# Patient Record
Sex: Female | Born: 1940
Health system: Southern US, Community
[De-identification: ages and names within clinical notes are randomized; demographics above are authoritative.]

## PROBLEM LIST (undated history)

## (undated) DIAGNOSIS — G629 Polyneuropathy, unspecified: Secondary | ICD-10-CM

## (undated) DIAGNOSIS — I10 Essential (primary) hypertension: Secondary | ICD-10-CM

## (undated) DIAGNOSIS — E559 Vitamin D deficiency, unspecified: Secondary | ICD-10-CM

## (undated) DIAGNOSIS — Z96652 Presence of left artificial knee joint: Secondary | ICD-10-CM

## (undated) DIAGNOSIS — K219 Gastro-esophageal reflux disease without esophagitis: Secondary | ICD-10-CM

## (undated) DIAGNOSIS — K5731 Diverticulosis of large intestine without perforation or abscess with bleeding: Secondary | ICD-10-CM

## (undated) DIAGNOSIS — F419 Anxiety disorder, unspecified: Secondary | ICD-10-CM

## (undated) DIAGNOSIS — G4730 Sleep apnea, unspecified: Secondary | ICD-10-CM

## (undated) DIAGNOSIS — E538 Deficiency of other specified B group vitamins: Secondary | ICD-10-CM

## (undated) DIAGNOSIS — J449 Chronic obstructive pulmonary disease, unspecified: Secondary | ICD-10-CM

## (undated) DIAGNOSIS — G2581 Restless legs syndrome: Secondary | ICD-10-CM

## (undated) DIAGNOSIS — K5790 Diverticulosis of intestine, part unspecified, without perforation or abscess without bleeding: Secondary | ICD-10-CM

## (undated) DIAGNOSIS — Z86010 Personal history of colonic polyps: Secondary | ICD-10-CM

## (undated) DIAGNOSIS — Z860101 Personal history of adenomatous and serrated colon polyps: Secondary | ICD-10-CM

## (undated) DIAGNOSIS — R7303 Prediabetes: Secondary | ICD-10-CM

## (undated) DIAGNOSIS — E785 Hyperlipidemia, unspecified: Secondary | ICD-10-CM

## (undated) HISTORY — DX: Vitamin D deficiency, unspecified: E55.9

## (undated) HISTORY — DX: Chronic obstructive pulmonary disease, unspecified: J44.9

## (undated) HISTORY — DX: Gastro-esophageal reflux disease without esophagitis: K21.9

## (undated) HISTORY — DX: Personal history of adenomatous and serrated colon polyps: Z86.0101

## (undated) HISTORY — PX: APPENDECTOMY: SHX54

## (undated) HISTORY — DX: Anxiety disorder, unspecified: F41.9

## (undated) HISTORY — PX: TONSILLECTOMY: SUR1361

## (undated) HISTORY — DX: Presence of left artificial knee joint: Z96.652

## (undated) HISTORY — DX: Hyperlipidemia, unspecified: E78.5

## (undated) HISTORY — DX: Prediabetes: R73.03

## (undated) HISTORY — DX: Deficiency of other specified B group vitamins: E53.8

## (undated) HISTORY — DX: Diverticulosis of large intestine without perforation or abscess with bleeding: K57.31

## (undated) HISTORY — DX: Personal history of colonic polyps: Z86.010

## (undated) HISTORY — DX: Restless legs syndrome: G25.81

## (undated) HISTORY — DX: Diverticulosis of intestine, part unspecified, without perforation or abscess without bleeding: K57.90

---

## 1998-01-11 ENCOUNTER — Ambulatory Visit (HOSPITAL_COMMUNITY): Admission: RE | Admit: 1998-01-11 | Discharge: 1998-01-11 | Payer: Self-pay | Admitting: Obstetrics and Gynecology

## 1998-02-08 ENCOUNTER — Other Ambulatory Visit: Admission: RE | Admit: 1998-02-08 | Discharge: 1998-02-08 | Payer: Self-pay | Admitting: *Deleted

## 1999-01-24 ENCOUNTER — Ambulatory Visit (HOSPITAL_COMMUNITY): Admission: RE | Admit: 1999-01-24 | Discharge: 1999-01-24 | Payer: Self-pay | Admitting: Internal Medicine

## 1999-01-24 ENCOUNTER — Encounter: Payer: Self-pay | Admitting: Internal Medicine

## 1999-02-21 ENCOUNTER — Other Ambulatory Visit: Admission: RE | Admit: 1999-02-21 | Discharge: 1999-02-21 | Payer: Self-pay | Admitting: *Deleted

## 2000-01-29 ENCOUNTER — Encounter: Payer: Self-pay | Admitting: Internal Medicine

## 2000-01-29 ENCOUNTER — Ambulatory Visit (HOSPITAL_COMMUNITY): Admission: RE | Admit: 2000-01-29 | Discharge: 2000-01-29 | Payer: Self-pay | Admitting: Internal Medicine

## 2000-03-19 ENCOUNTER — Other Ambulatory Visit: Admission: RE | Admit: 2000-03-19 | Discharge: 2000-03-19 | Payer: Self-pay | Admitting: *Deleted

## 2001-03-01 ENCOUNTER — Encounter: Payer: Self-pay | Admitting: Obstetrics and Gynecology

## 2001-03-01 ENCOUNTER — Ambulatory Visit (HOSPITAL_COMMUNITY): Admission: RE | Admit: 2001-03-01 | Discharge: 2001-03-01 | Payer: Self-pay | Admitting: Obstetrics and Gynecology

## 2001-03-22 ENCOUNTER — Other Ambulatory Visit: Admission: RE | Admit: 2001-03-22 | Discharge: 2001-03-22 | Payer: Self-pay | Admitting: Obstetrics and Gynecology

## 2002-03-07 ENCOUNTER — Ambulatory Visit (HOSPITAL_COMMUNITY): Admission: RE | Admit: 2002-03-07 | Discharge: 2002-03-07 | Payer: Self-pay | Admitting: Obstetrics and Gynecology

## 2002-03-07 ENCOUNTER — Encounter: Payer: Self-pay | Admitting: Obstetrics and Gynecology

## 2002-03-24 ENCOUNTER — Other Ambulatory Visit: Admission: RE | Admit: 2002-03-24 | Discharge: 2002-03-24 | Payer: Self-pay | Admitting: Obstetrics and Gynecology

## 2003-04-17 ENCOUNTER — Ambulatory Visit (HOSPITAL_COMMUNITY): Admission: RE | Admit: 2003-04-17 | Discharge: 2003-04-17 | Payer: Self-pay | Admitting: Internal Medicine

## 2003-04-24 ENCOUNTER — Other Ambulatory Visit: Admission: RE | Admit: 2003-04-24 | Discharge: 2003-04-24 | Payer: Self-pay | Admitting: Obstetrics and Gynecology

## 2004-05-29 ENCOUNTER — Ambulatory Visit (HOSPITAL_COMMUNITY): Admission: RE | Admit: 2004-05-29 | Discharge: 2004-05-29 | Payer: Self-pay | Admitting: Internal Medicine

## 2007-06-09 ENCOUNTER — Ambulatory Visit: Payer: Self-pay | Admitting: Internal Medicine

## 2007-06-24 ENCOUNTER — Ambulatory Visit: Payer: Self-pay | Admitting: Internal Medicine

## 2007-06-24 ENCOUNTER — Encounter: Payer: Self-pay | Admitting: Internal Medicine

## 2007-07-11 ENCOUNTER — Ambulatory Visit (HOSPITAL_COMMUNITY): Admission: RE | Admit: 2007-07-11 | Discharge: 2007-07-11 | Payer: Self-pay | Admitting: Internal Medicine

## 2009-02-21 ENCOUNTER — Ambulatory Visit (HOSPITAL_COMMUNITY): Admission: RE | Admit: 2009-02-21 | Discharge: 2009-02-21 | Payer: Self-pay | Admitting: Internal Medicine

## 2009-10-03 ENCOUNTER — Ambulatory Visit (HOSPITAL_COMMUNITY): Admission: RE | Admit: 2009-10-03 | Discharge: 2009-10-03 | Payer: Self-pay | Admitting: Internal Medicine

## 2010-04-09 ENCOUNTER — Ambulatory Visit (HOSPITAL_COMMUNITY)
Admission: RE | Admit: 2010-04-09 | Discharge: 2010-04-09 | Payer: Self-pay | Source: Home / Self Care | Attending: Internal Medicine | Admitting: Internal Medicine

## 2010-04-27 ENCOUNTER — Encounter: Payer: Self-pay | Admitting: Internal Medicine

## 2011-04-08 ENCOUNTER — Other Ambulatory Visit (HOSPITAL_COMMUNITY): Payer: Self-pay | Admitting: Internal Medicine

## 2011-04-08 DIAGNOSIS — Z1231 Encounter for screening mammogram for malignant neoplasm of breast: Secondary | ICD-10-CM

## 2011-04-14 DIAGNOSIS — G603 Idiopathic progressive neuropathy: Secondary | ICD-10-CM | POA: Diagnosis not present

## 2011-04-14 DIAGNOSIS — R7309 Other abnormal glucose: Secondary | ICD-10-CM | POA: Diagnosis not present

## 2011-04-14 DIAGNOSIS — I10 Essential (primary) hypertension: Secondary | ICD-10-CM | POA: Diagnosis not present

## 2011-04-29 DIAGNOSIS — M999 Biomechanical lesion, unspecified: Secondary | ICD-10-CM | POA: Diagnosis not present

## 2011-04-29 DIAGNOSIS — M5137 Other intervertebral disc degeneration, lumbosacral region: Secondary | ICD-10-CM | POA: Diagnosis not present

## 2011-05-05 ENCOUNTER — Ambulatory Visit (HOSPITAL_COMMUNITY)
Admission: RE | Admit: 2011-05-05 | Discharge: 2011-05-05 | Disposition: A | Discharge status: Home / Self Care | Payer: Medicare Other | Source: Ambulatory Visit | Attending: Internal Medicine | Admitting: Internal Medicine

## 2011-05-05 DIAGNOSIS — R7309 Other abnormal glucose: Secondary | ICD-10-CM | POA: Diagnosis not present

## 2011-05-05 DIAGNOSIS — Z1231 Encounter for screening mammogram for malignant neoplasm of breast: Secondary | ICD-10-CM | POA: Insufficient documentation

## 2011-05-05 DIAGNOSIS — E559 Vitamin D deficiency, unspecified: Secondary | ICD-10-CM | POA: Diagnosis not present

## 2011-05-05 DIAGNOSIS — Z79899 Other long term (current) drug therapy: Secondary | ICD-10-CM | POA: Diagnosis not present

## 2011-05-05 DIAGNOSIS — I10 Essential (primary) hypertension: Secondary | ICD-10-CM | POA: Diagnosis not present

## 2011-06-09 DIAGNOSIS — Z79899 Other long term (current) drug therapy: Secondary | ICD-10-CM | POA: Diagnosis not present

## 2011-06-09 DIAGNOSIS — I10 Essential (primary) hypertension: Secondary | ICD-10-CM | POA: Diagnosis not present

## 2011-07-07 DIAGNOSIS — I10 Essential (primary) hypertension: Secondary | ICD-10-CM | POA: Diagnosis not present

## 2011-07-07 DIAGNOSIS — Z79899 Other long term (current) drug therapy: Secondary | ICD-10-CM | POA: Diagnosis not present

## 2011-08-12 DIAGNOSIS — Z79899 Other long term (current) drug therapy: Secondary | ICD-10-CM | POA: Diagnosis not present

## 2011-08-12 DIAGNOSIS — I10 Essential (primary) hypertension: Secondary | ICD-10-CM | POA: Diagnosis not present

## 2011-08-12 DIAGNOSIS — R609 Edema, unspecified: Secondary | ICD-10-CM | POA: Diagnosis not present

## 2011-08-17 DIAGNOSIS — M79609 Pain in unspecified limb: Secondary | ICD-10-CM | POA: Diagnosis not present

## 2011-08-19 DIAGNOSIS — M999 Biomechanical lesion, unspecified: Secondary | ICD-10-CM | POA: Diagnosis not present

## 2011-08-19 DIAGNOSIS — M5137 Other intervertebral disc degeneration, lumbosacral region: Secondary | ICD-10-CM | POA: Diagnosis not present

## 2011-08-22 ENCOUNTER — Emergency Department (HOSPITAL_BASED_OUTPATIENT_CLINIC_OR_DEPARTMENT_OTHER)
Admission: EM | Admit: 2011-08-22 | Discharge: 2011-08-22 | Disposition: A | Discharge status: Home / Self Care | Payer: Medicare Other | Attending: Emergency Medicine | Admitting: Emergency Medicine

## 2011-08-22 ENCOUNTER — Encounter (HOSPITAL_BASED_OUTPATIENT_CLINIC_OR_DEPARTMENT_OTHER): Payer: Self-pay | Admitting: *Deleted

## 2011-08-22 ENCOUNTER — Emergency Department (HOSPITAL_BASED_OUTPATIENT_CLINIC_OR_DEPARTMENT_OTHER): Payer: Medicare Other

## 2011-08-22 DIAGNOSIS — Z79899 Other long term (current) drug therapy: Secondary | ICD-10-CM | POA: Diagnosis not present

## 2011-08-22 DIAGNOSIS — R112 Nausea with vomiting, unspecified: Secondary | ICD-10-CM | POA: Diagnosis not present

## 2011-08-22 DIAGNOSIS — I10 Essential (primary) hypertension: Secondary | ICD-10-CM | POA: Diagnosis not present

## 2011-08-22 DIAGNOSIS — R1011 Right upper quadrant pain: Secondary | ICD-10-CM | POA: Insufficient documentation

## 2011-08-22 DIAGNOSIS — R109 Unspecified abdominal pain: Secondary | ICD-10-CM

## 2011-08-22 DIAGNOSIS — E876 Hypokalemia: Secondary | ICD-10-CM | POA: Insufficient documentation

## 2011-08-22 DIAGNOSIS — F17200 Nicotine dependence, unspecified, uncomplicated: Secondary | ICD-10-CM | POA: Insufficient documentation

## 2011-08-22 HISTORY — DX: Hypomagnesemia: E83.42

## 2011-08-22 HISTORY — DX: Essential (primary) hypertension: I10

## 2011-08-22 HISTORY — DX: Polyneuropathy, unspecified: G62.9

## 2011-08-22 LAB — COMPREHENSIVE METABOLIC PANEL
Alkaline Phosphatase: 87 U/L (ref 39–117)
BUN: 10 mg/dL (ref 6–23)
Creatinine, Ser: 0.7 mg/dL (ref 0.50–1.10)
GFR calc Af Amer: >90 mL/min (ref >90)
Glucose, Bld: 86 mg/dL (ref 70–99)
Potassium: 2.3 mEq/L — CL (ref 3.5–5.1)
Total Protein: 7.2 g/dL (ref 6.0–8.3)

## 2011-08-22 LAB — CBC
HCT: 41.6 % (ref 36.0–46.0)
Hemoglobin: 14.6 g/dL (ref 12.0–15.0)
MCH: 33.1 pg (ref 26.0–34.0)
MCV: 94.3 fL (ref 78.0–100.0)
RBC: 4.41 MIL/uL (ref 3.87–5.11)

## 2011-08-22 LAB — DIFFERENTIAL
Eosinophils Absolute: 0 10*3/uL (ref 0.0–0.7)
Eosinophils Relative: 0 % (ref 0–5)
Lymphs Abs: 1.4 10*3/uL (ref 0.7–4.0)
Monocytes Absolute: 0.4 10*3/uL (ref 0.1–1.0)
Monocytes Relative: 6 % (ref 3–12)
Neutrophils Relative %: 73 % (ref 43–77)

## 2011-08-22 LAB — URINALYSIS, ROUTINE W REFLEX MICROSCOPIC
Bilirubin Urine: NEGATIVE
Ketones, ur: NEGATIVE mg/dL
Nitrite: NEGATIVE
Specific Gravity, Urine: 1.011 (ref 1.005–1.030)
Urobilinogen, UA: 1 mg/dL (ref 0.0–1.0)

## 2011-08-22 LAB — LIPASE, BLOOD: Lipase: 9 U/L — ABNORMAL LOW (ref 11–59)

## 2011-08-22 MED ORDER — POTASSIUM CHLORIDE 10 MEQ/100ML IV SOLN: 10.0000 meq | Freq: Once | INTRAVENOUS | Status: DC

## 2011-08-22 MED ORDER — ONDANSETRON HCL 4 MG PO TABS: 4.0000 mg | ORAL_TABLET | Freq: Four times a day (QID) | ORAL | Status: AC

## 2011-08-22 MED ORDER — ONDANSETRON HCL 4 MG/2ML IJ SOLN
4.0000 mg | Freq: Once | INTRAMUSCULAR | Status: AC
  Administered 2011-08-22: 4 mg via INTRAVENOUS
  Filled 2011-08-22: qty 2

## 2011-08-22 MED ORDER — GI COCKTAIL ~~LOC~~
30.0000 mL | Freq: Once | ORAL | Status: AC
  Administered 2011-08-22: 30 mL via ORAL
  Filled 2011-08-22: qty 30

## 2011-08-22 MED ORDER — POTASSIUM CHLORIDE CRYS ER 20 MEQ PO TBCR
40.0000 meq | EXTENDED_RELEASE_TABLET | Freq: Once | ORAL | Status: AC
  Administered 2011-08-22: 40 meq via ORAL
  Filled 2011-08-22: qty 2

## 2011-08-22 MED ORDER — OXYCODONE-ACETAMINOPHEN 5-325 MG PO TABS: 2.0000 | ORAL_TABLET | ORAL | Status: AC | PRN

## 2011-08-22 MED ORDER — POTASSIUM CHLORIDE 10 MEQ/100ML IV SOLN
10.0000 meq | Freq: Once | INTRAVENOUS | Status: AC
  Administered 2011-08-22: 10 meq via INTRAVENOUS
  Filled 2011-08-22: qty 100

## 2011-08-22 MED ORDER — SODIUM CHLORIDE 0.9 % IV BOLUS (SEPSIS)
1000.0000 mL | Freq: Once | INTRAVENOUS | Status: AC
  Administered 2011-08-22: 1000 mL via INTRAVENOUS

## 2011-08-22 MED ORDER — MORPHINE SULFATE 2 MG/ML IJ SOLN
2.0000 mg | Freq: Once | INTRAMUSCULAR | Status: AC
  Administered 2011-08-22: 2 mg via INTRAVENOUS
  Filled 2011-08-22: qty 1

## 2011-08-22 NOTE — ED Notes (Signed)
Pt c/o burning once taken medication in epigastric,no pain in stomach, feel raw in throat.

## 2011-08-22 NOTE — ED Notes (Signed)
rx x 2 given for zofran and percocet- family present to drive

## 2011-08-22 NOTE — Discharge Instructions (Signed)
Renee Hawkins ultrasound her gallbladder does show sludge but no stones. If you continue to have abdominal pain, nausea vomiting, return to the ER. The Zofran is for nausea. Take the Percocet for pain but do not drive with this medication. I prefer that you take the Percocet when you are ready to lay down to bed. The urine did not show infection. Your blood work does not show infection. Continue taking your prilosec.  Add maalox if pain persists.

## 2011-08-22 NOTE — ED Provider Notes (Signed)
History     CSN: [id number]  Arrival date & time 08/22/11  1331   First MD Initiated Contact with Patient 08/22/11 1414      Chief Complaint  Patient presents with  . Abdominal Pain    (Consider location/radiation/quality/duration/timing/severity/associated sxs/prior treatment) Patient is a 71 y.o. female presenting with abdominal pain. The history is provided by the patient and a relative. No language interpreter was used.  Abdominal Pain The primary symptoms of the illness include abdominal pain, nausea and vomiting. The primary symptoms of the illness do not include fever, shortness of breath, diarrhea or vaginal bleeding. The current episode started more than 2 days ago. The onset of the illness was gradual. The problem has been gradually worsening.  The illness is associated with eating. The patient has not had a change in bowel habit. Risk factors for an acute abdominal problem include being elderly. Additional symptoms associated with the illness include anorexia. Symptoms associated with the illness do not include chills, constipation, urgency, hematuria or frequency. Significant associated medical issues include cardiac disease. Significant associated medical issues do not include PUD.  Patient states that she ate a hamberger 3 days ago and that's what started her nausea vomiting and abdominal pain.  RUQ abdominal pain.  No pmh of the same.  Hesitant to get IV pain meds.  Will try a small dose of morphine.  Daughter in law at bedside.   Past Medical History  Diagnosis Date  . Hypertension   . Hypomagnesemia   . Neuropathy     Past Surgical History  Procedure Date  . Appendectomy     History reviewed. No pertinent family history.  History  Substance Use Topics  . Smoking status: Current Everyday Smoker  . Smokeless tobacco: Not on file  . Alcohol Use: No    OB History    Grav Para Term Preterm Abortions TAB SAB Ect Mult Living                  Review of Systems   Constitutional: Negative for fever and chills.  Respiratory: Negative for shortness of breath.   Gastrointestinal: Positive for nausea, vomiting, abdominal pain and anorexia. Negative for diarrhea and constipation.  Genitourinary: Negative for urgency, frequency, hematuria and vaginal bleeding.    Allergies  Review of patient's allergies indicates no known allergies.  Home Medications   Current Outpatient Rx  Name Route Sig Dispense Refill  . B COMPLEX PO TABS Oral Take 1 tablet by mouth daily.    Marland Kitchen VITAMIN D3 PO Oral Take by mouth.    Marland Kitchen VITAMIN B-12 IJ Injection Inject as directed.    Marland Kitchen GABAPENTIN 100 MG PO CAPS Oral Take 100 mg by mouth 4 (four) times daily.    Marland Kitchen HYDROCHLOROTHIAZIDE 25 MG PO TABS Oral Take 25 mg by mouth daily.    Marland Kitchen LOSARTAN POTASSIUM 25 MG PO TABS Oral Take 25 mg by mouth daily.    Marland Kitchen POTASSIUM CITRATE ER 10 MEQ (1080 MG) PO TBCR Oral Take 10 mEq by mouth 3 (three) times daily with meals.    Marland Kitchen VERAPAMIL HCL ER 240 MG PO TBCR Oral Take 240 mg by mouth at bedtime.      BP 121/81  Pulse 92  Temp(Src) 97.5 F (36.4 C) (Oral)  Resp 20  Ht 5\' 2"  (1.575 m)  Wt 98 lb (44.453 kg)  BMI 17.92 kg/m2  SpO2 97%  Physical Exam  Nursing note and vitals reviewed. Constitutional: She is oriented to person,  place, and time. She appears well-developed and well-nourished.  HENT:  Head: Normocephalic and atraumatic.  Eyes: Conjunctivae and EOM are normal. Pupils are equal, round, and reactive to light.  Neck: Normal range of motion. Neck supple.  Cardiovascular: Normal rate, regular rhythm, normal heart sounds and intact distal pulses.  Exam reveals no gallop.   No murmur heard. Pulmonary/Chest: Effort normal.  Abdominal: Soft. Bowel sounds are normal. She exhibits no distension. There is tenderness. There is no rebound and no guarding.  Musculoskeletal: Normal range of motion. She exhibits no edema and no tenderness.  Neurological: She is alert and oriented to person,  place, and time. She has normal reflexes.  Skin: Skin is warm and dry.  Psychiatric: She has a normal mood and affect.    ED Course  Procedures (including critical care time)  Labs Reviewed  COMPREHENSIVE METABOLIC PANEL - Abnormal; Notable for the following:    Sodium 130 (*)    Potassium 2.3 (*)    Chloride 83 (*)    CO2 39 (*)    Albumin 3.4 (*)    GFR calc non Af Amer 86 (*)    All other components within normal limits  LIPASE, BLOOD - Abnormal; Notable for the following:    Lipase 9 (*)    All other components within normal limits  CBC  DIFFERENTIAL  URINALYSIS, ROUTINE W REFLEX MICROSCOPIC   US Abdomen Complete  08/22/2011  *RADIOLOGY REPORT*  Clinical Data:  71 year old female with abdominal pain, nausea and vomiting.  ABDOMINAL ULTRASOUND COMPLETE  Comparison:  None  Findings:  Gallbladder: Echogenic material within the gallbladder is noted without definite shadowing.  This probably represents sludge, but nonshadowing gallstones are not excluded.  There is no evidence of gallbladder wall thickening, sonographic Murphy's sign or pericholecystic fluid.  Common Bile Duct:  There is no evidence of intrahepatic or extrahepatic biliary dilation. The CBD measures 4.7 mm in greatest diameter.  Liver:  The liver is within normal limits in parenchymal echogenicity and size.  A 1.9 x 2.7 cm cyst in the left lobe noted. No focal solid lesions are present.  IVC:  Appears normal.  Pancreas:  Although the pancreas is difficult to visualize in its entirety, no focal pancreatic abnormality is identified.  Spleen:  Within normal limits in size and echotexture.  Right kidney:  The right kidney is normal in size and parenchymal echogenicity.  There is no evidence of solid mass, hydronephrosis or definite renal calculi.  The right kidney measures 9.7 cm.  Left kidney:  The left kidney is normal in size and parenchymal echogenicity.  There is no evidence of solid mass, hydronephrosis or definite renal  calculi.   The left kidney measures 9.2 cm.  Abdominal Aorta:  No abdominal aortic aneurysm identified.  There is no evidence of ascites.  IMPRESSION: Gallbladder sludge versus nonshadowing gallstones.  No evidence of acute cholecystitis.  No other significant abnormalities identified.  Original Report Authenticated By: Rosendo Gros, M.D.     No diagnosis found.    MDM  RUQ abdominal pain with nausea and vomiting. Negative u/s sludge vs stones in gallbladder.  No acute cholecystitis.  Relief with GI cocktail.  Continue prilosec added zofran/maalox and percocet.Follow up with pcp. Hypokalemia with IV and po in the ER.          Remi Haggard, NP 08/23/11 1352

## 2011-08-22 NOTE — ED Notes (Signed)
Pt states she ate a bad hamburger Wed and vomited as a result of that. Since then abd has been sore and she feels like her food and meds are getting "stuck". Abd is tender in epigastric region.

## 2011-08-24 NOTE — ED Provider Notes (Signed)
Medical screening examination/treatment/procedure(s) were performed by non-physician practitioner and as supervising physician I was immediately available for consultation/collaboration.   Suzi Roots, MD 08/24/11 [phone]

## 2011-08-26 DIAGNOSIS — M999 Biomechanical lesion, unspecified: Secondary | ICD-10-CM | POA: Diagnosis not present

## 2011-08-26 DIAGNOSIS — M5137 Other intervertebral disc degeneration, lumbosacral region: Secondary | ICD-10-CM | POA: Diagnosis not present

## 2011-08-27 DIAGNOSIS — Z79899 Other long term (current) drug therapy: Secondary | ICD-10-CM | POA: Diagnosis not present

## 2011-09-01 ENCOUNTER — Encounter: Payer: Self-pay | Admitting: Internal Medicine

## 2011-09-04 ENCOUNTER — Encounter: Payer: Self-pay | Admitting: *Deleted

## 2011-09-09 ENCOUNTER — Encounter: Payer: Self-pay | Admitting: Internal Medicine

## 2011-09-09 DIAGNOSIS — M5137 Other intervertebral disc degeneration, lumbosacral region: Secondary | ICD-10-CM | POA: Diagnosis not present

## 2011-09-09 DIAGNOSIS — M999 Biomechanical lesion, unspecified: Secondary | ICD-10-CM | POA: Diagnosis not present

## 2011-09-11 ENCOUNTER — Ambulatory Visit (INDEPENDENT_AMBULATORY_CARE_PROVIDER_SITE_OTHER): Payer: Medicare Other | Admitting: Internal Medicine

## 2011-09-11 ENCOUNTER — Encounter: Payer: Self-pay | Admitting: Internal Medicine

## 2011-09-11 VITALS — BP 112/64 | HR 62 | Ht 62.0 in | Wt 97.0 lb

## 2011-09-11 DIAGNOSIS — R112 Nausea with vomiting, unspecified: Secondary | ICD-10-CM

## 2011-09-11 DIAGNOSIS — R932 Abnormal findings on diagnostic imaging of liver and biliary tract: Secondary | ICD-10-CM

## 2011-09-11 NOTE — Progress Notes (Signed)
Renee Hawkins 1940/09/14 MRN [id number]    History of Present Illness:  This is a 71 year old white female with an acute episode of vomiting which occurred 3 weeks ago in the evening. There was no abdominal pain, hematemesis of fever. She was taking Prilosec 20 mg a day at that time. The next day patient complained of soreness in her esophagus when she swallowed as well as burning in her stomach. She was evaluated in the emergency room with an upper abdominal ultrasound showing sludge or nonopacified gallstones with a normal looking common bile duct and gallbladder wall. She saw Dr Oneta Rack the subsequent day and was started on Zantac 300 mg in addition to Prilosec 40 mg a day. Her symptoms resolved promptly and she has been well now for over 2 weeks. She denies dysphagia, heartburn, odynophagia or abdominal pain. Her bowel habits have been regular. She has a history of significant alcohol use up until 6 months ago. She continues to smoke. There is a history of adenomatous and hyperplastic polyps of the colon on her last colonoscopy in March 2009. She will be due for a repeat colonoscopy in March 2014. Her hemoglobin recently was 13.5, hematocrit was 40.3. Her MCV was in the past elevated to 100.    Past Medical History  Diagnosis Date  . Hypertension   . Hypomagnesemia   . Neuropathy   . Diverticulosis   . Hx of adenomatous colonic polyps   . GERD (gastroesophageal reflux disease)    Past Surgical History  Procedure Date  . Appendectomy   . Tonsillectomy     reports that she has quit smoking. She has never used smokeless tobacco. She reports that she does not drink alcohol or use illicit drugs. family history includes Stroke in her maternal grandmother and mother.  There is no history of Colon cancer. No Known Allergies      Review of Systems: Currently denies any nausea vomiting dysphagia odynophagia abdominal pain  The remainder of the 10 point ROS is negative except as outlined  in H&P   Physical Exam: General appearance  Well developed, in no distress. Deeper voice due to smoking Eyes- non icteric. HEENT nontraumatic, normocephalic. Mouth no lesions, tongue papillated, no cheilosis. Neck supple without adenopathy, thyroid not enlarged, no carotid bruits, no JVD. Lungs Clear to auscultation bilaterally. Cor normal S1, normal S2, regular rhythm, no murmur,  quiet precordium. Abdomen: Soft scaphoid abdomen with normoactive bowel sounds. No focal tenderness. Liver edge at costal margin. Lower abdomen unremarkable.  Rectal: Large amount of soft Hemoccult negative stool. Extremities no pedal edema. Skin no lesions. Neurological alert and oriented x 3. Psychological normal mood and affect.   Assessment and Plan:  Problem #1 Single episode of nausea and vomiting which has subsided. The episode occurred 3 weeks ago. I suspect acute gastritis, acute gastroparesis or bowel gastroenteritis. She has improved rapidly on acid reducer reducing agents. Although she has sludge in her gallbladder radiographically, the gallbladder appeared otherwise normal without signs of inflammation. Her physical exam today is unremarkable. She denies drinking any alcohol at this time but she continues to smoke. She was initially referred for an upper endoscopy but because of complete resolution of her symptoms to having one single episode of vomiting, I feel that it would be better to wait and see how she does. She agrees with the plan. The plan will be for her to discontinue Zantac and continue the Prilosec 40 mg daily. At the end of the prescription she will return  back to 20 mg of Prilosec.   Problem #2 History of colon polyps. She will be due for a repeat colonoscopy March 2014. If her upper GI symptoms continue or recur, I would do the upper endoscopy and colonoscopy at the same time.   09/11/2011 Renee Hawkins

## 2011-09-11 NOTE — Patient Instructions (Signed)
Please follow up with Dr Juanda Chance as needed. CC: Dr Lucky Cowboy

## 2011-09-21 DIAGNOSIS — M79609 Pain in unspecified limb: Secondary | ICD-10-CM | POA: Diagnosis not present

## 2011-09-21 DIAGNOSIS — I8310 Varicose veins of unspecified lower extremity with inflammation: Secondary | ICD-10-CM | POA: Diagnosis not present

## 2011-10-07 DIAGNOSIS — H2510 Age-related nuclear cataract, unspecified eye: Secondary | ICD-10-CM | POA: Diagnosis not present

## 2011-10-19 DIAGNOSIS — I10 Essential (primary) hypertension: Secondary | ICD-10-CM | POA: Diagnosis not present

## 2011-10-19 DIAGNOSIS — E559 Vitamin D deficiency, unspecified: Secondary | ICD-10-CM | POA: Diagnosis not present

## 2011-10-19 DIAGNOSIS — R7309 Other abnormal glucose: Secondary | ICD-10-CM | POA: Diagnosis not present

## 2011-10-19 DIAGNOSIS — Z1212 Encounter for screening for malignant neoplasm of rectum: Secondary | ICD-10-CM | POA: Diagnosis not present

## 2011-10-19 DIAGNOSIS — E782 Mixed hyperlipidemia: Secondary | ICD-10-CM | POA: Diagnosis not present

## 2011-10-19 DIAGNOSIS — Z79899 Other long term (current) drug therapy: Secondary | ICD-10-CM | POA: Diagnosis not present

## 2011-11-10 DIAGNOSIS — R918 Other nonspecific abnormal finding of lung field: Secondary | ICD-10-CM | POA: Diagnosis not present

## 2011-11-10 DIAGNOSIS — I6529 Occlusion and stenosis of unspecified carotid artery: Secondary | ICD-10-CM | POA: Diagnosis not present

## 2011-11-10 DIAGNOSIS — R4182 Altered mental status, unspecified: Secondary | ICD-10-CM | POA: Diagnosis not present

## 2011-11-10 DIAGNOSIS — R569 Unspecified convulsions: Secondary | ICD-10-CM | POA: Diagnosis not present

## 2011-11-10 DIAGNOSIS — F17200 Nicotine dependence, unspecified, uncomplicated: Secondary | ICD-10-CM | POA: Diagnosis not present

## 2011-11-10 DIAGNOSIS — R413 Other amnesia: Secondary | ICD-10-CM | POA: Diagnosis not present

## 2011-11-10 DIAGNOSIS — R911 Solitary pulmonary nodule: Secondary | ICD-10-CM | POA: Diagnosis present

## 2011-11-10 DIAGNOSIS — R079 Chest pain, unspecified: Secondary | ICD-10-CM | POA: Diagnosis not present

## 2011-11-10 DIAGNOSIS — R259 Unspecified abnormal involuntary movements: Secondary | ICD-10-CM | POA: Diagnosis not present

## 2011-11-10 DIAGNOSIS — F3289 Other specified depressive episodes: Secondary | ICD-10-CM | POA: Diagnosis not present

## 2011-11-10 DIAGNOSIS — I10 Essential (primary) hypertension: Secondary | ICD-10-CM | POA: Diagnosis not present

## 2011-11-10 DIAGNOSIS — G459 Transient cerebral ischemic attack, unspecified: Secondary | ICD-10-CM | POA: Diagnosis not present

## 2011-11-10 DIAGNOSIS — G9341 Metabolic encephalopathy: Secondary | ICD-10-CM | POA: Diagnosis not present

## 2011-11-10 DIAGNOSIS — J441 Chronic obstructive pulmonary disease with (acute) exacerbation: Secondary | ICD-10-CM | POA: Diagnosis not present

## 2011-11-10 DIAGNOSIS — R404 Transient alteration of awareness: Secondary | ICD-10-CM | POA: Diagnosis not present

## 2011-11-10 DIAGNOSIS — J90 Pleural effusion, not elsewhere classified: Secondary | ICD-10-CM | POA: Diagnosis not present

## 2011-11-10 DIAGNOSIS — R0602 Shortness of breath: Secondary | ICD-10-CM | POA: Diagnosis not present

## 2011-11-10 DIAGNOSIS — J9600 Acute respiratory failure, unspecified whether with hypoxia or hypercapnia: Secondary | ICD-10-CM | POA: Diagnosis not present

## 2011-11-10 DIAGNOSIS — G589 Mononeuropathy, unspecified: Secondary | ICD-10-CM | POA: Diagnosis present

## 2011-11-10 DIAGNOSIS — F329 Major depressive disorder, single episode, unspecified: Secondary | ICD-10-CM | POA: Diagnosis present

## 2011-11-10 DIAGNOSIS — R6889 Other general symptoms and signs: Secondary | ICD-10-CM | POA: Diagnosis not present

## 2011-11-10 DIAGNOSIS — I6350 Cerebral infarction due to unspecified occlusion or stenosis of unspecified cerebral artery: Secondary | ICD-10-CM | POA: Diagnosis not present

## 2011-11-10 DIAGNOSIS — K219 Gastro-esophageal reflux disease without esophagitis: Secondary | ICD-10-CM | POA: Diagnosis not present

## 2011-11-10 DIAGNOSIS — A0472 Enterocolitis due to Clostridium difficile, not specified as recurrent: Secondary | ICD-10-CM | POA: Diagnosis not present

## 2011-11-10 DIAGNOSIS — J9610 Chronic respiratory failure, unspecified whether with hypoxia or hypercapnia: Secondary | ICD-10-CM | POA: Diagnosis not present

## 2011-11-10 DIAGNOSIS — E1142 Type 2 diabetes mellitus with diabetic polyneuropathy: Secondary | ICD-10-CM | POA: Diagnosis not present

## 2011-11-10 DIAGNOSIS — I059 Rheumatic mitral valve disease, unspecified: Secondary | ICD-10-CM | POA: Diagnosis not present

## 2011-11-10 DIAGNOSIS — N179 Acute kidney failure, unspecified: Secondary | ICD-10-CM | POA: Diagnosis not present

## 2011-11-19 DIAGNOSIS — J90 Pleural effusion, not elsewhere classified: Secondary | ICD-10-CM | POA: Diagnosis not present

## 2011-11-19 DIAGNOSIS — R918 Other nonspecific abnormal finding of lung field: Secondary | ICD-10-CM | POA: Diagnosis not present

## 2011-11-19 DIAGNOSIS — R079 Chest pain, unspecified: Secondary | ICD-10-CM | POA: Diagnosis not present

## 2011-11-19 DIAGNOSIS — R52 Pain, unspecified: Secondary | ICD-10-CM | POA: Diagnosis present

## 2011-11-19 DIAGNOSIS — J9819 Other pulmonary collapse: Secondary | ICD-10-CM | POA: Diagnosis not present

## 2011-11-19 DIAGNOSIS — F39 Unspecified mood [affective] disorder: Secondary | ICD-10-CM | POA: Diagnosis present

## 2011-11-19 DIAGNOSIS — R911 Solitary pulmonary nodule: Secondary | ICD-10-CM | POA: Diagnosis present

## 2011-11-19 DIAGNOSIS — R569 Unspecified convulsions: Secondary | ICD-10-CM | POA: Diagnosis not present

## 2011-11-19 DIAGNOSIS — G589 Mononeuropathy, unspecified: Secondary | ICD-10-CM | POA: Diagnosis present

## 2011-11-19 DIAGNOSIS — J449 Chronic obstructive pulmonary disease, unspecified: Secondary | ICD-10-CM | POA: Diagnosis present

## 2011-11-19 DIAGNOSIS — F329 Major depressive disorder, single episode, unspecified: Secondary | ICD-10-CM | POA: Diagnosis present

## 2011-11-19 DIAGNOSIS — G609 Hereditary and idiopathic neuropathy, unspecified: Secondary | ICD-10-CM | POA: Diagnosis not present

## 2011-11-19 DIAGNOSIS — R404 Transient alteration of awareness: Secondary | ICD-10-CM | POA: Diagnosis present

## 2011-11-19 DIAGNOSIS — N189 Chronic kidney disease, unspecified: Secondary | ICD-10-CM | POA: Diagnosis not present

## 2011-11-19 DIAGNOSIS — Z87891 Personal history of nicotine dependence: Secondary | ICD-10-CM | POA: Diagnosis not present

## 2011-11-19 DIAGNOSIS — J441 Chronic obstructive pulmonary disease with (acute) exacerbation: Secondary | ICD-10-CM | POA: Diagnosis not present

## 2011-11-19 DIAGNOSIS — E876 Hypokalemia: Secondary | ICD-10-CM | POA: Diagnosis not present

## 2011-11-19 DIAGNOSIS — Z8673 Personal history of transient ischemic attack (TIA), and cerebral infarction without residual deficits: Secondary | ICD-10-CM | POA: Diagnosis not present

## 2011-11-19 DIAGNOSIS — I10 Essential (primary) hypertension: Secondary | ICD-10-CM | POA: Diagnosis not present

## 2011-11-19 DIAGNOSIS — R059 Cough, unspecified: Secondary | ICD-10-CM | POA: Diagnosis not present

## 2011-11-19 DIAGNOSIS — R0602 Shortness of breath: Secondary | ICD-10-CM | POA: Diagnosis not present

## 2011-11-19 DIAGNOSIS — F17200 Nicotine dependence, unspecified, uncomplicated: Secondary | ICD-10-CM | POA: Diagnosis present

## 2011-11-19 DIAGNOSIS — E539 Vitamin B deficiency, unspecified: Secondary | ICD-10-CM | POA: Diagnosis present

## 2011-11-19 DIAGNOSIS — A0472 Enterocolitis due to Clostridium difficile, not specified as recurrent: Secondary | ICD-10-CM | POA: Diagnosis present

## 2011-11-19 DIAGNOSIS — E871 Hypo-osmolality and hyponatremia: Secondary | ICD-10-CM | POA: Diagnosis not present

## 2011-11-19 DIAGNOSIS — R05 Cough: Secondary | ICD-10-CM | POA: Diagnosis not present

## 2011-11-19 DIAGNOSIS — IMO0002 Reserved for concepts with insufficient information to code with codable children: Secondary | ICD-10-CM | POA: Diagnosis present

## 2011-11-19 DIAGNOSIS — J9600 Acute respiratory failure, unspecified whether with hypoxia or hypercapnia: Secondary | ICD-10-CM | POA: Diagnosis not present

## 2011-11-24 DIAGNOSIS — J9600 Acute respiratory failure, unspecified whether with hypoxia or hypercapnia: Secondary | ICD-10-CM | POA: Diagnosis not present

## 2011-11-24 DIAGNOSIS — N189 Chronic kidney disease, unspecified: Secondary | ICD-10-CM | POA: Diagnosis not present

## 2011-11-24 DIAGNOSIS — K219 Gastro-esophageal reflux disease without esophagitis: Secondary | ICD-10-CM | POA: Diagnosis not present

## 2011-11-24 DIAGNOSIS — R569 Unspecified convulsions: Secondary | ICD-10-CM | POA: Diagnosis not present

## 2011-11-24 DIAGNOSIS — J449 Chronic obstructive pulmonary disease, unspecified: Secondary | ICD-10-CM | POA: Diagnosis not present

## 2011-11-24 DIAGNOSIS — F39 Unspecified mood [affective] disorder: Secondary | ICD-10-CM | POA: Diagnosis present

## 2011-11-24 DIAGNOSIS — R5381 Other malaise: Secondary | ICD-10-CM | POA: Diagnosis not present

## 2011-11-24 DIAGNOSIS — E1142 Type 2 diabetes mellitus with diabetic polyneuropathy: Secondary | ICD-10-CM | POA: Diagnosis not present

## 2011-11-24 DIAGNOSIS — A0472 Enterocolitis due to Clostridium difficile, not specified as recurrent: Secondary | ICD-10-CM | POA: Diagnosis present

## 2011-11-24 DIAGNOSIS — R404 Transient alteration of awareness: Secondary | ICD-10-CM | POA: Diagnosis present

## 2011-11-24 DIAGNOSIS — I10 Essential (primary) hypertension: Secondary | ICD-10-CM | POA: Diagnosis not present

## 2011-11-24 DIAGNOSIS — R52 Pain, unspecified: Secondary | ICD-10-CM | POA: Diagnosis present

## 2011-11-24 DIAGNOSIS — F17200 Nicotine dependence, unspecified, uncomplicated: Secondary | ICD-10-CM | POA: Diagnosis present

## 2011-11-24 DIAGNOSIS — E876 Hypokalemia: Secondary | ICD-10-CM | POA: Diagnosis present

## 2011-11-24 DIAGNOSIS — L89109 Pressure ulcer of unspecified part of back, unspecified stage: Secondary | ICD-10-CM | POA: Diagnosis not present

## 2011-11-24 DIAGNOSIS — J4489 Other specified chronic obstructive pulmonary disease: Secondary | ICD-10-CM | POA: Diagnosis not present

## 2011-11-24 DIAGNOSIS — IMO0002 Reserved for concepts with insufficient information to code with codable children: Secondary | ICD-10-CM | POA: Diagnosis present

## 2011-11-24 DIAGNOSIS — E539 Vitamin B deficiency, unspecified: Secondary | ICD-10-CM | POA: Diagnosis present

## 2011-11-24 DIAGNOSIS — G589 Mononeuropathy, unspecified: Secondary | ICD-10-CM | POA: Diagnosis present

## 2011-11-24 DIAGNOSIS — E871 Hypo-osmolality and hyponatremia: Secondary | ICD-10-CM | POA: Diagnosis present

## 2011-11-24 DIAGNOSIS — J9610 Chronic respiratory failure, unspecified whether with hypoxia or hypercapnia: Secondary | ICD-10-CM | POA: Diagnosis not present

## 2011-11-27 DIAGNOSIS — R5381 Other malaise: Secondary | ICD-10-CM | POA: Diagnosis not present

## 2011-11-27 DIAGNOSIS — I10 Essential (primary) hypertension: Secondary | ICD-10-CM | POA: Diagnosis not present

## 2011-11-27 DIAGNOSIS — R569 Unspecified convulsions: Secondary | ICD-10-CM | POA: Diagnosis not present

## 2011-11-27 DIAGNOSIS — E1142 Type 2 diabetes mellitus with diabetic polyneuropathy: Secondary | ICD-10-CM | POA: Diagnosis not present

## 2011-12-01 DIAGNOSIS — L89109 Pressure ulcer of unspecified part of back, unspecified stage: Secondary | ICD-10-CM | POA: Diagnosis not present

## 2011-12-24 DIAGNOSIS — K219 Gastro-esophageal reflux disease without esophagitis: Secondary | ICD-10-CM | POA: Diagnosis not present

## 2011-12-24 DIAGNOSIS — J9610 Chronic respiratory failure, unspecified whether with hypoxia or hypercapnia: Secondary | ICD-10-CM | POA: Diagnosis not present

## 2011-12-24 DIAGNOSIS — J449 Chronic obstructive pulmonary disease, unspecified: Secondary | ICD-10-CM | POA: Diagnosis not present

## 2011-12-24 DIAGNOSIS — I10 Essential (primary) hypertension: Secondary | ICD-10-CM | POA: Diagnosis not present

## 2012-01-14 DIAGNOSIS — D649 Anemia, unspecified: Secondary | ICD-10-CM | POA: Diagnosis not present

## 2012-01-14 DIAGNOSIS — Z23 Encounter for immunization: Secondary | ICD-10-CM | POA: Diagnosis not present

## 2012-01-14 DIAGNOSIS — D518 Other vitamin B12 deficiency anemias: Secondary | ICD-10-CM | POA: Diagnosis not present

## 2012-01-14 DIAGNOSIS — I10 Essential (primary) hypertension: Secondary | ICD-10-CM | POA: Diagnosis not present

## 2012-01-14 DIAGNOSIS — Z79899 Other long term (current) drug therapy: Secondary | ICD-10-CM | POA: Diagnosis not present

## 2012-01-14 DIAGNOSIS — N3000 Acute cystitis without hematuria: Secondary | ICD-10-CM | POA: Diagnosis not present

## 2012-01-14 DIAGNOSIS — E559 Vitamin D deficiency, unspecified: Secondary | ICD-10-CM | POA: Diagnosis not present

## 2012-01-18 DIAGNOSIS — I10 Essential (primary) hypertension: Secondary | ICD-10-CM | POA: Diagnosis not present

## 2012-01-18 DIAGNOSIS — D649 Anemia, unspecified: Secondary | ICD-10-CM | POA: Diagnosis not present

## 2012-01-18 DIAGNOSIS — F070 Personality change due to known physiological condition: Secondary | ICD-10-CM | POA: Diagnosis not present

## 2012-01-18 DIAGNOSIS — F329 Major depressive disorder, single episode, unspecified: Secondary | ICD-10-CM | POA: Diagnosis not present

## 2012-01-18 DIAGNOSIS — G40909 Epilepsy, unspecified, not intractable, without status epilepticus: Secondary | ICD-10-CM | POA: Diagnosis not present

## 2012-01-18 DIAGNOSIS — N3000 Acute cystitis without hematuria: Secondary | ICD-10-CM | POA: Diagnosis not present

## 2012-01-18 DIAGNOSIS — G609 Hereditary and idiopathic neuropathy, unspecified: Secondary | ICD-10-CM | POA: Diagnosis not present

## 2012-01-18 DIAGNOSIS — Z8673 Personal history of transient ischemic attack (TIA), and cerebral infarction without residual deficits: Secondary | ICD-10-CM | POA: Diagnosis not present

## 2012-01-18 DIAGNOSIS — J449 Chronic obstructive pulmonary disease, unspecified: Secondary | ICD-10-CM | POA: Diagnosis not present

## 2012-01-18 DIAGNOSIS — Z79899 Other long term (current) drug therapy: Secondary | ICD-10-CM | POA: Diagnosis not present

## 2012-01-19 DIAGNOSIS — G40909 Epilepsy, unspecified, not intractable, without status epilepticus: Secondary | ICD-10-CM | POA: Diagnosis not present

## 2012-01-19 DIAGNOSIS — F329 Major depressive disorder, single episode, unspecified: Secondary | ICD-10-CM | POA: Diagnosis not present

## 2012-01-19 DIAGNOSIS — I10 Essential (primary) hypertension: Secondary | ICD-10-CM | POA: Diagnosis not present

## 2012-01-19 DIAGNOSIS — Z8673 Personal history of transient ischemic attack (TIA), and cerebral infarction without residual deficits: Secondary | ICD-10-CM | POA: Diagnosis not present

## 2012-01-19 DIAGNOSIS — G609 Hereditary and idiopathic neuropathy, unspecified: Secondary | ICD-10-CM | POA: Diagnosis not present

## 2012-01-19 DIAGNOSIS — J449 Chronic obstructive pulmonary disease, unspecified: Secondary | ICD-10-CM | POA: Diagnosis not present

## 2012-01-21 DIAGNOSIS — J449 Chronic obstructive pulmonary disease, unspecified: Secondary | ICD-10-CM | POA: Diagnosis not present

## 2012-01-21 DIAGNOSIS — G40909 Epilepsy, unspecified, not intractable, without status epilepticus: Secondary | ICD-10-CM | POA: Diagnosis not present

## 2012-01-21 DIAGNOSIS — I10 Essential (primary) hypertension: Secondary | ICD-10-CM | POA: Diagnosis not present

## 2012-01-21 DIAGNOSIS — Z8673 Personal history of transient ischemic attack (TIA), and cerebral infarction without residual deficits: Secondary | ICD-10-CM | POA: Diagnosis not present

## 2012-01-21 DIAGNOSIS — G609 Hereditary and idiopathic neuropathy, unspecified: Secondary | ICD-10-CM | POA: Diagnosis not present

## 2012-01-21 DIAGNOSIS — F329 Major depressive disorder, single episode, unspecified: Secondary | ICD-10-CM | POA: Diagnosis not present

## 2012-01-22 DIAGNOSIS — J449 Chronic obstructive pulmonary disease, unspecified: Secondary | ICD-10-CM | POA: Diagnosis not present

## 2012-01-26 DIAGNOSIS — F329 Major depressive disorder, single episode, unspecified: Secondary | ICD-10-CM | POA: Diagnosis not present

## 2012-01-26 DIAGNOSIS — Z8673 Personal history of transient ischemic attack (TIA), and cerebral infarction without residual deficits: Secondary | ICD-10-CM | POA: Diagnosis not present

## 2012-01-26 DIAGNOSIS — G40909 Epilepsy, unspecified, not intractable, without status epilepticus: Secondary | ICD-10-CM | POA: Diagnosis not present

## 2012-01-26 DIAGNOSIS — G609 Hereditary and idiopathic neuropathy, unspecified: Secondary | ICD-10-CM | POA: Diagnosis not present

## 2012-01-26 DIAGNOSIS — I10 Essential (primary) hypertension: Secondary | ICD-10-CM | POA: Diagnosis not present

## 2012-01-26 DIAGNOSIS — J449 Chronic obstructive pulmonary disease, unspecified: Secondary | ICD-10-CM | POA: Diagnosis not present

## 2012-02-02 DIAGNOSIS — G609 Hereditary and idiopathic neuropathy, unspecified: Secondary | ICD-10-CM | POA: Diagnosis not present

## 2012-02-02 DIAGNOSIS — F329 Major depressive disorder, single episode, unspecified: Secondary | ICD-10-CM | POA: Diagnosis not present

## 2012-02-02 DIAGNOSIS — G40909 Epilepsy, unspecified, not intractable, without status epilepticus: Secondary | ICD-10-CM | POA: Diagnosis not present

## 2012-02-02 DIAGNOSIS — I10 Essential (primary) hypertension: Secondary | ICD-10-CM | POA: Diagnosis not present

## 2012-02-02 DIAGNOSIS — J449 Chronic obstructive pulmonary disease, unspecified: Secondary | ICD-10-CM | POA: Diagnosis not present

## 2012-02-02 DIAGNOSIS — Z8673 Personal history of transient ischemic attack (TIA), and cerebral infarction without residual deficits: Secondary | ICD-10-CM | POA: Diagnosis not present

## 2012-02-09 DIAGNOSIS — I10 Essential (primary) hypertension: Secondary | ICD-10-CM | POA: Diagnosis not present

## 2012-02-09 DIAGNOSIS — Z8673 Personal history of transient ischemic attack (TIA), and cerebral infarction without residual deficits: Secondary | ICD-10-CM | POA: Diagnosis not present

## 2012-02-09 DIAGNOSIS — G40909 Epilepsy, unspecified, not intractable, without status epilepticus: Secondary | ICD-10-CM | POA: Diagnosis not present

## 2012-02-09 DIAGNOSIS — G609 Hereditary and idiopathic neuropathy, unspecified: Secondary | ICD-10-CM | POA: Diagnosis not present

## 2012-02-09 DIAGNOSIS — J449 Chronic obstructive pulmonary disease, unspecified: Secondary | ICD-10-CM | POA: Diagnosis not present

## 2012-02-09 DIAGNOSIS — F329 Major depressive disorder, single episode, unspecified: Secondary | ICD-10-CM | POA: Diagnosis not present

## 2012-02-16 DIAGNOSIS — I10 Essential (primary) hypertension: Secondary | ICD-10-CM | POA: Diagnosis not present

## 2012-02-16 DIAGNOSIS — G609 Hereditary and idiopathic neuropathy, unspecified: Secondary | ICD-10-CM | POA: Diagnosis not present

## 2012-02-16 DIAGNOSIS — G40909 Epilepsy, unspecified, not intractable, without status epilepticus: Secondary | ICD-10-CM | POA: Diagnosis not present

## 2012-02-16 DIAGNOSIS — Z8673 Personal history of transient ischemic attack (TIA), and cerebral infarction without residual deficits: Secondary | ICD-10-CM | POA: Diagnosis not present

## 2012-02-16 DIAGNOSIS — F329 Major depressive disorder, single episode, unspecified: Secondary | ICD-10-CM | POA: Diagnosis not present

## 2012-02-16 DIAGNOSIS — J449 Chronic obstructive pulmonary disease, unspecified: Secondary | ICD-10-CM | POA: Diagnosis not present

## 2012-02-23 DIAGNOSIS — J449 Chronic obstructive pulmonary disease, unspecified: Secondary | ICD-10-CM | POA: Diagnosis not present

## 2012-02-23 DIAGNOSIS — G40909 Epilepsy, unspecified, not intractable, without status epilepticus: Secondary | ICD-10-CM | POA: Diagnosis not present

## 2012-02-23 DIAGNOSIS — G609 Hereditary and idiopathic neuropathy, unspecified: Secondary | ICD-10-CM | POA: Diagnosis not present

## 2012-02-23 DIAGNOSIS — I10 Essential (primary) hypertension: Secondary | ICD-10-CM | POA: Diagnosis not present

## 2012-02-23 DIAGNOSIS — F329 Major depressive disorder, single episode, unspecified: Secondary | ICD-10-CM | POA: Diagnosis not present

## 2012-02-23 DIAGNOSIS — Z8673 Personal history of transient ischemic attack (TIA), and cerebral infarction without residual deficits: Secondary | ICD-10-CM | POA: Diagnosis not present

## 2012-03-02 DIAGNOSIS — J449 Chronic obstructive pulmonary disease, unspecified: Secondary | ICD-10-CM | POA: Diagnosis not present

## 2012-03-02 DIAGNOSIS — R0902 Hypoxemia: Secondary | ICD-10-CM | POA: Diagnosis not present

## 2012-03-02 DIAGNOSIS — G479 Sleep disorder, unspecified: Secondary | ICD-10-CM | POA: Diagnosis not present

## 2012-03-02 DIAGNOSIS — R911 Solitary pulmonary nodule: Secondary | ICD-10-CM | POA: Diagnosis not present

## 2012-03-07 DIAGNOSIS — E782 Mixed hyperlipidemia: Secondary | ICD-10-CM | POA: Diagnosis not present

## 2012-03-07 DIAGNOSIS — Z79899 Other long term (current) drug therapy: Secondary | ICD-10-CM | POA: Diagnosis not present

## 2012-03-07 DIAGNOSIS — E559 Vitamin D deficiency, unspecified: Secondary | ICD-10-CM | POA: Diagnosis not present

## 2012-03-07 DIAGNOSIS — I10 Essential (primary) hypertension: Secondary | ICD-10-CM | POA: Diagnosis not present

## 2012-03-07 DIAGNOSIS — R7309 Other abnormal glucose: Secondary | ICD-10-CM | POA: Diagnosis not present

## 2012-03-15 DIAGNOSIS — I10 Essential (primary) hypertension: Secondary | ICD-10-CM | POA: Diagnosis not present

## 2012-03-15 DIAGNOSIS — G40909 Epilepsy, unspecified, not intractable, without status epilepticus: Secondary | ICD-10-CM | POA: Diagnosis not present

## 2012-03-15 DIAGNOSIS — G609 Hereditary and idiopathic neuropathy, unspecified: Secondary | ICD-10-CM | POA: Diagnosis not present

## 2012-03-15 DIAGNOSIS — Z8673 Personal history of transient ischemic attack (TIA), and cerebral infarction without residual deficits: Secondary | ICD-10-CM | POA: Diagnosis not present

## 2012-03-15 DIAGNOSIS — F329 Major depressive disorder, single episode, unspecified: Secondary | ICD-10-CM | POA: Diagnosis not present

## 2012-03-15 DIAGNOSIS — J449 Chronic obstructive pulmonary disease, unspecified: Secondary | ICD-10-CM | POA: Diagnosis not present

## 2012-04-21 DIAGNOSIS — G479 Sleep disorder, unspecified: Secondary | ICD-10-CM | POA: Diagnosis not present

## 2012-04-21 DIAGNOSIS — R0902 Hypoxemia: Secondary | ICD-10-CM | POA: Diagnosis not present

## 2012-05-02 DIAGNOSIS — R0902 Hypoxemia: Secondary | ICD-10-CM | POA: Diagnosis not present

## 2012-05-02 DIAGNOSIS — R911 Solitary pulmonary nodule: Secondary | ICD-10-CM | POA: Diagnosis not present

## 2012-05-02 DIAGNOSIS — G479 Sleep disorder, unspecified: Secondary | ICD-10-CM | POA: Diagnosis not present

## 2012-05-02 DIAGNOSIS — G2581 Restless legs syndrome: Secondary | ICD-10-CM | POA: Diagnosis not present

## 2012-05-02 DIAGNOSIS — J449 Chronic obstructive pulmonary disease, unspecified: Secondary | ICD-10-CM | POA: Diagnosis not present

## 2012-05-09 DIAGNOSIS — Z79899 Other long term (current) drug therapy: Secondary | ICD-10-CM | POA: Diagnosis not present

## 2012-05-09 DIAGNOSIS — R7309 Other abnormal glucose: Secondary | ICD-10-CM | POA: Diagnosis not present

## 2012-05-09 DIAGNOSIS — E782 Mixed hyperlipidemia: Secondary | ICD-10-CM | POA: Diagnosis not present

## 2012-05-09 DIAGNOSIS — E538 Deficiency of other specified B group vitamins: Secondary | ICD-10-CM | POA: Diagnosis not present

## 2012-05-09 DIAGNOSIS — I10 Essential (primary) hypertension: Secondary | ICD-10-CM | POA: Diagnosis not present

## 2012-05-09 DIAGNOSIS — D649 Anemia, unspecified: Secondary | ICD-10-CM | POA: Diagnosis not present

## 2012-05-26 DIAGNOSIS — R911 Solitary pulmonary nodule: Secondary | ICD-10-CM | POA: Diagnosis not present

## 2012-05-26 DIAGNOSIS — J438 Other emphysema: Secondary | ICD-10-CM | POA: Diagnosis not present

## 2012-05-26 DIAGNOSIS — Z09 Encounter for follow-up examination after completed treatment for conditions other than malignant neoplasm: Secondary | ICD-10-CM | POA: Diagnosis not present

## 2012-06-07 DIAGNOSIS — J449 Chronic obstructive pulmonary disease, unspecified: Secondary | ICD-10-CM | POA: Diagnosis not present

## 2012-06-08 DIAGNOSIS — J449 Chronic obstructive pulmonary disease, unspecified: Secondary | ICD-10-CM | POA: Diagnosis not present

## 2012-06-08 DIAGNOSIS — I10 Essential (primary) hypertension: Secondary | ICD-10-CM | POA: Diagnosis not present

## 2012-06-13 ENCOUNTER — Encounter: Payer: Self-pay | Admitting: Internal Medicine

## 2012-06-17 DIAGNOSIS — R0902 Hypoxemia: Secondary | ICD-10-CM | POA: Diagnosis not present

## 2012-06-17 DIAGNOSIS — R911 Solitary pulmonary nodule: Secondary | ICD-10-CM | POA: Diagnosis not present

## 2012-06-17 DIAGNOSIS — J449 Chronic obstructive pulmonary disease, unspecified: Secondary | ICD-10-CM | POA: Diagnosis not present

## 2012-08-16 ENCOUNTER — Ambulatory Visit (HOSPITAL_COMMUNITY)
Admission: RE | Admit: 2012-08-16 | Discharge: 2012-08-16 | Disposition: A | Discharge status: Home / Self Care | Payer: Medicare Other | Source: Ambulatory Visit | Attending: Physician Assistant | Admitting: Physician Assistant

## 2012-08-16 ENCOUNTER — Other Ambulatory Visit (HOSPITAL_COMMUNITY): Payer: Self-pay | Admitting: Physician Assistant

## 2012-08-16 DIAGNOSIS — M5137 Other intervertebral disc degeneration, lumbosacral region: Secondary | ICD-10-CM | POA: Diagnosis not present

## 2012-08-16 DIAGNOSIS — I700 Atherosclerosis of aorta: Secondary | ICD-10-CM | POA: Diagnosis not present

## 2012-08-16 DIAGNOSIS — M545 Low back pain, unspecified: Secondary | ICD-10-CM | POA: Insufficient documentation

## 2012-08-16 DIAGNOSIS — N3000 Acute cystitis without hematuria: Secondary | ICD-10-CM | POA: Diagnosis not present

## 2012-08-16 DIAGNOSIS — G609 Hereditary and idiopathic neuropathy, unspecified: Secondary | ICD-10-CM | POA: Insufficient documentation

## 2012-08-16 DIAGNOSIS — K8020 Calculus of gallbladder without cholecystitis without obstruction: Secondary | ICD-10-CM | POA: Diagnosis not present

## 2012-08-16 DIAGNOSIS — R5381 Other malaise: Secondary | ICD-10-CM | POA: Diagnosis not present

## 2012-08-16 DIAGNOSIS — G9589 Other specified diseases of spinal cord: Secondary | ICD-10-CM | POA: Insufficient documentation

## 2012-08-16 DIAGNOSIS — M51379 Other intervertebral disc degeneration, lumbosacral region without mention of lumbar back pain or lower extremity pain: Secondary | ICD-10-CM | POA: Insufficient documentation

## 2012-08-16 DIAGNOSIS — D539 Nutritional anemia, unspecified: Secondary | ICD-10-CM | POA: Diagnosis not present

## 2012-08-16 DIAGNOSIS — M47817 Spondylosis without myelopathy or radiculopathy, lumbosacral region: Secondary | ICD-10-CM | POA: Diagnosis not present

## 2012-08-16 DIAGNOSIS — R209 Unspecified disturbances of skin sensation: Secondary | ICD-10-CM | POA: Diagnosis not present

## 2012-08-16 DIAGNOSIS — Z79899 Other long term (current) drug therapy: Secondary | ICD-10-CM | POA: Diagnosis not present

## 2012-10-18 DIAGNOSIS — E782 Mixed hyperlipidemia: Secondary | ICD-10-CM | POA: Diagnosis not present

## 2012-10-18 DIAGNOSIS — E559 Vitamin D deficiency, unspecified: Secondary | ICD-10-CM | POA: Diagnosis not present

## 2012-10-18 DIAGNOSIS — Z1212 Encounter for screening for malignant neoplasm of rectum: Secondary | ICD-10-CM | POA: Diagnosis not present

## 2012-10-18 DIAGNOSIS — Z79899 Other long term (current) drug therapy: Secondary | ICD-10-CM | POA: Diagnosis not present

## 2012-10-18 DIAGNOSIS — I10 Essential (primary) hypertension: Secondary | ICD-10-CM | POA: Diagnosis not present

## 2012-10-18 DIAGNOSIS — R7309 Other abnormal glucose: Secondary | ICD-10-CM | POA: Diagnosis not present

## 2012-10-18 DIAGNOSIS — E538 Deficiency of other specified B group vitamins: Secondary | ICD-10-CM | POA: Diagnosis not present

## 2012-11-08 DIAGNOSIS — J449 Chronic obstructive pulmonary disease, unspecified: Secondary | ICD-10-CM | POA: Diagnosis not present

## 2012-11-08 DIAGNOSIS — R0902 Hypoxemia: Secondary | ICD-10-CM | POA: Diagnosis not present

## 2012-11-08 DIAGNOSIS — R911 Solitary pulmonary nodule: Secondary | ICD-10-CM | POA: Diagnosis not present

## 2012-12-14 DIAGNOSIS — J449 Chronic obstructive pulmonary disease, unspecified: Secondary | ICD-10-CM | POA: Diagnosis not present

## 2012-12-14 DIAGNOSIS — R0902 Hypoxemia: Secondary | ICD-10-CM | POA: Diagnosis not present

## 2012-12-14 DIAGNOSIS — R911 Solitary pulmonary nodule: Secondary | ICD-10-CM | POA: Diagnosis not present

## 2012-12-23 DIAGNOSIS — J449 Chronic obstructive pulmonary disease, unspecified: Secondary | ICD-10-CM | POA: Diagnosis not present

## 2013-01-04 ENCOUNTER — Other Ambulatory Visit (HOSPITAL_COMMUNITY): Payer: Self-pay | Admitting: Internal Medicine

## 2013-01-04 DIAGNOSIS — M810 Age-related osteoporosis without current pathological fracture: Secondary | ICD-10-CM

## 2013-01-04 DIAGNOSIS — Z1231 Encounter for screening mammogram for malignant neoplasm of breast: Secondary | ICD-10-CM

## 2013-01-18 ENCOUNTER — Ambulatory Visit (HOSPITAL_COMMUNITY)
Admission: RE | Admit: 2013-01-18 | Discharge: 2013-01-18 | Disposition: A | Discharge status: Home / Self Care | Payer: Medicare Other | Source: Ambulatory Visit | Attending: Internal Medicine | Admitting: Internal Medicine

## 2013-01-18 DIAGNOSIS — M810 Age-related osteoporosis without current pathological fracture: Secondary | ICD-10-CM | POA: Diagnosis not present

## 2013-01-18 DIAGNOSIS — Z1231 Encounter for screening mammogram for malignant neoplasm of breast: Secondary | ICD-10-CM | POA: Diagnosis not present

## 2013-01-18 DIAGNOSIS — Z79899 Other long term (current) drug therapy: Secondary | ICD-10-CM | POA: Diagnosis not present

## 2013-01-18 DIAGNOSIS — R7309 Other abnormal glucose: Secondary | ICD-10-CM | POA: Diagnosis not present

## 2013-01-18 DIAGNOSIS — E559 Vitamin D deficiency, unspecified: Secondary | ICD-10-CM | POA: Diagnosis not present

## 2013-01-18 DIAGNOSIS — Z780 Asymptomatic menopausal state: Secondary | ICD-10-CM | POA: Insufficient documentation

## 2013-01-18 DIAGNOSIS — E538 Deficiency of other specified B group vitamins: Secondary | ICD-10-CM | POA: Diagnosis not present

## 2013-01-18 DIAGNOSIS — E782 Mixed hyperlipidemia: Secondary | ICD-10-CM | POA: Diagnosis not present

## 2013-01-18 DIAGNOSIS — Z13820 Encounter for screening for osteoporosis: Secondary | ICD-10-CM | POA: Insufficient documentation

## 2013-01-18 DIAGNOSIS — I10 Essential (primary) hypertension: Secondary | ICD-10-CM | POA: Diagnosis not present

## 2013-01-18 DIAGNOSIS — D649 Anemia, unspecified: Secondary | ICD-10-CM | POA: Diagnosis not present

## 2013-01-18 DIAGNOSIS — Z23 Encounter for immunization: Secondary | ICD-10-CM | POA: Diagnosis not present

## 2013-03-20 DIAGNOSIS — R0902 Hypoxemia: Secondary | ICD-10-CM | POA: Diagnosis not present

## 2013-03-20 DIAGNOSIS — R911 Solitary pulmonary nodule: Secondary | ICD-10-CM | POA: Diagnosis not present

## 2013-03-20 DIAGNOSIS — J449 Chronic obstructive pulmonary disease, unspecified: Secondary | ICD-10-CM | POA: Diagnosis not present

## 2013-04-20 ENCOUNTER — Ambulatory Visit: Payer: Self-pay | Admitting: Internal Medicine

## 2013-05-06 DIAGNOSIS — E559 Vitamin D deficiency, unspecified: Secondary | ICD-10-CM | POA: Insufficient documentation

## 2013-05-06 DIAGNOSIS — R7309 Other abnormal glucose: Secondary | ICD-10-CM | POA: Insufficient documentation

## 2013-05-06 DIAGNOSIS — F419 Anxiety disorder, unspecified: Secondary | ICD-10-CM | POA: Insufficient documentation

## 2013-05-06 DIAGNOSIS — Z86010 Personal history of colonic polyps: Secondary | ICD-10-CM | POA: Insufficient documentation

## 2013-05-06 DIAGNOSIS — G2581 Restless legs syndrome: Secondary | ICD-10-CM | POA: Insufficient documentation

## 2013-05-06 DIAGNOSIS — E782 Mixed hyperlipidemia: Secondary | ICD-10-CM | POA: Insufficient documentation

## 2013-05-06 DIAGNOSIS — K219 Gastro-esophageal reflux disease without esophagitis: Secondary | ICD-10-CM | POA: Insufficient documentation

## 2013-05-06 DIAGNOSIS — E538 Deficiency of other specified B group vitamins: Secondary | ICD-10-CM | POA: Insufficient documentation

## 2013-05-06 DIAGNOSIS — I10 Essential (primary) hypertension: Secondary | ICD-10-CM | POA: Insufficient documentation

## 2013-05-07 NOTE — Patient Instructions (Addendum)
Hypertension As your heart beats, it forces blood through your arteries. This force is your blood pressure. If the pressure is too high, it is called hypertension (HTN) or high blood pressure. HTN is dangerous because you may have it and not know it. High blood pressure may mean that your heart has to work harder to pump blood. Your arteries may be narrow or stiff. The extra work puts you at risk for heart disease, stroke, and other problems.  Blood pressure consists of two numbers, a higher number over a lower, 110/72, for example. It is stated as "110 over 72." The ideal is below 120 for the top number (systolic) and under 80 for the bottom (diastolic). Write down your blood pressure today. You should pay close attention to your blood pressure if you have certain conditions such as:  Heart failure.  Prior heart attack.  Diabetes  Chronic kidney disease.  Prior stroke.  Multiple risk factors for heart disease. To see if you have HTN, your blood pressure should be measured while you are seated with your arm held at the level of the heart. It should be measured at least twice. A one-time elevated blood pressure reading (especially in the Emergency Department) does not mean that you need treatment. There may be conditions in which the blood pressure is different between your right and left arms. It is important to see your caregiver soon for a recheck. Most people have essential hypertension which means that there is not a specific cause. This type of high blood pressure may be lowered by changing lifestyle factors such as:  Stress.  Smoking.  Lack of exercise.  Excessive weight.  Drug/tobacco/alcohol use.  Eating less salt. Most people do not have symptoms from high blood pressure until it has caused damage to the body. Effective treatment can often prevent, delay or reduce that damage. TREATMENT  When a cause has been identified, treatment for high blood pressure is directed at the  cause. There are a large number of medications to treat HTN. These fall into several categories, and your caregiver will help you select the medicines that are best for you. Medications may have side effects. You should review side effects with your caregiver. If your blood pressure stays high after you have made lifestyle changes or started on medicines,   Your medication(s) may need to be changed.  Other problems may need to be addressed.  Be certain you understand your prescriptions, and know how and when to take your medicine.  Be sure to follow up with your caregiver within the time frame advised (usually within two weeks) to have your blood pressure rechecked and to review your medications.  If you are taking more than one medicine to lower your blood pressure, make sure you know how and at what times they should be taken. Taking two medicines at the same time can result in blood pressure that is too low. SEEK IMMEDIATE MEDICAL CARE IF:  You develop a severe headache, blurred or changing vision, or confusion.  You have unusual weakness or numbness, or a faint feeling.  You have severe chest or abdominal pain, vomiting, or breathing problems. MAKE SURE YOU:   Understand these instructions.  Will watch your condition.  Will get help right away if you are not doing well or get worse.   Diabetes and Exercise Exercising regularly is important. It is not just about losing weight. It has many health benefits, such as:  Improving your overall fitness, flexibility, and endurance.  Increasing your bone density.  Helping with weight control.  Decreasing your body fat.  Increasing your muscle strength.  Reducing stress and tension.  Improving your overall health. People with diabetes who exercise gain additional benefits because exercise:  Reduces appetite.  Improves the body's use of blood sugar (glucose).  Helps lower or control blood glucose.  Decreases blood  pressure.  Helps control blood lipids (such as cholesterol and triglycerides).  Improves the body's use of the hormone insulin by:  Increasing the body's insulin sensitivity.  Reducing the body's insulin needs.  Decreases the risk for heart disease because exercising:  Lowers cholesterol and triglycerides levels.  Increases the levels of good cholesterol (such as high-density lipoproteins [HDL]) in the body.  Lowers blood glucose levels. YOUR ACTIVITY PLAN  Choose an activity that you enjoy and set realistic goals. Your health care provider or diabetes educator can help you make an activity plan that works for you. You can break activities into 2 or 3 sessions throughout the day. Doing so is as good as one long session. Exercise ideas include:  Taking the dog for a walk.  Taking the stairs instead of the elevator.  Dancing to your favorite song.  Doing your favorite exercise with a friend. RECOMMENDATIONS FOR EXERCISING WITH TYPE 1 OR TYPE 2 DIABETES   Check your blood glucose before exercising. If blood glucose levels are greater than 240 mg/dL, check for urine ketones. Do not exercise if ketones are present.  Avoid injecting insulin into areas of the body that are going to be exercised. For example, avoid injecting insulin into:  The arms when playing tennis.  The legs when jogging.  Keep a record of:  Food intake before and after you exercise.  Expected peak times of insulin action.  Blood glucose levels before and after you exercise.  The type and amount of exercise you have done.  Review your records with your health care provider. Your health care provider will help you to develop guidelines for adjusting food intake and insulin amounts before and after exercising.  If you take insulin or oral hypoglycemic agents, watch for signs and symptoms of hypoglycemia. They include:  Dizziness.  Shaking.  Sweating.  Chills.  Confusion.  Drink plenty of water  while you exercise to prevent dehydration or heat stroke. Body water is lost during exercise and must be replaced.  Talk to your health care provider before starting an exercise program to make sure it is safe for you. Remember, almost any type of activity is better than none.    Cholesterol Cholesterol is a white, waxy, fat-like protein needed by your body in small amounts. The liver makes all the cholesterol you need. It is carried from the liver by the blood through the blood vessels. Deposits (plaque) may build up on blood vessel walls. This makes the arteries narrower and stiffer. Plaque increases the risk for heart attack and stroke. You cannot feel your cholesterol level even if it is very high. The only way to know is by a blood test to check your lipid (fats) levels. Once you know your cholesterol levels, you should keep a record of the test results. Work with your caregiver to to keep your levels in the desired range. WHAT THE RESULTS MEAN:  Total cholesterol is a rough measure of all the cholesterol in your blood.  LDL is the so-called bad cholesterol. This is the type that deposits cholesterol in the walls of the arteries. You want this level to  be low.  HDL is the good cholesterol because it cleans the arteries and carries the LDL away. You want this level to be high.  Triglycerides are fat that the body can either burn for energy or store. High levels are closely linked to heart disease. DESIRED LEVELS:  Total cholesterol below 200.  LDL below 100 for people at risk, below 70 for very high risk.  HDL above 50 is good, above 60 is best.  Triglycerides below 150. HOW TO LOWER YOUR CHOLESTEROL:  Diet.  Choose fish or white meat chicken and [place], roasted or baked. Limit fatty cuts of red meat, fried foods, and processed meats, such as sausage and lunch meat.  Eat lots of fresh fruits and vegetables. Choose whole grains, beans, pasta, potatoes and cereals.  Use only  small amounts of olive, corn or canola oils. Avoid butter, mayonnaise, shortening or palm kernel oils. Avoid foods with trans-fats.  Use skim/nonfat milk and low-fat/nonfat yogurt and cheeses. Avoid whole milk, cream, ice cream, egg yolks and cheeses. Healthy desserts include angel food cake, ginger snaps, animal crackers, hard candy, popsicles, and low-fat/nonfat frozen yogurt. Avoid pastries, cakes, pies and cookies.  Exercise.  A regular program helps decrease LDL and raises HDL.  Helps with weight control.  Do things that increase your activity level like gardening, walking, or taking the stairs.  Medication.  May be prescribed by your caregiver to help lowering cholesterol and the risk for heart disease.  You may need medicine even if your levels are normal if you have several risk factors. HOME CARE INSTRUCTIONS   Follow your diet and exercise programs as suggested by your caregiver.  Take medications as directed.  Have blood work done when your caregiver feels it is necessary. MAKE SURE YOU:   Understand these instructions.  Will watch your condition.  Will get help right away if you are not doing well or get worse.      Vitamin D Deficiency Vitamin D is an important vitamin that your body needs. Having too little of it in your body is called a deficiency. A very bad deficiency can make your bones soft and can cause a condition called rickets.  Vitamin D is important to your body for different reasons, such as:   It helps your body absorb 2 minerals called calcium and phosphorus.  It helps make your bones healthy.  It may prevent some diseases, such as diabetes and multiple sclerosis.  It helps your muscles and heart. You can get vitamin D in several ways. It is a natural part of some foods. The vitamin is also added to some dairy products and cereals. Some people take vitamin D supplements. Also, your body makes vitamin D when you are in the sun. It changes the  sun's rays into a form of the vitamin that your body can use. CAUSES   Not eating enough foods that contain vitamin D.  Not getting enough sunlight.  Having certain digestive system diseases that make it hard to absorb vitamin D. These diseases include Crohn's disease, chronic pancreatitis, and cystic fibrosis.  Having a surgery in which part of the stomach or small intestine is removed.  Being obese. Fat cells pull vitamin D out of your blood. That means that obese people may not have enough vitamin D left in their blood and in other body tissues.  Having chronic kidney or liver disease. RISK FACTORS Risk factors are things that make you more likely to develop a vitamin D deficiency.  They include:  Being older.  Not being able to get outside very much.  Living in a nursing home.  Having had broken bones.  Having weak or thin bones (osteoporosis).  Having a disease or condition that changes how your body absorbs vitamin D.  Having dark skin.  Some medicines such as seizure medicines or steroids.  Being overweight or obese. SYMPTOMS Mild cases of vitamin D deficiency may not have any symptoms. If you have a very bad case, symptoms may include:  Bone pain.  Muscle pain.  Falling often.  Broken bones caused by a minor injury, due to osteoporosis. DIAGNOSIS A blood test is the best way to tell if you have a vitamin D deficiency. TREATMENT Vitamin D deficiency can be treated in different ways. Treatment for vitamin D deficiency depends on what is causing it. Options include:  Taking vitamin D supplements.  Taking a calcium supplement. Your caregiver will suggest what dose is best for you. HOME CARE INSTRUCTIONS  Take any supplements that your caregiver prescribes. Follow the directions carefully. Take only the suggested amount.  Have your blood tested 2 months after you start taking supplements.  Eat foods that contain vitamin D. Healthy choices  include:  Fortified dairy products, cereals, or juices. Fortified means vitamin D has been added to the food. Check the label on the package to be sure.  Fatty fish like salmon or trout.  Eggs.  Oysters.  Do not use a tanning bed.  Keep your weight at a healthy level. Lose weight if you need to.  Keep all follow-up appointments. Your caregiver will need to perform blood tests to make sure your vitamin D deficiency is going away. SEEK MEDICAL CARE IF:  You have any questions about your treatment.  You continue to have symptoms of vitamin D deficiency.  You have nausea or vomiting.  You are constipated.  You feel confused.  You have severe abdominal or back pain. MAKE SURE YOU:  Understand these instructions.  Will watch your condition.  Will get help right away if you are not doing well or get worse.    Vitamin B12 Deficiency Not having enough vitamin B12 is called a deficiency. Vitamin B12 is an important vitamin. Your body needs vitamin B12 to:   Make red blood cells.  Make DNA. This is the genetic material inside all of your cells.  Help your nerves work properly so they can carry messages from your brain to your body. CAUSES  Not eating enough foods that contain vitamin B12.  Not having enough stomach acid and digestive juices. The body needs these to absorb vitamin B12 from the food you eat.  Having certain digestive system diseases that make it hard to absorb vitamin B12. These diseases include Crohn's disease, chronic pancreatitis, and cystic fibrosis.  Having pernicious anemia, which is a condition where the body has too few red blood cells. People with this condition do not make enough of a protein called "intrinsic factor," which is needed to absorb vitamin B12.  Having a surgery in which part of the stomach or small intestine is removed.  Taking certain medicines that make it hard for the body to absorb vitamin B12. These medicines  include:  Heartburn medicine (antacids and proton pump inhibitors).  A certain antibiotic medicine called neomycin, which fights infection.  Some medicines used to treat diabetes, tuberculosis, gout, and high cholesterol. RISK FACTORS Risk factors are things that make you more likely to develop a vitamin B12 deficiency. They  include:  Being older than 77.  Being a vegetarian.  Being pregnant and a vegetarian or having a poor diet.  Taking certain drugs.  Being an alcoholic. SYMPTOMS You may have a vitamin B12 deficiency with no symptoms. However, a vitamin B12 deficiency can cause health problems like anemia and nerve damage. These health problems can lead to many possible symptoms, including:  Weakness.  Fatigue.  Loss of appetite.  Weight loss.  Numbness or tingling in your hands and feet.  Redness and burning of the tongue.  Confusion or memory problems.  Depression.  Dizziness.  Sensory problems, such as loss of taste, color blindness, and ringing in the ears.  Diarrhea or constipation.  Trouble walking. DIAGNOSIS Various types of tests can be given to help find the cause of your vitamin B12 deficiency. These tests include:  A complete blood count (CBC). This test gives your caregiver an overall picture of what makes up your blood.  A blood test to measure your B12 level.  A blood test to measure intrinsic factor.  An endoscopy. This procedure uses a thin tube with a camera on the end to look into your stomach or intestines. TREATMENT Treatment for vitamin B12 deficiency depends on what is causing it. Common options include:  Changing your eating and drinking habits, such as:  Eating more foods that contain vitamin B12.  Not drinking as much alcohol or any alcohol.  Taking vitamin B12 supplements. Your caregiver will tell you what dose is best for you.  Getting vitamin B12 injections. Some people get these a few times a week. Others get them once  a month. HOME CARE INSTRUCTIONS  Take all supplements as directed by your caregiver. Follow the directions carefully.  Get any injections your caregiver prescribes. Do not miss your appointments.  Eat lots of healthy foods that contain vitamin B12. Ask your caregiver if you should work with a nutritionist. Good things to include in your diet are:  Meat.  Poultry.  Fish.  Eggs.  Fortified cereal and dairy products. This means vitamin B12 has been added to the food. Check the label on the package to be sure.  Do not abuse alcohol.  Keep all follow-up appointments. Your caregiver will need to perform blood tests to make sure your vitamin B12 deficiency is going away. SEEK MEDICAL CARE IF:  You have any questions about your treatment.  Your symptoms come back. MAKE SURE YOU:  Understand these instructions.  Will watch your condition.  Will get help right away if you are not doing well or get worse. Document Released: 06/15/2011 Document Reviewed: 06/15/2011 [facility] Patient Information 2014 [employer].

## 2013-05-07 NOTE — Progress Notes (Signed)
Patient ID: Renee Hawkins, female   DOB: [birth year], 73 y.o.   MRN: [id number]  This very nice 73 y.o. [facility] presents for 6 month follow up with Hypertension, Hyperlipidemia, Pre-Diabetes and Vitamin D Deficiency.    HTN predates since 56. BP has been controlled at home. Today's BP: 122/66 mmHg . Patient denies any cardiac type chest pain, palpitations, dyspnea/orthopnea/PND, dizziness, claudication, or dependent edema. She did have her diuretics stopped last year because of ongoing problems with hypokalemia and hypomagnesemia. She also has COPD and stopped smoking last March during a post hospitalization NH rehab of pneumonia and at that time required O2.   Hyperlipidemia is controlled with diet & meds. Last Cholesterol was 144, Triglycerides were 161, HDL 48 and LDL 64 in July 2014. Patient denies myalgias or other med SE's.    Also, the patient is monitored for PreDiabetes with last A1c of  5.5% in July 2014. Patient denies any symptoms of reactive hypoglycemia, diabetic polys, paresthesias or visual blurring.   Further, Patient has history of Vitamin D Deficiency of 44 in 2009 with last vitamin D of 100 in July 2014. Patient supplements vitamin D without any suspected side-effects. She also has Hx/o B12 Deficiency and had B12 injections stopped at last visit because of supranormal levels and continues on B-complex drops.    Medication List         atorvastatin 80 MG tablet  Commonly known as:  LIPITOR  Take 80 mg by mouth daily.     b complex vitamins tablet  Take 1 tablet by mouth daily.     beclomethasone 80 MCG/ACT inhaler  Commonly known as:  QVAR  Inhale 2 puffs into the lungs 2 (two) times daily.     Cholecalciferol 4000 UNITS Caps  Take 1 capsule by mouth daily.     citalopram 10 MG tablet  Commonly known as:  CELEXA  Take 10 mg by mouth daily.     gabapentin 100 MG capsule  Commonly known as:  NEURONTIN  Take 100 mg by mouth 4 (four) times daily.     Magnesium 300 MG  Caps  Take 1 capsule by mouth daily.     omeprazole 40 MG capsule  Commonly known as:  PRILOSEC  Take 40 mg by mouth daily.     potassium citrate 10 MEQ (1080 MG) SR tablet  Commonly known as:  UROCIT-K  Take 10 mEq by mouth 2 (two) times daily.     SLOW RELEASE IRON 45 MG Tbcr  Generic drug:  Ferrous Sulfate Dried  Take by mouth daily.     tiotropium 18 MCG inhalation capsule  Commonly known as:  SPIRIVA  Place 18 mcg into inhaler and inhale daily.     verapamil 240 MG CR tablet  Commonly known as:  CALAN-SR  Take 240 mg by mouth at bedtime.         Allergies  Allergen Reactions  . Atenolol   . Clonazepam   . Gabapentin   . Levaquin [Levofloxacin In D5w]   . Vasotec [Enalapril]     PMHx:   Past Medical History  Diagnosis Date  . Hypomagnesemia   . Neuropathy   . Diverticulosis   . Hx of adenomatous colonic polyps   . Hypertension   . GERD (gastroesophageal reflux disease)   . Hyperlipidemia   . Prediabetes   . Vitamin D deficiency   . RLS (restless legs syndrome)   . Vitamin B12 deficiency   . Anxiety  FHx:    Reviewed / unchanged  SHx:    Reviewed / unchanged  Systems Review:  Constitutional: Denies fever, chills, wt changes, headaches, insomnia, fatigue, night sweats, change in appetite. Eyes: Denies redness, blurred vision, diplopia, discharge, itchy, watery eyes.  ENT: Denies discharge, congestion, post nasal drip, epistaxis, sore throat, earache, hearing loss, dental pain, tinnitus, vertigo, sinus pain, snoring.  CV: Denies chest pain, palpitations, irregular heartbeat, syncope, dyspnea, diaphoresis, orthopnea, PND, claudication, edema. Respiratory: denies cough, dyspnea, DOE, pleurisy, hoarseness, laryngitis, wheezing.  Gastrointestinal: Denies dysphagia, odynophagia, heartburn, reflux, water brash, abdominal pain or cramps, nausea, vomiting, bloating, diarrhea, constipation, hematemesis, melena, hematochezia,  or hemorrhoids. Genitourinary:  Denies dysuria, frequency, urgency, nocturia, hesitancy, discharge, hematuria, flank pain. Musculoskeletal: Denies arthralgias, myalgias, stiffness, jt. swelling, pain, limp, strain/sprain.  Skin: Denies pruritus, rash, hives, warts, acne, eczema, change in skin lesion(s). Neuro: No weakness, tremor, incoordination, spasms, paresthesia, or pain. Psychiatric: Denies confusion, memory loss, or sensory loss. Endo: Denies change in weight, skin, hair change.  Heme/Lymph: No excessive bleeding, bruising, orenlarged lymph nodes.  BP: 122/66  Pulse: 92  Temp: 97.9 F (36.6 C)  Resp: 16                                O2 sat = 97 % on Rm Air tyoday    Estimated body mass index is 22.38 kg/(m^2) as calculated from the following:   Height as of 09/11/11: 5\' 2"  (1.575 m).   Weight as of this encounter: 122 lb 6.4 oz (55.52 kg).  On Exam: Appears well nourished - in no distress. Eyes: PERRLA, EOMs, conjunctiva no swelling or erythema. Sinuses: No frontal/maxillary tenderness ENT/Mouth: EAC's clear, TM's nl w/o erythema, bulging. Nares clear w/o erythema, swelling, exudates. Oropharynx clear without erythema or exudates. Oral hygiene is good. Tongue normal, non obstructing. Hearing intact.  Neck: Supple. Thyroid nl. Car 2+/2+ without bruits, nodes or JVD. Chest: Respirations nl with BS clear & equal w/o rales, rhonchi, wheezing or stridor.  Cor: Heart sounds normal w/ regular rate and rhythm without sig. murmurs, gallops, clicks, or rubs. Peripheral pulses normal and equal  without edema.  Abdomen: Soft & bowel sounds normal. Non-tender w/o guarding, rebound, hernias, masses, or organomegaly.  Lymphatics: Unremarkable.  Musculoskeletal: Full ROM all peripheral extremities, joint stability, 5/5 strength, and normal gait.  Skin: Warm, dry without exposed rashes, lesions, ecchymosis apparent.  Neuro: Cranial nerves intact, reflexes equal bilaterally. Sensory-motor testing grossly intact. Tendon reflexes  grossly intact.  Pysch: Alert & oriented x 3. Insight and judgement nl & appropriate. No ideations.  Assessment and Plan:  1. Hypertension - Continue monitor blood pressure at home. Continue diet/meds same.  2. Hyperlipidemia - Continue diet/meds, exercise,& lifestyle modifications. Continue monitor periodic cholesterol/liver & renal functions   3. Pre-diabetes Screening - Continue diet, exercise, lifestyle modifications. Monitor appropriate labs.  4. Vitamin D Deficiency - Continue supplementation.  5. COPD  Recommended regular exercise, BP monitoring, weight control, and discussed med and SE's. Recommended labs to assess and monitor clinical status. Further disposition pending results of labs.

## 2013-05-08 ENCOUNTER — Encounter: Payer: Self-pay | Admitting: Internal Medicine

## 2013-05-08 ENCOUNTER — Ambulatory Visit (INDEPENDENT_AMBULATORY_CARE_PROVIDER_SITE_OTHER): Payer: Medicare Other | Admitting: Internal Medicine

## 2013-05-08 ENCOUNTER — Ambulatory Visit: Payer: Self-pay | Admitting: Internal Medicine

## 2013-05-08 VITALS — BP 122/66 | HR 92 | Temp 97.9°F | Resp 16 | Wt 122.4 lb

## 2013-05-08 DIAGNOSIS — E559 Vitamin D deficiency, unspecified: Secondary | ICD-10-CM

## 2013-05-08 DIAGNOSIS — E782 Mixed hyperlipidemia: Secondary | ICD-10-CM | POA: Diagnosis not present

## 2013-05-08 DIAGNOSIS — E785 Hyperlipidemia, unspecified: Secondary | ICD-10-CM

## 2013-05-08 DIAGNOSIS — E538 Deficiency of other specified B group vitamins: Secondary | ICD-10-CM | POA: Diagnosis not present

## 2013-05-08 DIAGNOSIS — R7309 Other abnormal glucose: Secondary | ICD-10-CM | POA: Diagnosis not present

## 2013-05-08 DIAGNOSIS — I10 Essential (primary) hypertension: Secondary | ICD-10-CM | POA: Diagnosis not present

## 2013-05-08 DIAGNOSIS — R7303 Prediabetes: Secondary | ICD-10-CM

## 2013-05-09 LAB — BASIC METABOLIC PANEL WITH GFR
BUN: 11 mg/dL (ref 6–23)
CHLORIDE: 98 meq/L (ref 96–112)
CO2: 32 mEq/L (ref 19–32)
Calcium: 9.6 mg/dL (ref 8.4–10.5)
Creat: 0.75 mg/dL (ref 0.50–1.10)
GFR, Est Non African American: 81 mL/min
GLUCOSE: 84 mg/dL (ref 70–99)
POTASSIUM: 4.2 meq/L (ref 3.5–5.3)
SODIUM: 141 meq/L (ref 135–145)

## 2013-05-09 LAB — LIPID PANEL
CHOLESTEROL: 180 mg/dL (ref 0–200)
HDL: 49 mg/dL (ref >39)
LDL Cholesterol: 102 mg/dL — ABNORMAL HIGH (ref 0–99)
TRIGLYCERIDES: 146 mg/dL (ref <150)
Total CHOL/HDL Ratio: 3.7 Ratio
VLDL: 29 mg/dL (ref 0–40)

## 2013-05-09 LAB — CBC WITH DIFFERENTIAL/PLATELET
BASOS PCT: 0 % (ref 0–1)
Basophils Absolute: 0 10*3/uL (ref 0.0–0.1)
Eosinophils Absolute: 0.1 10*3/uL (ref 0.0–0.7)
Eosinophils Relative: 1 % (ref 0–5)
HEMATOCRIT: 39 % (ref 36.0–46.0)
HEMOGLOBIN: 13.2 g/dL (ref 12.0–15.0)
Lymphocytes Relative: 32 % (ref 12–46)
Lymphs Abs: 2.2 10*3/uL (ref 0.7–4.0)
MCH: 30.6 pg (ref 26.0–34.0)
MCHC: 33.8 g/dL (ref 30.0–36.0)
MCV: 90.5 fL (ref 78.0–100.0)
MONO ABS: 0.4 10*3/uL (ref 0.1–1.0)
MONOS PCT: 5 % (ref 3–12)
NEUTROS ABS: 4.2 10*3/uL (ref 1.7–7.7)
NEUTROS PCT: 62 % (ref 43–77)
Platelets: 226 10*3/uL (ref 150–400)
RBC: 4.31 MIL/uL (ref 3.87–5.11)
RDW: 13.7 % (ref 11.5–15.5)
WBC: 6.9 10*3/uL (ref 4.0–10.5)

## 2013-05-09 LAB — HEPATIC FUNCTION PANEL
ALK PHOS: 122 U/L — AB (ref 39–117)
ALT: 14 U/L (ref 0–35)
AST: 18 U/L (ref 0–37)
Albumin: 4.2 g/dL (ref 3.5–5.2)
BILIRUBIN DIRECT: 0.1 mg/dL (ref 0.0–0.3)
BILIRUBIN INDIRECT: 0.4 mg/dL (ref 0.2–1.2)
Total Bilirubin: 0.5 mg/dL (ref 0.2–1.2)
Total Protein: 7 g/dL (ref 6.0–8.3)

## 2013-05-09 LAB — VITAMIN D 25 HYDROXY (VIT D DEFICIENCY, FRACTURES)

## 2013-05-09 LAB — TSH: TSH: 1.495 u[IU]/mL (ref 0.350–4.500)

## 2013-05-09 LAB — INSULIN, FASTING: INSULIN FASTING, SERUM: 21 u[IU]/mL (ref 3–28)

## 2013-05-09 LAB — HEMOGLOBIN A1C
Hgb A1c MFr Bld: 5.7 % — ABNORMAL HIGH (ref <5.7)
MEAN PLASMA GLUCOSE: 117 mg/dL — AB (ref <117)

## 2013-05-09 LAB — MAGNESIUM: Magnesium: 1.7 mg/dL (ref 1.5–2.5)

## 2013-05-09 LAB — VITAMIN B12

## 2013-05-13 ENCOUNTER — Other Ambulatory Visit: Payer: Self-pay | Admitting: Internal Medicine

## 2013-06-07 DIAGNOSIS — M5137 Other intervertebral disc degeneration, lumbosacral region: Secondary | ICD-10-CM | POA: Diagnosis not present

## 2013-06-07 DIAGNOSIS — M999 Biomechanical lesion, unspecified: Secondary | ICD-10-CM | POA: Diagnosis not present

## 2013-06-10 ENCOUNTER — Other Ambulatory Visit: Payer: Self-pay | Admitting: Internal Medicine

## 2013-06-26 DIAGNOSIS — N951 Menopausal and female climacteric states: Secondary | ICD-10-CM | POA: Diagnosis not present

## 2013-06-26 DIAGNOSIS — R1900 Intra-abdominal and pelvic swelling, mass and lump, unspecified site: Secondary | ICD-10-CM | POA: Diagnosis not present

## 2013-06-26 DIAGNOSIS — Z01419 Encounter for gynecological examination (general) (routine) without abnormal findings: Secondary | ICD-10-CM | POA: Diagnosis not present

## 2013-06-26 DIAGNOSIS — Z124 Encounter for screening for malignant neoplasm of cervix: Secondary | ICD-10-CM | POA: Diagnosis not present

## 2013-07-03 DIAGNOSIS — J449 Chronic obstructive pulmonary disease, unspecified: Secondary | ICD-10-CM | POA: Diagnosis not present

## 2013-07-03 DIAGNOSIS — R911 Solitary pulmonary nodule: Secondary | ICD-10-CM | POA: Diagnosis not present

## 2013-07-04 DIAGNOSIS — N951 Menopausal and female climacteric states: Secondary | ICD-10-CM | POA: Diagnosis not present

## 2013-07-04 DIAGNOSIS — R1900 Intra-abdominal and pelvic swelling, mass and lump, unspecified site: Secondary | ICD-10-CM | POA: Diagnosis not present

## 2013-07-05 DIAGNOSIS — M5137 Other intervertebral disc degeneration, lumbosacral region: Secondary | ICD-10-CM | POA: Diagnosis not present

## 2013-07-05 DIAGNOSIS — M999 Biomechanical lesion, unspecified: Secondary | ICD-10-CM | POA: Diagnosis not present

## 2013-07-06 DIAGNOSIS — G4734 Idiopathic sleep related nonobstructive alveolar hypoventilation: Secondary | ICD-10-CM | POA: Diagnosis not present

## 2013-07-06 DIAGNOSIS — R911 Solitary pulmonary nodule: Secondary | ICD-10-CM | POA: Diagnosis not present

## 2013-07-06 DIAGNOSIS — J449 Chronic obstructive pulmonary disease, unspecified: Secondary | ICD-10-CM | POA: Diagnosis not present

## 2013-07-17 ENCOUNTER — Encounter: Payer: Self-pay | Admitting: Internal Medicine

## 2013-08-09 ENCOUNTER — Ambulatory Visit: Payer: Self-pay | Admitting: Physician Assistant

## 2013-08-14 ENCOUNTER — Other Ambulatory Visit: Payer: Self-pay | Admitting: *Deleted

## 2013-08-14 DIAGNOSIS — H2510 Age-related nuclear cataract, unspecified eye: Secondary | ICD-10-CM | POA: Diagnosis not present

## 2013-08-14 MED ORDER — LORAZEPAM 2 MG PO TABS: 2.0000 mg | ORAL_TABLET | Freq: Every evening | ORAL | Status: DC | PRN

## 2013-08-16 ENCOUNTER — Other Ambulatory Visit: Payer: Self-pay | Admitting: Internal Medicine

## 2013-08-21 ENCOUNTER — Ambulatory Visit (INDEPENDENT_AMBULATORY_CARE_PROVIDER_SITE_OTHER): Payer: Medicare Other | Admitting: Physician Assistant

## 2013-08-21 ENCOUNTER — Encounter: Payer: Self-pay | Admitting: Physician Assistant

## 2013-08-21 VITALS — BP 120/62 | HR 84 | Temp 98.6°F | Resp 16 | Wt 124.0 lb

## 2013-08-21 DIAGNOSIS — Z0000 Encounter for general adult medical examination without abnormal findings: Secondary | ICD-10-CM | POA: Diagnosis not present

## 2013-08-21 DIAGNOSIS — K219 Gastro-esophageal reflux disease without esophagitis: Secondary | ICD-10-CM

## 2013-08-21 DIAGNOSIS — G2581 Restless legs syndrome: Secondary | ICD-10-CM

## 2013-08-21 DIAGNOSIS — G629 Polyneuropathy, unspecified: Secondary | ICD-10-CM

## 2013-08-21 DIAGNOSIS — R7309 Other abnormal glucose: Secondary | ICD-10-CM | POA: Diagnosis not present

## 2013-08-21 DIAGNOSIS — R0989 Other specified symptoms and signs involving the circulatory and respiratory systems: Secondary | ICD-10-CM | POA: Diagnosis not present

## 2013-08-21 DIAGNOSIS — R0600 Dyspnea, unspecified: Secondary | ICD-10-CM

## 2013-08-21 DIAGNOSIS — I10 Essential (primary) hypertension: Secondary | ICD-10-CM | POA: Diagnosis not present

## 2013-08-21 DIAGNOSIS — E785 Hyperlipidemia, unspecified: Secondary | ICD-10-CM

## 2013-08-21 DIAGNOSIS — R109 Unspecified abdominal pain: Secondary | ICD-10-CM

## 2013-08-21 DIAGNOSIS — R7303 Prediabetes: Secondary | ICD-10-CM

## 2013-08-21 DIAGNOSIS — R0609 Other forms of dyspnea: Secondary | ICD-10-CM | POA: Diagnosis not present

## 2013-08-21 DIAGNOSIS — E538 Deficiency of other specified B group vitamins: Secondary | ICD-10-CM

## 2013-08-21 DIAGNOSIS — E559 Vitamin D deficiency, unspecified: Secondary | ICD-10-CM

## 2013-08-21 LAB — CBC WITH DIFFERENTIAL/PLATELET
Basophils Absolute: 0 10*3/uL (ref 0.0–0.1)
Basophils Relative: 0 % (ref 0–1)
Eosinophils Absolute: 0.1 10*3/uL (ref 0.0–0.7)
Eosinophils Relative: 1 % (ref 0–5)
HCT: 35.8 % — ABNORMAL LOW (ref 36.0–46.0)
Hemoglobin: 12.1 g/dL (ref 12.0–15.0)
Lymphocytes Relative: 31 % (ref 12–46)
Lymphs Abs: 2.1 10*3/uL (ref 0.7–4.0)
MCH: 30.6 pg (ref 26.0–34.0)
MCHC: 33.8 g/dL (ref 30.0–36.0)
MCV: 90.6 fL (ref 78.0–100.0)
Monocytes Absolute: 0.4 10*3/uL (ref 0.1–1.0)
Monocytes Relative: 6 % (ref 3–12)
Neutro Abs: 4.3 10*3/uL (ref 1.7–7.7)
Neutrophils Relative %: 62 % (ref 43–77)
Platelets: 230 10*3/uL (ref 150–400)
RBC: 3.95 MIL/uL (ref 3.87–5.11)
RDW: 13.8 % (ref 11.5–15.5)
WBC: 6.9 10*3/uL (ref 4.0–10.5)

## 2013-08-21 LAB — HEMOGLOBIN A1C
Hgb A1c MFr Bld: 5.9 % — ABNORMAL HIGH (ref <5.7)
MEAN PLASMA GLUCOSE: 123 mg/dL — AB (ref <117)

## 2013-08-21 NOTE — Progress Notes (Signed)
Subjective:   Renee Hawkins is a 73 y.o. female who presents for Medicare Annual Wellness Visit and complete physical.    Date of last medicare wellness visit is unknown.  Her blood pressure has been controlled at home, today their BP is BP: 120/62 mmHg She does workout, bike, dance, and stays active. She denies chest pain, shortness of breath, dizziness.  She is on cholesterol medication and denies myalgias. Her cholesterol is at goal. The cholesterol last visit was:   Lab Results  Component Value Date   CHOL 180 05/08/2013   HDL 49 05/08/2013   LDLCALC 102* 05/08/2013   TRIG 146 05/08/2013   CHOLHDL 3.7 05/08/2013   She has been working on diet and exercise for prediabetes, and denies nausea, paresthesia of the feet and polydipsia. Last A1C in the office was:  Lab Results  Component Value Date   HGBA1C 5.7* 05/08/2013   Patient is on Vitamin D supplement.   Patient feels that she has abdominal bloating. She has seen her OB/GYN and she states she had a pelvic US. She has some abdominal tenderness,denies nausea, diarrhea, constipation, black stools. She is over due for colonoscopy. No distal edema.   Names of Other Physician/Practitioners you currently use: 1. [facility]- here for primary care 2. [place] eye doctor, last visit Yearly 3. dentist, last visit as needed Patient Care Team: Unk Pinto, MD as PCP - General (Internal Medicine)   Medication Review Current Outpatient Prescriptions on File Prior to Visit  Medication Sig Dispense Refill  . atorvastatin (LIPITOR) 80 MG tablet Take 80 mg by mouth daily.      . beclomethasone (QVAR) 80 MCG/ACT inhaler Inhale 2 puffs into the lungs 2 (two) times daily.      . Cholecalciferol 4000 UNITS CAPS Take 1 capsule by mouth daily.      . Ferrous Sulfate Dried (SLOW RELEASE IRON) 45 MG TBCR Take by mouth daily.      Marland Kitchen gabapentin (NEURONTIN) 100 MG capsule TAKE 1 TO 2 CAPSULES 4 TIMES A DAY FOR NERVE PAIN   240 capsule  1  . LORazepam (ATIVAN) 2 MG tablet Take 1 tablet (2 mg total) by mouth at bedtime as needed for anxiety.  90 tablet  0  . Magnesium 300 MG CAPS Take 1 capsule by mouth daily.      Marland Kitchen omeprazole (PRILOSEC) 40 MG capsule Take 40 mg by mouth daily.      . potassium chloride (MICRO-K) 10 MEQ CR capsule TAKE TWO CAPSULES EACH DAY  120 capsule  3  . tiotropium (SPIRIVA) 18 MCG inhalation capsule Place 18 mcg into inhaler and inhale daily.      . verapamil (CALAN-SR) 240 MG CR tablet TAKE 1 TABLET DAILY AFTER A MEAL FOR BLOOD PRESSURE  90 tablet  2   No current facility-administered medications on file prior to visit.    Current Problems (verified) Patient Active Problem List   Diagnosis Date Noted  . Hypertension   . Diverticulosis   . Neuropathy   . GERD (gastroesophageal reflux disease)   . Hypomagnesemia   . Hx of adenomatous colonic polyps   . Hyperlipidemia   . Prediabetes   . Vitamin D deficiency   . RLS (restless legs syndrome)   . Vitamin B12 deficiency   . Anxiety     Screening Tests Health Maintenance  Topic Date Due  . Mammogram  05/04/2013  . Influenza Vaccine  11/04/2013  . Colonoscopy  06/23/2017  .  Tetanus/tdap  08/14/2018  . Pneumococcal Polysaccharide Vaccine Age 47 And Over  Completed  . Zostavax  Completed    Immunization History  Administered Date(s) Administered  . Influenza Split 01/18/2013  . Pneumococcal Polysaccharide-23 11/19/2011  . Td 08/13/2008  . Zoster 04/06/2010    Preventative care: Last colonoscopy: 2009 DUE Olevia Perches) Last mammogram: 01/2013 Last pap smear/pelvic exam: Dr. Irven [place]  DEXA:01/2013  Prior vaccinations: UPTODATE SEE LIST  History reviewed: allergies, current medications, past family history, past medical history, past social history, past surgical history and problem list  Risk Factors: Osteoporosis: postmenopausal estrogen deficiency and dietary calcium and/or vitamin D deficiency History of fracture  in the past year: no  Tobacco History  Substance Use Topics  . Smoking status: Former Smoker    Quit date: 04/06/2012  . Smokeless tobacco: Never Used  . Alcohol Use: No   She does not smoke.  Patient is a former smoker. Are there smokers in your home (other than you)?  No  Alcohol Current alcohol use: none  Caffeine Current caffeine use: coffee 1 /day  Exercise Current exercise: bicycling and walking  Nutrition/Diet Current diet: in general, a "healthy" diet    Cardiac risk factors: advanced age (older than 56 for men, 59 for women), dyslipidemia, hypertension, obesity (BMI >= 30 kg/m2) and sedentary lifestyle.  Depression Screen (Note: if answer to either of the following is "Yes", a more complete depression screening is indicated)   Q1: Over the past two weeks, have you felt down, depressed or hopeless? No  Q2: Over the past two weeks, have you felt little interest or pleasure in doing things? No  Have you lost interest or pleasure in daily life? No  Do you often feel hopeless? No  Do you cry easily over simple problems? No  Activities of Daily Living In your present state of health, do you have any difficulty performing the following activities?:  Driving? No Managing money?  No Feeding yourself? No Getting from bed to chair? No Climbing a flight of stairs? No Preparing food and eating?: No Bathing or showering? No Getting dressed: No Getting to the toilet? No Using the toilet:No Moving around from place to place: No In the past year have you fallen or had a near fall?:No   Are you sexually active?  No  Do you have more than one partner?  No  Vision Difficulties: No  Hearing Difficulties: No Do you often ask people to speak up or repeat themselves? No Do you experience ringing or noises in your ears? No Do you have difficulty understanding soft or whispered voices? No  Cognition  Do you feel that you have a problem with memory?No  Do you often  misplace items? No  Do you feel safe at home?  Yes  Advanced directives Does patient have a [place]? Yes Does patient have a Living Will? Yes   Objective:     Blood pressure 120/62, pulse 84, temperature 98.6 F (37 C), resp. rate 16, weight 124 lb (56.246 kg). Body mass index is 22.67 kg/(m^2).  General appearance: alert, no distress, WD/WN,  female Cognitive Testing  Alert? Yes  Normal Appearance?Yes  Oriented to person? Yes  Place? Yes   Time? Yes  Recall of three objects?  Yes  Can perform simple calculations? Yes  Displays appropriate judgment?Yes  Can read the correct time from a watch face?Yes  HEENT: normocephalic, sclerae anicteric, TMs pearly, nares patent, no discharge or erythema, pharynx normal Oral cavity:  MMM, no lesions Neck: supple, no lymphadenopathy, no thyromegaly, no masses Heart: RRR, normal S1, S2, no murmurs Lungs: CTA bilaterally, no wheezes, rhonchi, or rales Abdomen: +bs, soft, diffuse tenderness, no rebound, distended, no masses, no hepatomegaly, no splenomegaly Musculoskeletal: nontender, no swelling, no obvious deformity Extremities: no edema, no cyanosis, no clubbing Pulses: 2+ symmetric, upper and lower extremities, normal cap refill Neurological: alert, oriented x 3, CN2-12 intact, strength normal upper extremities and lower extremities, sensation normal throughout, DTRs 2+ throughout, no cerebellar signs, gait normal Psychiatric: normal affect, behavior normal, pleasant  Breast: defer Gyn: defer  Rectal:   Assessment:   1. Hypertension - CBC with Differential - BASIC METABOLIC PANEL WITH GFR - Hepatic function panel - TSH  2. GERD (gastroesophageal reflux disease)  3. Vitamin B12 deficiency  4. Prediabetes Discussed general issues about diabetes pathophysiology and management., Educational material distributed., Suggested low cholesterol diet., Encouraged aerobic exercise., Discussed foot care., Reminded to  get yearly retinal exam. - Hemoglobin A1c  5. Neuropathy  6. Hypomagnesemia - Magnesium  7. Vitamin D deficiency  8. RLS (restless legs syndrome)  9. Other and unspecified hyperlipidemia - Lipid panel  10. Abdominal pain, unspecified site Abdominal distension and pain- normal pelvic US, get AB [place]- get Colonoscopy.  - US Abdomen Complete; Future - Brain natriuretic peptide [phone])   Plan:   During the course of the visit the patient was educated and counseled about appropriate screening and preventive services including:    Pneumococcal vaccine   Influenza vaccine  Td vaccine  Screening electrocardiogram  Screening mammography  Bone densitometry screening  Colorectal cancer screening  Diabetes screening  Glaucoma screening  Nutrition counseling   Screening recommendations, referrals:  Vaccinations: Tdap vaccine not indicated Influenza vaccine not indicated Pneumococcal vaccine not indicated Shingles vaccine declined Hep B vaccine not indicated  Nutrition assessed and recommended  Colonoscopy ordered Mammogram requested Pap smear not indicated Pelvic exam not indicated Recommended yearly ophthalmology/optometry visit for glaucoma screening and checkup Recommended yearly dental visit for hygiene and checkup Advanced directives - declined  Conditions/risks identified: BMI: Discussed weight loss, diet, and increase physical activity.  Increase physical activity: AHA recommends 150 minutes of physical activity a week.  Medications reviewed DEXA- requested Diabetes at goal, ACE/ARB therapy Yes. Urinary Incontinence is not an issue: discussed non pharmacology and pharmacology options.  Fall risk: low- discussed PT, home fall assessment, medications.   Medicare Attestation I have personally reviewed: The patient's medical and social history Their use of alcohol, tobacco or illicit drugs Their current medications and supplements The patient's  functional ability including ADLs,fall risks, home safety risks, cognitive, and hearing and visual impairment Diet and physical activities Evidence for depression or mood disorders  The patient's weight, height, BMI, and visual acuity have been recorded in the chart.  I have made referrals, counseling, and provided education to the patient based on review of the above and I have provided the patient with a written personalized care plan for preventive services.     Vicie Mutters, PA-C   08/21/2013

## 2013-08-21 NOTE — Patient Instructions (Signed)
Abdominal Pain, Adult Many things can cause abdominal pain. Usually, abdominal pain is not caused by a disease and will improve without treatment. It can often be observed and treated at home. Your health care provider will do a physical exam and possibly order blood tests and X-rays to help determine the seriousness of your pain. However, in many cases, more time must pass before a clear cause of the pain can be found. Before that point, your health care provider may not know if you need more testing or further treatment. HOME CARE INSTRUCTIONS  Monitor your abdominal pain for any changes. The following actions may help to alleviate any discomfort you are experiencing:  Only take over-the-counter or prescription medicines as directed by your health care provider.  Do not take laxatives unless directed to do so by your health care provider.  Try a clear liquid diet (broth, tea, or water) as directed by your health care provider. Slowly move to a bland diet as tolerated. SEEK MEDICAL CARE IF:  You have unexplained abdominal pain.  You have abdominal pain associated with nausea or diarrhea.  You have pain when you urinate or have a bowel movement.  You experience abdominal pain that wakes you in the night.  You have abdominal pain that is worsened or improved by eating food.  You have abdominal pain that is worsened with eating fatty foods. SEEK IMMEDIATE MEDICAL CARE IF:   Your pain does not go away within 2 hours.  You have a fever.  You keep throwing up (vomiting).  Your pain is felt only in portions of the abdomen, such as the right side or the left lower portion of the abdomen.  You pass bloody or black tarry stools. MAKE SURE YOU:  Understand these instructions.   Will watch your condition.   Will get help right away if you are not doing well or get worse.  Document Released: 12/31/2004 Document Revised: 01/11/2013 Document Reviewed: 11/30/2012 [facility] Patient  Information 2014 [place].    Bad carbs also include fruit juice, alcohol, and sweet tea. These are empty calories that do not signal to your brain that you are full.   Please remember the good carbs are still carbs which convert into sugar. So please measure them out no more than 1/2-1 cup of rice, oatmeal, pasta, and beans.  Veggies are however free foods! Pile them on.   I like lean protein at every meal such as chicken, [place], pork chops, cottage cheese, etc. Just do not fry these meats and please center your meal around vegetable, the meats should be a side dish.   No all fruit is created equal. Please see the list below, the fruit at the bottom is higher in sugars than the fruit at the top

## 2013-08-22 LAB — BASIC METABOLIC PANEL WITH GFR
BUN: 13 mg/dL (ref 6–23)
CO2: 30 meq/L (ref 19–32)
Calcium: 9.4 mg/dL (ref 8.4–10.5)
Chloride: 99 mEq/L (ref 96–112)
Creat: 0.87 mg/dL (ref 0.50–1.10)
GFR, EST AFRICAN AMERICAN: 78 mL/min
GFR, Est Non African American: 67 mL/min
GLUCOSE: 91 mg/dL (ref 70–99)
POTASSIUM: 4.3 meq/L (ref 3.5–5.3)
Sodium: 138 mEq/L (ref 135–145)

## 2013-08-22 LAB — HEPATIC FUNCTION PANEL
ALK PHOS: 130 U/L — AB (ref 39–117)
ALT: 14 U/L (ref 0–35)
AST: 17 U/L (ref 0–37)
Albumin: 4.2 g/dL (ref 3.5–5.2)
BILIRUBIN INDIRECT: 0.3 mg/dL (ref 0.2–1.2)
Bilirubin, Direct: 0.1 mg/dL (ref 0.0–0.3)
Total Bilirubin: 0.4 mg/dL (ref 0.2–1.2)
Total Protein: 7 g/dL (ref 6.0–8.3)

## 2013-08-22 LAB — BRAIN NATRIURETIC PEPTIDE: BRAIN NATRIURETIC PEPTIDE: 5.7 pg/mL (ref 0.0–100.0)

## 2013-08-22 LAB — LIPID PANEL
Cholesterol: 156 mg/dL (ref 0–200)
HDL: 43 mg/dL (ref >39)
LDL CALC: 81 mg/dL (ref 0–99)
Total CHOL/HDL Ratio: 3.6 Ratio
Triglycerides: 159 mg/dL — ABNORMAL HIGH (ref <150)
VLDL: 32 mg/dL (ref 0–40)

## 2013-08-22 LAB — TSH: TSH: 1.27 u[IU]/mL (ref 0.350–4.500)

## 2013-08-22 LAB — MAGNESIUM: MAGNESIUM: 1.8 mg/dL (ref 1.5–2.5)

## 2013-08-24 ENCOUNTER — Ambulatory Visit
Admission: RE | Admit: 2013-08-24 | Discharge: 2013-08-24 | Disposition: A | Discharge status: Home / Self Care | Payer: Medicare Other | Source: Ambulatory Visit | Attending: Physician Assistant | Admitting: Physician Assistant

## 2013-08-24 DIAGNOSIS — R109 Unspecified abdominal pain: Secondary | ICD-10-CM

## 2013-08-24 DIAGNOSIS — K8020 Calculus of gallbladder without cholecystitis without obstruction: Secondary | ICD-10-CM | POA: Diagnosis not present

## 2013-09-26 ENCOUNTER — Other Ambulatory Visit: Payer: Self-pay

## 2013-09-26 MED ORDER — GABAPENTIN 100 MG PO CAPS: ORAL_CAPSULE | ORAL | Status: DC

## 2013-10-23 ENCOUNTER — Encounter: Payer: Self-pay | Admitting: Internal Medicine

## 2013-11-06 DIAGNOSIS — G4734 Idiopathic sleep related nonobstructive alveolar hypoventilation: Secondary | ICD-10-CM | POA: Diagnosis not present

## 2013-11-06 DIAGNOSIS — J449 Chronic obstructive pulmonary disease, unspecified: Secondary | ICD-10-CM | POA: Diagnosis not present

## 2013-11-06 DIAGNOSIS — R911 Solitary pulmonary nodule: Secondary | ICD-10-CM | POA: Diagnosis not present

## 2013-11-14 ENCOUNTER — Encounter: Payer: Self-pay | Admitting: Internal Medicine

## 2013-11-17 ENCOUNTER — Other Ambulatory Visit: Payer: Self-pay | Admitting: Internal Medicine

## 2013-11-22 DIAGNOSIS — H01119 Allergic dermatitis of unspecified eye, unspecified eyelid: Secondary | ICD-10-CM | POA: Diagnosis not present

## 2013-11-23 ENCOUNTER — Encounter: Payer: Self-pay | Admitting: Internal Medicine

## 2013-11-27 ENCOUNTER — Ambulatory Visit (INDEPENDENT_AMBULATORY_CARE_PROVIDER_SITE_OTHER): Payer: Medicare Other | Admitting: Physician Assistant

## 2013-11-27 ENCOUNTER — Encounter: Payer: Self-pay | Admitting: Physician Assistant

## 2013-11-27 VITALS — BP 110/70 | HR 80 | Temp 98.6°F | Resp 16 | Ht 63.0 in | Wt 123.0 lb

## 2013-11-27 DIAGNOSIS — Z23 Encounter for immunization: Secondary | ICD-10-CM

## 2013-11-27 DIAGNOSIS — E538 Deficiency of other specified B group vitamins: Secondary | ICD-10-CM

## 2013-11-27 DIAGNOSIS — N183 Chronic kidney disease, stage 3 unspecified: Secondary | ICD-10-CM | POA: Insufficient documentation

## 2013-11-27 DIAGNOSIS — K2100 Gastro-esophageal reflux disease with esophagitis, without bleeding: Secondary | ICD-10-CM | POA: Diagnosis not present

## 2013-11-27 DIAGNOSIS — N182 Chronic kidney disease, stage 2 (mild): Secondary | ICD-10-CM

## 2013-11-27 DIAGNOSIS — J439 Emphysema, unspecified: Secondary | ICD-10-CM | POA: Insufficient documentation

## 2013-11-27 DIAGNOSIS — R7309 Other abnormal glucose: Secondary | ICD-10-CM

## 2013-11-27 DIAGNOSIS — I10 Essential (primary) hypertension: Secondary | ICD-10-CM

## 2013-11-27 DIAGNOSIS — J438 Other emphysema: Secondary | ICD-10-CM

## 2013-11-27 DIAGNOSIS — E785 Hyperlipidemia, unspecified: Secondary | ICD-10-CM | POA: Diagnosis not present

## 2013-11-27 DIAGNOSIS — F419 Anxiety disorder, unspecified: Secondary | ICD-10-CM

## 2013-11-27 DIAGNOSIS — R7303 Prediabetes: Secondary | ICD-10-CM

## 2013-11-27 DIAGNOSIS — G2581 Restless legs syndrome: Secondary | ICD-10-CM

## 2013-11-27 DIAGNOSIS — Z0000 Encounter for general adult medical examination without abnormal findings: Secondary | ICD-10-CM

## 2013-11-27 DIAGNOSIS — G589 Mononeuropathy, unspecified: Secondary | ICD-10-CM

## 2013-11-27 DIAGNOSIS — F411 Generalized anxiety disorder: Secondary | ICD-10-CM

## 2013-11-27 DIAGNOSIS — E559 Vitamin D deficiency, unspecified: Secondary | ICD-10-CM

## 2013-11-27 DIAGNOSIS — G629 Polyneuropathy, unspecified: Secondary | ICD-10-CM

## 2013-11-27 LAB — MAGNESIUM: MAGNESIUM: 1.9 mg/dL (ref 1.5–2.5)

## 2013-11-27 LAB — CBC WITH DIFFERENTIAL/PLATELET
Basophils Absolute: 0 10*3/uL (ref 0.0–0.1)
Basophils Relative: 0 % (ref 0–1)
Eosinophils Absolute: 0.1 10*3/uL (ref 0.0–0.7)
Eosinophils Relative: 1 % (ref 0–5)
HEMATOCRIT: 36.4 % (ref 36.0–46.0)
HEMOGLOBIN: 12.5 g/dL (ref 12.0–15.0)
LYMPHS PCT: 35 % (ref 12–46)
Lymphs Abs: 2.3 10*3/uL (ref 0.7–4.0)
MCH: 31 pg (ref 26.0–34.0)
MCHC: 34.3 g/dL (ref 30.0–36.0)
MCV: 90.3 fL (ref 78.0–100.0)
MONOS PCT: 6 % (ref 3–12)
Monocytes Absolute: 0.4 10*3/uL (ref 0.1–1.0)
NEUTROS ABS: 3.8 10*3/uL (ref 1.7–7.7)
Neutrophils Relative %: 58 % (ref 43–77)
Platelets: 194 10*3/uL (ref 150–400)
RBC: 4.03 MIL/uL (ref 3.87–5.11)
RDW: 13.5 % (ref 11.5–15.5)
WBC: 6.5 10*3/uL (ref 4.0–10.5)

## 2013-11-27 LAB — BASIC METABOLIC PANEL WITH GFR
BUN: 14 mg/dL (ref 6–23)
CHLORIDE: 100 meq/L (ref 96–112)
CO2: 32 mEq/L (ref 19–32)
Calcium: 9.5 mg/dL (ref 8.4–10.5)
Creat: 0.89 mg/dL (ref 0.50–1.10)
GFR, Est African American: 75 mL/min
GFR, Est Non African American: 65 mL/min
GLUCOSE: 107 mg/dL — AB (ref 70–99)
POTASSIUM: 4.1 meq/L (ref 3.5–5.3)
Sodium: 139 mEq/L (ref 135–145)

## 2013-11-27 LAB — HEPATIC FUNCTION PANEL
ALT: 15 U/L (ref 0–35)
AST: 18 U/L (ref 0–37)
Albumin: 4.5 g/dL (ref 3.5–5.2)
Alkaline Phosphatase: 133 U/L — ABNORMAL HIGH (ref 39–117)
BILIRUBIN DIRECT: 0.1 mg/dL (ref 0.0–0.3)
BILIRUBIN TOTAL: 0.3 mg/dL (ref 0.2–1.2)
Indirect Bilirubin: 0.2 mg/dL (ref 0.2–1.2)
Total Protein: 7.2 g/dL (ref 6.0–8.3)

## 2013-11-27 LAB — LIPID PANEL
CHOL/HDL RATIO: 3.6 ratio
CHOLESTEROL: 174 mg/dL (ref 0–200)
HDL: 48 mg/dL (ref >39)
LDL Cholesterol: 89 mg/dL (ref 0–99)
Triglycerides: 187 mg/dL — ABNORMAL HIGH (ref <150)
VLDL: 37 mg/dL (ref 0–40)

## 2013-11-27 LAB — HEMOGLOBIN A1C
HEMOGLOBIN A1C: 5.6 % (ref <5.7)
Mean Plasma Glucose: 114 mg/dL (ref <117)

## 2013-11-27 LAB — TSH: TSH: 1.545 u[IU]/mL (ref 0.350–4.500)

## 2013-11-27 NOTE — Progress Notes (Signed)
Complete Physical  Assessment and Plan: 1. CKD (chronic kidney disease) stage 2, GFR 60-89 ml/min - BASIC METABOLIC PANEL WITH GFR  2. Pulmonary emphysema, unspecified emphysema type Continue inhalers  3. Essential hypertension - CBC with Differential - BASIC METABOLIC PANEL WITH GFR - Hepatic function panel - TSH - Urinalysis, Routine w reflex microscopic - Microalbumin / creatinine urine ratio - EKG 12-Lead  4. Gastroesophageal reflux disease with esophagitis Continue meds as needed  5. Vitamin B12 deficiency Continue supplement  6. Prediabetes Discussed general issues about diabetes pathophysiology and management., Educational material distributed., Suggested low cholesterol diet., Encouraged aerobic exercise., Discussed foot care., Reminded to get yearly retinal exam. - Hemoglobin A1c - HM DIABETES FOOT EXAM  7. Neuropathy Continue gabapentin  8. Anxiety Controlled with medications  9. Hyperlipidemia - Lipid panel  10. Hypomagnesemia - Magnesium  11. RLS (restless legs syndrome) Continue gabapentin  12. Vitamin D deficiency Continue supplement  13. Need for prophylactic vaccination against Streptococcus pneumoniae (pneumococcus) prevnar 13 given   Discussed med's effects and SE's. Screening labs and tests as requested with regular follow-up as recommended.  HPI 73 y.o. female  presents for a complete physical.  Her blood pressure has been controlled at home, today their BP is BP: 110/70 mmHg She does workout, walking and dancing. She denies chest pain, shortness of breath, dizziness.  She is doing the spiriva respet and Qvar for severe emphysema, has stable nodules, she is on 2L [place] at night only She is on cholesterol medication and denies myalgias. Her cholesterol is at goal. The cholesterol last visit was:   Lab Results  Component Value Date   CHOL 156 08/21/2013   HDL 43 08/21/2013   LDLCALC 81 08/21/2013   TRIG 159* 08/21/2013   CHOLHDL 3.6  08/21/2013   She has had prediabetes for 5 years years. She has been working on diet and exercise for prediabetes, she does have p. neuropathy, and denies paresthesia of the feet, polydipsia and polyuria. Last A1C in the office was:  Lab Results  Component Value Date   HGBA1C 5.9* 08/21/2013   Patient is on Vitamin D supplement.   Lab Results  Component Value Date   VD25OH >120* 05/08/2013      Current Medications:  Current Outpatient Prescriptions on File Prior to Visit  Medication Sig Dispense Refill  . atorvastatin (LIPITOR) 80 MG tablet TAKE ONE (1) TABLET BY MOUTH EVERY DAY FOR CHOLESTEROL  30 tablet  0  . beclomethasone (QVAR) 80 MCG/ACT inhaler Inhale 2 puffs into the lungs 2 (two) times daily.      . Cholecalciferol 4000 UNITS CAPS Take 1 capsule by mouth daily.      . Ferrous Sulfate Dried (SLOW RELEASE IRON) 45 MG TBCR Take by mouth daily.      Marland Kitchen gabapentin (NEURONTIN) 100 MG capsule Take 1-2 capsules 4 times daily for nerve pain  240 capsule  1  . LORazepam (ATIVAN) 2 MG tablet Take 1 tablet (2 mg total) by mouth at bedtime as needed for anxiety.  90 tablet  0  . Magnesium 300 MG CAPS Take 1 capsule by mouth daily.      Marland Kitchen omeprazole (PRILOSEC) 40 MG capsule Take 40 mg by mouth daily.      . potassium chloride (MICRO-K) 10 MEQ CR capsule TAKE TWO CAPSULES EACH DAY  120 capsule  3  . tiotropium (SPIRIVA) 18 MCG inhalation capsule Place 18 mcg into inhaler and inhale daily.      . verapamil (  CALAN-SR) 240 MG CR tablet TAKE 1 TABLET DAILY AFTER A MEAL FOR BLOOD PRESSURE  90 tablet  2   No current facility-administered medications on file prior to visit.   Health Maintenance:   Immunization History  Administered Date(s) Administered  . Influenza Split 01/18/2013  . Pneumococcal Polysaccharide-23 11/19/2011  . Td 08/13/2008  . Zoster 04/06/2010   Last colonoscopy: 2009 DUE Olevia Perches) Last mammogram: 01/2013 Last pap smear/pelvic exam: Dr. Irven [place]  DEXA:01/2013  Allergies:   Allergies  Allergen Reactions  . Atenolol   . Clonazepam   . Levaquin [Levofloxacin In D5w]   . Vasotec [Enalapril]    Medical History:  Past Medical History  Diagnosis Date  . Hypomagnesemia   . Neuropathy   . Diverticulosis   . Hx of adenomatous colonic polyps   . Hypertension   . GERD (gastroesophageal reflux disease)   . Hyperlipidemia   . Prediabetes   . Vitamin D deficiency   . RLS (restless legs syndrome)   . Vitamin B12 deficiency   . Anxiety    Surgical History:  Past Surgical History  Procedure Laterality Date  . Appendectomy    . Tonsillectomy     Family History:  Family History  Problem Relation Age of Onset  . Colon cancer Neg Hx   . Stroke Mother   . Stroke Maternal Grandmother    Social History:  History  Substance Use Topics  . Smoking status: Former Smoker    Quit date: 04/06/2012  . Smokeless tobacco: Never Used  . Alcohol Use: No   Review of Systems: [X]  = complains of  [ ]  = denies  General: Fatigue [ ]  Fever [ ]  Chills [ ]  Weakness [ ]   Insomnia [ ] Weight change [ ]  Night sweats [ ]   Change in appetite [ ]  Eyes: Redness [ ]  Blurred vision [ ]  Diplopia [ ]  Discharge [ ]   ENT: Congestion [ ]  Sinus Pain [ ]  Post Nasal Drip [ ]  Sore Throat [ ]  Earache [ ]  hearing loss [ ]  Tinnitus [ ]  Snoring [ ]   Cardiac: Chest pain/pressure [ ]  SOB [ ]  Orthopnea [ ]   Palpitations [ ]   Paroxysmal nocturnal dyspnea[ ]  Claudication [ ]  Edema [ ]   Pulmonary: Cough [ ]  Wheezing[x ]  SOB [ ]   Pleurisy [ ]   GI: Nausea [ ]  Vomiting[ ]  Dysphagia[ ]  Heartburn[ ]  Abdominal pain [ ]  Constipation [ ] ; Diarrhea [ ]  BRBPR [ ]  Melena[ ]  Bloating [ ]  Hemorrhoids [ ]   GU: Hematuria[ ]  Dysuria [ ]  Nocturia[ ]  Urgency [ ]   Hesitancy [ ]  Discharge [ ]  Frequency [ ]   Breast:  Breast lumps [ ]   nipple discharge [ ]    Neuro: Headaches[ ]  Vertigo[ ]  Paresthesias[ ]  Spasm [ ]  Speech changes [ ]  Incoordination [ ]   Ortho: Arthritis [ ]  Joint pain [ ]  Muscle pain [ ]  Joint swelling [ ]   Back Pain [ ]  Skin:  Rash [ ]   Pruritis [ ]  Change in skin lesion [ ]   Psych: Depression[ ]  Anxiety[ ]  Confusion [ ]  Memory loss [ ]   Heme/Lypmh: Bleeding [ ]  Bruising [ ]  Enlarged lymph nodes [ ]   Endocrine: Visual blurring [ ]  Paresthesia [ ]  Polyuria [ ]  Polydypsea [ ]    Heat/cold intolerance [ ]  Hypoglycemia [ ]   Physical Exam: Estimated body mass index is 21.79 kg/(m^2) as calculated from the following:   Height as of this encounter: 5\' 3"  (1.6 m).   Weight as  of this encounter: 123 lb (55.792 kg). BP 110/70  Pulse 80  Temp(Src) 98.6 F (37 C)  Resp 16  Ht 5\' 3"  (1.6 m)  Wt 123 lb (55.792 kg)  BMI 21.79 kg/m2 General Appearance: Well nourished, in no apparent distress. Eyes: PERRLA, EOMs, conjunctiva no swelling or erythema, normal fundi and vessels. Sinuses: No Frontal/maxillary tenderness ENT/Mouth: Ext aud canals clear, normal light reflex with TMs without erythema, bulging.  Good dentition. No erythema, swelling, or exudate on post pharynx. Tonsils not swollen or erythematous. Hearing normal.  Neck: Supple, thyroid normal. No bruits Respiratory: Respiratory effort normal, BS equal bilaterally without rales, rhonchi, wheezing or stridor. Cardio: RRR without murmurs, rubs or gallops. Brisk peripheral pulses without edema.  Chest: symmetric, with normal excursions and percussion. Breasts: Symmetric, without lumps, nipple discharge, retractions. Abdomen: Soft, +BS. Non tender, no guarding, rebound, hernias, masses, or organomegaly. .  Lymphatics: Non tender without lymphadenopathy.  Genitourinary: defer Musculoskeletal: Full ROM all peripheral extremities,5/5 strength, and normal gait. Skin: Warm, dry without rashes, lesions, ecchymosis.  Neuro: Cranial nerves intact, reflexes equal bilaterally. Normal muscle tone, no cerebellar symptoms. Sensation intact.  Psych: Awake and oriented X 3, normal affect, Insight and Judgment appropriate.   EKG: WNL no changes.   Vicie Mutters 2:20 PM

## 2013-11-27 NOTE — Patient Instructions (Signed)

## 2013-11-28 LAB — URINALYSIS, ROUTINE W REFLEX MICROSCOPIC
BILIRUBIN URINE: NEGATIVE
GLUCOSE, UA: NEGATIVE mg/dL
Hgb urine dipstick: NEGATIVE
KETONES UR: NEGATIVE mg/dL
Nitrite: NEGATIVE
PH: 7 (ref 5.0–8.0)
Protein, ur: NEGATIVE mg/dL
SPECIFIC GRAVITY, URINE: 1.009 (ref 1.005–1.030)
UROBILINOGEN UA: 0.2 mg/dL (ref 0.0–1.0)

## 2013-11-28 LAB — URINALYSIS, MICROSCOPIC ONLY
Bacteria, UA: NONE SEEN
CRYSTALS: NONE SEEN
Casts: NONE SEEN
SQUAMOUS EPITHELIAL / LPF: NONE SEEN

## 2013-11-28 LAB — MICROALBUMIN / CREATININE URINE RATIO
Creatinine, Urine: 32.1 mg/dL
MICROALB UR: 0.76 mg/dL (ref 0.00–1.89)
MICROALB/CREAT RATIO: 23.7 mg/g (ref 0.0–30.0)

## 2013-12-20 ENCOUNTER — Encounter: Payer: Self-pay | Admitting: Internal Medicine

## 2014-01-03 ENCOUNTER — Other Ambulatory Visit: Payer: Self-pay | Admitting: Emergency Medicine

## 2014-01-04 DIAGNOSIS — Z23 Encounter for immunization: Secondary | ICD-10-CM | POA: Diagnosis not present

## 2014-01-09 ENCOUNTER — Other Ambulatory Visit: Payer: Self-pay | Admitting: Internal Medicine

## 2014-01-09 DIAGNOSIS — Z1231 Encounter for screening mammogram for malignant neoplasm of breast: Secondary | ICD-10-CM

## 2014-01-15 ENCOUNTER — Encounter: Payer: Self-pay | Admitting: Internal Medicine

## 2014-01-23 ENCOUNTER — Ambulatory Visit (HOSPITAL_COMMUNITY): Payer: Medicare Other

## 2014-01-31 ENCOUNTER — Ambulatory Visit (HOSPITAL_COMMUNITY)
Admission: RE | Admit: 2014-01-31 | Discharge: 2014-01-31 | Disposition: A | Discharge status: Home / Self Care | Payer: Medicare Other | Source: Ambulatory Visit | Attending: Internal Medicine | Admitting: Internal Medicine

## 2014-01-31 DIAGNOSIS — Z1231 Encounter for screening mammogram for malignant neoplasm of breast: Secondary | ICD-10-CM | POA: Insufficient documentation

## 2014-02-27 ENCOUNTER — Other Ambulatory Visit: Payer: Self-pay

## 2014-02-27 MED ORDER — GABAPENTIN 100 MG PO CAPS: ORAL_CAPSULE | ORAL | Status: DC

## 2014-03-06 ENCOUNTER — Encounter: Payer: Self-pay | Admitting: Internal Medicine

## 2014-03-06 ENCOUNTER — Ambulatory Visit (INDEPENDENT_AMBULATORY_CARE_PROVIDER_SITE_OTHER): Payer: Medicare Other | Admitting: Internal Medicine

## 2014-03-06 VITALS — BP 126/72 | HR 76 | Temp 97.9°F | Resp 16 | Ht 63.0 in | Wt 129.8 lb

## 2014-03-06 DIAGNOSIS — Z79899 Other long term (current) drug therapy: Secondary | ICD-10-CM | POA: Diagnosis not present

## 2014-03-06 DIAGNOSIS — R7303 Prediabetes: Secondary | ICD-10-CM

## 2014-03-06 DIAGNOSIS — I10 Essential (primary) hypertension: Secondary | ICD-10-CM | POA: Diagnosis not present

## 2014-03-06 DIAGNOSIS — R7309 Other abnormal glucose: Secondary | ICD-10-CM | POA: Diagnosis not present

## 2014-03-06 DIAGNOSIS — E785 Hyperlipidemia, unspecified: Secondary | ICD-10-CM

## 2014-03-06 DIAGNOSIS — E559 Vitamin D deficiency, unspecified: Secondary | ICD-10-CM

## 2014-03-06 NOTE — Progress Notes (Signed)
Patient ID: Renee Hawkins, female   DOB: 10-06-1940, 73 y.o.   MRN: [id number]   This very nice 73 y.o.WWF presents for 3 month follow up with Hypertension, Hyperlipidemia, Pre-Diabetes and Vitamin D Deficiency. Patient also has COPD and reports smoking cessation about 1 year ago! She admits DOE and denies any sputum pdn.    Patient is treated for HTN & BP has been controlled at home. Today's BP: 126/72 mmHg. Patient has had no complaints of any cardiac type chest pain, palpitations, dyspnea/orthopnea/PND, dizziness, claudication, or dependent edema. Patient does have hx/o sensitivity to diuretics wit refractory hypokalemia and hypomagnesia.    Hyperlipidemia is controlled with diet & meds. Patient denies myalgias or other med SE's. Last Lipids were at goal -  Total Chol  174; HDL 48; LDL 89; Trig 187 on 11/27/2013.   Also, the patient has history of PreDiabetes and has had no symptoms of reactive hypoglycemia, diabetic polys, paresthesias or visual blurring.  Last A1c was 5.6 % on  11/27/2013.   Further, the patient also has history of Vitamin D Deficiency (44 in 2009) and supplements vitamin D without any suspected side-effects. Last vitamin D was  >120 on  05/08/2013 and dose was tapered from 4,000 to 2,000 u/da.   Medication List   beclomethasone 80 MCG/ACT inhaler  Commonly known as:  QVAR  Inhale 2 puffs into the lungs 2 (two) times daily.     Cholecalciferol 2000 UNITS Caps  Take 1 capsule by mouth daily.     FLUZONE HIGH-DOSE 0.5 ML Susy  Generic drug:  Influenza Vac Split High-Dose     gabapentin 100 MG capsule  Commonly known as:  NEURONTIN  Take 1-2 capsules 4 times daily for nerve pain     LORazepam 2 MG tablet  Commonly known as:  ATIVAN  Take 1 tablet (2 mg total) by mouth at bedtime as needed for anxiety.     Magnesium 300 MG Caps  Take 1 capsule by mouth daily.     omeprazole 40 MG capsule  Commonly known as:  PRILOSEC  Take 40 mg by mouth daily.     potassium  chloride 10 MEQ CR capsule  Commonly known as:  MICRO-K  TAKE TWO CAPSULES EACH DAY     SLOW RELEASE IRON 45 MG Tbcr  Generic drug:  Ferrous Sulfate Dried  Take by mouth daily.     tiotropium 18 MCG inhalation capsule  Commonly known as:  SPIRIVA  Place 18 mcg into inhaler and inhale daily.     verapamil 240 MG CR tablet  Commonly known as:  CALAN-SR  TAKE 1 TABLET DAILY AFTER A MEAL FOR BLOOD PRESSURE     Allergies  Allergen Reactions  . Atenolol   . Clonazepam   . Levaquin [Levofloxacin In D5w]   . Vasotec [Enalapril]     PMHx:   Past Medical History  Diagnosis Date  . Hypomagnesemia   . Neuropathy   . Diverticulosis   . Hx of adenomatous colonic polyps   . Hypertension   . GERD (gastroesophageal reflux disease)   . Hyperlipidemia   . Prediabetes   . Vitamin D deficiency   . RLS (restless legs syndrome)   . Vitamin B12 deficiency   . Anxiety    Immunization History  Administered Date(s) Administered  . Influenza Split 01/18/2013  . Pneumococcal Conjugate-13 11/27/2013  . Pneumococcal Polysaccharide-23 11/19/2011  . Td 08/13/2008  . Zoster 04/06/2010   Past Surgical History  Procedure Laterality Date  .  Appendectomy    . Tonsillectomy     FHx:    Reviewed / unchanged  SHx:    Reviewed / unchanged  Systems Review:  Constitutional: Denies fever, chills, wt changes, headaches, insomnia, fatigue, night sweats, change in appetite. Eyes: Denies redness, blurred vision, diplopia, discharge, itchy, watery eyes.  ENT: Denies discharge, congestion, post nasal drip, epistaxis, sore throat, earache, hearing loss, dental pain, tinnitus, vertigo, sinus pain, snoring.  CV: Denies chest pain, palpitations, irregular heartbeat, syncope, dyspnea, diaphoresis, orthopnea, PND, claudication or edema. Respiratory: denies cough, dyspnea, DOE, pleurisy, hoarseness, laryngitis, wheezing.  Gastrointestinal: Denies dysphagia, odynophagia, heartburn, reflux, water brash,  abdominal pain or cramps, nausea, vomiting, bloating, diarrhea, constipation, hematemesis, melena, hematochezia  or hemorrhoids. Genitourinary: Denies dysuria, frequency, urgency, nocturia, hesitancy, discharge, hematuria or flank pain. Musculoskeletal: Denies arthralgias, myalgias, stiffness, jt. swelling, pain, limping or strain/sprain.  Skin: Denies pruritus, rash, hives, warts, acne, eczema or change in skin lesion(s). Neuro: No weakness, tremor, incoordination, spasms, paresthesia or pain. Psychiatric: Denies confusion, memory loss or sensory loss. Endo: Denies change in weight, skin or hair change.  Heme/Lymph: No excessive bleeding, bruising or enlarged lymph nodes.  Physical Exam  BP 126/72 m  Pulse 76  Temp 97.9 F   Resp 16  Ht 5\' 3"   Wt 129 lb 12.8 oz   BMI 23.00 kg/m2  SpO2 95%  Appearsthin,  well nourished and in no distress. Eyes: PERRLA, EOMs, conjunctiva no swelling or erythema. Sinuses: No frontal/maxillary tenderness ENT/Mouth: EAC's clear, TM's nl w/o erythema, bulging. Nares clear w/o erythema, swelling, exudates. Oropharynx clear without erythema or exudates. Oral hygiene is good. Tongue normal, non obstructing. Hearing intact.  Neck: Supple. Thyroid nl. Car 2+/2+ without bruits, nodes or JVD. Chest: Respirations nl with BS clear & equal w/o rales, rhonchi, wheezing or stridor.  Cor: Heart sounds normal w/ regular rate and rhythm without sig. murmurs, gallops, clicks, or rubs. Peripheral pulses normal and equal  With trace ankle edema assym to the left. Neg homan's sign's..  Abdomen: Soft & bowel sounds normal. Non-tender w/o guarding, rebound, hernias, masses, or organomegaly.  Lymphatics: Unremarkable.  Musculoskeletal: Full ROM all peripheral extremities, joint stability, 5/5 strength, and normal gait.  Skin: Warm, dry without exposed rashes, lesions or ecchymosis apparent.  Neuro: Cranial nerves intact, reflexes equal bilaterally. Sensory-motor testing grossly  intact. Tendon reflexes grossly intact.  Pysch: Alert & oriented x 3.  Insight and judgement nl & appropriate. No ideations.  Assessment and Plan:  1. Hypertension - Continue monitor blood pressure at home. Continue diet/meds same.  2. Hyperlipidemia - Continue diet/meds, exercise,& lifestyle modifications. Continue monitor periodic cholesterol/liver & renal functions   3. Pre-Diabetes - Continue diet, exercise, lifestyle modifications. Monitor appropriate labs.  4. Vitamin D Deficiency - Continue supplementation.   Recommended regular exercise, BP monitoring, weight control, and discussed med and SE's. Recommended labs to assess and monitor clinical status. Further disposition pending results of labs.

## 2014-03-06 NOTE — Patient Instructions (Signed)

## 2014-03-07 ENCOUNTER — Telehealth: Payer: Self-pay | Admitting: *Deleted

## 2014-03-07 LAB — CBC WITH DIFFERENTIAL/PLATELET
BASOS PCT: 0 % (ref 0–1)
Basophils Absolute: 0 10*3/uL (ref 0.0–0.1)
Eosinophils Absolute: 0.1 10*3/uL (ref 0.0–0.7)
Eosinophils Relative: 1 % (ref 0–5)
HEMATOCRIT: 34.9 % — AB (ref 36.0–46.0)
Hemoglobin: 12.2 g/dL (ref 12.0–15.0)
Lymphocytes Relative: 29 % (ref 12–46)
Lymphs Abs: 2.1 10*3/uL (ref 0.7–4.0)
MCH: 31.4 pg (ref 26.0–34.0)
MCHC: 35 g/dL (ref 30.0–36.0)
MCV: 89.9 fL (ref 78.0–100.0)
MPV: 10.1 fL (ref 9.4–12.4)
Monocytes Absolute: 0.4 10*3/uL (ref 0.1–1.0)
Monocytes Relative: 6 % (ref 3–12)
NEUTROS ABS: 4.7 10*3/uL (ref 1.7–7.7)
NEUTROS PCT: 64 % (ref 43–77)
Platelets: 212 10*3/uL (ref 150–400)
RBC: 3.88 MIL/uL (ref 3.87–5.11)
RDW: 14.4 % (ref 11.5–15.5)
WBC: 7.3 10*3/uL (ref 4.0–10.5)

## 2014-03-07 LAB — HEPATIC FUNCTION PANEL
ALT: 14 U/L (ref 0–35)
AST: 17 U/L (ref 0–37)
Albumin: 4.1 g/dL (ref 3.5–5.2)
Alkaline Phosphatase: 123 U/L — ABNORMAL HIGH (ref 39–117)
BILIRUBIN DIRECT: 0.1 mg/dL (ref 0.0–0.3)
BILIRUBIN TOTAL: 0.5 mg/dL (ref 0.2–1.2)
Indirect Bilirubin: 0.4 mg/dL (ref 0.2–1.2)
Total Protein: 6.9 g/dL (ref 6.0–8.3)

## 2014-03-07 LAB — TSH: TSH: 2.06 u[IU]/mL (ref 0.350–4.500)

## 2014-03-07 LAB — MAGNESIUM: Magnesium: 1.6 mg/dL (ref 1.5–2.5)

## 2014-03-07 LAB — LIPID PANEL
CHOL/HDL RATIO: 3.8 ratio
CHOLESTEROL: 168 mg/dL (ref 0–200)
HDL: 44 mg/dL (ref >39)
LDL Cholesterol: 83 mg/dL (ref 0–99)
Triglycerides: 207 mg/dL — ABNORMAL HIGH (ref <150)
VLDL: 41 mg/dL — AB (ref 0–40)

## 2014-03-07 LAB — BASIC METABOLIC PANEL WITH GFR
BUN: 11 mg/dL (ref 6–23)
CO2: 30 mEq/L (ref 19–32)
Calcium: 9.3 mg/dL (ref 8.4–10.5)
Chloride: 101 mEq/L (ref 96–112)
Creat: 0.8 mg/dL (ref 0.50–1.10)
GFR, EST AFRICAN AMERICAN: 86 mL/min
GFR, EST NON AFRICAN AMERICAN: 74 mL/min
Glucose, Bld: 81 mg/dL (ref 70–99)
Potassium: 4 mEq/L (ref 3.5–5.3)
SODIUM: 139 meq/L (ref 135–145)

## 2014-03-07 LAB — HEMOGLOBIN A1C
Hgb A1c MFr Bld: 5.7 % — ABNORMAL HIGH (ref <5.7)
Mean Plasma Glucose: 117 mg/dL — ABNORMAL HIGH (ref <117)

## 2014-03-07 LAB — INSULIN, FASTING: Insulin fasting, serum: 5.9 u[IU]/mL (ref 2.0–19.6)

## 2014-03-07 LAB — VITAMIN D 25 HYDROXY (VIT D DEFICIENCY, FRACTURES): VIT D 25 HYDROXY: 62 ng/mL (ref 30–100)

## 2014-03-07 NOTE — Telephone Encounter (Signed)
Yes, it is a great idea to assess her risks for colonoscopy.

## 2014-03-07 NOTE — Telephone Encounter (Signed)
Dr Olevia Perches, Reviewing chart for PV noticed pt uses 02 at night. I called her this am and she states she uses 1.5L via [place] at bedtime every night for her COPD. She saw you last 09-11-2011 and her last colon was 2009 with polyps and tics. She states she did not use 02 at her last OV with you in 2013.  Do you want her to have an OV with you Or an extender before we do her colon or do you want her to be a direct hospital colon?   Please advise.  Thanks for your time,  Marijean Niemann

## 2014-03-08 NOTE — Telephone Encounter (Signed)
Contacted the patient and moved her appointment to a PA schedule.

## 2014-03-09 ENCOUNTER — Encounter: Payer: Self-pay | Admitting: Physician Assistant

## 2014-03-09 ENCOUNTER — Ambulatory Visit (INDEPENDENT_AMBULATORY_CARE_PROVIDER_SITE_OTHER): Payer: Medicare Other | Admitting: Physician Assistant

## 2014-03-09 ENCOUNTER — Other Ambulatory Visit: Payer: Self-pay | Admitting: *Deleted

## 2014-03-09 VITALS — BP 120/64 | HR 92 | Ht 62.0 in | Wt 129.0 lb

## 2014-03-09 DIAGNOSIS — Z1211 Encounter for screening for malignant neoplasm of colon: Secondary | ICD-10-CM

## 2014-03-09 DIAGNOSIS — Z86010 Personal history of colonic polyps: Secondary | ICD-10-CM

## 2014-03-09 MED ORDER — MOVIPREP 100 G PO SOLR: 1.0000 | ORAL | Status: DC

## 2014-03-09 NOTE — Progress Notes (Signed)
Reviewed and agree, next hospital week will be in Jan 2016.

## 2014-03-09 NOTE — Patient Instructions (Signed)
You have been scheduled for a colonoscopy. Please follow written instructions given to you at your visit today.  Please pick up your prep kit at the pharmacy within the next 1-3 days. [facility], [place]. If you use inhalers (even only as needed), please bring them with you on the day of your procedure. Your physician has requested that you go to www.startemmi.com and enter the access code given to you at your visit today. This web site gives a general overview about your procedure. However, you should still follow specific instructions given to you by our office regarding your preparation for the procedure.

## 2014-03-09 NOTE — Progress Notes (Signed)
Patient ID: Renee Hawkins, female   DOB: 1941/01/29, 73 y.o.   MRN: [id number]     History of Present Illness: Renee Hawkins is a 73 year old female with a history of hypertension, hyperlipidemia, prediabetes, and vitamin D deficiency. She also has a history of COPD and uses home oxygen 1.5 L at night.  Renee Hawkins had a colonoscopy with Dr. Maurene Capes in March 2009. At that time multiple left and right colon polyps were removed. Pathology revealed these to be adenomatous with no malignancy. She was advised to have surveillance in 5 years and presents to do so at this time. She feels well and offers no complaints she has no bright red blood per rectum or melena. Her appetite is good. Her weight is stable. She has no abdominal pain. She has been seen for dysphagia in the past but states since using omeprazole she has no dysphagia whatsoever.   Past Medical History  Diagnosis Date  . Hypomagnesemia   . Neuropathy   . Diverticulosis   . Hx of adenomatous colonic polyps   . Hypertension   . GERD (gastroesophageal reflux disease)   . Hyperlipidemia   . Prediabetes   . Vitamin D deficiency   . RLS (restless legs syndrome)   . Vitamin B12 deficiency   . Anxiety   . COPD (chronic obstructive pulmonary disease)     Past Surgical History  Procedure Laterality Date  . Appendectomy    . Tonsillectomy     Family History  Problem Relation Age of Onset  . Colon cancer Neg Hx   . Stroke Mother   . Stroke Maternal Grandmother    History  Substance Use Topics  . Smoking status: Former Smoker    Quit date: 04/06/2012  . Smokeless tobacco: Never Used  . Alcohol Use: No   Current Outpatient Prescriptions  Medication Sig Dispense Refill  . atorvastatin (LIPITOR) 80 MG tablet TAKE ONE (1) TABLET BY MOUTH EVERY DAY FOR CHOLESTEROL 30 tablet 0  . beclomethasone (QVAR) 80 MCG/ACT inhaler Inhale 2 puffs into the lungs 2 (two) times daily.    . Cholecalciferol 4000 UNITS CAPS Take 1 capsule by mouth daily.      . Ferrous Sulfate Dried (SLOW RELEASE IRON) 45 MG TBCR Take by mouth daily.    Marland Kitchen FLUZONE HIGH-DOSE 0.5 ML SUSY   0  . gabapentin (NEURONTIN) 100 MG capsule Take 1-2 capsules 4 times daily for nerve pain 240 capsule 0  . LORazepam (ATIVAN) 2 MG tablet Take 1 tablet (2 mg total) by mouth at bedtime as needed for anxiety. 90 tablet 0  . Magnesium 300 MG CAPS Take 1 capsule by mouth daily.    Marland Kitchen omeprazole (PRILOSEC) 40 MG capsule Take 40 mg by mouth daily.    . potassium chloride (MICRO-K) 10 MEQ CR capsule TAKE TWO CAPSULES EACH DAY 120 capsule 1  . tiotropium (SPIRIVA) 18 MCG inhalation capsule Place 18 mcg into inhaler and inhale daily.    . verapamil (CALAN-SR) 240 MG CR tablet TAKE 1 TABLET DAILY AFTER A MEAL FOR BLOOD PRESSURE 90 tablet 2  . MOVIPREP 100 G SOLR Take 1 kit (200 g total) by mouth as directed. 1 kit 0   No current facility-administered medications for this visit.   Allergies  Allergen Reactions  . Atenolol   . Clonazepam   . Levaquin [Levofloxacin In D5w]   . Vasotec [Enalapril]       Review of Systems: Gen: Denies any fever, chills, sweats, anorexia, fatigue, weakness, malaise,  weight loss, and sleep disorder CV: Denies chest pain, angina, palpitations, syncope, orthopnea, PND, peripheral edema, and claudication. Resp: Denies dyspnea at rest, dyspnea with exercise, cough, sputum, wheezing, coughing up blood, and pleurisy. GI: Denies vomiting blood, jaundice, and fecal incontinence.   Denies dysphagia or odynophagia. GU : Denies urinary burning, blood in urine, urinary frequency, urinary hesitancy, nocturnal urination, and urinary incontinence. MS: Denies joint pain, limitation of movement, and swelling, stiffness, low back pain, extremity pain. Denies muscle weakness, cramps, atrophy.  Derm: Denies rash, itching, dry skin, hives, moles, warts, or unhealing ulcers.  Psych: Denies depression, anxiety, memory loss, suicidal ideation, hallucinations, paranoia, and  confusion. Heme: Denies bruising, bleeding, and enlarged lymph nodes. Neuro:  Denies any headaches, dizziness, paresthesia Endo:  Denies any problems with DM, thyroid, adrenal  LAB RESULTS:  Recent Labs  03/06/14 1617  WBC 7.3  HGB 12.2  HCT 34.9*  PLT 212   BMET  Recent Labs  03/06/14 1617  NA 139  K 4.0  CL 101  CO2 30  GLUCOSE 81  BUN 11  CREATININE 0.80  CALCIUM 9.3   LFT  Recent Labs  03/06/14 1617  PROT 6.9  ALBUMIN 4.1  AST 17  ALT 14  ALKPHOS 123*  BILITOT 0.5  BILIDIR 0.1  IBILI 0.4    Studies:  Colonoscopy 06/24/2007 revealed colon polyps and diverticulosis. Multiple left and right colon polyps.   Physical Exam: General: Pleasant, well developed , pleasant female in no acute distress Head: Normocephalic and atraumatic Eyes:  sclerae anicteric, conjunctiva pink  Ears: Normal auditory acuity Lungs: Clear throughout to auscultation Heart: Regular rate and rhythm Abdomen: Soft, non distended, non-tender. No masses, no hepatomegaly. Normal bowel sounds Musculoskeletal: Symmetrical with no gross deformities  Extremities: No edema  Neurological: Alert oriented x 4, grossly nonfocal Psychological:  Alert and cooperative. Normal mood and affect  Assessment and Recommendations: 73 year old female with a personal history of adenomatous polyps due for surveillance colonoscopy to evaluate for recurrent polyps or neoplasia.The risks, benefits, and alternatives to colonoscopy with possible biopsy and possible polypectomy were discussed with the patient and they consent to proceed. As the patient uses home oxygen, she will be scheduled at the hospital with Dr. Olevia Perches.    Gustabo Gordillo, Vita Barley PA-C 03/09/2014,

## 2014-03-09 NOTE — Telephone Encounter (Signed)
OV with PA 12-4 as per TE.  Renee Hawkins PV

## 2014-03-23 ENCOUNTER — Encounter: Payer: Medicare Other | Admitting: Internal Medicine

## 2014-04-03 ENCOUNTER — Ambulatory Visit: Payer: Self-pay | Admitting: Physician Assistant

## 2014-04-03 ENCOUNTER — Other Ambulatory Visit: Payer: Self-pay | Admitting: Physician Assistant

## 2014-04-03 ENCOUNTER — Encounter: Payer: Self-pay | Admitting: Internal Medicine

## 2014-04-03 ENCOUNTER — Ambulatory Visit (INDEPENDENT_AMBULATORY_CARE_PROVIDER_SITE_OTHER): Payer: Medicare Other | Admitting: Physician Assistant

## 2014-04-03 DIAGNOSIS — R600 Localized edema: Secondary | ICD-10-CM

## 2014-04-03 DIAGNOSIS — I10 Essential (primary) hypertension: Secondary | ICD-10-CM

## 2014-04-03 LAB — BASIC METABOLIC PANEL WITH GFR
BUN: 12 mg/dL (ref 6–23)
CO2: 32 mEq/L (ref 19–32)
CREATININE: 0.93 mg/dL (ref 0.50–1.10)
Calcium: 9.5 mg/dL (ref 8.4–10.5)
Chloride: 100 mEq/L (ref 96–112)
GFR, EST AFRICAN AMERICAN: 72 mL/min
GFR, EST NON AFRICAN AMERICAN: 62 mL/min
Glucose, Bld: 82 mg/dL (ref 70–99)
Potassium: 4.3 mEq/L (ref 3.5–5.3)
Sodium: 140 mEq/L (ref 135–145)

## 2014-04-03 LAB — MAGNESIUM: MAGNESIUM: 1.8 mg/dL (ref 1.5–2.5)

## 2014-04-03 LAB — CBC WITH DIFFERENTIAL/PLATELET
Basophils Absolute: 0 10*3/uL (ref 0.0–0.1)
Basophils Relative: 0 % (ref 0–1)
EOS ABS: 0.1 10*3/uL (ref 0.0–0.7)
Eosinophils Relative: 2 % (ref 0–5)
HEMATOCRIT: 38.3 % (ref 36.0–46.0)
Hemoglobin: 12.5 g/dL (ref 12.0–15.0)
Lymphocytes Relative: 31 % (ref 12–46)
Lymphs Abs: 1.9 10*3/uL (ref 0.7–4.0)
MCH: 30.5 pg (ref 26.0–34.0)
MCHC: 32.6 g/dL (ref 30.0–36.0)
MCV: 93.4 fL (ref 78.0–100.0)
MONOS PCT: 6 % (ref 3–12)
MPV: 10.6 fL (ref 8.6–12.4)
Monocytes Absolute: 0.4 10*3/uL (ref 0.1–1.0)
Neutro Abs: 3.7 10*3/uL (ref 1.7–7.7)
Neutrophils Relative %: 61 % (ref 43–77)
Platelets: 209 10*3/uL (ref 150–400)
RBC: 4.1 MIL/uL (ref 3.87–5.11)
RDW: 13.4 % (ref 11.5–15.5)
WBC: 6 10*3/uL (ref 4.0–10.5)

## 2014-04-03 LAB — HEPATIC FUNCTION PANEL
ALT: 16 U/L (ref 0–35)
AST: 18 U/L (ref 0–37)
Albumin: 4.3 g/dL (ref 3.5–5.2)
Alkaline Phosphatase: 116 U/L (ref 39–117)
Bilirubin, Direct: 0.1 mg/dL (ref 0.0–0.3)
Indirect Bilirubin: 0.4 mg/dL (ref 0.2–1.2)
Total Bilirubin: 0.5 mg/dL (ref 0.2–1.2)
Total Protein: 7 g/dL (ref 6.0–8.3)

## 2014-04-03 LAB — URIC ACID: URIC ACID, SERUM: 6.1 mg/dL (ref 2.4–7.0)

## 2014-04-03 MED ORDER — FUROSEMIDE 20 MG PO TABS: ORAL_TABLET | ORAL | Status: DC

## 2014-04-03 NOTE — Patient Instructions (Signed)
Get on the lasix 20mg  1/2-1 pill a day, monitor your blood pressure while on it. If you get any dizziness please stop it.   Elevate your foot 3 times a day above your heart Get compression stockings.  Go to the ER if any chest pain, severe shortness of breath, nausea, dizziness, severe HA, changes vision/speech   Edema Edema is an abnormal buildup of fluids in your bodytissues. Edema is somewhatdependent on gravity to pull the fluid to the lowest place in your body. That makes the condition more common in the legs and thighs (lower extremities). Painless swelling of the feet and ankles is common and becomes more likely as you get older. It is also common in looser tissues, like around your eyes.  When the affected area is squeezed, the fluid may move out of that spot and leave a dent for a few moments. This dent is called pitting.  CAUSES  There are many possible causes of edema. Eating too much salt and being on your feet or sitting for a long time can cause edema in your legs and ankles. Hot weather may make edema worse. Common medical causes of edema include:  Heart failure.  Liver disease.  Kidney disease.  Weak blood vessels in your legs.  Cancer.  An injury.  Pregnancy.  Some medications.  Obesity. SYMPTOMS  Edema is usually painless.Your skin may look swollen or shiny.  DIAGNOSIS  Your health care provider may be able to diagnose edema by asking about your medical history and doing a physical exam. You may need to have tests such as X-rays, an electrocardiogram, or blood tests to check for medical conditions that may cause edema.  TREATMENT  Edema treatment depends on the cause. If you have heart, liver, or kidney disease, you need the treatment appropriate for these conditions. General treatment may include:  Elevation of the affected body part above the level of your heart.  Compression of the affected body part. Pressure from elastic bandages or support stockings  squeezes the tissues and forces fluid back into the blood vessels. This keeps fluid from entering the tissues.  Restriction of fluid and salt intake.  Use of a water pill (diuretic). These medications are appropriate only for some types of edema. They pull fluid out of your body and make you urinate more often. This gets rid of fluid and reduces swelling, but diuretics can have side effects. Only use diuretics as directed by your health care provider. HOME CARE INSTRUCTIONS   Keep the affected body part above the level of your heart when you are lying down.   Do not sit still or stand for prolonged periods.   Do not put anything directly under your knees when lying down.  Do not wear constricting clothing or garters on your upper legs.   Exercise your legs to work the fluid back into your blood vessels. This may help the swelling go down.   Wear elastic bandages or support stockings to reduce ankle swelling as directed by your health care provider.   Eat a low-salt diet to reduce fluid if your health care provider recommends it.   Only take medicines as directed by your health care provider. SEEK MEDICAL CARE IF:   Your edema is not responding to treatment.  You have heart, liver, or kidney disease and notice symptoms of edema.  You have edema in your legs that does not improve after elevating them.   You have sudden and unexplained weight gain. SEEK IMMEDIATE  MEDICAL CARE IF:   You develop shortness of breath or chest pain.   You cannot breathe when you lie down.  You develop pain, redness, or warmth in the swollen areas.   You have heart, liver, or kidney disease and suddenly get edema.  You have a fever and your symptoms suddenly get worse. MAKE SURE YOU:   Understand these instructions.  Will watch your condition.  Will get help right away if you are not doing well or get worse. Document Released: 03/23/2005 Document Revised: 08/07/2013 Document  Reviewed: 01/13/2013 [facility] Patient Information 2015 [place], [place]. This information is not intended to replace advice given to you by your health care provider. Make sure you discuss any questions you have with your health care provider.

## 2014-04-03 NOTE — Progress Notes (Signed)
Subjective:    Patient ID: Renee Hawkins, female    DOB: Apr 05, 1941, 73 y.o.   MRN: [id number]  HPI 73 y.o. white female with history of HTN, CKD, neuropathy, RLS, presents with swelling in her left ankle since the 03/06/2014, has been getting worse, denies pain but states mainly it is a tightness, she has been icing it, resting, and elevating. Ice helps some, does not go away with sleeping at night. Worse with standing for a long time. Not worse with flexion, movement, no warmth, tenderness. Denies CP, SOB. She also states she feels generally more bloated in her arms, legs, and Ab. Has had negative AB [place] and pelvis US, getting colonoscopy with Dr. Olevia Perches.   Lab Results  Component Value Date   WBC 7.3 03/06/2014   HGB 12.2 03/06/2014   HCT 34.9* 03/06/2014   MCV 89.9 03/06/2014   PLT 212 03/06/2014   Lab Results  Component Value Date   CREATININE 0.80 03/06/2014   BUN 11 03/06/2014   NA 139 03/06/2014   K 4.0 03/06/2014   CL 101 03/06/2014   CO2 30 03/06/2014   Lab Results  Component Value Date   ALT 14 03/06/2014   AST 17 03/06/2014   ALKPHOS 123* 03/06/2014   BILITOT 0.5 03/06/2014    Review of Systems  Constitutional: Negative.  Negative for fever, chills and fatigue.  HENT: Negative.   Respiratory: Positive for shortness of breath (chronic). Negative for apnea, cough, choking, chest tightness, wheezing and stridor.   Cardiovascular: Positive for leg swelling. Negative for chest pain and palpitations.  Gastrointestinal: Positive for abdominal pain and abdominal distention. Negative for nausea, vomiting, diarrhea, constipation, blood in stool, anal bleeding and rectal pain.  Genitourinary: Negative.   Musculoskeletal: Negative.   Skin: Negative.   Neurological: Positive for numbness (bilateral legs).  Hematological: Negative.   Psychiatric/Behavioral: Negative.        Objective:   Physical Exam  Constitutional: She appears well-developed and well-nourished.   HENT:  Head: Normocephalic and atraumatic.  Eyes: Conjunctivae are normal. Pupils are equal, round, and reactive to light.  Neck: Normal range of motion. Neck supple.  Cardiovascular: Normal rate, regular rhythm and normal heart sounds.  Exam reveals decreased pulses.   No murmur heard. Pulses:      Femoral pulses are 2+ on the right side, and 2+ on the left side with bruit.      Popliteal pulses are 2+ on the right side, and 2+ on the left side.       Dorsalis pedis pulses are 1+ on the right side, and 1+ on the left side.       Posterior tibial pulses are 1+ on the right side, and 1+ on the left side.  Left leg with 2-3+ edema, dependent rubor, no warmth.   Pulmonary/Chest: Effort normal. She has decreased breath sounds (diffuse). She has no wheezes. She has no rales.  Abdominal: Soft. Bowel sounds are normal. She exhibits distension. There is tenderness (right lower quadrant tenderness).  Musculoskeletal:  Patient is able to ambulate well.  Gait is not  antalgic.left ankle with erythema edema. no tenderness, full range of motion and no warmth. Neurological Exam: numb at bilateral feet Vascular Exam: pulse present   Lymphadenopathy:    She has cervical adenopathy.       Assessment & Plan:  Edema left leg- ? Bruit left femoral- elevate legs TID, increase activity, increase water, decrease sodium intake.  Wear compression socks more routinely if  available. Return to the office if no change with symptoms. Check labs, get Ddimer, rule out DVT, low risk. If all is negative, may need to get CT with and without contrast pelvis to rule out mass effect/compression, + bruit left femoral.   Abdominal fullness/bloating- has had normal AB [place] and pelvic US, getting colonoscopy with Dr. Olevia Perches.

## 2014-04-04 LAB — D-DIMER, QUANTITATIVE: D-Dimer, Quant: 0.51 ug/mL-FEU — ABNORMAL HIGH (ref 0.00–0.48)

## 2014-04-04 LAB — SEDIMENTATION RATE: Sed Rate: 20 mm/hr (ref 0–22)

## 2014-04-04 LAB — TSH: TSH: 2.58 u[IU]/mL (ref 0.350–4.500)

## 2014-04-04 NOTE — Addendum Note (Signed)
Addended by: Vicie Mutters R on: 04/04/2014 08:20 AM   Modules accepted: Orders

## 2014-04-05 ENCOUNTER — Ambulatory Visit
Admission: RE | Admit: 2014-04-05 | Discharge: 2014-04-05 | Disposition: A | Discharge status: Home / Self Care | Payer: Medicare Other | Source: Ambulatory Visit | Attending: Physician Assistant | Admitting: Physician Assistant

## 2014-04-05 DIAGNOSIS — M79662 Pain in left lower leg: Secondary | ICD-10-CM | POA: Diagnosis not present

## 2014-04-05 DIAGNOSIS — R600 Localized edema: Secondary | ICD-10-CM

## 2014-04-05 DIAGNOSIS — R609 Edema, unspecified: Secondary | ICD-10-CM | POA: Diagnosis not present

## 2014-04-05 DIAGNOSIS — L539 Erythematous condition, unspecified: Secondary | ICD-10-CM | POA: Diagnosis not present

## 2014-04-09 ENCOUNTER — Other Ambulatory Visit: Payer: Self-pay | Admitting: Internal Medicine

## 2014-04-09 DIAGNOSIS — M5136 Other intervertebral disc degeneration, lumbar region: Secondary | ICD-10-CM | POA: Diagnosis not present

## 2014-04-09 DIAGNOSIS — M9904 Segmental and somatic dysfunction of sacral region: Secondary | ICD-10-CM | POA: Diagnosis not present

## 2014-04-16 ENCOUNTER — Encounter: Payer: Self-pay | Admitting: Physician Assistant

## 2014-04-16 ENCOUNTER — Ambulatory Visit (INDEPENDENT_AMBULATORY_CARE_PROVIDER_SITE_OTHER): Payer: Medicare Other | Admitting: Physician Assistant

## 2014-04-16 VITALS — BP 110/60 | HR 84 | Temp 97.7°F | Resp 16 | Ht 63.0 in | Wt 129.0 lb

## 2014-04-16 DIAGNOSIS — Z79899 Other long term (current) drug therapy: Secondary | ICD-10-CM | POA: Diagnosis not present

## 2014-04-16 DIAGNOSIS — R600 Localized edema: Secondary | ICD-10-CM

## 2014-04-16 DIAGNOSIS — N182 Chronic kidney disease, stage 2 (mild): Secondary | ICD-10-CM

## 2014-04-16 DIAGNOSIS — G629 Polyneuropathy, unspecified: Secondary | ICD-10-CM | POA: Diagnosis not present

## 2014-04-16 DIAGNOSIS — E538 Deficiency of other specified B group vitamins: Secondary | ICD-10-CM

## 2014-04-16 LAB — BASIC METABOLIC PANEL WITH GFR
BUN: 16 mg/dL (ref 6–23)
CO2: 36 meq/L — AB (ref 19–32)
Calcium: 9.6 mg/dL (ref 8.4–10.5)
Chloride: 99 mEq/L (ref 96–112)
Creat: 0.97 mg/dL (ref 0.50–1.10)
GFR, EST NON AFRICAN AMERICAN: 59 mL/min — AB
GFR, Est African American: 68 mL/min
GLUCOSE: 82 mg/dL (ref 70–99)
Potassium: 4.1 mEq/L (ref 3.5–5.3)
SODIUM: 141 meq/L (ref 135–145)

## 2014-04-16 LAB — HEPATIC FUNCTION PANEL
ALBUMIN: 4.1 g/dL (ref 3.5–5.2)
ALK PHOS: 125 U/L — AB (ref 39–117)
ALT: 15 U/L (ref 0–35)
AST: 18 U/L (ref 0–37)
BILIRUBIN DIRECT: 0.1 mg/dL (ref 0.0–0.3)
BILIRUBIN INDIRECT: 0.3 mg/dL (ref 0.2–1.2)
BILIRUBIN TOTAL: 0.4 mg/dL (ref 0.2–1.2)
Total Protein: 7 g/dL (ref 6.0–8.3)

## 2014-04-16 LAB — MAGNESIUM: MAGNESIUM: 1.8 mg/dL (ref 1.5–2.5)

## 2014-04-16 MED ORDER — AZITHROMYCIN 250 MG PO TABS: ORAL_TABLET | ORAL | Status: DC

## 2014-04-16 NOTE — Progress Notes (Deleted)
  74 y.o. WF with history of HTN, CKD, RLS, neuopathy presents for 1 week follow up of swelling in her right leg. She was seen 12/29 for right leg swelling, had negative ESR, uric acid, CBC, had elevated Ddimer with negative [place]. Worse with standing for long time, better with compression stockings, Denies warmth, redness, tenderness. Continue to have AB bloating, negative [place] AB and pelvis, getting colonoscopy with Dr. Olevia Perches in Feb.

## 2014-04-16 NOTE — Progress Notes (Signed)
Subjective:    Patient ID: Renee Hawkins, female    DOB: 06/29/1940, 73 y.o.   MRN: [id number]  HPI 73 y.o. WF with history of HTN, CKD, RLS, neuopathy presents for 1 week follow up of swelling in her left leg. She was seen 12/29 for left leg swelling, had negative ESR, uric acid, CBC, had elevated Ddimer with negative US. Worse with standing for long time, better with compression stockings, Denies warmth, redness, tenderness. Continue to have AB bloating, negative US AB and pelvis, getting colonoscopy with Dr. Brodie in Feb. She was put on lasix 20mg 1 pill daily which has not helped.    Review of Systems  Constitutional: Negative.  Negative for fever, chills and fatigue.  HENT: Negative.   Respiratory: Positive for shortness of breath (chronic). Negative for apnea, cough, choking, chest tightness, wheezing and stridor.   Cardiovascular: Positive for leg swelling. Negative for chest pain and palpitations.  Gastrointestinal: Positive for abdominal pain and abdominal distention. Negative for nausea, vomiting, diarrhea, constipation, blood in stool, anal bleeding and rectal pain.  Genitourinary: Negative.   Musculoskeletal: Negative.   Skin: Negative.   Neurological: Positive for numbness (bilateral legs).  Hematological: Negative.   Psychiatric/Behavioral: Negative.        Objective:   Physical Exam  Constitutional: She appears well-developed and well-nourished.  HENT:  Head: Normocephalic and atraumatic.  Eyes: Conjunctivae are normal. Pupils are equal, round, and reactive to light.  Neck: Normal range of motion. Neck supple.  Cardiovascular: Normal rate, regular rhythm and normal heart sounds.  Exam reveals decreased pulses.   No murmur heard. Pulses:      Femoral pulses are 2+ on the right side, and 2+ on the left side with bruit.      Popliteal pulses are 2+ on the right side, and 2+ on the left side.       Dorsalis pedis pulses are 1+ on the right side, and 1+ on the left  side.       Posterior tibial pulses are 1+ on the right side, and 1+ on the left side.  Left leg with 2-3+ edema, dependent rubor, no warmth.   Pulmonary/Chest: Effort normal. She has decreased breath sounds (diffuse). She has no wheezes. She has no rales.  Abdominal: Soft. Bowel sounds are normal. She exhibits distension. There is tenderness (right lower quadrant tenderness).  Musculoskeletal:  Patient is able to ambulate well.  Gait is not antalgic left ankle with 2+ edema to mid shin, without erythema, no tenderness, full range of motion and no warmth. Neurological Exam: numb at bilateral feet Vascular Exam: pulse presents and bounding      Assessment & Plan:  Right leg swelling- check BMP since on Lasix, recheck CBC and will do short ABX with Zpak per patient request. Continue elevation, compression stockings, increase exercise.  Ab pain/bloating- has femoral bruit, some concern for mass effect and we discussed CT scan AB/Pelvis to rule this out but patient declines at this time, if not better will get at another time.  B12 Def- not on anything, has a history of low B12, will check level.  

## 2014-04-16 NOTE — Patient Instructions (Signed)
Peripheral Edema You have swelling in your legs (peripheral edema). This swelling is due to excess accumulation of salt and water in your body. Edema may be a sign of heart, kidney or liver disease, or a side effect of a medication. It may also be due to problems in the leg veins. Elevating your legs and using special support stockings may be very helpful, if the cause of the swelling is due to poor venous circulation. Avoid long periods of standing, whatever the cause. Treatment of edema depends on identifying the cause. Chips, pretzels, pickles and other salty foods should be avoided. Restricting salt in your diet is almost always needed. Water pills (diuretics) are often used to remove the excess salt and water from your body via urine. These medicines prevent the kidney from reabsorbing sodium. This increases urine flow. Diuretic treatment may also result in lowering of potassium levels in your body. Potassium supplements may be needed if you have to use diuretics daily. Daily weights can help you keep track of your progress in clearing your edema. You should call your caregiver for follow up care as recommended. SEEK IMMEDIATE MEDICAL CARE IF:   You have increased swelling, pain, redness, or heat in your legs.  You develop shortness of breath, especially when lying down.  You develop chest or abdominal pain, weakness, or fainting.  You have a fever. Document Released: 04/30/2004 Document Revised: 06/15/2011 Document Reviewed:  Edema Edema is an abnormal buildup of fluids in your bodytissues. Edema is somewhatdependent on gravity to pull the fluid to the lowest place in your body. That makes the condition more common in the legs and thighs (lower extremities). Painless swelling of the feet and ankles is common and becomes more likely as you get older. It is also common in looser tissues, like around your eyes.  When the affected area is squeezed, the fluid may move out of that spot and leave a  dent for a few moments. This dent is called pitting.  CAUSES  There are many possible causes of edema. Eating too much salt and being on your feet or sitting for a long time can cause edema in your legs and ankles. Hot weather may make edema worse. Common medical causes of edema include:  Heart failure.  Liver disease.  Kidney disease.  Weak blood vessels in your legs.  Cancer.  An injury.  Pregnancy.  Some medications.  Obesity. SYMPTOMS  Edema is usually painless.Your skin may look swollen or shiny.  DIAGNOSIS  Your health care provider may be able to diagnose edema by asking about your medical history and doing a physical exam. You may need to have tests such as X-rays, an electrocardiogram, or blood tests to check for medical conditions that may cause edema.  TREATMENT  Edema treatment depends on the cause. If you have heart, liver, or kidney disease, you need the treatment appropriate for these conditions. General treatment may include:  Elevation of the affected body part above the level of your heart.  Compression of the affected body part. Pressure from elastic bandages or support stockings squeezes the tissues and forces fluid back into the blood vessels. This keeps fluid from entering the tissues.  Restriction of fluid and salt intake.  Use of a water pill (diuretic). These medications are appropriate only for some types of edema. They pull fluid out of your body and make you urinate more often. This gets rid of fluid and reduces swelling, but diuretics can have side effects. Only use  diuretics as directed by your health care provider. HOME CARE INSTRUCTIONS   Keep the affected body part above the level of your heart when you are lying down.   Do not sit still or stand for prolonged periods.   Do not put anything directly under your knees when lying down.  Do not wear constricting clothing or garters on your upper legs.   Exercise your legs to work the  fluid back into your blood vessels. This may help the swelling go down.   Wear elastic bandages or support stockings to reduce ankle swelling as directed by your health care provider.   Eat a low-salt diet to reduce fluid if your health care provider recommends it.   Only take medicines as directed by your health care provider. SEEK MEDICAL CARE IF:   Your edema is not responding to treatment.  You have heart, liver, or kidney disease and notice symptoms of edema.  You have edema in your legs that does not improve after elevating them.   You have sudden and unexplained weight gain. SEEK IMMEDIATE MEDICAL CARE IF:   You develop shortness of breath or chest pain.   You cannot breathe when you lie down.  You develop pain, redness, or warmth in the swollen areas.   You have heart, liver, or kidney disease and suddenly get edema.  You have a fever and your symptoms suddenly get worse. MAKE SURE YOU:   Understand these instructions.  Will watch your condition.  Will get help right away if you are not doing well or get worse. Document Released: 03/23/2005 Document Revised: 08/07/2013 Document Reviewed: 01/13/2013 [facility] Patient Information 2015 [place], [place]. This information is not intended to replace advice given to you by your health care provider. Make sure you discuss any questions you have with your health care provider.  04/10/2009 Exit[employer] Patient Information 2015 [place], [place]. This information is not intended to replace advice given to you by your health care provider. Make sure you discuss any questions you have with your health care provider.

## 2014-04-17 LAB — CBC WITH DIFFERENTIAL/PLATELET
Basophils Absolute: 0 10*3/uL (ref 0.0–0.1)
Basophils Relative: 0 % (ref 0–1)
Eosinophils Absolute: 0.1 10*3/uL (ref 0.0–0.7)
Eosinophils Relative: 2 % (ref 0–5)
HCT: 38.8 % (ref 36.0–46.0)
Hemoglobin: 12.7 g/dL (ref 12.0–15.0)
Lymphocytes Relative: 33 % (ref 12–46)
Lymphs Abs: 2.4 10*3/uL (ref 0.7–4.0)
MCH: 30.6 pg (ref 26.0–34.0)
MCHC: 32.7 g/dL (ref 30.0–36.0)
MCV: 93.5 fL (ref 78.0–100.0)
MONOS PCT: 7 % (ref 3–12)
MPV: 10.5 fL (ref 8.6–12.4)
Monocytes Absolute: 0.5 10*3/uL (ref 0.1–1.0)
NEUTROS ABS: 4.2 10*3/uL (ref 1.7–7.7)
Neutrophils Relative %: 58 % (ref 43–77)
Platelets: 224 10*3/uL (ref 150–400)
RBC: 4.15 MIL/uL (ref 3.87–5.11)
RDW: 13.8 % (ref 11.5–15.5)
WBC: 7.3 10*3/uL (ref 4.0–10.5)

## 2014-04-17 LAB — VITAMIN B12: VITAMIN B 12: 1225 pg/mL — AB (ref 211–911)

## 2014-04-23 DIAGNOSIS — M5136 Other intervertebral disc degeneration, lumbar region: Secondary | ICD-10-CM | POA: Diagnosis not present

## 2014-04-23 DIAGNOSIS — M9904 Segmental and somatic dysfunction of sacral region: Secondary | ICD-10-CM | POA: Diagnosis not present

## 2014-05-08 ENCOUNTER — Ambulatory Visit: Payer: Self-pay | Admitting: Physician Assistant

## 2014-05-17 NOTE — H&P (Signed)
Expand All Collapse All   Patient ID: Renee Hawkins, female DOB: 1940-11-01, 74 y.o. MRN: [id number]     History of Present Illness: Renee Hawkins is a 74 year old female with a history of hypertension, hyperlipidemia, prediabetes, and vitamin D deficiency. She also has a history of COPD and uses home oxygen 1.5 L at night.  Davy had a colonoscopy with Dr. Maurene Capes in March 2009. At that time multiple left and right colon polyps were removed. Pathology revealed these to be adenomatous with no malignancy. She was advised to have surveillance in 5 years and presents to do so at this time. She feels well and offers no complaints she has no bright red blood per rectum or melena. Her appetite is good. Her weight is stable. She has no abdominal pain. She has been seen for dysphagia in the past but states since using omeprazole she has no dysphagia whatsoever.   Past Medical History  Diagnosis Date  . Hypomagnesemia   . Neuropathy   . Diverticulosis   . Hx of adenomatous colonic polyps   . Hypertension   . GERD (gastroesophageal reflux disease)   . Hyperlipidemia   . Prediabetes   . Vitamin D deficiency   . RLS (restless legs syndrome)   . Vitamin B12 deficiency   . Anxiety   . COPD (chronic obstructive pulmonary disease)     Past Surgical History  Procedure Laterality Date  . Appendectomy    . Tonsillectomy     Family History  Problem Relation Age of Onset  . Colon cancer Neg Hx   . Stroke Mother   . Stroke Maternal Grandmother    History  Substance Use Topics  . Smoking status: Former Smoker    Quit date: 04/06/2012  . Smokeless tobacco: Never Used  . Alcohol Use: No   Current Outpatient Prescriptions  Medication Sig Dispense Refill  . atorvastatin (LIPITOR) 80 MG tablet TAKE ONE (1) TABLET BY MOUTH EVERY DAY FOR CHOLESTEROL 30 tablet 0  . beclomethasone (QVAR) 80 MCG/ACT  inhaler Inhale 2 puffs into the lungs 2 (two) times daily.    . Cholecalciferol 4000 UNITS CAPS Take 1 capsule by mouth daily.    . Ferrous Sulfate Dried (SLOW RELEASE IRON) 45 MG TBCR Take by mouth daily.    Marland Kitchen FLUZONE HIGH-DOSE 0.5 ML SUSY   0  . gabapentin (NEURONTIN) 100 MG capsule Take 1-2 capsules 4 times daily for nerve pain 240 capsule 0  . LORazepam (ATIVAN) 2 MG tablet Take 1 tablet (2 mg total) by mouth at bedtime as needed for anxiety. 90 tablet 0  . Magnesium 300 MG CAPS Take 1 capsule by mouth daily.    Marland Kitchen omeprazole (PRILOSEC) 40 MG capsule Take 40 mg by mouth daily.    . potassium chloride (MICRO-K) 10 MEQ CR capsule TAKE TWO CAPSULES EACH DAY 120 capsule 1  . tiotropium (SPIRIVA) 18 MCG inhalation capsule Place 18 mcg into inhaler and inhale daily.    . verapamil (CALAN-SR) 240 MG CR tablet TAKE 1 TABLET DAILY AFTER A MEAL FOR BLOOD PRESSURE 90 tablet 2  . MOVIPREP 100 G SOLR Take 1 kit (200 g total) by mouth as directed. 1 kit 0   No current facility-administered medications for this visit.   Allergies  Allergen Reactions  . Atenolol   . Clonazepam   . Levaquin [Levofloxacin In D5w]   . Vasotec [Enalapril]       Review of Systems: Gen: Denies any fever, chills, sweats, anorexia, fatigue,  weakness, malaise, weight loss, and sleep disorder CV: Denies chest pain, angina, palpitations, syncope, orthopnea, PND, peripheral edema, and claudication. Resp: Denies dyspnea at rest, dyspnea with exercise, cough, sputum, wheezing, coughing up blood, and pleurisy. GI: Denies vomiting blood, jaundice, and fecal incontinence. Denies dysphagia or odynophagia. GU : Denies urinary burning, blood in urine, urinary frequency, urinary hesitancy, nocturnal urination, and urinary incontinence. MS: Denies joint pain, limitation of movement, and swelling, stiffness, low back pain, extremity pain. Denies muscle  weakness, cramps, atrophy.  Derm: Denies rash, itching, dry skin, hives, moles, warts, or unhealing ulcers.  Psych: Denies depression, anxiety, memory loss, suicidal ideation, hallucinations, paranoia, and confusion. Heme: Denies bruising, bleeding, and enlarged lymph nodes. Neuro: Denies any headaches, dizziness, paresthesia Endo: Denies any problems with DM, thyroid, adrenal  LAB RESULTS:  Recent Labs (last 2 labs)      Recent Labs  03/06/14 1617  WBC 7.3  HGB 12.2  HCT 34.9*  PLT 212     BMET  Recent Labs (last 2 labs)      Recent Labs  03/06/14 1617  NA 139  K 4.0  CL 101  CO2 30  GLUCOSE 81  BUN 11  CREATININE 0.80  CALCIUM 9.3     LFT  Recent Labs (last 2 labs)      Recent Labs  03/06/14 1617  PROT 6.9  ALBUMIN 4.1  AST 17  ALT 14  ALKPHOS 123*  BILITOT 0.5  BILIDIR 0.1  IBILI 0.4      Studies:  Colonoscopy 06/24/2007 revealed colon polyps and diverticulosis. Multiple left and right colon polyps.   Physical Exam: General: Pleasant, well developed , pleasant female in no acute distress Head: Normocephalic and atraumatic Eyes: sclerae anicteric, conjunctiva pink  Ears: Normal auditory acuity Lungs: Clear throughout to auscultation Heart: Regular rate and rhythm Abdomen: Soft, non distended, non-tender. No masses, no hepatomegaly. Normal bowel sounds Musculoskeletal: Symmetrical with no gross deformities  Extremities: No edema  Neurological: Alert oriented x 4, grossly nonfocal Psychological: Alert and cooperative. Normal mood and affect  Assessment and Recommendations: 74 year old female with a personal history of adenomatous polyps due for surveillance colonoscopy to evaluate for recurrent polyps or neoplasia.The risks, benefits, and alternatives to colonoscopy with possible biopsy and possible polypectomy were discussed with the patient and they consent to proceed. As the patient uses  home oxygen, she will be scheduled at the hospital with Dr. Olevia Perches.    Hvozdovic, Deloris Ping 03/09/2014,             Lafayette Dragon, MD at 03/09/2014 8:00 PM

## 2014-05-18 ENCOUNTER — Encounter (HOSPITAL_COMMUNITY)
Admission: RE | Disposition: A | Discharge status: Home / Self Care | Payer: Medicare Other | Source: Ambulatory Visit | Attending: Internal Medicine

## 2014-05-18 ENCOUNTER — Ambulatory Visit (HOSPITAL_COMMUNITY)
Admission: RE | Admit: 2014-05-18 | Discharge: 2014-05-18 | Disposition: A | Discharge status: Home / Self Care | Payer: Medicare Other | Source: Ambulatory Visit | Attending: Internal Medicine | Admitting: Internal Medicine

## 2014-05-18 ENCOUNTER — Encounter (HOSPITAL_COMMUNITY): Payer: Self-pay

## 2014-05-18 DIAGNOSIS — G629 Polyneuropathy, unspecified: Secondary | ICD-10-CM | POA: Insufficient documentation

## 2014-05-18 DIAGNOSIS — K219 Gastro-esophageal reflux disease without esophagitis: Secondary | ICD-10-CM | POA: Diagnosis not present

## 2014-05-18 DIAGNOSIS — Z86010 Personal history of colonic polyps: Secondary | ICD-10-CM | POA: Insufficient documentation

## 2014-05-18 DIAGNOSIS — D125 Benign neoplasm of sigmoid colon: Secondary | ICD-10-CM | POA: Insufficient documentation

## 2014-05-18 DIAGNOSIS — K5730 Diverticulosis of large intestine without perforation or abscess without bleeding: Secondary | ICD-10-CM | POA: Insufficient documentation

## 2014-05-18 DIAGNOSIS — J449 Chronic obstructive pulmonary disease, unspecified: Secondary | ICD-10-CM | POA: Diagnosis not present

## 2014-05-18 DIAGNOSIS — E785 Hyperlipidemia, unspecified: Secondary | ICD-10-CM | POA: Diagnosis not present

## 2014-05-18 DIAGNOSIS — Z79899 Other long term (current) drug therapy: Secondary | ICD-10-CM | POA: Insufficient documentation

## 2014-05-18 DIAGNOSIS — Z87891 Personal history of nicotine dependence: Secondary | ICD-10-CM | POA: Diagnosis not present

## 2014-05-18 DIAGNOSIS — Z7951 Long term (current) use of inhaled steroids: Secondary | ICD-10-CM | POA: Insufficient documentation

## 2014-05-18 DIAGNOSIS — I10 Essential (primary) hypertension: Secondary | ICD-10-CM | POA: Diagnosis not present

## 2014-05-18 DIAGNOSIS — Z1211 Encounter for screening for malignant neoplasm of colon: Secondary | ICD-10-CM | POA: Insufficient documentation

## 2014-05-18 HISTORY — PX: COLONOSCOPY: SHX5424

## 2014-05-18 SURGERY — COLONOSCOPY
Anesthesia: Moderate Sedation

## 2014-05-18 MED ORDER — FENTANYL CITRATE 0.05 MG/ML IJ SOLN
INTRAMUSCULAR | Status: AC
  Filled 2014-05-18: qty 2

## 2014-05-18 MED ORDER — DIPHENHYDRAMINE HCL 50 MG/ML IJ SOLN
INTRAMUSCULAR | Status: AC
  Filled 2014-05-18: qty 1

## 2014-05-18 MED ORDER — MIDAZOLAM HCL 5 MG/5ML IJ SOLN
INTRAMUSCULAR | Status: DC | PRN
  Administered 2014-05-18: 1 mg via INTRAVENOUS
  Administered 2014-05-18 (×4): 2 mg via INTRAVENOUS

## 2014-05-18 MED ORDER — SODIUM CHLORIDE 0.9 % IV SOLN
INTRAVENOUS | Status: DC
  Administered 2014-05-18: 500 mL via INTRAVENOUS

## 2014-05-18 MED ORDER — MIDAZOLAM HCL 10 MG/2ML IJ SOLN
INTRAMUSCULAR | Status: AC
  Filled 2014-05-18: qty 2

## 2014-05-18 MED ORDER — FENTANYL CITRATE 0.05 MG/ML IJ SOLN
INTRAMUSCULAR | Status: DC | PRN
  Administered 2014-05-18 (×4): 25 ug via INTRAVENOUS

## 2014-05-18 NOTE — Discharge Instructions (Signed)
Colonoscopy, Care After °These instructions give you information on caring for yourself after your procedure. Your doctor may also give you more specific instructions. Call your doctor if you have any problems or questions after your procedure. °HOME CARE °· Do not drive for 24 hours. °· Do not sign important papers or use machinery for 24 hours. °· You may shower. °· You may go back to your usual activities, but go slower for the first 24 hours. °· Take rest breaks often during the first 24 hours. °· Walk around or use warm packs on your belly (abdomen) if you have belly cramping or gas. °· Drink enough fluids to keep your pee (urine) clear or pale yellow. °· Resume your normal diet. Avoid heavy or fried foods. °· Avoid drinking alcohol for 24 hours or as told by your doctor. °· Only take medicines as told by your doctor. °If a tissue sample (biopsy) was taken during the procedure:  °· Do not take aspirin or blood thinners for 7 days, or as told by your doctor. °· Do not drink alcohol for 7 days, or as told by your doctor. °· Eat soft foods for the first 24 hours. °GET HELP IF: °You still have a small amount of blood in your poop (stool) 2-3 days after the procedure. °GET HELP RIGHT AWAY IF: °· You have more than a small amount of blood in your poop. °· You see clumps of tissue (blood clots) in your poop. °· Your belly is puffy (swollen). °· You feel sick to your stomach (nauseous) or throw up (vomit). °· You have a fever. °· You have belly pain that gets worse and medicine does not help. °MAKE SURE YOU: °· Understand these instructions. °· Will watch your condition. °· Will get help right away if you are not doing well or get worse. °Document Released: 04/25/2010 Document Revised: 03/28/2013 Document Reviewed: 11/28/2012 °[employer]® Patient Information ©2015 [employer]. This information is not intended to replace advice given to you by your health care provider. Make sure you discuss any questions you have with  your health care provider. ° °

## 2014-05-18 NOTE — Interval H&P Note (Signed)
History and Physical Interval Note:  05/18/2014 8:45 AM  Renee Hawkins  has presented today for surgery, with the diagnosis of History of adenomatous colon polyps  The various methods of treatment have been discussed with the patient and family. After consideration of risks, benefits and other options for treatment, the patient has consented to  Procedure(s): COLONOSCOPY (N/A) as a surgical intervention .  The patient's history has been reviewed, patient examined, no change in status, stable for surgery.  I have reviewed the patient's chart and labs.  Questions were answered to the patient's satisfaction.     Delfin Edis

## 2014-05-21 ENCOUNTER — Encounter: Payer: Self-pay | Admitting: Internal Medicine

## 2014-05-21 ENCOUNTER — Encounter (HOSPITAL_COMMUNITY): Payer: Self-pay | Admitting: Internal Medicine

## 2014-05-22 ENCOUNTER — Other Ambulatory Visit: Payer: Self-pay | Admitting: Internal Medicine

## 2014-05-23 DIAGNOSIS — M9904 Segmental and somatic dysfunction of sacral region: Secondary | ICD-10-CM | POA: Diagnosis not present

## 2014-05-23 DIAGNOSIS — M5136 Other intervertebral disc degeneration, lumbar region: Secondary | ICD-10-CM | POA: Diagnosis not present

## 2014-05-31 ENCOUNTER — Ambulatory Visit: Payer: Self-pay | Admitting: Internal Medicine

## 2014-06-07 ENCOUNTER — Ambulatory Visit: Payer: Self-pay | Admitting: Physician Assistant

## 2014-06-14 DIAGNOSIS — R918 Other nonspecific abnormal finding of lung field: Secondary | ICD-10-CM | POA: Diagnosis not present

## 2014-06-14 DIAGNOSIS — R911 Solitary pulmonary nodule: Secondary | ICD-10-CM | POA: Diagnosis not present

## 2014-06-19 DIAGNOSIS — J449 Chronic obstructive pulmonary disease, unspecified: Secondary | ICD-10-CM | POA: Diagnosis not present

## 2014-06-19 DIAGNOSIS — R911 Solitary pulmonary nodule: Secondary | ICD-10-CM | POA: Diagnosis not present

## 2014-06-19 DIAGNOSIS — G4730 Sleep apnea, unspecified: Secondary | ICD-10-CM | POA: Diagnosis not present

## 2014-06-19 DIAGNOSIS — G4734 Idiopathic sleep related nonobstructive alveolar hypoventilation: Secondary | ICD-10-CM | POA: Diagnosis not present

## 2014-06-21 DIAGNOSIS — I83813 Varicose veins of bilateral lower extremities with pain: Secondary | ICD-10-CM | POA: Diagnosis not present

## 2014-06-25 ENCOUNTER — Other Ambulatory Visit: Payer: Self-pay | Admitting: Internal Medicine

## 2014-06-26 ENCOUNTER — Ambulatory Visit: Payer: Self-pay | Admitting: Physician Assistant

## 2014-06-27 ENCOUNTER — Encounter: Payer: Self-pay | Admitting: Physician Assistant

## 2014-06-27 ENCOUNTER — Ambulatory Visit (INDEPENDENT_AMBULATORY_CARE_PROVIDER_SITE_OTHER): Payer: Medicare Other | Admitting: Physician Assistant

## 2014-06-27 VITALS — BP 128/68 | HR 76 | Temp 97.7°F | Resp 16 | Ht 63.0 in | Wt 125.0 lb

## 2014-06-27 DIAGNOSIS — N182 Chronic kidney disease, stage 2 (mild): Secondary | ICD-10-CM | POA: Diagnosis not present

## 2014-06-27 DIAGNOSIS — E785 Hyperlipidemia, unspecified: Secondary | ICD-10-CM | POA: Diagnosis not present

## 2014-06-27 DIAGNOSIS — R7303 Prediabetes: Secondary | ICD-10-CM

## 2014-06-27 DIAGNOSIS — I10 Essential (primary) hypertension: Secondary | ICD-10-CM

## 2014-06-27 DIAGNOSIS — R7309 Other abnormal glucose: Secondary | ICD-10-CM

## 2014-06-27 DIAGNOSIS — E559 Vitamin D deficiency, unspecified: Secondary | ICD-10-CM

## 2014-06-27 DIAGNOSIS — J439 Emphysema, unspecified: Secondary | ICD-10-CM

## 2014-06-27 DIAGNOSIS — Z79899 Other long term (current) drug therapy: Secondary | ICD-10-CM

## 2014-06-27 LAB — CBC WITH DIFFERENTIAL/PLATELET
BASOS ABS: 0.1 10*3/uL (ref 0.0–0.1)
BASOS PCT: 1 % (ref 0–1)
Eosinophils Absolute: 0.1 10*3/uL (ref 0.0–0.7)
Eosinophils Relative: 2 % (ref 0–5)
HEMATOCRIT: 37.7 % (ref 36.0–46.0)
HEMOGLOBIN: 12.5 g/dL (ref 12.0–15.0)
Lymphocytes Relative: 33 % (ref 12–46)
Lymphs Abs: 1.8 10*3/uL (ref 0.7–4.0)
MCH: 30.3 pg (ref 26.0–34.0)
MCHC: 33.2 g/dL (ref 30.0–36.0)
MCV: 91.3 fL (ref 78.0–100.0)
MONOS PCT: 6 % (ref 3–12)
MPV: 10.9 fL (ref 8.6–12.4)
Monocytes Absolute: 0.3 10*3/uL (ref 0.1–1.0)
Neutro Abs: 3.2 10*3/uL (ref 1.7–7.7)
Neutrophils Relative %: 58 % (ref 43–77)
Platelets: 214 10*3/uL (ref 150–400)
RBC: 4.13 MIL/uL (ref 3.87–5.11)
RDW: 13.7 % (ref 11.5–15.5)
WBC: 5.5 10*3/uL (ref 4.0–10.5)

## 2014-06-27 MED ORDER — RANITIDINE HCL 300 MG PO TABS: ORAL_TABLET | ORAL | Status: DC

## 2014-06-27 NOTE — Progress Notes (Signed)
Assessment and Plan:  1. Hypertension -Continue medication, monitor blood pressure at home. Continue DASH diet.  Reminder to go to the ER if any CP, SOB, nausea, dizziness, severe HA, changes vision/speech, left arm numbness and tingling and jaw pain.  2. Cholesterol -Continue diet and exercise. Check cholesterol.   3. Prediabetes  -Continue diet and exercise. Check A1C  4. Vitamin D Def - check level and continue medications.   5. COPD-? OSA Given information  6. GERD  will try to stop PPI and start on H2  Continue diet and meds as discussed. Further disposition pending results of labs. Over 30 minutes of exam, counseling, chart review, and critical decision making was performed  HPI 74 y.o. female  presents for 3 month follow up on hypertension, cholesterol, prediabetes, and vitamin D deficiency.   Her blood pressure has been controlled at home, today their BP is BP: 128/68 mmHg  She does workout, joined [employer], walking 2 miles on treadmill 3-4 times a week. She denies chest pain, shortness of breath, dizziness.  She is on cholesterol medication, lipitor 80 1/2 a pill 3 days a week and denies myalgias. Her cholesterol is at goal. The cholesterol last visit was:   Lab Results  Component Value Date   CHOL 168 03/06/2014   HDL 44 03/06/2014   LDLCALC 83 03/06/2014   TRIG 207* 03/06/2014   CHOLHDL 3.8 03/06/2014   She has been working on diet and exercise for prediabetes, and denies polydipsia, polyuria and visual disturbances. Last A1C in the office was:  Lab Results  Component Value Date   HGBA1C 5.7* 03/06/2014  Patient is on Vitamin D supplement.   Lab Results  Component Value Date   VD25OH 62 03/06/2014   She rarely takes the lorazepam for sleep.  She is on potassium twice a day or 68meq total.  She is on spiriva 2puffs a day but does not take QVAR.  She is on prilosec 40mg  QD for dysphagia that helps.   Current Medications:  Current Outpatient Prescriptions  on File Prior to Visit  Medication Sig Dispense Refill  . atorvastatin (LIPITOR) 80 MG tablet TAKE ONE (1) TABLET EACH DAY FOR CHOLESTEROL 30 tablet 0  . beclomethasone (QVAR) 80 MCG/ACT inhaler Inhale 2 puffs into the lungs 2 (two) times daily.    . Cholecalciferol 4000 UNITS CAPS Take 1 capsule by mouth daily.    . Ferrous Sulfate Dried (SLOW RELEASE IRON) 45 MG TBCR Take by mouth daily.    Marland Kitchen gabapentin (NEURONTIN) 100 MG capsule TAKE 1 TO 2 CAPSULES 4 TIMES A DAY FOR NERVE PAIN 240 capsule 2  . LORazepam (ATIVAN) 2 MG tablet Take 1 tablet (2 mg total) by mouth at bedtime as needed for anxiety. 90 tablet 0  . Magnesium 300 MG CAPS Take 1 capsule by mouth 3 (three) times daily.     Marland Kitchen omeprazole (PRILOSEC) 40 MG capsule Take 40 mg by mouth daily.    . potassium chloride (MICRO-K) 10 MEQ CR capsule TAKE TWO CAPSULES EACH DAY 180 capsule 1  . tiotropium (SPIRIVA) 18 MCG inhalation capsule Place 18 mcg into inhaler and inhale daily.    . verapamil (CALAN-SR) 240 MG CR tablet TAKE ONE (1) TABLET EACH DAY AFTER A MEAL FOR BLOOD PRESSURE. 90 tablet 1   No current facility-administered medications on file prior to visit.   Medical History:  Past Medical History  Diagnosis Date  . Hypomagnesemia   . Neuropathy   . Diverticulosis   .  Hx of adenomatous colonic polyps   . Hypertension   . GERD (gastroesophageal reflux disease)   . Hyperlipidemia   . Prediabetes   . Vitamin D deficiency   . RLS (restless legs syndrome)   . Vitamin B12 deficiency   . Anxiety   . COPD (chronic obstructive pulmonary disease)    Allergies:  Allergies  Allergen Reactions  . Atenolol   . Clonazepam   . Levaquin [Levofloxacin In D5w]   . Vasotec [Enalapril]     Review of Systems:  Review of Systems  Constitutional: Negative.   HENT: Negative.        + Snoring  Respiratory: Positive for shortness of breath. Negative for cough, hemoptysis, sputum production and wheezing.   Cardiovascular: Negative.    Gastrointestinal: Positive for abdominal pain (with distension) and constipation. Negative for heartburn, nausea, vomiting, diarrhea, blood in stool and melena.  Genitourinary: Negative.   Musculoskeletal: Negative.   Skin: Negative.   Neurological: Positive for sensory change (bilateral legs). Negative for dizziness, tingling, tremors, speech change, focal weakness, seizures and loss of consciousness.  Psychiatric/Behavioral: Negative.     Family history- Review and unchanged Social history- Review and unchanged Physical Exam: BP 128/68 mmHg  Pulse 76  Temp(Src) 97.7 F (36.5 C)  Resp 16  Ht 5\' 3"  (1.6 m)  Wt 125 lb (56.7 kg)  BMI 22.15 kg/m2 Wt Readings from Last 3 Encounters:  06/27/14 125 lb (56.7 kg)  04/16/14 129 lb (58.514 kg)  04/03/14 130 lb 12.8 oz (59.33 kg)   General Appearance: Well nourished, in no apparent distress. Eyes: PERRLA, EOMs, conjunctiva no swelling or erythema Sinuses: No Frontal/maxillary tenderness ENT/Mouth: Ext aud canals clear, TMs without erythema, bulging. No erythema, swelling, or exudate on post pharynx.  Tonsils not swollen or erythematous. Hearing normal.  Neck: Supple, thyroid normal.  Respiratory: Respiratory effort normal, BS decreased diffusely without rales, rhonchi, wheezing or stridor.  Cardio: RRR with no MRGs. Brisk peripheral pulses without edema.  Abdomen: Soft, + BS, distended, diffuse tenderness, no guarding, no rebound, no hernias, no masses. Lymphatics: Non tender without lymphadenopathy.  Musculoskeletal: Full ROM, 5/5 strength, Normal gait Skin: Warm, dry without rashes, lesions, ecchymosis.  Neuro: Cranial nerves intact. Normal muscle tone, no cerebellar symptoms. Psych: Awake and oriented X 3, normal affect, Insight and Judgment appropriate.    Vicie Mutters, PA-C 1:42 PM [facility]

## 2014-06-27 NOTE — Patient Instructions (Addendum)
Nexium/protonix/prilosec are called PPI's, they are great at healing your stomach but should only be taken for a short period of time.   Studies are showing that taken for a long time it can increase the risk of osteoporosis (weakening of your bones), pneumonia, low magnesium, restless legs, Cdiff (infection that causes diarrhea), and most recently kidney disease/insufficiency.  Due to this information we want to try to stop the PPI but if you try to stop it abruptly this can cause rebound acid and worsening symptoms.   So this is how we want you to get off the PPI: - Start taking the nexium/protonix/prilosec/PPI  every other day with pepcid or zantac (generic is fine) 2 x a day for 2 weeks - then decrease the PPI to every 3 days while taking the zantac or pepcid twice a day the other 2 days for 2 weeks - then you can try the zantac or pepcid once at night for 2 weeks - you can continue on this once at night or stop all together - Avoid alcohol, spicy foods, NSAIDS (aleve, ibuprofen) at this time. See foods below.   Food Choices for Gastroesophageal Reflux Disease When you have gastroesophageal reflux disease (GERD), the foods you eat and your eating habits are very important. Choosing the right foods can help ease the discomfort of GERD. WHAT GENERAL GUIDELINES DO I NEED TO FOLLOW?  Choose fruits, vegetables, whole grains, low-fat dairy products, and low-fat meat, fish, and poultry.  Limit fats such as oils, salad dressings, butter, nuts, and avocado.  Keep a food diary to identify foods that cause symptoms.  Avoid foods that cause reflux. These may be different for different people.  Eat frequent small meals instead of three large meals each day.  Eat your meals slowly, in a relaxed setting.  Limit fried foods.  Cook foods using methods other than frying.  Avoid drinking alcohol.  Avoid drinking large amounts of liquids with your meals.  Avoid bending over or lying down until  2-3 hours after eating. WHAT FOODS ARE NOT RECOMMENDED? The following are some foods and drinks that may worsen your symptoms: Vegetables Tomatoes. Tomato juice. Tomato and spaghetti sauce. Chili peppers. Onion and garlic. Horseradish. Fruits Oranges, grapefruit, and lemon (fruit and juice). Meats High-fat meats, fish, and poultry. This includes hot dogs, ribs, ham, sausage, salami, and bacon. Dairy Whole milk and chocolate milk. Sour cream. Cream. Butter. Ice cream. Cream cheese.  Beverages Coffee and tea, with or without caffeine. Carbonated beverages or energy drinks. Condiments Hot sauce. Barbecue sauce.  Sweets/Desserts Chocolate and cocoa. Donuts. Peppermint and spearmint. Fats and Oils High-fat foods, including [place] fries and potato chips. Other Vinegar. Strong spices, such as black pepper, white pepper, red pepper, cayenne, curry powder, cloves, ginger, and chili powder.  Potassium Content of Foods  The body needs potassium to control blood pressure and to keep the muscles and nervous system healthy. Here are some healthy foods below that are high in potassium. Also you can get the white label salt of "NO SALT" salt substitute, 1/4 teaspoon of this is equivalent to 59meq potassium.   FOODS AND DRINKS HIGH IN POTASSIUM FOODS MODERATE IN POTASSIUM   Fruits  Avocado (cubed),  c / 50 g.  Banana (sliced), 75 g.  Cantaloupe (cubed), 80 g.  Honeydew, 1 wedge / 85 g.  Kiwi (sliced), 90 g.  Nectarine, 1 small / 129 g.  Orange, 1 medium / 131 g. Vegetables  Artichoke,  of a medium / 64  g.  Asparagus (boiled), 90 g..  Broccoli (boiled), 78 g.  Brussels sprout (boiled), 78 g.  Butternut squash (baked), 103 g.  Chickpea (cooked), 82 g.  Green peas (cooked), 80 g.  Kidney beans (cooked), 5 tbsp / 55 g.  Lima beans (cooked),  c / 43 g.  Navy beans (cooked),  c / 61 g.  Spinach (cooked),  c / 45 g.  Sweet potato (baked),  c / 50 g.  Tomato (chopped or  sliced), 90 g.  Vegetable juice.  White mushrooms (cooked), 78 g.  Yam (cooked or baked),  c / 34 g.  Zucchini squash (boiled), 90 g. Other Foods and Drinks  Almonds (whole),  c / 36 g.  Fish, 3 oz / 85 g.  Nonfat fruit variety yogurt, 123 g.  Pistachio nuts, 1 oz / 28 g.  Pumpkin seeds, 1 oz / 28 g.  Red meat (broiled, cooked, grilled), 3 oz / 85 g.  Scallops (steamed), 3 oz / 85 g.  Spaghetti sauce,  c / 66 g.  Sunflower seeds (dry roasted), 1 oz / 28 g.  Veggie burger, 1 patty / 70 g. Fruits  Grapefruit,  of the fruit / 341 g  Plums (sliced), 83 g.  Tangerine, 1 large / 120 g. Vegetables  Carrots (boiled), 78 g.  Carrots (sliced), 61 g.  Rhubarb (cooked with sugar), 120 g.  R[place]abaga (cooked), 120 g.  Yellow snap beans (cooked), 63 g. Other Foods and Drinks   Chicken breast (roasted and chopped),  c / 70 g.  Pita bread, 1 large / 64 g.  Shrimp (steamed), 4 oz / 113 g.  Swiss cheese (diced), 70 g.      Can call Dr. Toy Cookey to get evaluated for dental sleep appliance. # [phone]  OR you can try Dr. Clearence Ped in [facility] # [phone]  We will send notes. Call and get price quote on both.   I think it is possible that you have sleep apnea. It can cause interrupted sleep, headaches, frequent awakenings, fatigue, dry mouth, fast/slow heart beats, memory issues, anxiety/depression, swelling, numbness tingling hands/feet, weight gain, shortness of breath, and the list goes on. Sleep apnea needs to be ruled out because if it is left untreated it does eventually lead to abnormal heart beats, lung failure or heart failure as well as increasing the risk of heart attack and stroke. There are masks you can wear OR a mouth piece that I can give you information about. Often times though people feel MUCH better after getting treatment.   Sleep Apnea  Sleep apnea is a sleep disorder characterized by abnormal pauses in breathing while you sleep.  When your breathing pauses, the level of oxygen in your blood decreases. This causes you to move out of deep sleep and into light sleep. As a result, your quality of sleep is poor, and the system that carries your blood throughout your body (cardiovascular system) experiences stress. If sleep apnea remains untreated, the following conditions can develop:  High blood pressure (hypertension).  Coronary artery disease.  Inability to achieve or maintain an erection (impotence).  Impairment of your thought process (cognitive dysfunction). There are three types of sleep apnea: 1. Obstructive sleep apnea--Pauses in breathing during sleep because of a blocked airway. 2. Central sleep apnea--Pauses in breathing during sleep because the area of the brain that controls your breathing does not send the correct signals to the muscles that control breathing. 3. Mixed sleep  apnea--A combination of both obstructive and central sleep apnea.  RISK FACTORS The following risk factors can increase your risk of developing sleep apnea:  Being overweight.  Smoking.  Having narrow passages in your nose and throat.  Being of older age.  Being female.  Alcohol use.  Sedative and tranquilizer use.  Ethnicity. Among individuals younger than 35 years, African Americans are at increased risk of sleep apnea. SYMPTOMS   Difficulty staying asleep.  Daytime sleepiness and fatigue.  Loss of energy.  Irritability.  Loud, heavy snoring.  Morning headaches.  Trouble concentrating.  Forgetfulness.  Decreased interest in sex. DIAGNOSIS  In order to diagnose sleep apnea, your caregiver will perform a physical examination. Your caregiver may suggest that you take a home sleep test. Your caregiver may also recommend that you spend the night in a sleep lab. In the sleep lab, several monitors record information about your heart, lungs, and brain while you sleep. Your leg and arm movements and blood oxygen level  are also recorded. TREATMENT The following actions may help to resolve mild sleep apnea:  Sleeping on your side.   Using a decongestant if you have nasal congestion.   Avoiding the use of depressants, including alcohol, sedatives, and narcotics.   Losing weight and modifying your diet if you are overweight. There also are devices and treatments to help open your airway:  Oral appliances. These are custom-made mouthpieces that shift your lower jaw forward and slightly open your bite. This opens your airway.  Devices that create positive airway pressure. This positive pressure "splints" your airway open to help you breathe better during sleep. The following devices create positive airway pressure:  Continuous positive airway pressure (CPAP) device. The CPAP device creates a continuous level of air pressure with an air pump. The air is delivered to your airway through a mask while you sleep. This continuous pressure keeps your airway open.  Nasal expiratory positive airway pressure (EPAP) device. The EPAP device creates positive air pressure as you exhale. The device consists of single-use valves, which are inserted into each nostril and held in place by adhesive. The valves create very little resistance when you inhale but create much more resistance when you exhale. That increased resistance creates the positive airway pressure. This positive pressure while you exhale keeps your airway open, making it easier to breath when you inhale again.  Bilevel positive airway pressure (BPAP) device. The BPAP device is used mainly in patients with central sleep apnea. This device is similar to the CPAP device because it also uses an air pump to deliver continuous air pressure through a mask. However, with the BPAP machine, the pressure is set at two different levels. The pressure when you exhale is lower than the pressure when you inhale.  Surgery. Typically, surgery is only done if you cannot comply  with less invasive treatments or if the less invasive treatments do not improve your condition. Surgery involves removing excess tissue in your airway to create a wider passage way. Document Released: 03/13/2002 Document Revised: 07/18/2012 Document Reviewed: 07/30/2011 [facility] Patient Information 2015 [place], [place]. This information is not intended to replace advice given to you by your health care provider. Make sure you discuss any questions you have with your health care provider.

## 2014-06-28 LAB — INSULIN, FASTING: INSULIN FASTING, SERUM: 42.7 u[IU]/mL — AB (ref 2.0–19.6)

## 2014-06-28 LAB — HEMOGLOBIN A1C
Hgb A1c MFr Bld: 5.7 % — ABNORMAL HIGH (ref <5.7)
Mean Plasma Glucose: 117 mg/dL — ABNORMAL HIGH (ref <117)

## 2014-06-28 LAB — HEPATIC FUNCTION PANEL
ALBUMIN: 4 g/dL (ref 3.5–5.2)
ALT: 14 U/L (ref 0–35)
AST: 17 U/L (ref 0–37)
Alkaline Phosphatase: 123 U/L — ABNORMAL HIGH (ref 39–117)
BILIRUBIN INDIRECT: 0.3 mg/dL (ref 0.2–1.2)
Bilirubin, Direct: 0.1 mg/dL (ref 0.0–0.3)
TOTAL PROTEIN: 6.5 g/dL (ref 6.0–8.3)
Total Bilirubin: 0.4 mg/dL (ref 0.2–1.2)

## 2014-06-28 LAB — BASIC METABOLIC PANEL WITH GFR
BUN: 16 mg/dL (ref 6–23)
CHLORIDE: 100 meq/L (ref 96–112)
CO2: 29 mEq/L (ref 19–32)
Calcium: 9.1 mg/dL (ref 8.4–10.5)
Creat: 0.82 mg/dL (ref 0.50–1.10)
GFR, Est African American: 83 mL/min
GFR, Est Non African American: 72 mL/min
GLUCOSE: 87 mg/dL (ref 70–99)
POTASSIUM: 4.1 meq/L (ref 3.5–5.3)
Sodium: 142 mEq/L (ref 135–145)

## 2014-06-28 LAB — MAGNESIUM: Magnesium: 1.8 mg/dL (ref 1.5–2.5)

## 2014-06-28 LAB — LIPID PANEL
CHOLESTEROL: 177 mg/dL (ref 0–200)
HDL: 39 mg/dL — ABNORMAL LOW (ref >=46)
LDL Cholesterol: 88 mg/dL (ref 0–99)
Total CHOL/HDL Ratio: 4.5 Ratio
Triglycerides: 248 mg/dL — ABNORMAL HIGH (ref <150)
VLDL: 50 mg/dL — AB (ref 0–40)

## 2014-06-28 LAB — TSH: TSH: 1.953 u[IU]/mL (ref 0.350–4.500)

## 2014-07-02 DIAGNOSIS — I83892 Varicose veins of left lower extremities with other complications: Secondary | ICD-10-CM | POA: Diagnosis not present

## 2014-07-02 DIAGNOSIS — I83812 Varicose veins of left lower extremities with pain: Secondary | ICD-10-CM | POA: Diagnosis not present

## 2014-07-09 DIAGNOSIS — I83812 Varicose veins of left lower extremities with pain: Secondary | ICD-10-CM | POA: Diagnosis not present

## 2014-07-09 DIAGNOSIS — I8312 Varicose veins of left lower extremity with inflammation: Secondary | ICD-10-CM | POA: Diagnosis not present

## 2014-07-12 DIAGNOSIS — I83812 Varicose veins of left lower extremities with pain: Secondary | ICD-10-CM | POA: Diagnosis not present

## 2014-07-12 DIAGNOSIS — I8312 Varicose veins of left lower extremity with inflammation: Secondary | ICD-10-CM | POA: Diagnosis not present

## 2014-07-26 DIAGNOSIS — M5136 Other intervertebral disc degeneration, lumbar region: Secondary | ICD-10-CM | POA: Diagnosis not present

## 2014-07-26 DIAGNOSIS — M9904 Segmental and somatic dysfunction of sacral region: Secondary | ICD-10-CM | POA: Diagnosis not present

## 2014-08-03 DIAGNOSIS — I83812 Varicose veins of left lower extremities with pain: Secondary | ICD-10-CM | POA: Diagnosis not present

## 2014-08-03 DIAGNOSIS — I8312 Varicose veins of left lower extremity with inflammation: Secondary | ICD-10-CM | POA: Diagnosis not present

## 2014-08-14 ENCOUNTER — Other Ambulatory Visit: Payer: Self-pay | Admitting: *Deleted

## 2014-08-14 MED ORDER — ATORVASTATIN CALCIUM 80 MG PO TABS: ORAL_TABLET | ORAL | Status: DC

## 2014-08-16 DIAGNOSIS — H524 Presbyopia: Secondary | ICD-10-CM | POA: Diagnosis not present

## 2014-08-16 DIAGNOSIS — H2513 Age-related nuclear cataract, bilateral: Secondary | ICD-10-CM | POA: Diagnosis not present

## 2014-08-20 DIAGNOSIS — I83812 Varicose veins of left lower extremities with pain: Secondary | ICD-10-CM | POA: Diagnosis not present

## 2014-08-20 DIAGNOSIS — I8312 Varicose veins of left lower extremity with inflammation: Secondary | ICD-10-CM | POA: Diagnosis not present

## 2014-08-22 DIAGNOSIS — M5136 Other intervertebral disc degeneration, lumbar region: Secondary | ICD-10-CM | POA: Diagnosis not present

## 2014-08-22 DIAGNOSIS — M9904 Segmental and somatic dysfunction of sacral region: Secondary | ICD-10-CM | POA: Diagnosis not present

## 2014-09-11 DIAGNOSIS — M9904 Segmental and somatic dysfunction of sacral region: Secondary | ICD-10-CM | POA: Diagnosis not present

## 2014-09-11 DIAGNOSIS — M5136 Other intervertebral disc degeneration, lumbar region: Secondary | ICD-10-CM | POA: Diagnosis not present

## 2014-09-12 DIAGNOSIS — I83812 Varicose veins of left lower extremities with pain: Secondary | ICD-10-CM | POA: Diagnosis not present

## 2014-09-12 DIAGNOSIS — M7981 Nontraumatic hematoma of soft tissue: Secondary | ICD-10-CM | POA: Diagnosis not present

## 2014-09-25 DIAGNOSIS — M9904 Segmental and somatic dysfunction of sacral region: Secondary | ICD-10-CM | POA: Diagnosis not present

## 2014-09-25 DIAGNOSIS — M5136 Other intervertebral disc degeneration, lumbar region: Secondary | ICD-10-CM | POA: Diagnosis not present

## 2014-10-09 DIAGNOSIS — G4730 Sleep apnea, unspecified: Secondary | ICD-10-CM | POA: Diagnosis not present

## 2014-10-09 DIAGNOSIS — G4733 Obstructive sleep apnea (adult) (pediatric): Secondary | ICD-10-CM | POA: Diagnosis not present

## 2014-10-09 DIAGNOSIS — G4734 Idiopathic sleep related nonobstructive alveolar hypoventilation: Secondary | ICD-10-CM | POA: Diagnosis not present

## 2014-10-09 DIAGNOSIS — G479 Sleep disorder, unspecified: Secondary | ICD-10-CM | POA: Diagnosis not present

## 2014-10-10 DIAGNOSIS — R911 Solitary pulmonary nodule: Secondary | ICD-10-CM | POA: Diagnosis not present

## 2014-10-10 DIAGNOSIS — G4733 Obstructive sleep apnea (adult) (pediatric): Secondary | ICD-10-CM | POA: Diagnosis not present

## 2014-10-10 DIAGNOSIS — J449 Chronic obstructive pulmonary disease, unspecified: Secondary | ICD-10-CM | POA: Diagnosis not present

## 2014-10-17 DIAGNOSIS — M9904 Segmental and somatic dysfunction of sacral region: Secondary | ICD-10-CM | POA: Diagnosis not present

## 2014-10-17 DIAGNOSIS — M5136 Other intervertebral disc degeneration, lumbar region: Secondary | ICD-10-CM | POA: Diagnosis not present

## 2014-11-05 ENCOUNTER — Other Ambulatory Visit: Payer: Self-pay | Admitting: Internal Medicine

## 2014-11-21 DIAGNOSIS — G4733 Obstructive sleep apnea (adult) (pediatric): Secondary | ICD-10-CM | POA: Diagnosis not present

## 2014-11-21 DIAGNOSIS — J449 Chronic obstructive pulmonary disease, unspecified: Secondary | ICD-10-CM | POA: Diagnosis not present

## 2014-12-04 ENCOUNTER — Other Ambulatory Visit: Payer: Self-pay | Admitting: Internal Medicine

## 2014-12-05 ENCOUNTER — Encounter: Payer: Self-pay | Admitting: Physician Assistant

## 2014-12-05 ENCOUNTER — Ambulatory Visit (INDEPENDENT_AMBULATORY_CARE_PROVIDER_SITE_OTHER): Payer: Medicare Other | Admitting: Physician Assistant

## 2014-12-05 VITALS — BP 122/70 | HR 80 | Temp 97.3°F | Resp 16 | Ht 63.0 in | Wt 123.2 lb

## 2014-12-05 DIAGNOSIS — Z9181 History of falling: Secondary | ICD-10-CM

## 2014-12-05 DIAGNOSIS — E538 Deficiency of other specified B group vitamins: Secondary | ICD-10-CM

## 2014-12-05 DIAGNOSIS — R7309 Other abnormal glucose: Secondary | ICD-10-CM

## 2014-12-05 DIAGNOSIS — Z23 Encounter for immunization: Secondary | ICD-10-CM

## 2014-12-05 DIAGNOSIS — Z0001 Encounter for general adult medical examination with abnormal findings: Secondary | ICD-10-CM | POA: Diagnosis not present

## 2014-12-05 DIAGNOSIS — R6889 Other general symptoms and signs: Secondary | ICD-10-CM

## 2014-12-05 DIAGNOSIS — G629 Polyneuropathy, unspecified: Secondary | ICD-10-CM

## 2014-12-05 DIAGNOSIS — J449 Chronic obstructive pulmonary disease, unspecified: Secondary | ICD-10-CM

## 2014-12-05 DIAGNOSIS — I10 Essential (primary) hypertension: Secondary | ICD-10-CM

## 2014-12-05 DIAGNOSIS — E559 Vitamin D deficiency, unspecified: Secondary | ICD-10-CM

## 2014-12-05 DIAGNOSIS — Z9989 Dependence on other enabling machines and devices: Secondary | ICD-10-CM

## 2014-12-05 DIAGNOSIS — Z6821 Body mass index (BMI) 21.0-21.9, adult: Secondary | ICD-10-CM | POA: Diagnosis not present

## 2014-12-05 DIAGNOSIS — J439 Emphysema, unspecified: Secondary | ICD-10-CM

## 2014-12-05 DIAGNOSIS — K5790 Diverticulosis of intestine, part unspecified, without perforation or abscess without bleeding: Secondary | ICD-10-CM

## 2014-12-05 DIAGNOSIS — E2839 Other primary ovarian failure: Secondary | ICD-10-CM

## 2014-12-05 DIAGNOSIS — Z79899 Other long term (current) drug therapy: Secondary | ICD-10-CM | POA: Diagnosis not present

## 2014-12-05 DIAGNOSIS — N182 Chronic kidney disease, stage 2 (mild): Secondary | ICD-10-CM

## 2014-12-05 DIAGNOSIS — R350 Frequency of micturition: Secondary | ICD-10-CM

## 2014-12-05 DIAGNOSIS — R7303 Prediabetes: Secondary | ICD-10-CM

## 2014-12-05 DIAGNOSIS — E785 Hyperlipidemia, unspecified: Secondary | ICD-10-CM

## 2014-12-05 DIAGNOSIS — Z86010 Personal history of colonic polyps: Secondary | ICD-10-CM

## 2014-12-05 DIAGNOSIS — G4733 Obstructive sleep apnea (adult) (pediatric): Secondary | ICD-10-CM | POA: Insufficient documentation

## 2014-12-05 DIAGNOSIS — F419 Anxiety disorder, unspecified: Secondary | ICD-10-CM

## 2014-12-05 DIAGNOSIS — G2581 Restless legs syndrome: Secondary | ICD-10-CM

## 2014-12-05 DIAGNOSIS — Z789 Other specified health status: Secondary | ICD-10-CM

## 2014-12-05 DIAGNOSIS — M810 Age-related osteoporosis without current pathological fracture: Secondary | ICD-10-CM

## 2014-12-05 DIAGNOSIS — Z0000 Encounter for general adult medical examination without abnormal findings: Secondary | ICD-10-CM

## 2014-12-05 DIAGNOSIS — K2100 Gastro-esophageal reflux disease with esophagitis, without bleeding: Secondary | ICD-10-CM

## 2014-12-05 LAB — CBC WITH DIFFERENTIAL/PLATELET
Basophils Absolute: 0 10*3/uL (ref 0.0–0.1)
Basophils Relative: 0 % (ref 0–1)
EOS PCT: 2 % (ref 0–5)
Eosinophils Absolute: 0.1 10*3/uL (ref 0.0–0.7)
HCT: 37.3 % (ref 36.0–46.0)
Hemoglobin: 12.4 g/dL (ref 12.0–15.0)
LYMPHS ABS: 1.7 10*3/uL (ref 0.7–4.0)
Lymphocytes Relative: 30 % (ref 12–46)
MCH: 30.6 pg (ref 26.0–34.0)
MCHC: 33.2 g/dL (ref 30.0–36.0)
MCV: 92.1 fL (ref 78.0–100.0)
MONOS PCT: 6 % (ref 3–12)
MPV: 10.6 fL (ref 8.6–12.4)
Monocytes Absolute: 0.3 10*3/uL (ref 0.1–1.0)
Neutro Abs: 3.5 10*3/uL (ref 1.7–7.7)
Neutrophils Relative %: 62 % (ref 43–77)
Platelets: 185 10*3/uL (ref 150–400)
RBC: 4.05 MIL/uL (ref 3.87–5.11)
RDW: 14 % (ref 11.5–15.5)
WBC: 5.7 10*3/uL (ref 4.0–10.5)

## 2014-12-05 NOTE — Patient Instructions (Signed)
Osteoporosis Throughout your life, your body breaks down old bone and replaces it with new bone. As you get older, your body does not replace bone as quickly as it breaks it down. By the age of 72 years, most people begin to gradually lose bone because of the imbalance between bone loss and replacement. Some people lose more bone than others. Bone loss beyond a specified normal degree is considered osteoporosis.  Osteoporosis affects the strength and durability of your bones. The inside of the ends of your bones and your flat bones, like the bones of your pelvis, look like honeycomb, filled with tiny open spaces. As bone loss occurs, your bones become less dense. This means that the open spaces inside your bones become bigger and the walls between these spaces become thinner. This makes your bones weaker. Bones of a person with osteoporosis can become so weak that they can break (fracture) during minor accidents, such as a simple fall. CAUSES  The following factors have been associated with the development of osteoporosis:  Smoking.  Drinking more than 2 alcoholic drinks several days per week.  Long-term use of certain medicines:  Corticosteroids.  Chemotherapy medicines.  Thyroid medicines.  Antiepileptic medicines.  Gonadal hormone suppression medicine.  Immunosuppression medicine.  Being underweight.  Lack of physical activity.  Lack of exposure to the sun. This can lead to vitamin D deficiency.  Certain medical conditions:  Certain inflammatory bowel diseases, such as Crohn disease and ulcerative colitis.  Diabetes.  Hyperthyroidism.  Hyperparathyroidism. RISK FACTORS Anyone can develop osteoporosis. However, the following factors can increase your risk of developing osteoporosis:  Gender--Women are at higher risk than men.  Age--Being older than 50 years increases your risk.  Ethnicity--White and Asian people have an increased risk.  Weight --Being extremely  underweight can increase your risk of osteoporosis.  Family history of osteoporosis--Having a family member who has developed osteoporosis can increase your risk. SYMPTOMS  Usually, people with osteoporosis have no symptoms.  DIAGNOSIS  Signs during a physical exam that may prompt your caregiver to suspect osteoporosis include:  Decreased height. This is usually caused by the compression of the bones that form your spine (vertebrae) because they have weakened and become fractured.  A curving or rounding of the upper back (kyphosis). To confirm signs of osteoporosis, your caregiver may request a procedure that uses 2 low-dose X-ray beams with different levels of energy to measure your bone mineral density (dual-energy X-ray absorptiometry [DXA]). Also, your caregiver may check your level of vitamin D. TREATMENT  The goal of osteoporosis treatment is to strengthen bones in order to decrease the risk of bone fractures. There are different types of medicines available to help achieve this goal. Some of these medicines work by slowing the processes of bone loss. Some medicines work by increasing bone density. Treatment also involves making sure that your levels of calcium and vitamin D are adequate. PREVENTION  There are things you can do to help prevent osteoporosis. Adequate intake of calcium and vitamin D can help you achieve optimal bone mineral density. Regular exercise can also help, especially resistance and weight-bearing activities. If you smoke, quitting smoking is an important part of osteoporosis prevention. MAKE SURE YOU:  Understand these instructions.  Will watch your condition.  Will get help right away if you are not doing well or get worse. FOR MORE INFORMATION www.osteo.org and [email or web address].ee Document Released: 12/31/2004 Document Revised: 07/18/2012 Document Reviewed: 03/07/2011 [facility] Patient Information 2015 [place], [place]. This information is not  intended to replace advice  given to you by your health care provider. Make sure you discuss any questions you have with your health care provider.   Preventive Care for Adults A healthy lifestyle and preventive care can promote health and wellness. Preventive health guidelines for women include the following key practices.  A routine yearly physical is a good way to check with your health care provider about your health and preventive screening. It is a chance to share any concerns and updates on your health and to receive a thorough exam.  Visit your dentist for a routine exam and preventive care every 6 months. Brush your teeth twice a day and floss once a day. Good oral hygiene prevents tooth decay and gum disease.  The frequency of eye exams is based on your age, health, family medical history, use of contact lenses, and other factors. Follow your health care provider's recommendations for frequency of eye exams.  Eat a healthy diet. Foods like vegetables, fruits, whole grains, low-fat dairy products, and lean protein foods contain the nutrients you need without too many calories. Decrease your intake of foods high in solid fats, added sugars, and salt. Eat the right amount of calories for you.Get information about a proper diet from your health care provider, if necessary.  Regular physical exercise is one of the most important things you can do for your health. Most adults should get at least 150 minutes of moderate-intensity exercise (any activity that increases your heart rate and causes you to sweat) each week. In addition, most adults need muscle-strengthening exercises on 2 or more days a week.  Maintain a healthy weight. The body mass index (BMI) is a screening tool to identify possible weight problems. It provides an estimate of body fat based on height and weight. Your health care provider can find your BMI and can help you achieve or maintain a healthy weight.For adults 20 years and older:  A BMI below 18.5  is considered underweight.  A BMI of 18.5 to 24.9 is normal.  A BMI of 25 to 29.9 is considered overweight.  A BMI of 30 and above is considered obese.  Maintain normal blood lipids and cholesterol levels by exercising and minimizing your intake of saturated fat. Eat a balanced diet with plenty of fruit and vegetables. If your lipid or cholesterol levels are high, you are over 50, or you are at high risk for heart disease, you may need your cholesterol levels checked more frequently.Ongoing high lipid and cholesterol levels should be treated with medicines if diet and exercise are not working.  If you smoke, find out from your health care provider how to quit. If you do not use tobacco, do not start.  Lung cancer screening is recommended for adults aged 38-80 years who are at high risk for developing lung cancer because of a history of smoking. A yearly low-dose CT scan of the lungs is recommended for people who have at least a 30-pack-year history of smoking and are a current smoker or have quit within the past 15 years. A pack year of smoking is smoking an average of 1 pack of cigarettes a day for 1 year (for example: 1 pack a day for 30 years or 2 packs a day for 15 years). Yearly screening should continue until the smoker has stopped smoking for at least 15 years. Yearly screening should be stopped for people who develop a health problem that would prevent them from having lung cancer treatment.  Avoid  use of street drugs. Do not share needles with anyone. Ask for help if you need support or instructions about stopping the use of drugs.  High blood pressure causes heart disease and increases the risk of stroke.  Ongoing high blood pressure should be treated with medicines if weight loss and exercise do not work.  If you are 57-63 years old, ask your health care provider if you should take aspirin to prevent strokes.  Diabetes screening involves taking a blood sample to check your fasting  blood sugar level. This should be done once every 3 years, after age 78, if you are within normal weight and without risk factors for diabetes. Testing should be considered at a younger age or be carried out more frequently if you are overweight and have at least 1 risk factor for diabetes.  Breast cancer screening is essential preventive care for women. You should practice "breast self-awareness." This means understanding the normal appearance and feel of your breasts and may include breast self-examination. Any changes detected, no matter how small, should be reported to a health care provider. Women in their 12s and 30s should have a clinical breast exam (CBE) by a health care provider as part of a regular health exam every 1 to 3 years. After age 67, women should have a CBE every year. Starting at age 4, women should consider having a mammogram (breast X-ray test) every year. Women who have a family history of breast cancer should talk to their health care provider about genetic screening. Women at a high risk of breast cancer should talk to their health care providers about having an MRI and a mammogram every year.  Breast cancer gene (BRCA)-related cancer risk assessment is recommended for women who have family members with BRCA-related cancers. BRCA-related cancers include breast, ovarian, tubal, and peritoneal cancers. Having family members with these cancers may be associated with an increased risk for harmful changes (mutations) in the breast cancer genes BRCA1 and BRCA2. Results of the assessment will determine the need for genetic counseling and BRCA1 and BRCA2 testing.  Routine pelvic exams to screen for cancer are no longer recommended for nonpregnant women who are considered low risk for cancer of the pelvic organs (ovaries, uterus, and vagina) and who do not have symptoms. Ask your health care provider if a screening pelvic exam is right for you.  If you have had past treatment for cervical  cancer or a condition that could lead to cancer, you need Pap tests and screening for cancer for at least 20 years after your treatment. If Pap tests have been discontinued, your risk factors (such as having a new sexual partner) need to be reassessed to determine if screening should be resumed. Some women have medical problems that increase the chance of getting cervical cancer. In these cases, your health care provider may recommend more frequent screening and Pap tests.    Colorectal cancer can be detected and often prevented. Most routine colorectal cancer screening begins at the age of 70 years and continues through age 58 years. However, your health care provider may recommend screening at an earlier age if you have risk factors for colon cancer. On a yearly basis, your health care provider may provide home test kits to check for hidden blood in the stool. Use of a small camera at the end of a tube, to directly examine the colon (sigmoidoscopy or colonoscopy), can detect the earliest forms of colorectal cancer. Talk to your health care provider about this  at age 36, when routine screening begins. Direct exam of the colon should be repeated every 5-10 years through age 59 years, unless early forms of pre-cancerous polyps or small growths are found.  Osteoporosis is a disease in which the bones lose minerals and strength with aging. This can result in serious bone fractures or breaks. The risk of osteoporosis can be identified using a bone density scan. Women ages 5 years and over and women at risk for fractures or osteoporosis should discuss screening with their health care providers. Ask your health care provider whether you should take a calcium supplement or vitamin D to reduce the rate of osteoporosis.  Menopause can be associated with physical symptoms and risks. Hormone replacement therapy is available to decrease symptoms and risks. You should talk to your health care provider about whether  hormone replacement therapy is right for you.  Use sunscreen. Apply sunscreen liberally and repeatedly throughout the day. You should seek shade when your shadow is shorter than you. Protect yourself by wearing long sleeves, pants, a wide-brimmed hat, and sunglasses year round, whenever you are outdoors.  Once a month, do a whole body skin exam, using a mirror to look at the skin on your back. Tell your health care provider of new moles, moles that have irregular borders, moles that are larger than a pencil eraser, or moles that have changed in shape or color.  Stay current with required vaccines (immunizations).  Influenza vaccine. All adults should be immunized every year.  Tetanus, diphtheria, and acellular pertussis (Td, Tdap) vaccine. Pregnant women should receive 1 dose of Tdap vaccine during each pregnancy. The dose should be obtained regardless of the length of time since the last dose. Immunization is preferred during the 27th-36th week of gestation. An adult who has not previously received Tdap or who does not know her vaccine status should receive 1 dose of Tdap. This initial dose should be followed by tetanus and diphtheria toxoids (Td) booster doses every 10 years. Adults with an unknown or incomplete history of completing a 3-dose immunization series with Td-containing vaccines should begin or complete a primary immunization series including a Tdap dose. Adults should receive a Td booster every 10 years.    Zoster vaccine. One dose is recommended for adults aged 69 years or older unless certain conditions are present.    Pneumococcal 13-valent conjugate (PCV13) vaccine. When indicated, a person who is uncertain of her immunization history and has no record of immunization should receive the PCV13 vaccine. An adult aged 66 years or older who has certain medical conditions and has not been previously immunized should receive 1 dose of PCV13 vaccine. This PCV13 should be followed with a  dose of pneumococcal polysaccharide (PPSV23) vaccine. The PPSV23 vaccine dose should be obtained at least 8 weeks after the dose of PCV13 vaccine. An adult aged 89 years or older who has certain medical conditions and previously received 1 or more doses of PPSV23 vaccine should receive 1 dose of PCV13. The PCV13 vaccine dose should be obtained 1 or more years after the last PPSV23 vaccine dose.    Pneumococcal polysaccharide (PPSV23) vaccine. When PCV13 is also indicated, PCV13 should be obtained first. All adults aged 81 years and older should be immunized. An adult younger than age 71 years who has certain medical conditions should be immunized. Any person who resides in a nursing home or long-term care facility should be immunized. An adult smoker should be immunized. People with an immunocompromised condition and  certain other conditions should receive both PCV13 and PPSV23 vaccines. People with human immunodeficiency virus (HIV) infection should be immunized as soon as possible after diagnosis. Immunization during chemotherapy or radiation therapy should be avoided. Routine use of PPSV23 vaccine is not recommended for American Indians, [place] Natives, or people younger than 65 years unless there are medical conditions that require PPSV23 vaccine. When indicated, people who have unknown immunization and have no record of immunization should receive PPSV23 vaccine. One-time revaccination 5 years after the first dose of PPSV23 is recommended for people aged 19-64 years who have chronic kidney failure, nephrotic syndrome, asplenia, or immunocompromised conditions. People who received 1-2 doses of PPSV23 before age 2 years should receive another dose of PPSV23 vaccine at age 97 years or later if at least 5 years have passed since the previous dose. Doses of PPSV23 are not needed for people immunized with PPSV23 at or after age 72 years.   Preventive Services / Frequency  Ages 87 years and over  Blood  pressure check.  Lipid and cholesterol check.  Lung cancer screening. / Every year if you are aged 37-80 years and have a 30-pack-year history of smoking and currently smoke or have quit within the past 15 years. Yearly screening is stopped once you have quit smoking for at least 15 years or develop a health problem that would prevent you from having lung cancer treatment.  Clinical breast exam.** / Every year after age 48 years.  BRCA-related cancer risk assessment.** / For women who have family members with a BRCA-related cancer (breast, ovarian, tubal, or peritoneal cancers).  Mammogram.** / Every year beginning at age 65 years and continuing for as long as you are in good health. Consult with your health care provider.  Pap test.** / Every 3 years starting at age 29 years through age 94 or 32 years with 3 consecutive normal Pap tests. Testing can be stopped between 65 and 70 years with 3 consecutive normal Pap tests and no abnormal Pap or HPV tests in the past 10 years.  Fecal occult blood test (FOBT) of stool. / Every year beginning at age 69 years and continuing until age 68 years. You may not need to do this test if you get a colonoscopy every 10 years.  Flexible sigmoidoscopy or colonoscopy.** / Every 5 years for a flexible sigmoidoscopy or every 10 years for a colonoscopy beginning at age 68 years and continuing until age 79 years.  Hepatitis C blood test.** / For all people born from [birth year] through 1963 and any individual with known risks for hepatitis C.  Osteoporosis screening.** / A one-time screening for women ages 44 years and over and women at risk for fractures or osteoporosis.  Skin self-exam. / Monthly.  Influenza vaccine. / Every year.  Tetanus, diphtheria, and acellular pertussis (Tdap/Td) vaccine.** / 1 dose of Td every 10 years.  Zoster vaccine.** / 1 dose for adults aged 53 years or older.  Pneumococcal 13-valent conjugate (PCV13) vaccine.** / Consult your health  care provider.  Pneumococcal polysaccharide (PPSV23) vaccine.** / 1 dose for all adults aged 27 years and older. Screening for abdominal aortic aneurysm (AAA)  by ultrasound is recommended for people who have history of high blood pressure or who are current or former smokers.

## 2014-12-05 NOTE — Progress Notes (Signed)
Medicare wellness visit and follow up.   Assessment:   1. Essential hypertension - continue medications, DASH diet, exercise and monitor at home. Call if greater than 130/80.  - CBC with Differential/Platelet - Hepatic function panel - TSH - Urinalysis, Routine w reflex microscopic (not at [facility]) - Microalbumin / creatinine urine ratio - EKG 12-Lead  2. OSA on CPAP Sleep apnea- continue CPAP, weight loss advised.   3. Pulmonary emphysema, unspecified emphysema type Follow up pulmonary, continue inhaler.   4. Prediabetes Discussed general issues about diabetes pathophysiology and management., Educational material distributed., Suggested low cholesterol diet., Encouraged aerobic exercise., Discussed foot care., Reminded to get yearly retinal exam. - Hemoglobin A1c - Insulin, fasting  5. CKD (chronic kidney disease) stage 2, GFR 60-89 ml/min Increase fluids, avoid NSAIDS, monitor sugars, will monitor - BASIC METABOLIC PANEL WITH GFR  6. Hyperlipidemia -continue medications, check lipids, decrease fatty foods, increase activity.  - Lipid panel  7. Vitamin D deficiency - Vit D  25 hydroxy (rtn osteoporosis monitoring)  8. Medication management - Magnesium  9. Hypomagnesemia  10. Vitamin B12 deficiency Continue supplement  11. Gastroesophageal reflux disease with esophagitis Continue PPI/H2 blocker, diet discussed  12. Diverticulosis of intestine without bleeding, unspecified intestinal tract location Increase fiber  13. Neuropathy Continue medications, discussed feet checks  14. RLS (restless legs syndrome) Exercise  15. Hx of adenomatous colonic polyps Up to date  46. Anxiety remission  17. Urinary frequency - Urine culture  18. Need for prophylactic vaccination and inoculation against influenza - Flu vaccine HIGH DOSE PF  19. Osteoporosis Dicussed treatment, she is willing to start something but due to dysphagia does not want fosamax.  - DG Bone  Density; Future    Plan:   During the course of the visit the patient was educated and counseled about appropriate screening and preventive services including:    Pneumococcal vaccine   Influenza vaccine  Td vaccine  Screening electrocardiogram  Screening mammography  Bone densitometry screening  Colorectal cancer screening  Diabetes screening  Glaucoma screening  Nutrition counseling    Conditions/risks identified: BMI: Discussed weight loss, diet, and increase physical activity.  Increase physical activity: AHA recommends 150 minutes of physical activity a week.  Medications reviewed DEXA- requested Diabetes at goal, ACE/ARB therapy Yes. Urinary Incontinence is not an issue: discussed non pharmacology and pharmacology options.  Fall risk: low- discussed PT, home fall assessment, medications.  Subjective:   Renee Hawkins is a 74 y.o. female who presents for Medicare Annual Wellness Visit and complete physical.    Date of last medicare wellness visit  Was 07/2013.   Her blood pressure has been controlled at home, today their BP is BP: 122/70 mmHg She does workout, still at [employer].  She denies chest pain, shortness of breath, dizziness.  She is on cholesterol medication, lipitor 80mg  1/2 pill 3 days a week and denies myalgias. Her cholesterol is at goal. The cholesterol last visit was:   Lab Results  Component Value Date   CHOL 177 06/27/2014   HDL 39* 06/27/2014   LDLCALC 88 06/27/2014   TRIG 248* 06/27/2014   CHOLHDL 4.5 06/27/2014   Lab Results  Component Value Date   GFRNONAA 72 06/27/2014   She has been working on diet and exercise for prediabetes, and denies nausea, paresthesia of the feet and polydipsia. Last A1C in the office was:  Lab Results  Component Value Date   HGBA1C 5.7* 06/27/2014   Patient is on Vitamin D  supplement.   Lab Results  Component Value Date   VD25OH 62 03/06/2014   She rarely takes the lorazepam for sleep.   She is on potassium twice a day or 65meq total.  She is on steile 2puffs a day but does not take QVAR. She had a positive sleep study, now on CPAP for 5-6 weeks and states it is helping with energy and she is not on O2 at this time.  Follows with Dr. Welford Roche. She is off prilosec and she is on zantac, she will take with prilosec.   Names of Other Physician/Practitioners you currently use: 1. [facility]- here for primary care 2. [place] [facility] doctor, last visit Yearly 3. dentist, last visit as needed Patient Care Team: Unk Pinto, MD as PCP - General (Internal Medicine) Lafayette Dragon, MD as Consulting Physician (Gastroenterology) Loletha Carrow, MD (Pulmonary Disease) Calvert Cantor, MD as Consulting Physician (Ophthalmology)   Medication Review Current Outpatient Prescriptions on File Prior to Visit  Medication Sig Dispense Refill  . atorvastatin (LIPITOR) 80 MG tablet TAKE ONE (1) TABLET EACH DAY FOR CHOLESTEROL 30 tablet 0  . Cholecalciferol 4000 UNITS CAPS Take 1 capsule by mouth daily.    . Ferrous Sulfate Dried (SLOW RELEASE IRON) 45 MG TBCR Take by mouth daily.    Marland Kitchen gabapentin (NEURONTIN) 100 MG capsule TAKE ONE TO TWO CAPSULES BY MOUTH 4 TIMES DAILY AS NEEDED FOR NERVE PAIN 240 capsule 0  . LORazepam (ATIVAN) 2 MG tablet Take 1 tablet (2 mg total) by mouth at bedtime as needed for anxiety. 90 tablet 0  . Magnesium 300 MG CAPS Take 1 capsule by mouth 3 (three) times daily.     Marland Kitchen omeprazole (PRILOSEC) 40 MG capsule Take 40 mg by mouth daily.    . potassium chloride (MICRO-K) 10 MEQ CR capsule TAKE TWO CAPSULES BY MOUTH ONCE DAILY 180 capsule 0  . tiotropium (SPIRIVA) 18 MCG inhalation capsule Place 18 mcg into inhaler and inhale daily.    . verapamil (CALAN-SR) 240 MG CR tablet TAKE ONE (1) TABLET EACH DAY AFTER A MEAL FOR BLOOD PRESSURE. 90 tablet 1   No current facility-administered medications on file prior to visit.    Current Problems  (verified) Patient Active Problem List   Diagnosis Date Noted  . Benign neoplasm of sigmoid colon 05/18/2014  . Encounter for colonoscopy due to history of adenomatous colonic polyps   . Medication management 03/06/2014  . CKD (chronic kidney disease) stage 2, GFR 60-89 ml/min 11/27/2013  . COPD (chronic obstructive pulmonary disease) with emphysema 11/27/2013  . Hypertension   . Diverticulosis   . Neuropathy   . GERD (gastroesophageal reflux disease)   . Hypomagnesemia   . Hx of adenomatous colonic polyps   . Hyperlipidemia   . Prediabetes   . Vitamin D deficiency   . RLS (restless legs syndrome)   . Vitamin B12 deficiency   . Anxiety     Screening Tests Health Maintenance  Topic Date Due  . INFLUENZA VACCINE  11/05/2014  . MAMMOGRAM  02/01/2016  . TETANUS/TDAP  08/14/2018  . COLONOSCOPY  05/18/2024  . DEXA SCAN  Completed  . ZOSTAVAX  Completed  . PNA vac Low Risk Adult  Completed    Immunization History  Administered Date(s) Administered  . Influenza Split 01/18/2013  . Pneumococcal Conjugate-13 11/27/2013  . Pneumococcal Polysaccharide-23 11/19/2011  . Td 08/13/2008  . Zoster 04/06/2010    Preventative care: Last colonoscopy: 05/2014 Last mammogram: 01/2014  Last pap smear/pelvic exam: Dr. Irven Baltimore  DEXA:01/2013 due 2016  Prior vaccinations: UPTODATE SEE LIST, getting flu shot today  Allergies Allergies  Allergen Reactions  . Atenolol   . Clonazepam   . Levaquin [Levofloxacin In D5w]   . Vasotec [Enalapril]    Surgical history Past Surgical History  Procedure Laterality Date  . Appendectomy    . Tonsillectomy    . Colonoscopy N/A 05/18/2014    Procedure: COLONOSCOPY;  Surgeon: Lafayette Dragon, MD;  Location: [facility];  Service: Endoscopy;  Laterality: N/A;   Family history Family History  Problem Relation Age of Onset  . Colon cancer Neg Hx   . Stroke Mother   . Stroke Maternal Grandmother     Risk  Factors: Osteoporosis: postmenopausal estrogen deficiency and dietary calcium and/or vitamin D deficiency History of fracture in the past year: no  Tobacco Social History  Substance Use Topics  . Smoking status: Former Smoker    Quit date: 04/06/2012  . Smokeless tobacco: Never Used  . Alcohol Use: No   MEDICARE WELLNESS OBJECTIVES: Tobacco use: She does not smoke.  Patient is a former smoker. If yes, counseling given Alcohol Current alcohol use: none Osteoporosis: postmenopausal estrogen deficiency and dietary calcium and/or vitamin D deficiency, History of fracture in the past year: no Fall risk: Low Risk Hearing: normal Visual acuity: normal,  does perform annual eye exam Diet: in general, a "healthy" diet   Physical activity: Current Exercise Habits:: Home exercise routine, Type of exercise: strength training/weights;walking, Time (Minutes): 30, Frequency (Times/Week): 6, Weekly Exercise (Minutes/Week): 180, Intensity: Moderate Cardiac risk factors: Cardiac Risk Factors include: advanced age (>62men, >17 women);dyslipidemia;hypertension Depression/mood screen:   Depression screen [facility] 2/9 12/05/2014  Decreased Interest 0  Down, Depressed, Hopeless 0  PHQ - 2 Score 0    ADLs:  In your present state of health, do you have any difficulty performing the following activities: 12/05/2014  Hearing? N  Vision? N  Difficulty concentrating or making decisions? N  Walking or climbing stairs? N  Dressing or bathing? N  Doing errands, shopping? N  Preparing Food and eating ? N  Using the Toilet? N  In the past six months, have you accidently leaked urine? N  Do you have problems with loss of bowel control? N  Managing your Medications? N  Managing your Finances? N  Housekeeping or managing your Housekeeping? N    Cognitive Testing  Alert? Yes  Normal Appearance?Yes  Oriented to person? Yes  Place? Yes   Time? Yes  Recall of three objects?  Yes  Can perform simple calculations?  Yes  Displays appropriate judgment?Yes  Can read the correct time from a watch face?Yes  EOL planning: Does patient have an advance directive?: Yes Type of Advance Directive: Healthcare Power of Attorney, Living will Does patient want to make changes to advanced directive?: No - Patient declined Copy of advanced directive(s) in chart?: No - copy requested  Review of Systems  Constitutional: Negative.   HENT: Negative.        + Snoring, better on CPAP  Respiratory: Positive for shortness of breath (improved with CPAP, not using O2 anymore). Negative for cough, hemoptysis, sputum production and wheezing.   Cardiovascular: Negative.   Gastrointestinal: Negative for heartburn, nausea, vomiting, abdominal pain, diarrhea, constipation, blood in stool and melena.  Genitourinary: Positive for frequency. Negative for dysuria, urgency, hematuria and flank pain.  Musculoskeletal: Negative.  Negative for falls.  Skin: Negative.   Neurological: Positive for sensory change (  bilateral legs). Negative for dizziness, tingling, tremors, speech change, focal weakness, seizures and loss of consciousness.  Psychiatric/Behavioral: Negative.  Negative for depression.    Objective:     Blood pressure 122/70, pulse 80, temperature 97.3 F (36.3 C), resp. rate 16, height 5\' 3"  (1.6 m), weight 123 lb 3.2 oz (55.883 kg). Body mass index is 21.83 kg/(m^2).  General appearance: alert, no distress, WD/WN,  female HEENT: normocephalic, sclerae anicteric, TMs pearly, nares patent, no discharge or erythema, pharynx normal Oral cavity: MMM, no lesions Neck: supple, no lymphadenopathy, no thyromegaly, no masses Heart: RRR, normal S1, S2, no murmurs Lungs: CTA bilaterally, no wheezes, rhonchi, or rales Abdomen: +bs, soft, diffuse tenderness, no rebound, distended, no masses, no hepatomegaly, no splenomegaly Musculoskeletal: nontender, no swelling, no obvious deformity Extremities: no edema, no cyanosis, no  clubbing Pulses: 2+ symmetric, upper and lower extremities, normal cap refill Neurological: alert, oriented x 3, CN2-12 intact, strength normal upper extremities and lower extremities, sensation normal throughout, DTRs 2+ throughout, no cerebellar signs, gait normal Psychiatric: normal affect, behavior normal, pleasant  Breast: defer Gyn: defer  Rectal:   Medicare Attestation I have personally reviewed: The patient's medical and social history Their use of alcohol, tobacco or illicit drugs Their current medications and supplements The patient's functional ability including ADLs,fall risks, home safety risks, cognitive, and hearing and visual impairment Diet and physical activities Evidence for depression or mood disorders  The patient's weight, height, BMI, and visual acuity have been recorded in the chart.  I have made referrals, counseling, and provided education to the patient based on review of the above and I have provided the patient with a written personalized care plan for preventive services.     Vicie Mutters, PA-C   12/05/2014

## 2014-12-06 LAB — BASIC METABOLIC PANEL WITH GFR
BUN: 12 mg/dL (ref 7–25)
CHLORIDE: 102 mmol/L (ref 98–110)
CO2: 29 mmol/L (ref 20–31)
CREATININE: 0.88 mg/dL (ref 0.60–0.93)
Calcium: 9.2 mg/dL (ref 8.6–10.4)
GFR, EST AFRICAN AMERICAN: 76 mL/min (ref >=60)
GFR, Est Non African American: 66 mL/min (ref >=60)
GLUCOSE: 75 mg/dL (ref 65–99)
Potassium: 4.4 mmol/L (ref 3.5–5.3)
Sodium: 140 mmol/L (ref 135–146)

## 2014-12-06 LAB — LIPID PANEL
Cholesterol: 153 mg/dL (ref 125–200)
HDL: 38 mg/dL — AB (ref >=46)
LDL Cholesterol: 84 mg/dL (ref <130)
Total CHOL/HDL Ratio: 4 Ratio (ref <=5.0)
Triglycerides: 156 mg/dL — ABNORMAL HIGH (ref <150)
VLDL: 31 mg/dL — ABNORMAL HIGH (ref <30)

## 2014-12-06 LAB — HEMOGLOBIN A1C
Hgb A1c MFr Bld: 5.6 % (ref <5.7)
Mean Plasma Glucose: 114 mg/dL (ref <117)

## 2014-12-06 LAB — URINALYSIS, ROUTINE W REFLEX MICROSCOPIC
BILIRUBIN URINE: NEGATIVE
GLUCOSE, UA: NEGATIVE
KETONES UR: NEGATIVE
LEUKOCYTES UA: NEGATIVE
Nitrite: NEGATIVE
PROTEIN: NEGATIVE
Specific Gravity, Urine: 1.009 (ref 1.001–1.035)
pH: 8 (ref 5.0–8.0)

## 2014-12-06 LAB — URINE CULTURE
Colony Count: NO GROWTH
Organism ID, Bacteria: NO GROWTH

## 2014-12-06 LAB — URINALYSIS, MICROSCOPIC ONLY
Casts: NONE SEEN [LPF]
Crystals: NONE SEEN [HPF]
RBC / HPF: NONE SEEN RBC/HPF (ref <=2)
SQUAMOUS EPITHELIAL / LPF: NONE SEEN [HPF] (ref <=5)
WBC, UA: NONE SEEN WBC/HPF (ref <=5)
Yeast: NONE SEEN [HPF]

## 2014-12-06 LAB — HEPATIC FUNCTION PANEL
ALBUMIN: 4.1 g/dL (ref 3.6–5.1)
ALT: 19 U/L (ref 6–29)
AST: 19 U/L (ref 10–35)
Alkaline Phosphatase: 102 U/L (ref 33–130)
BILIRUBIN TOTAL: 0.5 mg/dL (ref 0.2–1.2)
Bilirubin, Direct: 0.1 mg/dL (ref <=0.2)
Indirect Bilirubin: 0.4 mg/dL (ref 0.2–1.2)
Total Protein: 6.6 g/dL (ref 6.1–8.1)

## 2014-12-06 LAB — MICROALBUMIN / CREATININE URINE RATIO: CREATININE, URINE: 37.2 mg/dL

## 2014-12-06 LAB — VITAMIN D 25 HYDROXY (VIT D DEFICIENCY, FRACTURES): VIT D 25 HYDROXY: 54 ng/mL (ref 30–100)

## 2014-12-06 LAB — INSULIN, FASTING: Insulin fasting, serum: 10.2 u[IU]/mL (ref 2.0–19.6)

## 2014-12-06 LAB — TSH: TSH: 1.96 u[IU]/mL (ref 0.350–4.500)

## 2014-12-06 LAB — MAGNESIUM: Magnesium: 1.9 mg/dL (ref 1.5–2.5)

## 2014-12-20 ENCOUNTER — Other Ambulatory Visit: Payer: Self-pay | Admitting: Physician Assistant

## 2014-12-20 DIAGNOSIS — E2839 Other primary ovarian failure: Secondary | ICD-10-CM

## 2014-12-20 DIAGNOSIS — Z1231 Encounter for screening mammogram for malignant neoplasm of breast: Secondary | ICD-10-CM

## 2015-01-21 ENCOUNTER — Other Ambulatory Visit: Payer: Self-pay | Admitting: Internal Medicine

## 2015-01-28 DIAGNOSIS — M9904 Segmental and somatic dysfunction of sacral region: Secondary | ICD-10-CM | POA: Diagnosis not present

## 2015-01-28 DIAGNOSIS — M5136 Other intervertebral disc degeneration, lumbar region: Secondary | ICD-10-CM | POA: Diagnosis not present

## 2015-01-31 ENCOUNTER — Other Ambulatory Visit: Payer: Self-pay | Admitting: Internal Medicine

## 2015-02-05 ENCOUNTER — Ambulatory Visit
Admission: RE | Admit: 2015-02-05 | Discharge: 2015-02-05 | Disposition: A | Discharge status: Home / Self Care | Payer: Medicare Other | Source: Ambulatory Visit | Attending: Physician Assistant | Admitting: Physician Assistant

## 2015-02-05 DIAGNOSIS — M810 Age-related osteoporosis without current pathological fracture: Secondary | ICD-10-CM | POA: Diagnosis not present

## 2015-02-05 DIAGNOSIS — E2839 Other primary ovarian failure: Secondary | ICD-10-CM

## 2015-02-05 DIAGNOSIS — Z1231 Encounter for screening mammogram for malignant neoplasm of breast: Secondary | ICD-10-CM

## 2015-02-07 ENCOUNTER — Other Ambulatory Visit: Payer: Self-pay | Admitting: Physician Assistant

## 2015-02-13 ENCOUNTER — Other Ambulatory Visit: Payer: Self-pay | Admitting: Physician Assistant

## 2015-02-19 NOTE — Addendum Note (Signed)
Addended by: Vicie Mutters R on: 02/19/2015 10:54 AM   Modules accepted: Miquel Dunn

## 2015-03-13 ENCOUNTER — Other Ambulatory Visit: Payer: Self-pay | Admitting: Internal Medicine

## 2015-03-28 ENCOUNTER — Encounter: Payer: Self-pay | Admitting: Internal Medicine

## 2015-03-28 ENCOUNTER — Ambulatory Visit (INDEPENDENT_AMBULATORY_CARE_PROVIDER_SITE_OTHER): Payer: Medicare Other | Admitting: Internal Medicine

## 2015-03-28 VITALS — BP 122/76 | HR 88 | Temp 97.6°F | Resp 16 | Ht 63.0 in | Wt 125.2 lb

## 2015-03-28 DIAGNOSIS — R7303 Prediabetes: Secondary | ICD-10-CM

## 2015-03-28 DIAGNOSIS — E559 Vitamin D deficiency, unspecified: Secondary | ICD-10-CM | POA: Diagnosis not present

## 2015-03-28 DIAGNOSIS — R7309 Other abnormal glucose: Secondary | ICD-10-CM

## 2015-03-28 DIAGNOSIS — Z79899 Other long term (current) drug therapy: Secondary | ICD-10-CM | POA: Diagnosis not present

## 2015-03-28 DIAGNOSIS — I10 Essential (primary) hypertension: Secondary | ICD-10-CM | POA: Diagnosis not present

## 2015-03-28 DIAGNOSIS — E785 Hyperlipidemia, unspecified: Secondary | ICD-10-CM | POA: Diagnosis not present

## 2015-03-28 DIAGNOSIS — J439 Emphysema, unspecified: Secondary | ICD-10-CM

## 2015-03-28 DIAGNOSIS — G4733 Obstructive sleep apnea (adult) (pediatric): Secondary | ICD-10-CM | POA: Diagnosis not present

## 2015-03-28 DIAGNOSIS — Z9989 Dependence on other enabling machines and devices: Secondary | ICD-10-CM

## 2015-03-28 DIAGNOSIS — M810 Age-related osteoporosis without current pathological fracture: Secondary | ICD-10-CM | POA: Diagnosis not present

## 2015-03-28 LAB — CBC WITH DIFFERENTIAL/PLATELET
BASOS ABS: 0 10*3/uL (ref 0.0–0.1)
BASOS PCT: 0 % (ref 0–1)
EOS PCT: 2 % (ref 0–5)
Eosinophils Absolute: 0.1 10*3/uL (ref 0.0–0.7)
HEMATOCRIT: 36.5 % (ref 36.0–46.0)
Hemoglobin: 12.3 g/dL (ref 12.0–15.0)
LYMPHS PCT: 32 % (ref 12–46)
Lymphs Abs: 2.1 10*3/uL (ref 0.7–4.0)
MCH: 31.1 pg (ref 26.0–34.0)
MCHC: 33.7 g/dL (ref 30.0–36.0)
MCV: 92.2 fL (ref 78.0–100.0)
MPV: 10.7 fL (ref 8.6–12.4)
Monocytes Absolute: 0.4 10*3/uL (ref 0.1–1.0)
Monocytes Relative: 6 % (ref 3–12)
Neutro Abs: 3.9 10*3/uL (ref 1.7–7.7)
Neutrophils Relative %: 60 % (ref 43–77)
PLATELETS: 195 10*3/uL (ref 150–400)
RBC: 3.96 MIL/uL (ref 3.87–5.11)
RDW: 14 % (ref 11.5–15.5)
WBC: 6.5 10*3/uL (ref 4.0–10.5)

## 2015-03-28 LAB — BASIC METABOLIC PANEL WITH GFR
BUN: 17 mg/dL (ref 7–25)
CO2: 31 mmol/L (ref 20–31)
Calcium: 9.6 mg/dL (ref 8.6–10.4)
Chloride: 102 mmol/L (ref 98–110)
Creat: 1.01 mg/dL — ABNORMAL HIGH (ref 0.60–0.93)
GFR, EST AFRICAN AMERICAN: 64 mL/min (ref >=60)
GFR, EST NON AFRICAN AMERICAN: 56 mL/min — AB (ref >=60)
GLUCOSE: 78 mg/dL (ref 65–99)
POTASSIUM: 4.3 mmol/L (ref 3.5–5.3)
SODIUM: 142 mmol/L (ref 135–146)

## 2015-03-28 LAB — LIPID PANEL
CHOL/HDL RATIO: 3.5 ratio (ref <=5.0)
CHOLESTEROL: 141 mg/dL (ref 125–200)
HDL: 40 mg/dL — AB (ref >=46)
LDL CALC: 64 mg/dL (ref <130)
TRIGLYCERIDES: 187 mg/dL — AB (ref <150)
VLDL: 37 mg/dL — AB (ref <30)

## 2015-03-28 LAB — HEPATIC FUNCTION PANEL
ALK PHOS: 115 U/L (ref 33–130)
ALT: 17 U/L (ref 6–29)
AST: 18 U/L (ref 10–35)
Albumin: 4.2 g/dL (ref 3.6–5.1)
BILIRUBIN DIRECT: 0.1 mg/dL (ref <=0.2)
BILIRUBIN INDIRECT: 0.3 mg/dL (ref 0.2–1.2)
BILIRUBIN TOTAL: 0.4 mg/dL (ref 0.2–1.2)
Total Protein: 6.9 g/dL (ref 6.1–8.1)

## 2015-03-28 LAB — MAGNESIUM: Magnesium: 2 mg/dL (ref 1.5–2.5)

## 2015-03-28 MED ORDER — IBANDRONATE SODIUM 150 MG PO TABS: 150.0000 mg | ORAL_TABLET | ORAL | Status: DC

## 2015-03-28 NOTE — Patient Instructions (Signed)

## 2015-03-29 ENCOUNTER — Encounter: Payer: Self-pay | Admitting: Internal Medicine

## 2015-03-29 LAB — INSULIN, RANDOM: Insulin: 8.8 u[IU]/mL (ref 2.0–19.6)

## 2015-03-29 LAB — TSH: TSH: 1.805 u[IU]/mL (ref 0.350–4.500)

## 2015-03-29 LAB — HEMOGLOBIN A1C
Hgb A1c MFr Bld: 5.6 % (ref <5.7)
Mean Plasma Glucose: 114 mg/dL (ref <117)

## 2015-03-29 LAB — VITAMIN D 25 HYDROXY (VIT D DEFICIENCY, FRACTURES): Vit D, 25-Hydroxy: 54 ng/mL (ref 30–100)

## 2015-03-29 NOTE — Progress Notes (Signed)
Patient ID: Renee Hawkins, female   DOB: [birth year], 74 y.o.   MRN: [id number]     This very nice 74 y.o. [facility] presents for 3 month follow up with Hypertension, COPD, Hyperlipidemia, Pre-Diabetes and Vitamin D Deficiency. Patient has  Vit D 75 - Excellent. Patient also has COPD and reports smoking cessation about 1 year ago! She admits DOE and denies any sputum pdn. Patient also related that she was dx'd with OSA by Dr Ardeen Garland  In [facility] and is on CPAP. Patient also was recently dx'd with Osteoporosis and is started in [place].     Patient is treated for HTN since 1985. & BP has been controlled at home. Today's BP: 126/72 mmHg. Patient has had no complaints of any cardiac type chest pain, palpitations, dyspnea/orthopnea/PND, dizziness, claudication, or dependent edema. Patient does have hx/o sensitivity to diuretics wit refractory hypokalemia and hypomagnesia.      Hyperlipidemia is controlled with diet & meds. Patient denies myalgias or other med SE's. Last Lipids were at goal -  Total Chol  174; HDL 48; LDL 89; Trig 187 on 11/27/2013.     Also, the patient is screened for PreDiabetes and has had no symptoms of reactive hypoglycemia, diabetic polys, paresthesias or visual blurring.  Last A1c was 5.6 % on  11/27/2013.  Allergies  Allergen Reactions  . Atenolol   . Clonazepam   . Levaquin [Levofloxacin In D5w]   . Vasotec [Enalapril]    PMHx:   Past Medical History  Diagnosis Date  . Hypomagnesemia   . Neuropathy ([place])   . Diverticulosis   . Hx of adenomatous colonic polyps   . Hypertension   . GERD (gastroesophageal reflux disease)   . Hyperlipidemia   . Prediabetes   . Vitamin D deficiency   . RLS (restless legs syndrome)   . Vitamin B12 deficiency   . Anxiety   . COPD (chronic obstructive pulmonary disease) ([place])    Immunization History  Administered Date(s) Administered  . Influenza Split 01/18/2013  . Influenza, High Dose Seasonal PF 12/05/2014  . Pneumococcal Conjugate-13  11/27/2013  . Pneumococcal Polysaccharide-23 11/19/2011  . Td 08/13/2008  . Zoster 04/06/2010   Past Surgical History  Procedure Laterality Date  . Appendectomy    . Tonsillectomy    . Colonoscopy N/A 05/18/2014    Procedure: COLONOSCOPY;  Surgeon: Lafayette Dragon, MD;  Location: [facility];  Service: Endoscopy;  Laterality: N/A;   FHx:    Reviewed / unchanged  SHx:    Reviewed / unchanged  Systems Review:  Constitutional: Denies fever, chills, wt changes, headaches, insomnia, fatigue, night sweats, change in appetite. Eyes: Denies redness, blurred vision, diplopia, discharge, itchy, watery eyes.  ENT: Denies discharge, congestion, post nasal drip, epistaxis, sore throat, earache, hearing loss, dental pain, tinnitus, vertigo, sinus pain, snoring.  CV: Denies chest pain, palpitations, irregular heartbeat, syncope, dyspnea, diaphoresis, orthopnea, PND, claudication or edema. Respiratory: denies cough, dyspnea, DOE, pleurisy, hoarseness, laryngitis, wheezing.  Gastrointestinal: Denies dysphagia, odynophagia, heartburn, reflux, water brash, abdominal pain or cramps, nausea, vomiting, bloating, diarrhea, constipation, hematemesis, melena, hematochezia  or hemorrhoids. Genitourinary: Denies dysuria, frequency, urgency, nocturia, hesitancy, discharge, hematuria or flank pain. Musculoskeletal: Denies arthralgias, myalgias, stiffness, jt. swelling, pain, limping or strain/sprain.  Skin: Denies pruritus, rash, hives, warts, acne, eczema or change in skin lesion(s). Neuro: No weakness, tremor, incoordination, spasms, paresthesia or pain. Psychiatric: Denies confusion, memory loss or sensory loss. Endo: Denies change in weight, skin or hair change.  Heme/Lymph: No excessive  bleeding, bruising or enlarged lymph nodes.  Physical Exam  BP 122/76 mmHg  Pulse 88  Temp(Src) 97.6 F (36.4 C)  Resp 16  Ht 5\' 3"  (1.6 m)  Wt 125 lb 3.2 oz (56.79 kg)  BMI 22.18 kg/m2  Appears well nourished and in  no distress. Eyes: PERRLA, EOMs, conjunctiva no swelling or erythema. Sinuses: No frontal/maxillary tenderness ENT/Mouth: EAC's clear, TM's nl w/o erythema, bulging. Nares clear w/o erythema, swelling, exudates. Oropharynx clear without erythema or exudates. Oral hygiene is good. Tongue normal, non obstructing. Hearing intact.  Neck: Supple. Thyroid nl. Car 2+/2+ without bruits, nodes or JVD. Chest: Respirations nl with BS clear & equal w/o rales, rhonchi, wheezing or stridor.  Cor: Heart sounds normal w/ regular rate and rhythm without sig. murmurs, gallops, clicks, or rubs. Peripheral pulses normal and equal  without edema.  Abdomen: Soft & bowel sounds normal. Non-tender w/o guarding, rebound, hernias, masses, or organomegaly.  Lymphatics: Unremarkable.  Musculoskeletal: Full ROM all peripheral extremities, joint stability, 5/5 strength, and normal gait.  Skin: Warm, dry without exposed rashes, lesions or ecchymosis apparent.  Neuro: Cranial nerves intact, reflexes equal bilaterally. Sensory-motor testing grossly intact. Tendon reflexes grossly intact.  Pysch: Alert & oriented x 3.  Insight and judgement nl & appropriate. No ideations.  Assessment and Plan:  1. Essential hypertension  - TSH  2. Hyperlipidemia  - Lipid panel - TSH  3. Prediabetes  - Hemoglobin A1c - Insulin, random  4. Vitamin D deficiency  - VITAMIN D 25 Hydroxy   5. Pulmonary emphysema ([place])   6. OSA on CPAP   7. Other abnormal glucose  - Hemoglobin A1c - Insulin, random  8. Osteoporosis  - ibandronate (BONIVA) 150 MG tablet; Take 1 tablet (150 mg total) by mouth every 30 (thirty) days. Take in the morning with a full glass of water, on an empty stomach, and do not take anything else by mouth or lie down for the next 30 min.  Dispense: 3 tablet; Refill: 3  9. Medication management  - CBC with Differential/Platelet - BASIC METABOLIC PANEL WITH GFR - Hepatic function panel -  Magnesium   Recommended regular exercise, BP monitoring, weight control, and discussed med and SE's. Recommended labs to assess and monitor clinical status. Further disposition pending results of labs. Over 30 minutes of exam, counseling, chart review was performed

## 2015-04-03 ENCOUNTER — Other Ambulatory Visit: Payer: Self-pay | Admitting: Internal Medicine

## 2015-04-07 HISTORY — PX: CATARACT EXTRACTION, BILATERAL: SHX1313

## 2015-04-19 ENCOUNTER — Other Ambulatory Visit: Payer: Self-pay | Admitting: Internal Medicine

## 2015-05-18 ENCOUNTER — Other Ambulatory Visit: Payer: Self-pay | Admitting: Physician Assistant

## 2015-05-18 ENCOUNTER — Other Ambulatory Visit: Payer: Self-pay | Admitting: Internal Medicine

## 2015-05-20 ENCOUNTER — Other Ambulatory Visit: Payer: Self-pay | Admitting: Internal Medicine

## 2015-05-27 DIAGNOSIS — G4733 Obstructive sleep apnea (adult) (pediatric): Secondary | ICD-10-CM | POA: Diagnosis not present

## 2015-05-27 DIAGNOSIS — J449 Chronic obstructive pulmonary disease, unspecified: Secondary | ICD-10-CM | POA: Diagnosis not present

## 2015-05-27 DIAGNOSIS — G4734 Idiopathic sleep related nonobstructive alveolar hypoventilation: Secondary | ICD-10-CM | POA: Diagnosis not present

## 2015-06-12 DIAGNOSIS — M5136 Other intervertebral disc degeneration, lumbar region: Secondary | ICD-10-CM | POA: Diagnosis not present

## 2015-06-12 DIAGNOSIS — M9904 Segmental and somatic dysfunction of sacral region: Secondary | ICD-10-CM | POA: Diagnosis not present

## 2015-06-19 ENCOUNTER — Ambulatory Visit (INDEPENDENT_AMBULATORY_CARE_PROVIDER_SITE_OTHER): Payer: Medicare Other | Admitting: Internal Medicine

## 2015-06-19 ENCOUNTER — Encounter: Payer: Self-pay | Admitting: Internal Medicine

## 2015-06-19 VITALS — BP 140/64 | HR 78 | Temp 97.8°F | Resp 18 | Ht 63.0 in | Wt 125.0 lb

## 2015-06-19 DIAGNOSIS — R0981 Nasal congestion: Secondary | ICD-10-CM

## 2015-06-19 MED ORDER — FLUTICASONE PROPIONATE 50 MCG/ACT NA SUSP: 2.0000 | Freq: Every day | NASAL | Status: DC

## 2015-06-19 MED ORDER — CHLORPHEN-PE-ACETAMINOPHEN 4-10-325 MG PO TABS: 1.0000 | ORAL_TABLET | Freq: Four times a day (QID) | ORAL | Status: DC | PRN

## 2015-06-19 NOTE — Progress Notes (Signed)
HPI  Patient presents to the office for evaluation of nasal congestion and runny nose.  It has been going on for 1 days.  Patient reports minimal cough.  They also endorse change in voice, chills, postnasal drip and nasal congestion, clear rhinorrhea..  They have tried zicam.  They report that nothing has worked.  They denies other sick contacts.  Review of Systems  Constitutional: Negative for fever, chills and malaise/fatigue.  HENT: Positive for congestion. Negative for ear pain and sore throat.   Respiratory: Negative for cough, shortness of breath and wheezing.   Cardiovascular: Negative for chest pain, palpitations and leg swelling.  Skin: Negative.   Neurological: Negative for headaches.    PE:  Filed Vitals:   06/19/15 1419  BP: 140/64  Pulse: 78  Temp: 97.8 F (36.6 C)  Resp: 18   General:  Alert and non-toxic, WDWN, NAD HEENT: NCAT, PERLA, EOM normal, no occular discharge or erythema.  Nasal mucosal edema with sinus tenderness to palpation.  Oropharynx clear with minimal oropharyngeal edema and erythema.  Mucous membranes moist and pink. Neck:  Cervical adenopathy Chest:  RRR no MRGs.  Lungs clear to auscultation A&P with no wheezes rhonchi or rales.   Abdomen: +BS x 4 quadrants, soft, non-tender, no guarding, rigidity, or rebound. Skin: warm and dry no rash Neuro: A&Ox4, CN II-XII grossly intact  Assessment and Plan:   1. Nasal congestion  - fluticasone (FLONASE) 50 MCG/ACT nasal spray; Place 2 sprays into both nostrils daily.  Dispense: 16 g; Refill: 0 - Chlorphen-PE-Acetaminophen 4-10-325 MG TABS; Take 1 tablet by mouth every 6 (six) hours as needed.  Dispense: 84 tablet; Refill: 0 -saline

## 2015-07-02 ENCOUNTER — Ambulatory Visit: Payer: Self-pay | Admitting: Internal Medicine

## 2015-07-10 DIAGNOSIS — M9904 Segmental and somatic dysfunction of sacral region: Secondary | ICD-10-CM | POA: Diagnosis not present

## 2015-07-10 DIAGNOSIS — M5136 Other intervertebral disc degeneration, lumbar region: Secondary | ICD-10-CM | POA: Diagnosis not present

## 2015-07-24 ENCOUNTER — Encounter: Payer: Self-pay | Admitting: Internal Medicine

## 2015-07-24 ENCOUNTER — Ambulatory Visit (INDEPENDENT_AMBULATORY_CARE_PROVIDER_SITE_OTHER): Payer: Medicare Other | Admitting: Internal Medicine

## 2015-07-24 VITALS — BP 116/64 | HR 80 | Temp 98.2°F | Resp 16 | Ht 63.0 in | Wt 126.0 lb

## 2015-07-24 DIAGNOSIS — R0981 Nasal congestion: Secondary | ICD-10-CM | POA: Diagnosis not present

## 2015-07-24 DIAGNOSIS — Z79899 Other long term (current) drug therapy: Secondary | ICD-10-CM

## 2015-07-24 DIAGNOSIS — I10 Essential (primary) hypertension: Secondary | ICD-10-CM

## 2015-07-24 DIAGNOSIS — E785 Hyperlipidemia, unspecified: Secondary | ICD-10-CM

## 2015-07-24 DIAGNOSIS — R7309 Other abnormal glucose: Secondary | ICD-10-CM | POA: Diagnosis not present

## 2015-07-24 DIAGNOSIS — R7303 Prediabetes: Secondary | ICD-10-CM | POA: Diagnosis not present

## 2015-07-24 DIAGNOSIS — E559 Vitamin D deficiency, unspecified: Secondary | ICD-10-CM | POA: Diagnosis not present

## 2015-07-24 LAB — CBC WITH DIFFERENTIAL/PLATELET
BASOS PCT: 0 %
Basophils Absolute: 0 cells/uL (ref 0–200)
Eosinophils Absolute: 46 cells/uL (ref 15–500)
Eosinophils Relative: 1 %
HEMATOCRIT: 37.1 % (ref 35.0–45.0)
Hemoglobin: 12.4 g/dL (ref 11.7–15.5)
LYMPHS PCT: 37 %
Lymphs Abs: 1702 cells/uL (ref 850–3900)
MCH: 31.3 pg (ref 27.0–33.0)
MCHC: 33.4 g/dL (ref 32.0–36.0)
MCV: 93.7 fL (ref 80.0–100.0)
MONO ABS: 414 {cells}/uL (ref 200–950)
MONOS PCT: 9 %
MPV: 10.3 fL (ref 7.5–12.5)
Neutro Abs: 2438 cells/uL (ref 1500–7800)
Neutrophils Relative %: 53 %
PLATELETS: 189 10*3/uL (ref 140–400)
RBC: 3.96 MIL/uL (ref 3.80–5.10)
RDW: 13.9 % (ref 11.0–15.0)
WBC: 4.6 10*3/uL (ref 3.8–10.8)

## 2015-07-24 MED ORDER — FLUTICASONE PROPIONATE 50 MCG/ACT NA SUSP: 2.0000 | Freq: Every day | NASAL | Status: DC

## 2015-07-24 NOTE — Progress Notes (Signed)
Assessment and Plan:  Hypertension:  -Continue medication,  -monitor blood pressure at home.  -Continue DASH diet.   -Reminder to go to the ER if any CP, SOB, nausea, dizziness, severe HA, changes vision/speech, left arm numbness and tingling, and jaw pain.  Cholesterol: -Continue diet and exercise.  -Check cholesterol.   Pre-diabetes: -Continue diet and exercise.  -Check A1C  Vitamin D Def: -check level -continue medications.   Allergic rhinitis -continue daily antihistamine -restart flonase -nasal saline prior to using CPAP  Continue diet and meds as discussed. Further disposition pending results of labs.  HPI 75 y.o. female  presents for 3 month follow up with hypertension, hyperlipidemia, prediabetes and vitamin D.   Her blood pressure has been controlled at home, today their BP is BP: 116/64 mmHg.   She does not workout. She denies chest pain, shortness of breath, dizziness.   She is on cholesterol medication and denies myalgias. Her cholesterol is not at goal. The cholesterol last visit was:   Lab Results  Component Value Date   CHOL 141 03/28/2015   HDL 40* 03/28/2015   LDLCALC 64 03/28/2015   TRIG 187* 03/28/2015   CHOLHDL 3.5 03/28/2015     She has been working on diet and exercise for prediabetes, and denies foot ulcerations, hyperglycemia, hypoglycemia , increased appetite, nausea, paresthesia of the feet, polydipsia, polyuria, visual disturbances, vomiting and weight loss. Last A1C in the office was:  Lab Results  Component Value Date   HGBA1C 5.6 03/28/2015    Patient is on Vitamin D supplement.  Lab Results  Component Value Date   VD25OH 17 03/28/2015      She reports that she has been doing well.  She did recently get back from a cruise to the [place].  She reports that she did a lot of walking on it.    She has been using her cpap machine and has been having a lot of nasal congestion and hoarseness.  She is not using flonase right now.     Current Medications:  Current Outpatient Prescriptions on File Prior to Visit  Medication Sig Dispense Refill  . atorvastatin (LIPITOR) 80 MG tablet TAKE ONE TABLET BY MOUTH ONCE DAILY FOR CHOLESTEROL 30 tablet 3  . Cholecalciferol 4000 UNITS CAPS Take 1 capsule by mouth daily.    . Ferrous Sulfate Dried (SLOW RELEASE IRON) 45 MG TBCR Take by mouth daily.    Marland Kitchen gabapentin (NEURONTIN) 100 MG capsule TAKE ONE TO TWO CAPSULES BY MOUTH 4 TIMES DAILY AS NEEDED FOR NERVE PAIN 240 capsule 1  . ibandronate (BONIVA) 150 MG tablet Take 1 tablet (150 mg total) by mouth every 30 (thirty) days. Take in the morning with a full glass of water, on an empty stomach, and do not take anything else by mouth or lie down for the next 30 min. 3 tablet 3  . Magnesium 300 MG CAPS Take 1 capsule by mouth 3 (three) times daily.     . potassium chloride (MICRO-K) 10 MEQ CR capsule TAKE TWO CAPSULES BY MOUTH ONCE DAILY 180 capsule 1  . tiotropium (SPIRIVA) 18 MCG inhalation capsule Place 18 mcg into inhaler and inhale daily.    . verapamil (CALAN-SR) 240 MG CR tablet TAKE ONE TABLET BY MOUTH ONCE DAILY AFTER A MEAL FOR BLOOD PRESSURE 90 tablet 0   No current facility-administered medications on file prior to visit.    Medical History:  Past Medical History  Diagnosis Date  . Hypomagnesemia   . Neuropathy (  HCC)   . Diverticulosis   . Hx of adenomatous colonic polyps   . Hypertension   . GERD (gastroesophageal reflux disease)   . Hyperlipidemia   . Prediabetes   . Vitamin D deficiency   . RLS (restless legs syndrome)   . Vitamin B12 deficiency   . Anxiety   . COPD (chronic obstructive pulmonary disease) (HCC)     Allergies:  Allergies  Allergen Reactions  . Atenolol   . Clonazepam   . Levaquin [Levofloxacin In D5w]   . Vasotec [Enalapril]      Review of Systems:  Review of Systems  Constitutional: Negative for fever, chills and malaise/fatigue.  HENT: Positive for congestion. Negative for ear pain  and sore throat.   Eyes: Negative.   Respiratory: Negative for cough, shortness of breath and wheezing.   Cardiovascular: Negative for chest pain, palpitations and leg swelling.  Gastrointestinal: Negative for heartburn, abdominal pain, diarrhea, constipation, blood in stool and melena.  Genitourinary: Negative.   Skin: Negative.   Neurological: Negative for dizziness, sensory change, loss of consciousness and headaches.  Psychiatric/Behavioral: Negative for depression. The patient is not nervous/anxious and does not have insomnia.     Family history- Review and unchanged  Social history- Review and unchanged  Physical Exam: BP 116/64 mmHg  Pulse 80  Temp(Src) 98.2 F (36.8 C) (Temporal)  Resp 16  Ht 5\' 3"  (1.6 m)  Wt 126 lb (57.153 kg)  BMI 22.33 kg/m2 Wt Readings from Last 3 Encounters:  07/24/15 126 lb (57.153 kg)  06/19/15 125 lb (56.7 kg)  03/28/15 125 lb 3.2 oz (56.79 kg)    General Appearance: Well nourished well developed, in no apparent distress. Eyes: PERRLA, EOMs, conjunctiva no swelling or erythema ENT/Mouth: Ear canals normal without obstruction, swelling, erythma, discharge.  TMs normal bilaterally.  Oropharynx moist, clear, without exudate, or postoropharyngeal swelling. Neck: Supple, thyroid normal,no cervical adenopathy  Respiratory: Respiratory effort normal, Breath sounds clear A&P without rhonchi, wheeze, or rale.  No retractions, no accessory usage. Cardio: RRR with no MRGs. Brisk peripheral pulses without edema.  Abdomen: Soft, + BS,  Non tender, no guarding, rebound, hernias, masses. Musculoskeletal: Full ROM, 5/5 strength, Normal gait Skin: Warm, dry without rashes, lesions, ecchymosis.  Neuro: Awake and oriented X 3, Cranial nerves intact. Normal muscle tone, no cerebellar symptoms. Psych: Normal affect, Insight and Judgment appropriate.    Starlyn Skeans, PA-C 3:46 PM [facility]

## 2015-07-25 LAB — HEPATIC FUNCTION PANEL
ALT: 14 U/L (ref 6–29)
AST: 16 U/L (ref 10–35)
Albumin: 4.2 g/dL (ref 3.6–5.1)
Alkaline Phosphatase: 68 U/L (ref 33–130)
BILIRUBIN INDIRECT: 0.3 mg/dL (ref 0.2–1.2)
BILIRUBIN TOTAL: 0.4 mg/dL (ref 0.2–1.2)
Bilirubin, Direct: 0.1 mg/dL (ref <=0.2)
Total Protein: 7 g/dL (ref 6.1–8.1)

## 2015-07-25 LAB — BASIC METABOLIC PANEL WITH GFR
BUN: 13 mg/dL (ref 7–25)
CO2: 26 mmol/L (ref 20–31)
Calcium: 9.5 mg/dL (ref 8.6–10.4)
Chloride: 102 mmol/L (ref 98–110)
Creat: 0.99 mg/dL — ABNORMAL HIGH (ref 0.60–0.93)
GFR, EST AFRICAN AMERICAN: 65 mL/min (ref >=60)
GFR, EST NON AFRICAN AMERICAN: 57 mL/min — AB (ref >=60)
Glucose, Bld: 82 mg/dL (ref 65–99)
POTASSIUM: 4.1 mmol/L (ref 3.5–5.3)
Sodium: 140 mmol/L (ref 135–146)

## 2015-07-25 LAB — LIPID PANEL
Cholesterol: 150 mg/dL (ref 125–200)
HDL: 38 mg/dL — AB (ref >=46)
LDL CALC: 81 mg/dL (ref <130)
Total CHOL/HDL Ratio: 3.9 Ratio (ref <=5.0)
Triglycerides: 157 mg/dL — ABNORMAL HIGH (ref <150)
VLDL: 31 mg/dL — ABNORMAL HIGH (ref <30)

## 2015-07-25 LAB — TSH: TSH: 1.87 mIU/L

## 2015-07-25 LAB — HEMOGLOBIN A1C
HEMOGLOBIN A1C: 5.5 % (ref <5.7)
Mean Plasma Glucose: 111 mg/dL

## 2015-08-14 DIAGNOSIS — M9904 Segmental and somatic dysfunction of sacral region: Secondary | ICD-10-CM | POA: Diagnosis not present

## 2015-08-14 DIAGNOSIS — M5136 Other intervertebral disc degeneration, lumbar region: Secondary | ICD-10-CM | POA: Diagnosis not present

## 2015-09-05 ENCOUNTER — Other Ambulatory Visit: Payer: Self-pay | Admitting: Internal Medicine

## 2015-09-11 DIAGNOSIS — M5136 Other intervertebral disc degeneration, lumbar region: Secondary | ICD-10-CM | POA: Diagnosis not present

## 2015-09-11 DIAGNOSIS — M9904 Segmental and somatic dysfunction of sacral region: Secondary | ICD-10-CM | POA: Diagnosis not present

## 2015-10-02 DIAGNOSIS — H2513 Age-related nuclear cataract, bilateral: Secondary | ICD-10-CM | POA: Diagnosis not present

## 2015-10-02 DIAGNOSIS — H52223 Regular astigmatism, bilateral: Secondary | ICD-10-CM | POA: Diagnosis not present

## 2015-10-02 DIAGNOSIS — H02834 Dermatochalasis of left upper eyelid: Secondary | ICD-10-CM | POA: Diagnosis not present

## 2015-10-02 DIAGNOSIS — H524 Presbyopia: Secondary | ICD-10-CM | POA: Diagnosis not present

## 2015-10-02 DIAGNOSIS — H5203 Hypermetropia, bilateral: Secondary | ICD-10-CM | POA: Diagnosis not present

## 2015-10-02 DIAGNOSIS — H02831 Dermatochalasis of right upper eyelid: Secondary | ICD-10-CM | POA: Diagnosis not present

## 2015-10-02 DIAGNOSIS — H31093 Other chorioretinal scars, bilateral: Secondary | ICD-10-CM | POA: Diagnosis not present

## 2015-10-09 ENCOUNTER — Ambulatory Visit: Payer: Self-pay | Admitting: Internal Medicine

## 2015-10-21 DIAGNOSIS — F17210 Nicotine dependence, cigarettes, uncomplicated: Secondary | ICD-10-CM | POA: Diagnosis not present

## 2015-10-21 DIAGNOSIS — Z9989 Dependence on other enabling machines and devices: Secondary | ICD-10-CM | POA: Diagnosis not present

## 2015-10-21 DIAGNOSIS — J449 Chronic obstructive pulmonary disease, unspecified: Secondary | ICD-10-CM | POA: Diagnosis not present

## 2015-10-21 DIAGNOSIS — G4733 Obstructive sleep apnea (adult) (pediatric): Secondary | ICD-10-CM | POA: Diagnosis not present

## 2015-10-23 ENCOUNTER — Encounter: Payer: Self-pay | Admitting: Internal Medicine

## 2015-10-23 ENCOUNTER — Ambulatory Visit (INDEPENDENT_AMBULATORY_CARE_PROVIDER_SITE_OTHER): Payer: Medicare Other | Admitting: Internal Medicine

## 2015-10-23 VITALS — BP 126/80 | HR 80 | Temp 97.4°F | Resp 16 | Ht 63.0 in | Wt 128.4 lb

## 2015-10-23 DIAGNOSIS — E559 Vitamin D deficiency, unspecified: Secondary | ICD-10-CM

## 2015-10-23 DIAGNOSIS — I10 Essential (primary) hypertension: Secondary | ICD-10-CM | POA: Diagnosis not present

## 2015-10-23 DIAGNOSIS — K219 Gastro-esophageal reflux disease without esophagitis: Secondary | ICD-10-CM | POA: Diagnosis not present

## 2015-10-23 DIAGNOSIS — M549 Dorsalgia, unspecified: Secondary | ICD-10-CM | POA: Diagnosis not present

## 2015-10-23 DIAGNOSIS — E785 Hyperlipidemia, unspecified: Secondary | ICD-10-CM

## 2015-10-23 DIAGNOSIS — R7303 Prediabetes: Secondary | ICD-10-CM

## 2015-10-23 DIAGNOSIS — M810 Age-related osteoporosis without current pathological fracture: Secondary | ICD-10-CM

## 2015-10-23 DIAGNOSIS — J439 Emphysema, unspecified: Secondary | ICD-10-CM | POA: Diagnosis not present

## 2015-10-23 DIAGNOSIS — Z79899 Other long term (current) drug therapy: Secondary | ICD-10-CM

## 2015-10-23 MED ORDER — GABAPENTIN 300 MG PO CAPS: ORAL_CAPSULE | ORAL | Status: DC

## 2015-10-23 MED ORDER — ALENDRONATE SODIUM 70 MG PO TABS
ORAL_TABLET | ORAL | Status: DC
Start: 2015-10-23 — End: 2017-02-22

## 2015-10-23 MED ORDER — OMEPRAZOLE 40 MG PO CPDR
DELAYED_RELEASE_CAPSULE | ORAL | Status: DC
Start: 2015-10-23 — End: 2016-07-20

## 2015-10-23 NOTE — Patient Instructions (Signed)

## 2015-10-23 NOTE — Progress Notes (Signed)
Patient ID: Renee Hawkins, female   DOB: 1940/10/09, 75 y.o.   MRN: [id number]  [facility]                       Unk Pinto, M.D.        Uvaldo Bristle. Silverio Lay, P.A.-C       Starlyn Skeans, P.A.-C   [facility]                [address] [place] [id number] Telephone [phone] Telefax [phone] ______________________________________________________________________     This very nice 75 y.o. [place] presents for 6 month follow up with Hypertension, Hyperlipidemia, Pre-Diabetes and Vitamin D Deficiency. Patient also has COPD and several years ago after a prolonged PNA hospitalization she had a prolonged NH rehab stay and since then has done well.      Patient is treated for HTN circa 1985 & BP has been controlled at home. Today's BP is  126/80 mmHg. Patient has had no complaints of any cardiac type chest pain, palpitations, dyspnea/orthopnea/PND, dizziness, claudication, or dependent edema.     Hyperlipidemia is controlled with diet & meds. Patient denies myalgias or other med SE's. Last Lipids were at goal with Cholesterol 150; HDL 38*; LDL 81; Triglycerides 157 on 07/24/2015.     Also, the patient has history of PreDiabetes and has had no symptoms of reactive hypoglycemia, diabetic polys, paresthesias or visual blurring.  Last A1c was 5.5% on 07/24/2015.     Further, the patient also has history of Vitamin D Deficiency of "44" on treatment and supplements vitamin D without any suspected side-effects. Last vitamin D was 54 on 03/28/2015.   Medication Sig  . atorvastatin  80 MG tablet TAKE ONE TABLET BY MOUTH ONCE DAILY FOR CHOLESTEROL  . Cholecalciferol 4000 UNITS CAPS Take 1 capsule by mouth daily.  . Ferrous Sulfate 45 MG TBCR Take by mouth daily.  Marland Kitchen fFLONASE nasal spray Place 2 sprays into both nostrils daily.  Marland Kitchen gabapentin 100 MG capsule TAKE ONE TO TWO CAPSULES BY MOUTH 4 TIMES DAILY AS  NEEDED   . BONIVA 150 MG tablet Take 1 tablet (150 mg total) by mouth every 30 (thirty) days.   . Magnesium 300 MG CAPS Take 1 capsule by mouth 3 (three) times daily.   . potassium chloride10 MEQ  TAKE TWO CAPSULES BY MOUTH ONCE DAILY  . SPIRIVA inhalation cap Place 18 mcg into inhaler and inhale daily.  . verapamil-SR 240 MG  TAKE ONE TABLET BY MOUTH ONCE DAILY   Allergies  Allergen Reactions  . Atenolol   . Clonazepam   . Levaquin [Levofloxacin In D5w]   . Vasotec [Enalapril]    PMHx:   Past Medical History  Diagnosis Date  . Hypomagnesemia   . Neuropathy ([place])   . Diverticulosis   . Hx of adenomatous colonic polyps   . Hypertension   . GERD (gastroesophageal reflux disease)   . Hyperlipidemia   . Prediabetes   . Vitamin D deficiency   . RLS (restless legs syndrome)   . Vitamin B12 deficiency   . Anxiety   . COPD ([place])    Immunization History  Administered Date(s) Administered  . Influenza Split 01/18/2013  . Influenza, High Dose Seasonal PF 12/05/2014  . Pneumococcal Conjugate-13 11/27/2013  . Pneumococcal Polysaccharide-23 11/19/2011  . Td 08/13/2008  .  Zoster 04/06/2010   Past Surgical History  Procedure Laterality Date  . Appendectomy    . Tonsillectomy    . Colonoscopy  - Surgeon: Lafayette Dragon, MD N/A 05/18/2014   FHx:    Reviewed / unchanged  SHx:    Reviewed / unchanged  Systems Review:  Constitutional: Denies fever, chills, wt changes, headaches, insomnia, fatigue, night sweats, change in appetite. Eyes: Denies redness, blurred vision, diplopia, discharge, itchy, watery eyes.  ENT: Denies discharge, congestion, post nasal drip, epistaxis, sore throat, earache, hearing loss, dental pain, tinnitus, vertigo, sinus pain, snoring.  CV: Denies chest pain, palpitations, irregular heartbeat, syncope, dyspnea, diaphoresis, orthopnea, PND, claudication or edema. Respiratory: denies cough, dyspnea, DOE, pleurisy, hoarseness, laryngitis, wheezing.   Gastrointestinal: Denies dysphagia, odynophagia, heartburn, reflux, water brash, abdominal pain or cramps, nausea, vomiting, bloating, diarrhea, constipation, hematemesis, melena, hematochezia  or hemorrhoids. Genitourinary: Denies dysuria, frequency, urgency, nocturia, hesitancy, discharge, hematuria or flank pain. Musculoskeletal: Denies arthralgias, myalgias, stiffness, jt. swelling, pain, limping or strain/sprain.  Skin: Denies pruritus, rash, hives, warts, acne, eczema or change in skin lesion(s). Neuro: No weakness, tremor, incoordination, spasms, paresthesia or pain. Psychiatric: Denies confusion, memory loss or sensory loss. Endo: Denies change in weight, skin or hair change.  Heme/Lymph: No excessive bleeding, bruising or enlarged lymph nodes.  Physical Exam  BP 126/80   Pulse 80  Temp 97.4 F Resp 16  Ht 5\' 3"   Wt 128 lb 6.4 oz     BMI 22.75   Appears well nourished and in no distress.  Eyes: PERRLA, EOMs, conjunctiva no swelling or erythema. Sinuses: No frontal/maxillary tenderness ENT/Mouth: EAC's clear, TM's nl w/o erythema, bulging. Nares clear w/o erythema, swelling, exudates. Oropharynx clear without erythema or exudates. Oral hygiene is good. Tongue normal, non obstructing. Hearing intact.  Neck: Supple. Thyroid nl. Car 2+/2+ without bruits, nodes or JVD. Chest: Respirations nl with BS clear & equal w/o rales, rhonchi, wheezing or stridor.  Cor: Heart sounds normal w/ regular rate and rhythm without sig. murmurs, gallops, clicks, or rubs. Peripheral pulses normal and equal  without edema.  Abdomen: Soft & bowel sounds normal. Non-tender w/o guarding, rebound, hernias, masses, or organomegaly.  Lymphatics: Unremarkable.  Musculoskeletal: Full ROM all peripheral extremities, joint stability, 5/5 strength, and normal gait.  Skin: Warm, dry without exposed rashes, lesions or ecchymosis apparent.  Neuro: Cranial nerves intact, reflexes equal bilaterally. Sensory-motor  testing grossly intact. Tendon reflexes grossly intact.  Pysch: Alert & oriented x 3.  Insight and judgement nl & appropriate. No ideations.  Assessment and Plan:   1. Essential hypertension  - Continue medication, monitor blood pressure at home. Continue DASH diet. Reminder to go to the ER if any CP, SOB, nausea, dizziness, severe HA, changes vision/speech, left arm numbness and tingling and jaw pain. - TSH  2. Hyperlipidemia  - Continue diet/meds, exercise,& lifestyle modifications. Continue monitor periodic cholesterol/liver & renal functions  - Lipid panel - TSH  3. Prediabetes  - Continue diet, exercise, lifestyle modifications. Monitor appropriate labs. - Hemoglobin A1c - Insulin, random  4. Vitamin D deficiency  - Continue supplementation. - VITAMIN D 25 Hydroxy  5. Gastroesophageal reflux disease  - omeprazole 40 MG capsule; Take 1 capsule daily for acid reflux  Disp: 90 cap; Refill: 1  6. Pulmonary emphysema ([place])   7. Medication management  - CBC with Differential/Platelet - BASIC METABOLIC PANEL WITH GFR - Hepatic function panel - Magnesium  8. Osteoporosis   - d/c Boniva due to cost / high  co-pay and switch to Fosamax   Recommended regular exercise, BP monitoring, weight control, and discussed med and SE's. Recommended labs to assess and monitor clinical status. Further disposition pending results of labs. Over 30 minutes of exam, counseling, chart review was performed

## 2015-10-24 ENCOUNTER — Encounter: Payer: Self-pay | Admitting: Internal Medicine

## 2015-10-24 LAB — CBC WITH DIFFERENTIAL/PLATELET
BASOS ABS: 0 {cells}/uL (ref 0–200)
BASOS PCT: 0 %
EOS ABS: 58 {cells}/uL (ref 15–500)
EOS PCT: 1 %
HCT: 37.1 % (ref 35.0–45.0)
HEMOGLOBIN: 12.3 g/dL (ref 11.7–15.5)
LYMPHS ABS: 1624 {cells}/uL (ref 850–3900)
Lymphocytes Relative: 28 %
MCH: 30.8 pg (ref 27.0–33.0)
MCHC: 33.2 g/dL (ref 32.0–36.0)
MCV: 92.8 fL (ref 80.0–100.0)
MPV: 10.2 fL (ref 7.5–12.5)
Monocytes Absolute: 290 cells/uL (ref 200–950)
Monocytes Relative: 5 %
NEUTROS ABS: 3828 {cells}/uL (ref 1500–7800)
Neutrophils Relative %: 66 %
Platelets: 233 10*3/uL (ref 140–400)
RBC: 4 MIL/uL (ref 3.80–5.10)
RDW: 14.1 % (ref 11.0–15.0)
WBC: 5.8 10*3/uL (ref 3.8–10.8)

## 2015-10-24 LAB — LIPID PANEL
CHOLESTEROL: 188 mg/dL (ref 125–200)
HDL: 40 mg/dL — ABNORMAL LOW (ref >=46)
LDL Cholesterol: 115 mg/dL (ref <130)
TRIGLYCERIDES: 165 mg/dL — AB (ref <150)
Total CHOL/HDL Ratio: 4.7 Ratio (ref <=5.0)
VLDL: 33 mg/dL — ABNORMAL HIGH (ref <30)

## 2015-10-24 LAB — BASIC METABOLIC PANEL WITH GFR
BUN: 10 mg/dL (ref 7–25)
CHLORIDE: 102 mmol/L (ref 98–110)
CO2: 27 mmol/L (ref 20–31)
CREATININE: 1.04 mg/dL — AB (ref 0.60–0.93)
Calcium: 9.5 mg/dL (ref 8.6–10.4)
GFR, Est African American: 62 mL/min (ref >=60)
GFR, Est Non African American: 53 mL/min — ABNORMAL LOW (ref >=60)
Glucose, Bld: 77 mg/dL (ref 65–99)
POTASSIUM: 4.4 mmol/L (ref 3.5–5.3)
SODIUM: 140 mmol/L (ref 135–146)

## 2015-10-24 LAB — VITAMIN D 25 HYDROXY (VIT D DEFICIENCY, FRACTURES): VIT D 25 HYDROXY: 67 ng/mL (ref 30–100)

## 2015-10-24 LAB — HEPATIC FUNCTION PANEL
ALK PHOS: 61 U/L (ref 33–130)
ALT: 16 U/L (ref 6–29)
AST: 17 U/L (ref 10–35)
Albumin: 4.3 g/dL (ref 3.6–5.1)
BILIRUBIN DIRECT: 0.1 mg/dL (ref <=0.2)
Indirect Bilirubin: 0.3 mg/dL (ref 0.2–1.2)
TOTAL PROTEIN: 7 g/dL (ref 6.1–8.1)
Total Bilirubin: 0.4 mg/dL (ref 0.2–1.2)

## 2015-10-24 LAB — MAGNESIUM: MAGNESIUM: 2 mg/dL (ref 1.5–2.5)

## 2015-10-24 LAB — TSH: TSH: 1.54 mIU/L

## 2015-10-24 LAB — HEMOGLOBIN A1C
HEMOGLOBIN A1C: 5.6 % (ref <5.7)
MEAN PLASMA GLUCOSE: 114 mg/dL

## 2015-10-24 LAB — INSULIN, RANDOM: Insulin: 9.3 u[IU]/mL (ref 2.0–19.6)

## 2015-11-20 DIAGNOSIS — H02835 Dermatochalasis of left lower eyelid: Secondary | ICD-10-CM | POA: Diagnosis not present

## 2015-11-20 DIAGNOSIS — H31093 Other chorioretinal scars, bilateral: Secondary | ICD-10-CM | POA: Diagnosis not present

## 2015-11-20 DIAGNOSIS — H25813 Combined forms of age-related cataract, bilateral: Secondary | ICD-10-CM | POA: Diagnosis not present

## 2015-11-20 DIAGNOSIS — H52223 Regular astigmatism, bilateral: Secondary | ICD-10-CM | POA: Diagnosis not present

## 2015-11-20 DIAGNOSIS — H5203 Hypermetropia, bilateral: Secondary | ICD-10-CM | POA: Diagnosis not present

## 2015-11-20 DIAGNOSIS — H524 Presbyopia: Secondary | ICD-10-CM | POA: Diagnosis not present

## 2015-11-20 DIAGNOSIS — H02831 Dermatochalasis of right upper eyelid: Secondary | ICD-10-CM | POA: Diagnosis not present

## 2015-11-20 DIAGNOSIS — H02832 Dermatochalasis of right lower eyelid: Secondary | ICD-10-CM | POA: Diagnosis not present

## 2015-11-20 DIAGNOSIS — H47012 Ischemic optic neuropathy, left eye: Secondary | ICD-10-CM | POA: Diagnosis not present

## 2015-11-20 DIAGNOSIS — H02834 Dermatochalasis of left upper eyelid: Secondary | ICD-10-CM | POA: Diagnosis not present

## 2015-12-05 ENCOUNTER — Encounter: Payer: Self-pay | Admitting: Physician Assistant

## 2015-12-11 ENCOUNTER — Other Ambulatory Visit: Payer: Self-pay | Admitting: Internal Medicine

## 2015-12-11 DIAGNOSIS — H2512 Age-related nuclear cataract, left eye: Secondary | ICD-10-CM | POA: Diagnosis not present

## 2015-12-26 DIAGNOSIS — M9904 Segmental and somatic dysfunction of sacral region: Secondary | ICD-10-CM | POA: Diagnosis not present

## 2015-12-26 DIAGNOSIS — M5136 Other intervertebral disc degeneration, lumbar region: Secondary | ICD-10-CM | POA: Diagnosis not present

## 2015-12-30 ENCOUNTER — Other Ambulatory Visit: Payer: Self-pay | Admitting: Internal Medicine

## 2016-01-06 DIAGNOSIS — H25812 Combined forms of age-related cataract, left eye: Secondary | ICD-10-CM | POA: Diagnosis not present

## 2016-01-06 DIAGNOSIS — H2512 Age-related nuclear cataract, left eye: Secondary | ICD-10-CM | POA: Diagnosis not present

## 2016-01-14 DIAGNOSIS — H2511 Age-related nuclear cataract, right eye: Secondary | ICD-10-CM | POA: Diagnosis not present

## 2016-01-21 DIAGNOSIS — M5136 Other intervertebral disc degeneration, lumbar region: Secondary | ICD-10-CM | POA: Diagnosis not present

## 2016-01-21 DIAGNOSIS — M9904 Segmental and somatic dysfunction of sacral region: Secondary | ICD-10-CM | POA: Diagnosis not present

## 2016-01-23 ENCOUNTER — Encounter: Payer: Self-pay | Admitting: Physician Assistant

## 2016-01-23 ENCOUNTER — Ambulatory Visit (INDEPENDENT_AMBULATORY_CARE_PROVIDER_SITE_OTHER): Payer: Medicare Other | Admitting: Physician Assistant

## 2016-01-23 VITALS — BP 110/60 | HR 63 | Temp 97.6°F | Resp 14 | Ht 63.0 in | Wt 128.8 lb

## 2016-01-23 DIAGNOSIS — R6889 Other general symptoms and signs: Secondary | ICD-10-CM

## 2016-01-23 DIAGNOSIS — I10 Essential (primary) hypertension: Secondary | ICD-10-CM

## 2016-01-23 DIAGNOSIS — G4733 Obstructive sleep apnea (adult) (pediatric): Secondary | ICD-10-CM

## 2016-01-23 DIAGNOSIS — E538 Deficiency of other specified B group vitamins: Secondary | ICD-10-CM

## 2016-01-23 DIAGNOSIS — K219 Gastro-esophageal reflux disease without esophagitis: Secondary | ICD-10-CM

## 2016-01-23 DIAGNOSIS — M810 Age-related osteoporosis without current pathological fracture: Secondary | ICD-10-CM

## 2016-01-23 DIAGNOSIS — K5731 Diverticulosis of large intestine without perforation or abscess with bleeding: Secondary | ICD-10-CM

## 2016-01-23 DIAGNOSIS — Z0001 Encounter for general adult medical examination with abnormal findings: Secondary | ICD-10-CM | POA: Diagnosis not present

## 2016-01-23 DIAGNOSIS — Z23 Encounter for immunization: Secondary | ICD-10-CM

## 2016-01-23 DIAGNOSIS — Z79899 Other long term (current) drug therapy: Secondary | ICD-10-CM

## 2016-01-23 DIAGNOSIS — E785 Hyperlipidemia, unspecified: Secondary | ICD-10-CM | POA: Diagnosis not present

## 2016-01-23 DIAGNOSIS — F419 Anxiety disorder, unspecified: Secondary | ICD-10-CM

## 2016-01-23 DIAGNOSIS — N182 Chronic kidney disease, stage 2 (mild): Secondary | ICD-10-CM

## 2016-01-23 DIAGNOSIS — R220 Localized swelling, mass and lump, head: Secondary | ICD-10-CM

## 2016-01-23 DIAGNOSIS — R7303 Prediabetes: Secondary | ICD-10-CM | POA: Diagnosis not present

## 2016-01-23 DIAGNOSIS — J439 Emphysema, unspecified: Secondary | ICD-10-CM

## 2016-01-23 DIAGNOSIS — Z86010 Personal history of colonic polyps: Secondary | ICD-10-CM

## 2016-01-23 DIAGNOSIS — Z0000 Encounter for general adult medical examination without abnormal findings: Secondary | ICD-10-CM

## 2016-01-23 DIAGNOSIS — Z860101 Personal history of adenomatous and serrated colon polyps: Secondary | ICD-10-CM

## 2016-01-23 DIAGNOSIS — Z9989 Dependence on other enabling machines and devices: Secondary | ICD-10-CM

## 2016-01-23 DIAGNOSIS — G2581 Restless legs syndrome: Secondary | ICD-10-CM

## 2016-01-23 DIAGNOSIS — E559 Vitamin D deficiency, unspecified: Secondary | ICD-10-CM

## 2016-01-23 LAB — TSH: TSH: 1.71 m[IU]/L

## 2016-01-23 LAB — CBC WITH DIFFERENTIAL/PLATELET
BASOS ABS: 0 {cells}/uL (ref 0–200)
Basophils Relative: 0 %
EOS ABS: 66 {cells}/uL (ref 15–500)
Eosinophils Relative: 1 %
HCT: 39.2 % (ref 35.0–45.0)
HEMOGLOBIN: 13.1 g/dL (ref 11.7–15.5)
LYMPHS ABS: 1914 {cells}/uL (ref 850–3900)
Lymphocytes Relative: 29 %
MCH: 31 pg (ref 27.0–33.0)
MCHC: 33.4 g/dL (ref 32.0–36.0)
MCV: 92.7 fL (ref 80.0–100.0)
MONO ABS: 528 {cells}/uL (ref 200–950)
MPV: 10.6 fL (ref 7.5–12.5)
Monocytes Relative: 8 %
NEUTROS ABS: 4092 {cells}/uL (ref 1500–7800)
Neutrophils Relative %: 62 %
Platelets: 216 10*3/uL (ref 140–400)
RBC: 4.23 MIL/uL (ref 3.80–5.10)
RDW: 13.5 % (ref 11.0–15.0)
WBC: 6.6 10*3/uL (ref 3.8–10.8)

## 2016-01-23 NOTE — Patient Instructions (Addendum)
Common causes of cough OR hoarseness OR sore throat:   Allergies, Viral Infections, Acid Reflux and Bacterial Infections.  1) Allergies and viral infections cause a cough OR sore throat by post nasal drip and are often worse at night, can also have sneezing, lower grade fevers, clear/yellow mucus. This is best treated with allergy medications or nasal sprays.  Please get on allegra for 1-2 weeks The strongest is allegra or fexafinadine  Cheapest at [employer], [employer], [employer]  2) Bacterial infections are more severe than allergies or viral infections with fever, teeth pain, fatigue. This can be treated with prednisone and the same over the counter medication and after 7 days can be treated with an antibiotic.   3) Silent reflux/GERD can cause a cough OR sore throat OR hoarseness WITHOUT heart burn because the esophagus that goes to the stomach and trachea that goes to the lungs are very close and when you lay down the acid can irritate your throat and lungs. This can cause hoarseness, cough, and wheezing. Please stop any alcohol or anti-inflammatories like aleve/advil/ibuprofen and start an over the counter Prilosec or omeprazole 1-2 times daily 10mins before food for 2 weeks, then switch to over the counter zantac/ratinidine or pepcid/famotadine once at night for 2 weeks.   4) sometimes irritation causes more irritation. Try voice rest, use sugar free cough drops to prevent coughing, and try to stop clearing your throat.   If you ever have a cough that does not go away after trying these things please make a follow up visit for further evaluation or we can refer you to a specialist. Or if you ever have shortness of breath or chest pain go to the ER.      CALL DR. DONER TO SEE IF HE WANTS [place] TO ORDER CHEST XRAY OR NOT   Osteoporosis Osteoporosis is the thinning and loss of density in the bones. Osteoporosis makes the bones more brittle, fragile, and likely to break (fracture). Over time,  osteoporosis can cause the bones to become so weak that they fracture after a simple fall. The bones most likely to fracture are the bones in the hip, wrist, and spine. CAUSES  The exact cause is not known. RISK FACTORS Anyone can develop osteoporosis. You may be at greater risk if you have a family history of the condition or have poor nutrition. You may also have a higher risk if you are:   Female.   73 years old or older.  A smoker.  Not physically active.   White or Asian.  Slender. SIGNS AND SYMPTOMS  A fracture might be the first sign of the disease, especially if it results from a fall or injury that would not usually cause a bone to break. Other signs and symptoms include:   Low back and neck pain.  Stooped posture.  Height loss. DIAGNOSIS  To make a diagnosis, your health care provider may:  Take a medical history.  Perform a physical exam.  Order tests, such as:  A bone mineral density test.  A dual-energy X-ray absorptiometry test. TREATMENT  The goal of osteoporosis treatment is to strengthen your bones to reduce your risk of a fracture. Treatment may involve:  Making lifestyle changes, such as:  Eating a diet rich in calcium.  Doing weight-bearing and muscle-strengthening exercises.  Stopping tobacco use.  Limiting alcohol intake.  Taking medicine to slow the process of bone loss or to increase bone density.  Monitoring your levels of calcium and vitamin D. HOME  CARE INSTRUCTIONS  Include calcium and vitamin D in your diet. Calcium is important for bone health, and vitamin D helps the body absorb calcium.  Perform weight-bearing and muscle-strengthening exercises as directed by your health care provider.  Do not use any tobacco products, including cigarettes, chewing tobacco, and electronic cigarettes. If you need help quitting, ask your health care provider.  Limit your alcohol intake.  Take medicines only as directed by your health care  provider.  Keep all follow-up visits as directed by your health care provider. This is important.  Take precautions at home to lower your risk of falling, such as:  Keeping rooms well lit and clutter free.  Installing safety rails on stairs.  Using rubber mats in the bathroom and other areas that are often wet or slippery. SEEK IMMEDIATE MEDICAL CARE IF:  You fall or injure yourself.    This information is not intended to replace advice given to you by your health care provider. Make sure you discuss any questions you have with your health care provider.   Document Released: 12/31/2004 Document Revised: 04/13/2014 Document Reviewed: 08/31/2013 Elsevier Interactive Patient Education [employer].

## 2016-01-23 NOTE — Progress Notes (Signed)
Medicare wellness visit and follow up.   Assessment:   Essential hypertension - continue medications, DASH diet, exercise and monitor at home. Call if greater than 130/80.  - CBC with Differential/Platelet - Hepatic function panel - TSH - Urinalysis, Routine w reflex microscopic (not at [facility]) - Microalbumin / creatinine urine ratio - EKG 12-Lead  OSA on CPAP Sleep apnea- continue CPAP, weight loss advised.   Pulmonary emphysema, unspecified emphysema type Follow up pulmonary, continue inhaler.   Prediabetes Discussed general issues about diabetes pathophysiology and management., Educational material distributed., Suggested low cholesterol diet., Encouraged aerobic exercise., Discussed foot care., Reminded to get yearly retinal exam. - Hemoglobin A1c - Insulin, fasting  CKD (chronic kidney disease) stage 2, GFR 60-89 ml/min Increase fluids, avoid NSAIDS, monitor sugars, will monitor - BASIC METABOLIC PANEL WITH GFR  Hyperlipidemia -continue medications, check lipids, decrease fatty foods, increase activity.  - Lipid panel  Vitamin D deficiency - Vit D  25 hydroxy (rtn osteoporosis monitoring)   Medication management - Magnesium   Hypomagnesemia  Vitamin B12 deficiency Continue supplement  Gastroesophageal reflux disease with esophagitis Continue PPI/H2 blocker, diet discussed  Diverticulosis of intestine without bleeding, unspecified intestinal tract location Increase fiber  Neuropathy Continue medications, discussed feet checks   RLS (restless legs syndrome) Exercise   Hx of adenomatous colonic polyps Up to date  Anxiety remission  Urinary frequency - Urine culture  Need for prophylactic vaccination and inoculation against influenza - Flu vaccine HIGH DOSE PF   Osteoporosis Dicussed treatment, recheck 2018  Mass of right submandibular region with hoarseness -     Ambulatory referral to ENT - 30 pack year smoking history will refer to Dr. Lucia Gaskins  to rule out CA - very mobile mass but husband passed from esophageal cancer and is very anxious - continue allergy pill and prilosec, small frequent meals.      Plan:   During the course of the visit the patient was educated and counseled about appropriate screening and preventive services including:    Pneumococcal vaccine   Influenza vaccine  Td vaccine  Screening electrocardiogram  Screening mammography  Bone densitometry screening  Colorectal cancer screening  Diabetes screening  Glaucoma screening  Nutrition counseling   .  Subjective:   Renee Hawkins is a 75 y.o. female who presents for Medicare Annual Wellness Visit and complete physical.     She has had hoarseness x 6 months, she has had nodule on right submandible x 6 months as well, not tender, not getting bigger. She will have some food getting stuck with swallowing, on prilosec. She has 30 pack year smoking history.   Her blood pressure has been controlled at home, today their BP is BP: 110/60 She does workout, still at [employer].  She denies chest pain, shortness of breath, dizziness.  She has COPD with OSA, on CPAP and spiriva, well controlled. Follows with Dr. Welford Roche.  She is on cholesterol medication, lipitor 80mg  1/2 pill 3 days a week and denies myalgias. Her cholesterol is at goal. The cholesterol last visit was:   Lab Results  Component Value Date   CHOL 188 10/23/2015   HDL 40 (L) 10/23/2015   LDLCALC 115 10/23/2015   TRIG 165 (H) 10/23/2015   CHOLHDL 4.7 10/23/2015   Lab Results  Component Value Date   GFRNONAA 53 (L) 10/23/2015   She has been working on diet and exercise for prediabetes, and denies nausea, paresthesia of the feet and polydipsia. Last A1C in the  office was:  Lab Results  Component Value Date   HGBA1C 5.6 10/23/2015   Patient is on Vitamin D supplement.   Lab Results  Component Value Date   VD25OH 67 10/23/2015   She rarely takes the lorazepam for  sleep.  She is on fosamax for osteoporosis x 2016  She is on potassium twice a day or 67meq total.    Names of Other Physician/Practitioners you currently use: 1. [facility]- here for primary care 2. [place] [facility], last visit Yearly, had left cataract removal 2 weeks ago and getting right done in 2 weeks 3. dentist, last visit as needed Patient Care Team: Unk Pinto, MD as PCP - General (Internal Medicine) Lafayette Dragon, MD (Inactive) as Consulting Physician (Gastroenterology) Loletha Carrow, MD (Pulmonary Disease) Calvert Cantor, MD as Consulting Physician (Ophthalmology)   Medication Review Current Outpatient Prescriptions on File Prior to Visit  Medication Sig Dispense Refill  . alendronate (FOSAMAX) 70 MG tablet Take 1 tablet weekly with a full glass of water for 30 minutes on an empty stomach 12 tablet 3  . atorvastatin (LIPITOR) 80 MG tablet TAKE ONE TABLET BY MOUTH ONCE DAILY FOR CHOLESTEROL 30 tablet 3  . Cholecalciferol 4000 UNITS CAPS Take 1 capsule by mouth daily.    . Ferrous Sulfate Dried (SLOW RELEASE IRON) 45 MG TBCR Take by mouth daily.    . fluticasone (FLONASE) 50 MCG/ACT nasal spray Place 2 sprays into both nostrils daily. 16 g 0  . gabapentin (NEURONTIN) 300 MG capsule Take 1 capsule 3 to 4 x/day for pain. 360 capsule 1  . Magnesium 300 MG CAPS Take 1 capsule by mouth 3 (three) times daily.     Marland Kitchen omeprazole (PRILOSEC) 40 MG capsule Take 1 capsule daily for acid reflux 90 capsule 1  . potassium chloride (MICRO-K) 10 MEQ CR capsule TAKE TWO CAPSULES BY MOUTH ONCE DAILY 180 capsule 1  . tiotropium (SPIRIVA) 18 MCG inhalation capsule Place 18 mcg into inhaler and inhale daily.    . verapamil (CALAN-SR) 240 MG CR tablet TAKE ONE TABLET BY MOUTH ONCE DAILY AFTER A MEAL FOR BLOOD PRESSURE 90 tablet 0   No current facility-administered medications on file prior to visit.     Current Problems (verified) Patient Active Problem  List   Diagnosis Date Noted  . OSA on CPAP 12/05/2014  . Osteoporosis 12/05/2014  . Medication management 03/06/2014  . CKD (chronic kidney disease) stage 2, GFR 60-89 ml/min 11/27/2013  . COPD (chronic obstructive pulmonary disease) with emphysema ([place]) 11/27/2013  . Hypertension   . Diverticulosis of colon with hemorrhage   . GERD    . Hx of adenomatous colonic polyps   . Hyperlipidemia   . Prediabetes   . Vitamin D deficiency   . RLS (restless legs syndrome)   . Vitamin B12 deficiency   . Anxiety     Screening Tests Immunization History  Administered Date(s) Administered  . Influenza Split 01/18/2013  . Influenza, High Dose Seasonal PF 12/05/2014, 01/23/2016  . Pneumococcal Conjugate-13 11/27/2013  . Pneumococcal Polysaccharide-23 11/19/2011  . Td 08/13/2008  . Zoster 04/06/2010   Preventative care: Last colonoscopy: 05/2014 Last mammogram: 02/2015 Last pap smear/pelvic exam: Dr. Irven [place]  DEXA:02/2015 CT chest 2015, stable pulmonary nodule  Prior vaccinations: UPTODATE SEE LIST, getting flu shot today  Allergies Allergies  Allergen Reactions  . Atenolol   . Clonazepam   . Levaquin [Levofloxacin In D5w]   . Vasotec [Enalapril]  SURGICAL HISTORY She  has a past surgical history that includes Appendectomy; Tonsillectomy; Colonoscopy (N/A, 05/18/2014); and Eye surgery (2017). FAMILY HISTORY Her family history includes Stroke in her maternal grandmother and mother. SOCIAL HISTORY She  reports that she quit smoking about 3 years ago. Her smoking use included Cigarettes. She has a 30.00 pack-year smoking history. She has never used smokeless tobacco. She reports that she does not drink alcohol or use drugs.  MEDICARE WELLNESS OBJECTIVES: Tobacco use: She does not smoke.  Patient is a former smoker. If yes, counseling given Alcohol Current alcohol use: none Osteoporosis: postmenopausal estrogen deficiency and dietary calcium and/or vitamin D deficiency,  History of fracture in the past year: no Fall risk: Low Risk Hearing: normal Visual acuity: normal,  does perform annual eye exam Diet: in general, a "healthy" diet   Physical activity:   Cardiac risk factors:   Depression/mood screen:   Depression screen [facility] 2/9 10/23/2015  Decreased Interest 0  Down, Depressed, Hopeless 0  PHQ - 2 Score 0    ADLs:  In your present state of health, do you have any difficulty performing the following activities: 10/23/2015 03/29/2015  Hearing? N N  Vision? N N  Difficulty concentrating or making decisions? N N  Walking or climbing stairs? N N  Dressing or bathing? N N  Doing errands, shopping? N N  Some recent data might be hidden    Cognitive Testing  Alert? Yes  Normal Appearance?Yes  Oriented to person? Yes  Place? Yes   Time? Yes  Recall of three objects?  Yes  Can perform simple calculations? Yes  Displays appropriate judgment?Yes  Can read the correct time from a watch face?Yes  EOL planning: Does patient have an advance directive?: Yes Copy of advanced directive(s) in chart?: No - copy requested  Review of Systems  Constitutional: Negative.   HENT: Negative.        + Snoring, better on CPAP  Respiratory: Positive for shortness of breath (improved with CPAP, not using O2 anymore). Negative for cough, hemoptysis, sputum production and wheezing.   Cardiovascular: Negative.   Gastrointestinal: Negative for abdominal pain, blood in stool, constipation, diarrhea, heartburn, melena, nausea and vomiting.  Genitourinary: Positive for frequency. Negative for dysuria, flank pain, hematuria and urgency.  Musculoskeletal: Negative.  Negative for falls.  Skin: Negative.   Neurological: Positive for sensory change (bilateral legs). Negative for dizziness, tingling, tremors, speech change, focal weakness, seizures and loss of consciousness.  Psychiatric/Behavioral: Negative.  Negative for depression.    Objective:     Blood pressure 110/60,  pulse 63, temperature 97.6 F (36.4 C), resp. rate 14, height 5\' 3"  (1.6 m), weight 128 lb 12.8 oz (58.4 kg), SpO2 95 %. Body mass index is 22.82 kg/m.  General appearance: alert, no distress, WD/WN,  female HEENT: normocephalic, sclerae anicteric, TMs pearly, nares patent, no discharge or erythema, pharynx normal Oral cavity: MMM, no lesions Neck: supple, right submandibular 2x3 cm nontender mobile mass , no thyromegaly, no masses Heart: RRR, normal S1, S2, no murmurs Lungs: CTA bilaterally, no wheezes, rhonchi, or rales Abdomen: +bs, soft, diffuse tenderness, no rebound, distended, no masses, no hepatomegaly, no splenomegaly Musculoskeletal: nontender, no swelling, no obvious deformity Extremities: no edema, no cyanosis, no clubbing Pulses: 2+ symmetric, upper and lower extremities, normal cap refill Neurological: alert, oriented x 3, CN2-12 intact, strength normal upper extremities and lower extremities, sensation normal throughout, DTRs 2+ throughout, no cerebellar signs, gait normal Psychiatric: normal affect, behavior normal, pleasant  Breast:  defer Gyn: defer  Rectal:   Medicare Attestation I have personally reviewed: The patient's medical and social history Their use of alcohol, tobacco or illicit drugs Their current medications and supplements The patient's functional ability including ADLs,fall risks, home safety risks, cognitive, and hearing and visual impairment Diet and physical activities Evidence for depression or mood disorders  The patient's weight, height, BMI, and visual acuity have been recorded in the chart.  I have made referrals, counseling, and provided education to the patient based on review of the above and I have provided the patient with a written personalized care plan for preventive services.     Vicie Mutters, PA-C   01/23/2016

## 2016-01-24 DIAGNOSIS — R590 Localized enlarged lymph nodes: Secondary | ICD-10-CM | POA: Diagnosis not present

## 2016-01-24 LAB — HEPATIC FUNCTION PANEL
ALT: 19 U/L (ref 6–29)
AST: 21 U/L (ref 10–35)
Albumin: 4.2 g/dL (ref 3.6–5.1)
Alkaline Phosphatase: 53 U/L (ref 33–130)
BILIRUBIN DIRECT: 0.1 mg/dL (ref <=0.2)
Indirect Bilirubin: 0.3 mg/dL (ref 0.2–1.2)
Total Bilirubin: 0.4 mg/dL (ref 0.2–1.2)
Total Protein: 6.8 g/dL (ref 6.1–8.1)

## 2016-01-24 LAB — URINALYSIS, ROUTINE W REFLEX MICROSCOPIC
Bilirubin Urine: NEGATIVE
Glucose, UA: NEGATIVE
HGB URINE DIPSTICK: NEGATIVE
Ketones, ur: NEGATIVE
LEUKOCYTES UA: NEGATIVE
NITRITE: NEGATIVE
PROTEIN: NEGATIVE
Specific Gravity, Urine: 1.02 (ref 1.001–1.035)
pH: 7 (ref 5.0–8.0)

## 2016-01-24 LAB — HEMOGLOBIN A1C
HEMOGLOBIN A1C: 5.3 % (ref <5.7)
MEAN PLASMA GLUCOSE: 105 mg/dL

## 2016-01-24 LAB — LIPID PANEL
CHOL/HDL RATIO: 5.2 ratio — AB (ref <=5.0)
Cholesterol: 224 mg/dL — ABNORMAL HIGH (ref 125–200)
HDL: 43 mg/dL — ABNORMAL LOW (ref >=46)
LDL CALC: 137 mg/dL — AB (ref <130)
Triglycerides: 219 mg/dL — ABNORMAL HIGH (ref <150)
VLDL: 44 mg/dL — ABNORMAL HIGH (ref <30)

## 2016-01-24 LAB — BASIC METABOLIC PANEL WITH GFR
BUN: 12 mg/dL (ref 7–25)
CHLORIDE: 100 mmol/L (ref 98–110)
CO2: 28 mmol/L (ref 20–31)
CREATININE: 1.04 mg/dL — AB (ref 0.60–0.93)
Calcium: 9.4 mg/dL (ref 8.6–10.4)
GFR, Est African American: 62 mL/min (ref >=60)
GFR, Est Non African American: 53 mL/min — ABNORMAL LOW (ref >=60)
Glucose, Bld: 79 mg/dL (ref 65–99)
Potassium: 4 mmol/L (ref 3.5–5.3)
SODIUM: 142 mmol/L (ref 135–146)

## 2016-01-24 LAB — MICROALBUMIN / CREATININE URINE RATIO
Creatinine, Urine: 162 mg/dL (ref 20–320)
MICROALB UR: 0.7 mg/dL
MICROALB/CREAT RATIO: 4 ug/mg{creat} (ref <30)

## 2016-01-24 LAB — MAGNESIUM: Magnesium: 1.9 mg/dL (ref 1.5–2.5)

## 2016-01-27 DIAGNOSIS — H25811 Combined forms of age-related cataract, right eye: Secondary | ICD-10-CM | POA: Diagnosis not present

## 2016-01-27 DIAGNOSIS — H2511 Age-related nuclear cataract, right eye: Secondary | ICD-10-CM | POA: Diagnosis not present

## 2016-01-28 ENCOUNTER — Other Ambulatory Visit: Payer: Self-pay | Admitting: Otolaryngology

## 2016-01-28 DIAGNOSIS — R599 Enlarged lymph nodes, unspecified: Secondary | ICD-10-CM

## 2016-01-28 DIAGNOSIS — R591 Generalized enlarged lymph nodes: Secondary | ICD-10-CM

## 2016-01-29 ENCOUNTER — Ambulatory Visit
Admission: RE | Admit: 2016-01-29 | Discharge: 2016-01-29 | Disposition: A | Discharge status: Home / Self Care | Payer: Medicare Other | Source: Ambulatory Visit | Attending: Otolaryngology | Admitting: Otolaryngology

## 2016-01-29 ENCOUNTER — Ambulatory Visit: Payer: Self-pay | Admitting: Otolaryngology

## 2016-01-29 ENCOUNTER — Encounter (HOSPITAL_BASED_OUTPATIENT_CLINIC_OR_DEPARTMENT_OTHER): Payer: Self-pay | Admitting: *Deleted

## 2016-01-29 DIAGNOSIS — R591 Generalized enlarged lymph nodes: Secondary | ICD-10-CM

## 2016-01-29 DIAGNOSIS — R599 Enlarged lymph nodes, unspecified: Secondary | ICD-10-CM

## 2016-01-29 DIAGNOSIS — R590 Localized enlarged lymph nodes: Secondary | ICD-10-CM | POA: Diagnosis not present

## 2016-01-29 MED ORDER — IOPAMIDOL (ISOVUE-300) INJECTION 61%
75.0000 mL | Freq: Once | INTRAVENOUS | Status: AC | PRN
  Administered 2016-01-29: 75 mL via INTRAVENOUS

## 2016-01-29 NOTE — H&P (Signed)
PREOPERATIVE H&P  Chief Complaint: right neck mass  HPI: Renee Hawkins is a 75 y.o. female who presents for evaluation of right neck mass she has had for 7 or 8 months. It has not been painful. On exam she has a 1.5 firm non tender right submandibular neck mass which is suspicious for a neoplastic nodule. She's taken to the OR for excisional biopsy.  Past Medical History:  Diagnosis Date  . Anxiety   . COPD (chronic obstructive pulmonary disease) ([place])   . Diverticulosis   . GERD (gastroesophageal reflux disease)   . Hx of adenomatous colonic polyps   . Hyperlipidemia   . Hypertension   . Hypomagnesemia   . Neuropathy ([place])   . Prediabetes   . RLS (restless legs syndrome)   . Vitamin B12 deficiency   . Vitamin D deficiency    Past Surgical History:  Procedure Laterality Date  . APPENDECTOMY    . COLONOSCOPY N/A 05/18/2014   Procedure: COLONOSCOPY;  Surgeon: Lafayette Dragon, MD;  Location: [facility] ENDOSCOPY;  Service: Endoscopy;  Laterality: N/A;  . EYE SURGERY  2017   Bilateral cataract Dr. Bing Plume  . TONSILLECTOMY     Social History   Social History  . Marital status: Widowed    Spouse name: N/A  . Number of children: 0  . Years of education: N/A   Occupational History  . Retired  Retired   Social History Main Topics  . Smoking status: Former Smoker    Packs/day: 1.00    Years: 30.00    Types: Cigarettes    Quit date: 04/06/2012  . Smokeless tobacco: Never Used  . Alcohol use No  . Drug use: No  . Sexual activity: Not Currently   Other Topics Concern  . Not on file   Social History Narrative   Daily caffeine    Family History  Problem Relation Age of Onset  . Stroke Mother   . Stroke Maternal Grandmother   . Colon cancer Neg Hx      Husband with history of BOT cancer 8 years ago. Allergies  Allergen Reactions  . Atenolol   . Clonazepam   . Levaquin [Levofloxacin In D5w]   . Vasotec [Enalapril]    Prior to Admission medications   Medication Sig Start  Date End Date Taking? Authorizing Provider  alendronate (FOSAMAX) 70 MG tablet Take 1 tablet weekly with a full glass of water for 30 minutes on an empty stomach 10/23/15   Unk Pinto, MD  atorvastatin (LIPITOR) 80 MG tablet TAKE ONE TABLET BY MOUTH ONCE DAILY FOR CHOLESTEROL 01/21/15   Unk Pinto, MD  Cholecalciferol 4000 UNITS CAPS Take 1 capsule by mouth daily.    Historical Provider, MD  Ferrous Sulfate Dried (SLOW RELEASE IRON) 45 MG TBCR Take by mouth daily.    Historical Provider, MD  fluticasone (FLONASE) 50 MCG/ACT nasal spray Place 2 sprays into both nostrils daily. 07/24/15   Courtney Forcucci, PA-C  gabapentin (NEURONTIN) 300 MG capsule Take 1 capsule 3 to 4 x/day for pain. 10/23/15   Unk Pinto, MD  Magnesium 300 MG CAPS Take 1 capsule by mouth 3 (three) times daily.     Historical Provider, MD  omeprazole (PRILOSEC) 40 MG capsule Take 1 capsule daily for acid reflux 10/23/15 04/24/16  Unk Pinto, MD  potassium chloride (MICRO-K) 10 MEQ CR capsule TAKE TWO CAPSULES BY MOUTH ONCE DAILY 12/11/15   Unk Pinto, MD  tiotropium (SPIRIVA) 18 MCG inhalation capsule Place 18 mcg into  inhaler and inhale daily.    Historical Provider, MD  verapamil (CALAN-SR) 240 MG CR tablet TAKE ONE TABLET BY MOUTH ONCE DAILY AFTER A MEAL FOR BLOOD PRESSURE 12/30/15   Vicie Mutters, PA-C     Positive ROS: No sore throat  All other systems have been reviewed and were otherwise negative with the exception of those mentioned in the HPI and as above.  Physical Exam: There were no vitals filed for this visit.  General: Alert, no acute distress Oral: Normal oral mucosa s\p tonsillectomy Nasal: Clear nasal passages    FOL normal upper airway exam Neck: 2 cm firm nodule separate from submandibular gland Ear: Ear canal is clear with normal appearing TMs Cardiovascular: Regular rate and rhythm, no murmur.  Respiratory: Clear to auscultation Neurologic: Alert and oriented x  3   Assessment/Plan: submandibular adenopathy, localized enlarged lymph node Plan for Procedure(s): EXCISIONAL BIOPSY RIGHT NECK LYMPH NODE   Melony Overly, MD 01/29/2016 11:19 AM

## 2016-02-04 ENCOUNTER — Ambulatory Visit (HOSPITAL_BASED_OUTPATIENT_CLINIC_OR_DEPARTMENT_OTHER): Payer: Medicare Other | Admitting: Anesthesiology

## 2016-02-04 ENCOUNTER — Encounter (HOSPITAL_BASED_OUTPATIENT_CLINIC_OR_DEPARTMENT_OTHER): Payer: Self-pay | Admitting: *Deleted

## 2016-02-04 ENCOUNTER — Encounter (HOSPITAL_BASED_OUTPATIENT_CLINIC_OR_DEPARTMENT_OTHER)
Admission: RE | Disposition: A | Discharge status: Home / Self Care | Payer: Self-pay | Source: Ambulatory Visit | Attending: Otolaryngology

## 2016-02-04 ENCOUNTER — Ambulatory Visit (HOSPITAL_BASED_OUTPATIENT_CLINIC_OR_DEPARTMENT_OTHER)
Admission: RE | Admit: 2016-02-04 | Discharge: 2016-02-04 | Disposition: A | Discharge status: Home / Self Care | Payer: Medicare Other | Source: Ambulatory Visit | Attending: Otolaryngology | Admitting: Otolaryngology

## 2016-02-04 DIAGNOSIS — R591 Generalized enlarged lymph nodes: Secondary | ICD-10-CM | POA: Diagnosis present

## 2016-02-04 DIAGNOSIS — Z823 Family history of stroke: Secondary | ICD-10-CM | POA: Insufficient documentation

## 2016-02-04 DIAGNOSIS — Z87891 Personal history of nicotine dependence: Secondary | ICD-10-CM | POA: Insufficient documentation

## 2016-02-04 DIAGNOSIS — F419 Anxiety disorder, unspecified: Secondary | ICD-10-CM | POA: Diagnosis not present

## 2016-02-04 DIAGNOSIS — E559 Vitamin D deficiency, unspecified: Secondary | ICD-10-CM | POA: Insufficient documentation

## 2016-02-04 DIAGNOSIS — G2581 Restless legs syndrome: Secondary | ICD-10-CM | POA: Diagnosis not present

## 2016-02-04 DIAGNOSIS — I10 Essential (primary) hypertension: Secondary | ICD-10-CM | POA: Insufficient documentation

## 2016-02-04 DIAGNOSIS — G4730 Sleep apnea, unspecified: Secondary | ICD-10-CM | POA: Diagnosis not present

## 2016-02-04 DIAGNOSIS — Z881 Allergy status to other antibiotic agents status: Secondary | ICD-10-CM | POA: Diagnosis not present

## 2016-02-04 DIAGNOSIS — E538 Deficiency of other specified B group vitamins: Secondary | ICD-10-CM | POA: Insufficient documentation

## 2016-02-04 DIAGNOSIS — I129 Hypertensive chronic kidney disease with stage 1 through stage 4 chronic kidney disease, or unspecified chronic kidney disease: Secondary | ICD-10-CM | POA: Diagnosis not present

## 2016-02-04 DIAGNOSIS — E785 Hyperlipidemia, unspecified: Secondary | ICD-10-CM | POA: Diagnosis not present

## 2016-02-04 DIAGNOSIS — J449 Chronic obstructive pulmonary disease, unspecified: Secondary | ICD-10-CM | POA: Insufficient documentation

## 2016-02-04 DIAGNOSIS — C8231 Follicular lymphoma grade IIIa, lymph nodes of head, face, and neck: Secondary | ICD-10-CM | POA: Diagnosis not present

## 2016-02-04 DIAGNOSIS — Z8719 Personal history of other diseases of the digestive system: Secondary | ICD-10-CM | POA: Insufficient documentation

## 2016-02-04 DIAGNOSIS — Z888 Allergy status to other drugs, medicaments and biological substances status: Secondary | ICD-10-CM | POA: Diagnosis not present

## 2016-02-04 DIAGNOSIS — Z86010 Personal history of colonic polyps: Secondary | ICD-10-CM | POA: Diagnosis not present

## 2016-02-04 DIAGNOSIS — K219 Gastro-esophageal reflux disease without esophagitis: Secondary | ICD-10-CM | POA: Diagnosis not present

## 2016-02-04 DIAGNOSIS — R599 Enlarged lymph nodes, unspecified: Secondary | ICD-10-CM | POA: Diagnosis not present

## 2016-02-04 DIAGNOSIS — R7303 Prediabetes: Secondary | ICD-10-CM | POA: Insufficient documentation

## 2016-02-04 DIAGNOSIS — C8239 Follicular lymphoma grade IIIa, extranodal and solid organ sites: Secondary | ICD-10-CM | POA: Diagnosis not present

## 2016-02-04 HISTORY — PX: LYMPH NODE BIOPSY: SHX201

## 2016-02-04 HISTORY — DX: Polyneuropathy, unspecified: G62.9

## 2016-02-04 HISTORY — PX: DIRECT LARYNGOSCOPY: SHX5326

## 2016-02-04 HISTORY — DX: Sleep apnea, unspecified: G47.30

## 2016-02-04 SURGERY — LYMPH NODE BIOPSY
Anesthesia: General | Site: Throat | Laterality: Right

## 2016-02-04 MED ORDER — LIDOCAINE HCL (CARDIAC) 20 MG/ML IV SOLN
INTRAVENOUS | Status: DC | PRN
  Administered 2016-02-04: 30 mg via INTRAVENOUS

## 2016-02-04 MED ORDER — LIDOCAINE-EPINEPHRINE 1 %-1:100000 IJ SOLN
INTRAMUSCULAR | Status: DC | PRN
  Administered 2016-02-04: 2 mL

## 2016-02-04 MED ORDER — CEFAZOLIN SODIUM 1 G IJ SOLR
INTRAMUSCULAR | Status: AC
  Filled 2016-02-04: qty 20

## 2016-02-04 MED ORDER — SODIUM CHLORIDE 0.9 % IJ SOLN
INTRAMUSCULAR | Status: AC
  Filled 2016-02-04: qty 10

## 2016-02-04 MED ORDER — SCOPOLAMINE 1 MG/3DAYS TD PT72: 1.0000 | MEDICATED_PATCH | Freq: Once | TRANSDERMAL | Status: DC | PRN

## 2016-02-04 MED ORDER — FENTANYL CITRATE (PF) 100 MCG/2ML IJ SOLN: 25.0000 ug | INTRAMUSCULAR | Status: DC | PRN

## 2016-02-04 MED ORDER — FENTANYL CITRATE (PF) 100 MCG/2ML IJ SOLN: 50.0000 ug | INTRAMUSCULAR | Status: DC | PRN

## 2016-02-04 MED ORDER — FENTANYL CITRATE (PF) 100 MCG/2ML IJ SOLN
INTRAMUSCULAR | Status: AC
  Filled 2016-02-04: qty 2

## 2016-02-04 MED ORDER — PROPOFOL 10 MG/ML IV BOLUS
INTRAVENOUS | Status: AC
  Filled 2016-02-04: qty 20

## 2016-02-04 MED ORDER — LIDOCAINE-EPINEPHRINE 1 %-1:100000 IJ SOLN
INTRAMUSCULAR | Status: AC
  Filled 2016-02-04: qty 1

## 2016-02-04 MED ORDER — PHENYLEPHRINE 40 MCG/ML (10ML) SYRINGE FOR IV PUSH (FOR BLOOD PRESSURE SUPPORT)
PREFILLED_SYRINGE | INTRAVENOUS | Status: AC
  Filled 2016-02-04: qty 10

## 2016-02-04 MED ORDER — ONDANSETRON HCL 4 MG/2ML IJ SOLN
INTRAMUSCULAR | Status: AC
Start: 2016-02-04 — End: 2016-02-04
  Filled 2016-02-04: qty 2

## 2016-02-04 MED ORDER — PROPOFOL 10 MG/ML IV BOLUS
INTRAVENOUS | Status: DC | PRN
  Administered 2016-02-04: 100 mg via INTRAVENOUS

## 2016-02-04 MED ORDER — FENTANYL CITRATE (PF) 100 MCG/2ML IJ SOLN
INTRAMUSCULAR | Status: DC | PRN
  Administered 2016-02-04: 100 ug via INTRAVENOUS

## 2016-02-04 MED ORDER — CEFAZOLIN SODIUM-DEXTROSE 2-4 GM/100ML-% IV SOLN
2.0000 g | INTRAVENOUS | Status: AC
  Administered 2016-02-04: 2 g via INTRAVENOUS

## 2016-02-04 MED ORDER — LIDOCAINE 2% (20 MG/ML) 5 ML SYRINGE
INTRAMUSCULAR | Status: AC
  Filled 2016-02-04: qty 5

## 2016-02-04 MED ORDER — ONDANSETRON HCL 4 MG/2ML IJ SOLN: 4.0000 mg | Freq: Once | INTRAMUSCULAR | Status: DC | PRN

## 2016-02-04 MED ORDER — LACTATED RINGERS IV SOLN
INTRAVENOUS | Status: DC
  Administered 2016-02-04 (×2): via INTRAVENOUS

## 2016-02-04 MED ORDER — BACITRACIN ZINC 500 UNIT/GM EX OINT
TOPICAL_OINTMENT | CUTANEOUS | Status: AC
  Filled 2016-02-04: qty 28.35

## 2016-02-04 MED ORDER — GLYCOPYRROLATE 0.2 MG/ML IJ SOLN: 0.2000 mg | Freq: Once | INTRAMUSCULAR | Status: DC | PRN

## 2016-02-04 MED ORDER — DEXAMETHASONE SODIUM PHOSPHATE 10 MG/ML IJ SOLN
INTRAMUSCULAR | Status: AC
  Filled 2016-02-04: qty 1

## 2016-02-04 MED ORDER — PHENYLEPHRINE HCL 10 MG/ML IJ SOLN
INTRAMUSCULAR | Status: DC | PRN
  Administered 2016-02-04 (×2): 80 ug via INTRAVENOUS

## 2016-02-04 MED ORDER — SUCCINYLCHOLINE CHLORIDE 20 MG/ML IJ SOLN
INTRAMUSCULAR | Status: DC | PRN
  Administered 2016-02-04: 50 mg via INTRAVENOUS

## 2016-02-04 MED ORDER — SUCCINYLCHOLINE CHLORIDE 200 MG/10ML IV SOSY
PREFILLED_SYRINGE | INTRAVENOUS | Status: AC
  Filled 2016-02-04: qty 10

## 2016-02-04 MED ORDER — ONDANSETRON HCL 4 MG/2ML IJ SOLN
INTRAMUSCULAR | Status: DC | PRN
  Administered 2016-02-04: 4 mg via INTRAVENOUS

## 2016-02-04 MED ORDER — EPHEDRINE SULFATE 50 MG/ML IJ SOLN
INTRAMUSCULAR | Status: DC | PRN
  Administered 2016-02-04: 10 mg via INTRAVENOUS

## 2016-02-04 MED ORDER — DEXAMETHASONE SODIUM PHOSPHATE 4 MG/ML IJ SOLN
INTRAMUSCULAR | Status: DC | PRN
  Administered 2016-02-04: 10 mg via INTRAVENOUS

## 2016-02-04 MED ORDER — MIDAZOLAM HCL 2 MG/2ML IJ SOLN: 1.0000 mg | INTRAMUSCULAR | Status: DC | PRN

## 2016-02-04 MED ORDER — EPINEPHRINE 30 MG/30ML IJ SOLN
INTRAMUSCULAR | Status: AC
  Filled 2016-02-04: qty 1

## 2016-02-04 SURGICAL SUPPLY — 73 items
BENZOIN TINCTURE PRP APPL 2/3 (GAUZE/BANDAGES/DRESSINGS) IMPLANT
BLADE SURG 15 STRL LF DISP TIS (BLADE) ×2 IMPLANT
BLADE SURG 15 STRL SS (BLADE) ×2
CANISTER SUCT 1200ML W/VALVE (MISCELLANEOUS) ×4 IMPLANT
CLEANER CAUTERY TIP 5X5 PAD (MISCELLANEOUS) IMPLANT
CLOSURE WOUND 1/2 X4 (GAUZE/BANDAGES/DRESSINGS)
CLOSURE WOUND 1/4X4 (GAUZE/BANDAGES/DRESSINGS)
CONT SPEC 4OZ CLIKSEAL STRL BL (MISCELLANEOUS) IMPLANT
CORDS BIPOLAR (ELECTRODE) ×4 IMPLANT
COVER BACK TABLE 60X90IN (DRAPES) ×4 IMPLANT
COVER MAYO STAND STRL (DRAPES) ×4 IMPLANT
DECANTER SPIKE VIAL GLASS SM (MISCELLANEOUS) ×4 IMPLANT
DERMABOND ADVANCED (GAUZE/BANDAGES/DRESSINGS) ×2
DERMABOND ADVANCED .7 DNX12 (GAUZE/BANDAGES/DRESSINGS) ×2 IMPLANT
DRAPE U-SHAPE 76X120 STRL (DRAPES) ×4 IMPLANT
ELECT COATED BLADE 2.86 ST (ELECTRODE) ×4 IMPLANT
ELECT REM PT RETURN 9FT ADLT (ELECTROSURGICAL) ×4
ELECTRODE REM PT RTRN 9FT ADLT (ELECTROSURGICAL) ×2 IMPLANT
GAUZE SPONGE 4X4 16PLY XRAY LF (GAUZE/BANDAGES/DRESSINGS) IMPLANT
GLOVE BIO SURGEON STRL SZ7 (GLOVE) ×4 IMPLANT
GLOVE BIOGEL PI IND STRL 7.0 (GLOVE) ×2 IMPLANT
GLOVE BIOGEL PI INDICATOR 7.0 (GLOVE) ×2
GLOVE ECLIPSE 6.5 STRL STRAW (GLOVE) ×4 IMPLANT
GLOVE SS BIOGEL STRL SZ 7.5 (GLOVE) ×4 IMPLANT
GLOVE SUPERSENSE BIOGEL SZ 7.5 (GLOVE) ×4
GOWN STRL REUS W/ TWL LRG LVL3 (GOWN DISPOSABLE) ×2 IMPLANT
GOWN STRL REUS W/ TWL XL LVL3 (GOWN DISPOSABLE) ×2 IMPLANT
GOWN STRL REUS W/TWL LRG LVL3 (GOWN DISPOSABLE) ×2
GOWN STRL REUS W/TWL XL LVL3 (GOWN DISPOSABLE) ×2
GUARD TEETH (MISCELLANEOUS) ×4 IMPLANT
HEMOSTAT SURGICEL .5X2 ABSORB (HEMOSTASIS) IMPLANT
MARKER SKIN DUAL TIP RULER LAB (MISCELLANEOUS) IMPLANT
NDL SAFETY ECLIPSE 18X1.5 (NEEDLE) ×2 IMPLANT
NEEDLE HYPO 18GX1.5 SHARP (NEEDLE) ×2
NEEDLE HYPO 25X1 1.5 SAFETY (NEEDLE) ×4 IMPLANT
NEEDLE SPNL 22GX7 QUINCKE BK (NEEDLE) IMPLANT
NS IRRIG 1000ML POUR BTL (IV SOLUTION) ×8 IMPLANT
PACK BASIN DAY SURGERY FS (CUSTOM PROCEDURE TRAY) ×4 IMPLANT
PAD CLEANER CAUTERY TIP 5X5 (MISCELLANEOUS)
PATTIES SURGICAL .5 X3 (DISPOSABLE) ×4 IMPLANT
PENCIL BUTTON HOLSTER BLD 10FT (ELECTRODE) ×4 IMPLANT
SHEET MEDIUM DRAPE 40X70 STRL (DRAPES) ×4 IMPLANT
SLEEVE SCD COMPRESS KNEE MED (MISCELLANEOUS) ×4 IMPLANT
SOLUTION ANTI FOG 6CC (MISCELLANEOUS) IMPLANT
SOLUTION BUTLER CLEAR DIP (MISCELLANEOUS) ×4 IMPLANT
SPONGE GAUZE 4X4 12PLY STER LF (GAUZE/BANDAGES/DRESSINGS) ×8 IMPLANT
SPONGE INTESTINAL PEANUT (DISPOSABLE) IMPLANT
STRIP CLOSURE SKIN 1/2X4 (GAUZE/BANDAGES/DRESSINGS) IMPLANT
STRIP CLOSURE SKIN 1/4X4 (GAUZE/BANDAGES/DRESSINGS) IMPLANT
SUCTION FRAZIER HANDLE 10FR (MISCELLANEOUS)
SUCTION TUBE FRAZIER 10FR DISP (MISCELLANEOUS) IMPLANT
SURGILUBE 2OZ TUBE FLIPTOP (MISCELLANEOUS) IMPLANT
SUT CHROMIC 3 0 PS 2 (SUTURE) ×4 IMPLANT
SUT CHROMIC 3 0 SH 27 (SUTURE) IMPLANT
SUT ETHILON 4 0 PS 2 18 (SUTURE) IMPLANT
SUT ETHILON 5 0 P 3 18 (SUTURE) ×2
SUT NYLON ETHILON 5-0 P-3 1X18 (SUTURE) ×2 IMPLANT
SUT SILK 2 0 TIES 17X18 (SUTURE)
SUT SILK 2-0 18XBRD TIE BLK (SUTURE) IMPLANT
SUT SILK 3 0 SH 30 (SUTURE) IMPLANT
SUT SILK 3 0 TIES 17X18 (SUTURE) ×2
SUT SILK 3-0 18XBRD TIE BLK (SUTURE) ×2 IMPLANT
SUT VIC AB 5-0 P-3 18X BRD (SUTURE) ×2 IMPLANT
SUT VIC AB 5-0 P3 18 (SUTURE) ×2
SWAB COLLECTION DEVICE MRSA (MISCELLANEOUS) IMPLANT
SWAB CULTURE ESWAB REG 1ML (MISCELLANEOUS) IMPLANT
SYR 5ML LL (SYRINGE) ×4 IMPLANT
SYR BULB 3OZ (MISCELLANEOUS) ×4 IMPLANT
SYR CONTROL 10ML LL (SYRINGE) ×4 IMPLANT
TOWEL OR 17X24 6PK STRL BLUE (TOWEL DISPOSABLE) ×12 IMPLANT
TRAY DSU PREP LF (CUSTOM PROCEDURE TRAY) ×4 IMPLANT
TUBE CONNECTING 20'X1/4 (TUBING) ×2
TUBE CONNECTING 20X1/4 (TUBING) ×6 IMPLANT

## 2016-02-04 NOTE — Anesthesia Postprocedure Evaluation (Signed)
Anesthesia Post Note  Patient: Renee Hawkins  Procedure(s) Performed: Procedure(s) (LRB): EXCISIONAL BIOPSY RIGHT NECK LYMPH NODE (Right) DIRECT LARYNGOSCOPY (Right)  Patient location during evaluation: PACU Anesthesia Type: General Level of consciousness: awake and alert Pain management: pain level controlled Vital Signs Assessment: post-procedure vital signs reviewed and stable Respiratory status: spontaneous breathing, nonlabored ventilation, respiratory function stable and patient connected to nasal cannula oxygen Cardiovascular status: blood pressure returned to baseline and stable Postop Assessment: no signs of nausea or vomiting Anesthetic complications: no    Last Vitals:  Vitals:   02/04/16 0936 02/04/16 0948  BP:  119/65  Pulse: 81 87  Resp: 18 16  Temp:  36.7 C    Last Pain:  Vitals:   02/04/16 0930  TempSrc:   PainSc: 0-No pain                 Catalina Gravel

## 2016-02-04 NOTE — Anesthesia Preprocedure Evaluation (Addendum)
Anesthesia Evaluation  Patient identified by MRN, date of birth, ID band Patient awake    Reviewed: Allergy & Precautions, NPO status , Patient's Chart, lab work & pertinent test results  Airway Mallampati: II  TM Distance: >3 FB Neck ROM: Full    Dental  (+) Teeth Intact, Dental Advisory Given, Caps   Pulmonary sleep apnea and Continuous Positive Airway Pressure Ventilation , COPD,  COPD inhaler, former smoker,    Pulmonary exam normal breath sounds clear to auscultation       Cardiovascular hypertension, Pt. on medications Normal cardiovascular exam Rhythm:Regular Rate:Normal     Neuro/Psych PSYCHIATRIC DISORDERS Anxiety  Neuromuscular disease    GI/Hepatic Neg liver ROS, GERD  Medicated,  Endo/Other  negative endocrine ROS  Renal/GU Renal InsufficiencyRenal disease     Musculoskeletal negative musculoskeletal ROS (+)   Abdominal   Peds  Hematology negative hematology ROS (+)   Anesthesia Other Findings Day of surgery medications reviewed with the patient.  Reproductive/Obstetrics                            Anesthesia Physical Anesthesia Plan  ASA: III  Anesthesia Plan: General   Post-op Pain Management:    Induction: Intravenous  Airway Management Planned: Oral ETT  Additional Equipment:   Intra-op Plan:   Post-operative Plan: Extubation in OR  Informed Consent: I have reviewed the patients History and Physical, chart, labs and discussed the procedure including the risks, benefits and alternatives for the proposed anesthesia with the patient or authorized representative who has indicated his/her understanding and acceptance.   Dental advisory given  Plan Discussed with: CRNA  Anesthesia Plan Comments: (Risks/benefits of general anesthesia discussed with patient including risk of damage to teeth, lips, gum, and tongue, nausea/vomiting, allergic reactions to medications, and  the possibility of heart attack, stroke and death.  All patient questions answered.  Patient wishes to proceed.)        Anesthesia Quick Evaluation

## 2016-02-04 NOTE — Discharge Instructions (Addendum)
Keep incision site dry for 24 hrs.  Tylenol or motrin prn pain Can apply cool compress to biopsy site to reduce swelling Return to see Dr Lucia Gaskins Friday at 4:20  Call your surgeon if you experience:   1.  Fever over 101.0. 2.  Inability to urinate. 3.  Nausea and/or vomiting. 4.  Extreme swelling or bruising at the surgical site. 5.  Continued bleeding from the incision. 6.  Increased pain, redness or drainage from the incision. 7.  Problems related to your pain medication. 8.  Any problems and/or concerns Post Anesthesia Home Care Instructions  Activity: Get plenty of rest for the remainder of the day. A responsible adult should stay with you for 24 hours following the procedure.  For the next 24 hours, DO NOT: -Drive a car -Paediatric nurse -Drink alcoholic beverages -Take any medication unless instructed by your physician -Make any legal decisions or sign important papers.  Meals: Start with liquid foods such as gelatin or soup. Progress to regular foods as tolerated. Avoid greasy, spicy, heavy foods. If nausea and/or vomiting occur, drink only clear liquids until the nausea and/or vomiting subsides. Call your physician if vomiting continues.  Special Instructions/Symptoms: Your throat may feel dry or sore from the anesthesia or the breathing tube placed in your throat during surgery. If this causes discomfort, gargle with warm salt water. The discomfort should disappear within 24 hours.  If you had a scopolamine patch placed behind your ear for the management of post- operative nausea and/or vomiting:  1. The medication in the patch is effective for 72 hours, after which it should be removed.  Wrap patch in a tissue and discard in the trash. Wash hands thoroughly with soap and water. 2. You may remove the patch earlier than 72 hours if you experience unpleasant side effects which may include dry mouth, dizziness or visual disturbances. 3. Avoid touching the patch. Wash your  hands with soap and water after contact with the patch.

## 2016-02-04 NOTE — Brief Op Note (Signed)
02/04/2016  8:56 AM  PATIENT:  Renee Hawkins  75 y.o. female  PRE-OPERATIVE DIAGNOSIS:  submandibular adenopathy, localized enlarged lymph node  POST-OPERATIVE DIAGNOSIS:  submandibular adenopathy, localized enlarged lymph node  PROCEDURE:  Procedure(s): EXCISIONAL BIOPSY RIGHT NECK LYMPH NODE (Right) DIRECT LARYNGOSCOPY (Right)  SURGEON:  Surgeon(s) and Role:    * Rozetta Nunnery, MD - Primary  PHYSICIAN ASSISTANT:   ASSISTANTS: none   ANESTHESIA:   general  EBL:  Total I/O In: 1500 [I.V.:1500] Out: 7 [Blood:7]  BLOOD ADMINISTERED:none  DRAINS: none   LOCAL MEDICATIONS USED:  XYLOCAINE with EPI 2 cc  SPECIMEN:  Source of Specimen:  left neck  DISPOSITION OF SPECIMEN:  PATHOLOGY  COUNTS:  YES  TOURNIQUET:  * No tourniquets in log *  DICTATION: .Other Dictation: Dictation Number [id number]  PLAN OF CARE: Discharge to home after PACU  PATIENT DISPOSITION:  PACU - hemodynamically stable.   Delay start of Pharmacological VTE agent (>24hrs) due to surgical blood loss or risk of bleeding: yes

## 2016-02-04 NOTE — Transfer of Care (Signed)
Immediate Anesthesia Transfer of Care Note  Patient: AMARIS DECHRISTOPHER  Procedure(s) Performed: Procedure(s): EXCISIONAL BIOPSY RIGHT NECK LYMPH NODE (Right) DIRECT LARYNGOSCOPY (Right)  Patient Location: PACU  Anesthesia Type:General  Level of Consciousness: awake and patient cooperative  Airway & Oxygen Therapy: Patient Spontanous Breathing and Patient connected to face mask oxygen  Post-op Assessment: Report given to RN and Post -op Vital signs reviewed and stable  Post vital signs: Reviewed and stable  Last Vitals:  Vitals:   02/04/16 0634 02/04/16 0903  BP: (!) 148/78 122/61  Pulse: 89 86  Resp: 18 (!) 9  Temp: 36.8 C     Last Pain:  Vitals:   02/04/16 0634  TempSrc: Oral  PainSc: 0-No pain      Patients Stated Pain Goal: 0 ([id number])  Complications: No apparent anesthesia complications

## 2016-02-04 NOTE — Anesthesia Procedure Notes (Signed)
Procedure Name: Intubation Date/Time: 02/04/2016 7:38 AM Performed by: Marrianne Mood Pre-anesthesia Checklist: Patient identified, Emergency Drugs available, Suction available, Patient being monitored and Timeout performed Patient Re-evaluated:Patient Re-evaluated prior to inductionOxygen Delivery Method: Circle system utilized Preoxygenation: Pre-oxygenation with 100% oxygen Intubation Type: IV induction Ventilation: Mask ventilation without difficulty Laryngoscope Size: Mac, Glidescope and 3 Tube type: Oral Tube size: 6.0 mm Number of attempts: 1 Airway Equipment and Method: Stylet and Oral airway Placement Confirmation: ETT inserted through vocal cords under direct vision,  positive ETCO2 and breath sounds checked- equal and bilateral Secured at: 20 cm Tube secured with: Tape Dental Injury: Teeth and Oropharynx as per pre-operative assessment  Difficulty Due To: Difficult Airway- due to reduced neck mobility, Difficult Airway- due to limited oral opening and Difficult Airway- due to dentition

## 2016-02-04 NOTE — Interval H&P Note (Signed)
History and Physical Interval Note:  02/04/2016 7:27 AM  Renee Hawkins  has presented today for surgery, with the diagnosis of submandibular adenopathy, localized enlarged lymph node  The various methods of treatment have been discussed with the patient and family. After consideration of risks, benefits and other options for treatment, the patient has consented to  Procedure(s): EXCISIONAL BIOPSY RIGHT NECK LYMPH NODE (Right) DIRECT LARYNGOSCOPY (Right) as a surgical intervention .  The patient's history has been reviewed, patient examined, no change in status, stable for surgery.  I have reviewed the patient's chart and labs.  Questions were answered to the patient's satisfaction.     [name]

## 2016-02-05 ENCOUNTER — Encounter (HOSPITAL_BASED_OUTPATIENT_CLINIC_OR_DEPARTMENT_OTHER): Payer: Self-pay | Admitting: Otolaryngology

## 2016-02-05 NOTE — Op Note (Signed)
NAME:  Renee Hawkins, Renee Hawkins NO.:  [id number]  MEDICAL RECORD NO.:  [id number]  LOCATION:                                 FACILITY:  PHYSICIAN:  Leonides Sake. Lucia Gaskins, M.D.DATE OF BIRTH:  [birth year]  DATE OF PROCEDURE:  02/04/2016 DATE OF DISCHARGE:                              OPERATIVE REPORT   PREOPERATIVE DIAGNOSIS:  Right neck lymphadenopathy.  POSTOPERATIVE DIAGNOSIS:  Right neck lymphadenopathy.  OPERATION PERFORMED:  Direct laryngoscopy with excisional biopsy of right neck submandibular lymph node.  SURGEON:  Leonides Sake. Lucia Gaskins, M.D.  ANESTHESIA:  General endotracheal.  COMPLICATIONS:  None.  BRIEF CLINICAL NOTE:  Renee Hawkins is a 75 year old female who has had an enlarged right submandibular mass now for 6-7 months.  It has not been painful.  On exam, she has a couple of enlarged lymph nodes adjacent to the right submandibular gland.  Oral and upper airway exam in the office were normal.  CT scan showed right neck lymphadenopathy suspicious for metastatic disease, but no evidence of primary lesion in the upper airway.  She was taken to the operating room at this time for direct laryngoscopy and excisional biopsy of right neck node.  DESCRIPTION OF PROCEDURE:  After adequate endotracheal anesthesia, direct laryngoscopy was performed initially.  Oral exam revealed normal- appearing tongue, floor of mouth, buccal mucosa.  Palpation of the base of tongue was soft, vallecular area, no palpable masses.  Tonsil regions, no palpable masses.  Direct laryngoscopy was performed.  Again, the base of tongue, vallecula, epiglottis were normal in appearance. Tonsil regions appeared normal.  AE folds, epiglottis and vocal cords were clear and piriform sinuses were clear.  Following direct laryngoscopy, the right neck was prepped with Betadine solution.  The area of the lymph node was marked out and injected with 2-3 mL of Xylocaine with epinephrine for  hemostasis.  A horizontal incision was made directly over the lower more posterior right submandibular nodule. There was a slightly larger nodule just anterior and superior to this in the region of the marginal nerve and was elected to perform excisional biopsy on the lower nodule.  Horizontal incision was made and dissection was carried down through the subcutaneous tissue to the platysmal muscle, which was divided with scissors, but the nodule was just deep to the platysmal muscle.  Superior aspect of the nodule and the branches of the marginal mandibular nerve were identified and were preserved throughout the dissection.  The lymph node was easily dissected out, measured about 1.5 cm in size and was sent in saline fresh to Pathology for lymphoma workup.  There was another lymph node just anterior and little bit more superior, but this was not removed as this was in the region of the marginal mandibular nerve.  Hemostasis was obtained with bipolar cautery.  Defect was then closed with 3-0 chromic sutures subcutaneously and through the platysmal muscle, then 5-0 Vicryl subcuticular stitch followed by Dermabond.  Renee Hawkins was awakened from anesthesia and transferred to the recovery room, postop doing well.  DISPOSITION:  Renee Hawkins was discharged home later this morning on Tylenol and Motrin p.r.n. pain.  We will have her follow up in my office in 3  days to review pathology.    ______________________________ Leonides Sake Lucia Gaskins, M.D.   ______________________________ Leonides Sake. Lucia Gaskins, M.D.    CEN/MEDQ  D:  02/04/2016  T:  02/05/2016  Job:  [id number]  cc:   Unk Pinto, M.D.

## 2016-02-10 ENCOUNTER — Telehealth: Payer: Self-pay | Admitting: *Deleted

## 2016-02-10 NOTE — Telephone Encounter (Signed)
Received new patient referral from Dr. Alvy Bimler. High Grade Lymphoma. Appointment confirmed for 11/7 3pm per Dr. Alvy Bimler.  Discussed appt with patient. She is very nervous about a cancer diagnosis due to her husbands diagnosis and treatment at [facility] last year. He has since passed away. She is still tearful from his passing.   Pt agrees to come to [facility] to see Dr. Alvy Bimler since Dr. Lucia Gaskins highly encouraged her and has already briefed her on the diagnosis.   Updated demographics. Pt understands to bring insurance card and medication list to appt tomorrow.

## 2016-02-11 ENCOUNTER — Ambulatory Visit (HOSPITAL_BASED_OUTPATIENT_CLINIC_OR_DEPARTMENT_OTHER): Payer: Medicare Other | Admitting: Hematology and Oncology

## 2016-02-11 ENCOUNTER — Telehealth: Payer: Self-pay | Admitting: Hematology and Oncology

## 2016-02-11 ENCOUNTER — Encounter: Payer: Self-pay | Admitting: Hematology and Oncology

## 2016-02-11 DIAGNOSIS — Z5111 Encounter for antineoplastic chemotherapy: Secondary | ICD-10-CM

## 2016-02-11 DIAGNOSIS — C8231 Follicular lymphoma grade IIIa, lymph nodes of head, face, and neck: Secondary | ICD-10-CM | POA: Diagnosis not present

## 2016-02-11 DIAGNOSIS — R0600 Dyspnea, unspecified: Secondary | ICD-10-CM

## 2016-02-11 NOTE — Telephone Encounter (Signed)
GAVE PATIENT AVS REPORT AND APPOINTMENT FOR November. ECHO TO [facility] FOR [place]. [facility] WILL CALL RE SCAN.

## 2016-02-12 NOTE — Assessment & Plan Note (Addendum)
I reviewed the guidelines with the patient We will order a staging scans with PET CT, echocardiogram for baseline cardiac evaluation, chemotherapy education class and I will see her back to review test results. Depending on results of the imaging study, she may or may not need bone marrow aspirate and biopsy I will order blood work in the future Due to high grade disease, she would likely require chemotherapy with R-CHOP regimen

## 2016-02-12 NOTE — Progress Notes (Signed)
[place] NOTE  Patient Care Team: Unk Pinto, MD as PCP - General (Internal Medicine) Lafayette Dragon, MD (Inactive) as Consulting Physician (Gastroenterology) Loletha Carrow, MD (Pulmonary Disease) Calvert Cantor, MD as Consulting Physician (Ophthalmology)  CHIEF COMPLAINTS/PURPOSE OF CONSULTATION:  Newly diagnosed lymphoma  HISTORY OF PRESENTING ILLNESS:  Renee Hawkins 75 y.o. female is here because of recent diagnosis of lymphoma. The patient has palpated a lump on the right side of the neck for over 10 months. She had no symptoms. She was evaluated by her primary doctor who noted abnormal lymphadenopathy and was recommended to proceed with further evaluation. Summary of oncologic history as follows:   Follicular lymphoma grade iiia, lymph nodes of head, face, and neck ([place])   01/29/2016 Imaging    Ct neck showed right submandibular lymphadenopathy and asymmetric prominence of jugular chain nodes. Metastatic disease is the primary diagnostic concern and submandibular node sampling is recommended. No primary lesion is seen, limited at the oral cavity due to dental artifact. 2. Emphysema      02/04/2016 Pathology Results    Accession: [id number] Biopsy showed high grade follicular lymphoma      She is doing well since her recent surgery The patient have chronic peripheral neuropathy of unknown etiology She has back history of COPD and uses C Pap She denies recent infection She is active in all activities of daily living and denies recent anorexia, abnormal weight loss or night sweats  MEDICAL HISTORY:  Past Medical History:  Diagnosis Date  . Anxiety   . COPD (chronic obstructive pulmonary disease) ([place])   . Diverticulosis   . GERD (gastroesophageal reflux disease)   . Hx of adenomatous colonic polyps   . Hyperlipidemia   . Hypertension   . Hypomagnesemia   . Neuropathy ([place])   . Peripheral neuropathy ([place])   . Prediabetes   . RLS (restless  legs syndrome)   . Sleep apnea    CPAP  . Vitamin B12 deficiency   . Vitamin D deficiency     SURGICAL HISTORY: Past Surgical History:  Procedure Laterality Date  . APPENDECTOMY    . COLONOSCOPY N/A 05/18/2014   Procedure: COLONOSCOPY;  Surgeon: Lafayette Dragon, MD;  Location: [facility];  Service: Endoscopy;  Laterality: N/A;  . DIRECT LARYNGOSCOPY Right 02/04/2016   Procedure: DIRECT LARYNGOSCOPY;  Surgeon: Rozetta Nunnery, MD;  Location: [place];  Service: ENT;  Laterality: Right;  . EYE SURGERY  2017   Bilateral cataract Dr. Bing Plume  . LYMPH NODE BIOPSY Right 02/04/2016   Procedure: EXCISIONAL BIOPSY RIGHT NECK LYMPH NODE;  Surgeon: Rozetta Nunnery, MD;  Location: [place];  Service: ENT;  Laterality: Right;  . TONSILLECTOMY      SOCIAL HISTORY: Social History   Social History  . Marital status: Widowed    Spouse name: N/A  . Number of children: 0  . Years of education: N/A   Occupational History  . Retired  Retired   Social History Main Topics  . Smoking status: Former Smoker    Packs/day: 1.00    Years: 30.00    Types: Cigarettes    Quit date: 04/06/2012  . Smokeless tobacco: Never Used  . Alcohol use No  . Drug use: No  . Sexual activity: Not Currently   Other Topics Concern  . Not on file   Social History Narrative   Daily caffeine     FAMILY HISTORY: Family History  Problem Relation Age  of Onset  . Stroke Mother   . Stroke Maternal Grandmother   . Colon cancer Neg Hx     ALLERGIES:  has No Known Allergies.  MEDICATIONS:  Current Outpatient Prescriptions  Medication Sig Dispense Refill  . alendronate (FOSAMAX) 70 MG tablet Take 1 tablet weekly with a full glass of water for 30 minutes on an empty stomach 12 tablet 3  . atorvastatin (LIPITOR) 80 MG tablet TAKE ONE TABLET BY MOUTH ONCE DAILY FOR CHOLESTEROL 30 tablet 3  . Cholecalciferol 4000 UNITS CAPS Take 1 capsule by mouth daily.    . Ferrous Sulfate  Dried (SLOW RELEASE IRON) 45 MG TBCR Take by mouth daily.    Marland Kitchen gabapentin (NEURONTIN) 300 MG capsule Take 1 capsule 3 to 4 x/day for pain. 360 capsule 1  . Magnesium 300 MG CAPS Take 1 capsule by mouth 3 (three) times daily.     Marland Kitchen omeprazole (PRILOSEC) 40 MG capsule Take 1 capsule daily for acid reflux 90 capsule 1  . potassium chloride (MICRO-K) 10 MEQ CR capsule TAKE TWO CAPSULES BY MOUTH ONCE DAILY 180 capsule 1  . tiotropium (SPIRIVA) 18 MCG inhalation capsule Place 18 mcg into inhaler and inhale daily.    . verapamil (CALAN-SR) 240 MG CR tablet TAKE ONE TABLET BY MOUTH ONCE DAILY AFTER A MEAL FOR BLOOD PRESSURE 90 tablet 0   No current facility-administered medications for this visit.     REVIEW OF SYSTEMS:   Constitutional: Denies fevers, chills or abnormal night sweats Eyes: Denies blurriness of vision, double vision or watery eyes Ears, nose, mouth, throat, and face: Denies mucositis or sore throat Respiratory: Denies cough, dyspnea or wheezes Cardiovascular: Denies palpitation, chest discomfort or lower extremity swelling Gastrointestinal:  Denies nausea, heartburn or change in bowel habits Skin: Denies abnormal skin rashes Neurological:Denies numbness, tingling or new weaknesses Behavioral/Psych: Mood is stable, no new changes  All other systems were reviewed with the patient and are negative.  PHYSICAL EXAMINATION: ECOG PERFORMANCE STATUS: 0  Vitals:   02/11/16 1445  BP: 140/69  Pulse: (!) 102  Resp: 18  Temp: 98.1 F (36.7 C)   Filed Weights   02/11/16 1445  Weight: 128 lb 4.8 oz (58.2 kg)    GENERAL:alert, no distress and comfortable SKIN: skin color, texture, turgor are normal, no rashes or significant lesions EYES: normal, conjunctiva are pink and non-injected, sclera clear OROPHARYNX:no exudate, no erythema and lips, buccal mucosa, and tongue normal  NECK: supple, thyroid normal size, non-tender, without nodularity. Well-healed surgical scar LYMPH:  no  palpable lymphadenopathy in the cervical, axillary or inguinal LUNGS: clear to auscultation and percussion with normal breathing effort HEART: regular rate & rhythm and no murmurs and no lower extremity edema ABDOMEN:abdomen soft, non-tender and normal bowel sounds Musculoskeletal:no cyanosis of digits and no clubbing  PSYCH: alert & oriented x 3 with fluent speech NEURO: no focal motor/sensory deficits  LABORATORY DATA:  I have reviewed the data as listed Lab Results  Component Value Date   WBC 6.6 01/23/2016   HGB 13.1 01/23/2016   HCT 39.2 01/23/2016   MCV 92.7 01/23/2016   PLT 216 01/23/2016    Recent Labs  07/24/15 1620 10/23/15 1704 01/23/16 1436  NA 140 140 142  K 4.1 4.4 4.0  CL 102 102 100  CO2 26 27 28   GLUCOSE 82 77 79  BUN 13 10 12   CREATININE 0.99* 1.04* 1.04*  CALCIUM 9.5 9.5 9.4  GFRNONAA 57* 53* 53*  GFRAA 65 62 62  PROT 7.0 7.0 6.8  ALBUMIN 4.2 4.3 4.2  AST 16 17 21   ALT 14 16 19   ALKPHOS 68 61 53  BILITOT 0.4 0.4 0.4  BILIDIR 0.1 0.1 0.1  IBILI 0.3 0.3 0.3    RADIOGRAPHIC STUDIES: I have personally reviewed the radiological images as listed and agreed with the findings in the report. Ct Soft Tissue Neck W Contrast  Result Date: 01/29/2016 CLINICAL DATA:  Right-sided adenopathy for several months. EXAM: CT NECK WITH CONTRAST TECHNIQUE: Multidetector CT imaging of the neck was performed using the standard protocol following the bolus administration of intravenous contrast. CONTRAST:  58mL ISOVUE-300 IOPAMIDOL (ISOVUE-300) INJECTION 61% COMPARISON:  None. FINDINGS: Pharynx and larynx: Limited at the level of the oropharynx. No evidence of mass lesion. Salivary glands: The right submandibular gland contacts two right submandibular masses, but appear distinct. No mass or inflammatory changes. Thyroid: Negative Lymph nodes: 2 enlarged right submandibular gland lymph nodes without cavitation. The rounded nodes measure up to 18 mm in diameter. Upper right  cervical chain lymph nodes are larger than on the right, including around rounded 9 mm right jugulodigastric node. Vascular: Atherosclerotic calcification, prominent along the dominant left vertebral artery. No acute finding Limited intracranial: Negative Visualized orbits: Minimally seen but negative Mastoids and visualized paranasal sinuses: Clear Skeleton: No acute or aggressive finding. Degenerative changes with moderate canal stenosis at C5-6. Upper chest: Centrilobular and panlobular emphysema. IMPRESSION: 1. Right submandibular lymphadenopathy and asymmetric prominence of jugular chain nodes. Metastatic disease is the primary diagnostic concern and submandibular node sampling is recommended. No primary lesion is seen, limited at the oral cavity due to dental artifact. 2. Emphysema Electronically Signed   By: Monte Fantasia M.D.   On: 01/29/2016 10:25    ASSESSMENT & PLAN:  Follicular lymphoma grade iiia, lymph nodes of head, face, and neck ([place]) I reviewed the guidelines with the patient We will order a staging scans with PET CT, echocardiogram for baseline cardiac evaluation, chemotherapy education class and I will see her back to review test results. Depending on results of the imaging study, she may or may not need bone marrow aspirate and biopsy I will order blood work in the future Due to high grade disease, she would likely require chemotherapy with R-CHOP regimen  Orders Placed This Encounter  Procedures  . NM PET Image Initial (PI) Skull Base To Thigh    Standing Status:   Future    Standing Expiration Date:   05/13/2017    Order Specific Question:   Reason for exam:    Answer:   staging lymphoma    Order Specific Question:   Preferred imaging location?    Answer:   [facility]  . ECHOCARDIOGRAM LIMITED    Standing Status:   Future    Standing Expiration Date:   05/13/2017    Order Specific Question:   Where should this test be performed    Answer:   [facility]    Order  Specific Question:   Complete or Limited study?    Answer:   Limited    Order Specific Question:   Does the patient have a known history of hypersensitivity to Perflutren (Chief Technology Officer for echocardiograms - CHECK ALLERGIES)    Answer:   No    Order Specific Question:   ADMINISTER PERFLUTERN    Answer:   ADMINISTER PERFLUTREN    Comments:   no need contrast    Order Specific Question:   Expected Date:  Answer:   1 week    Order Specific Question:   Reason for exam-Echo    Answer:   Chemo  V67.2 / Z09     All questions were answered. The patient knows to call the clinic with any problems, questions or concerns. I spent 40 minutes counseling the patient face to face. The total time spent in the appointment was 55 minutes and more than 50% was on counseling.     Heath Lark, MD 02/12/2016 10:34 AM

## 2016-02-17 ENCOUNTER — Other Ambulatory Visit: Payer: Self-pay | Admitting: Internal Medicine

## 2016-02-18 DIAGNOSIS — M9904 Segmental and somatic dysfunction of sacral region: Secondary | ICD-10-CM | POA: Diagnosis not present

## 2016-02-18 DIAGNOSIS — M5136 Other intervertebral disc degeneration, lumbar region: Secondary | ICD-10-CM | POA: Diagnosis not present

## 2016-02-18 NOTE — Telephone Encounter (Signed)
PER MANAGED CARE [facility] FOR ECHO. ECHO ALREADY ON SCHEDULE.

## 2016-02-21 DIAGNOSIS — G4733 Obstructive sleep apnea (adult) (pediatric): Secondary | ICD-10-CM | POA: Diagnosis not present

## 2016-02-21 DIAGNOSIS — Z9989 Dependence on other enabling machines and devices: Secondary | ICD-10-CM | POA: Diagnosis not present

## 2016-02-21 DIAGNOSIS — J449 Chronic obstructive pulmonary disease, unspecified: Secondary | ICD-10-CM | POA: Diagnosis not present

## 2016-02-24 ENCOUNTER — Ambulatory Visit (HOSPITAL_COMMUNITY): Payer: Medicare Other

## 2016-02-24 ENCOUNTER — Other Ambulatory Visit: Payer: Self-pay | Admitting: Hematology and Oncology

## 2016-02-24 ENCOUNTER — Encounter (HOSPITAL_COMMUNITY)
Admission: RE | Admit: 2016-02-24 | Discharge: 2016-02-24 | Disposition: A | Discharge status: Home / Self Care | Payer: Medicare Other | Source: Ambulatory Visit | Attending: Hematology and Oncology | Admitting: Hematology and Oncology

## 2016-02-24 ENCOUNTER — Ambulatory Visit (HOSPITAL_BASED_OUTPATIENT_CLINIC_OR_DEPARTMENT_OTHER)
Admission: RE | Admit: 2016-02-24 | Discharge: 2016-02-24 | Disposition: A | Discharge status: Home / Self Care | Payer: Medicare Other | Source: Ambulatory Visit | Attending: Hematology and Oncology | Admitting: Hematology and Oncology

## 2016-02-24 DIAGNOSIS — R0600 Dyspnea, unspecified: Secondary | ICD-10-CM

## 2016-02-24 DIAGNOSIS — C8231 Follicular lymphoma grade IIIa, lymph nodes of head, face, and neck: Secondary | ICD-10-CM | POA: Diagnosis not present

## 2016-02-24 DIAGNOSIS — Z5111 Encounter for antineoplastic chemotherapy: Secondary | ICD-10-CM | POA: Diagnosis not present

## 2016-02-24 DIAGNOSIS — I5189 Other ill-defined heart diseases: Secondary | ICD-10-CM | POA: Insufficient documentation

## 2016-02-24 DIAGNOSIS — C8290 Follicular lymphoma, unspecified, unspecified site: Secondary | ICD-10-CM | POA: Diagnosis not present

## 2016-02-24 LAB — GLUCOSE, CAPILLARY: GLUCOSE-CAPILLARY: 98 mg/dL (ref 65–99)

## 2016-02-24 MED ORDER — FLUDEOXYGLUCOSE F - 18 (FDG) INJECTION
6.9000 | Freq: Once | INTRAVENOUS | Status: AC | PRN
  Administered 2016-02-24: 6.9 via INTRAVENOUS

## 2016-02-24 NOTE — Progress Notes (Signed)
  Echocardiogram 2D Echocardiogram has been performed.  Renee Hawkins 02/24/2016, 9:24 AM

## 2016-02-25 ENCOUNTER — Ambulatory Visit (HOSPITAL_BASED_OUTPATIENT_CLINIC_OR_DEPARTMENT_OTHER): Payer: Medicare Other | Admitting: Hematology and Oncology

## 2016-02-25 ENCOUNTER — Telehealth: Payer: Self-pay | Admitting: *Deleted

## 2016-02-25 ENCOUNTER — Encounter: Payer: Self-pay | Admitting: Hematology and Oncology

## 2016-02-25 ENCOUNTER — Encounter: Payer: Self-pay | Admitting: *Deleted

## 2016-02-25 ENCOUNTER — Other Ambulatory Visit: Payer: Medicare Other

## 2016-02-25 ENCOUNTER — Telehealth: Payer: Self-pay | Admitting: Hematology and Oncology

## 2016-02-25 VITALS — BP 134/74 | HR 101 | Temp 98.1°F | Resp 18 | Ht 63.0 in | Wt 126.1 lb

## 2016-02-25 DIAGNOSIS — C8231 Follicular lymphoma grade IIIa, lymph nodes of head, face, and neck: Secondary | ICD-10-CM

## 2016-02-25 MED ORDER — ONDANSETRON HCL 8 MG PO TABS: 8.0000 mg | ORAL_TABLET | Freq: Three times a day (TID) | ORAL | 1 refills | Status: DC | PRN

## 2016-02-25 MED ORDER — PROCHLORPERAZINE MALEATE 10 MG PO TABS: 10.0000 mg | ORAL_TABLET | Freq: Four times a day (QID) | ORAL | 6 refills | Status: DC | PRN

## 2016-02-25 MED ORDER — PREDNISONE 20 MG PO TABS: 40.0000 mg/m2 | ORAL_TABLET | Freq: Every day | ORAL | 0 refills | Status: DC

## 2016-02-25 MED ORDER — ALLOPURINOL 300 MG PO TABS: 300.0000 mg | ORAL_TABLET | Freq: Every day | ORAL | 0 refills | Status: DC

## 2016-02-25 NOTE — Assessment & Plan Note (Addendum)
I discussed the case at the hematology tumor board. We'll review multiple test results and imaging study. Overall, she has stage I disease. Due to high-grade disease, we discussed treatment with chemotherapy. I do not feel it is beneficial for her to proceed with bone marrow biopsy. Due to lack of symptoms, I think it is reasonable to wait until after the holidays to start her on treatment Since I will only be prescribing 3 rounds of chemotherapy, I do not feel that she would need a port placement We discussed the role of chemotherapy. The intent is for cure. The decision was made based on publication at the [facility]. It is a category 1 recommendation from NCCN.  CHOP Chemotherapy plus Rituximab Compared with CHOP Alone in Elderly Patients with Diffuse Large-B-Cell Lymphoma Jannet Askew, M.D., Rexene Edison, M.D., Ph.D., Farley Ly, M.D., Crawford Givens, M.D., Knox Royalty, M.D., Ricky Ala, M.D., Doree Fudge, M.D., Eppie Gibson Richarda Blade, M.D., Simona Huh, M.D., Ph.D., Jolene Schimke, M.D., Bishop Limbo, M.D., Evelene Croon, Evelene Croon, Ph.D., and Sallyanne Kuster, M.D. [place] Stalling J Med 2002; 346:235-242January 24, 2002DOI: 10.1056/NEJMoa011795  The rate of complete response was significantly higher in the group that received CHOP plus rituximab than in the group that received CHOP alone (76 percent vs. 63 percent, P=0.005). With a median follow-up of two years, event-free and overall survival times were significantly higher in the CHOP-plus-rituximab group (P<0.001 and P=0.007, respectively). The addition of rituximab to standard CHOP chemotherapy significantly reduced the risk of treatment failure and death (risk ratios, 0.58 [95 percent confidence interval, 0.44 to 0.77] and 0.64 [0.45 to 0.89], respectively). Clinically relevant toxicity was not significantly greater with CHOP plus rituximab.  We discussed some of the risks, benefits and side-effects of Rituximab,Cytoxan,  Adriamycin,Vincristine and Solumedrol/Prednisone.   Some of the short term side-effects included, though not limited to, risk of fatigue, weight loss, tumor lysis syndrome, risk of allergic reactions, pancytopenia, life-threatening infections, need for transfusions of blood products, nausea, vomiting, change in bowel habits, hair loss, risk of congestive heart failure, admission to hospital for various reasons, and risks of death.   Long term side-effects are also discussed including permanent damage to nerve function, chronic fatigue, and rare secondary malignancy including bone marrow disorders.   The patient is aware that the response rates discussed earlier is not guaranteed.    After a long discussion, patient made an informed decision to proceed with the prescribed plan of care and went ahead to sign the consent form today.   Patient education material was dispensed I plan to bring her back in 2 weeks for blood draw before she start cycle 1 of treatment on 03/13/2016. I will start her on allopurinol for tumor lysis prophylaxis I will see her back prior to cycle 2 of treatment

## 2016-02-25 NOTE — Telephone Encounter (Signed)
Appointments scheduled per 11/21 LOS. Patient given AVS report and calendars with future scheduled appointments. °

## 2016-02-25 NOTE — Progress Notes (Signed)
START ON PATHWAY REGIMEN - Lymphoma and CLL  LYOS217: R-CHOP q21 Days x 3 Cycles with IFRT After the Completion of 3 Cycles   A cycle is every 21 days:     Rituximab (Rituxan(R)) 375 mg/m2 in _____ mL NS IV day 1 only. Dose Mod: None     Cyclophosphamide (Cytoxan(R)) 750 mg/m2 in 250 mL NS IV over 30 minutes day 1 only Dose Mod: None     Doxorubicin (Adriamycin(R)) 50 mg/m2 IV push day 1 only Dose Mod: None     Vincristine (Oncovin(R)) 1.4 mg/m2; MAX 2 mg in 50 mL normal saline IV over 20 minutes on day 1 only. (MAX DOSE 2 mg) Dose Mod: None     Prednisone 100 mg flat dose orally daily days 1,2,3,4,5. Dose Mod: None Additional Orders: Hepatitis B&C testing recommended prior to rituximab use on all patients. Final concentration of rituximab must be between 1 and 4 mg/mL.  **Always confirm dose/schedule in your pharmacy ordering system**    Patient Characteristics: Follicular, Grade 3B, First Line, Stage I / II, No Bulk Disease Type: Follicular Disease Type: Not Applicable Grade: Grade 3B [place] Stage: IA Line of therapy: First Line Disease Characteristics: No Bulk  Intent of Therapy: Curative Intent, Discussed with Patient

## 2016-02-25 NOTE — Telephone Encounter (Signed)
Per LOS I have scheduled appts and notified the scheduler 

## 2016-02-25 NOTE — Progress Notes (Signed)
[place] OFFICE PROGRESS NOTE  Patient Care Team: Unk Pinto, MD as PCP - General (Internal Medicine) Lafayette Dragon, MD (Inactive) as Consulting Physician (Gastroenterology) Loletha Carrow, MD (Pulmonary Disease) Calvert Cantor, MD as Consulting Physician (Ophthalmology) Heath Lark, MD as Consulting Physician (Hematology and Oncology) Rozetta Nunnery, MD as Consulting Physician (Otolaryngology)  SUMMARY OF ONCOLOGIC HISTORY:   Follicular lymphoma grade iiia, lymph nodes of head, face, and neck ([place])   01/29/2016 Imaging    Ct neck showed right submandibular lymphadenopathy and asymmetric prominence of jugular chain nodes. Metastatic disease is the primary diagnostic concern and submandibular node sampling is recommended. No primary lesion is seen, limited at the oral cavity due to dental artifact. 2. Emphysema      02/04/2016 Pathology Results    Accession: [id number] Biopsy showed high grade follicular lymphoma      02/24/2016 PET scan    Hypermetabolic right submandibular and right level II/III lymph nodes, consistent with the given history of lymphoma. No hypermetabolic lymph nodes in the chest, abdomen or pelvis. Aortic atherosclerosis (ICD10-170.0). Coronary artery calcification. Emphysema (ICD10-J43.9).      02/24/2016 Imaging    ECHO showed LV The cavity size was normal. Systolic function was   normal. The estimated ejection fraction was in the range of 55% to 60%. Wall motion was normal; there were no regional wall motion abnormalities. Doppler parameters are consistent with abnormal left ventricular relaxation (grade 1 diastolic dysfunction).       INTERVAL HISTORY: Please see below for problem oriented charting. She returns today to review test results. She denies new lymphadenopathy. No recent infection  REVIEW OF SYSTEMS:   Constitutional: Denies fevers, chills or abnormal weight loss Eyes: Denies blurriness of vision Ears, nose, mouth,  throat, and face: Denies mucositis or sore throat Respiratory: Denies cough, dyspnea or wheezes Cardiovascular: Denies palpitation, chest discomfort or lower extremity swelling Gastrointestinal:  Denies nausea, heartburn or change in bowel habits Skin: Denies abnormal skin rashes Lymphatics: Denies new lymphadenopathy or easy bruising Neurological:Denies numbness, tingling or new weaknesses Behavioral/Psych: Mood is stable, no new changes  All other systems were reviewed with the patient and are negative.  I have reviewed the past medical history, past surgical history, social history and family history with the patient and they are unchanged from previous note.  ALLERGIES:  has No Known Allergies.  MEDICATIONS:  Current Outpatient Prescriptions  Medication Sig Dispense Refill  . alendronate (FOSAMAX) 70 MG tablet Take 1 tablet weekly with a full glass of water for 30 minutes on an empty stomach 12 tablet 3  . atorvastatin (LIPITOR) 80 MG tablet TAKE ONE TABLET BY MOUTH ONCE DAILY FOR CHOLESTEROL 90 tablet 1  . Cholecalciferol 4000 UNITS CAPS Take 1 capsule by mouth daily.    . Ferrous Sulfate Dried (SLOW RELEASE IRON) 45 MG TBCR Take by mouth daily.    Marland Kitchen gabapentin (NEURONTIN) 300 MG capsule Take 1 capsule 3 to 4 x/day for pain. 360 capsule 1  . Magnesium 300 MG CAPS Take 1 capsule by mouth 3 (three) times daily.     Marland Kitchen omeprazole (PRILOSEC) 40 MG capsule Take 1 capsule daily for acid reflux 90 capsule 1  . potassium chloride (MICRO-K) 10 MEQ CR capsule TAKE TWO CAPSULES BY MOUTH ONCE DAILY 180 capsule 1  . tiotropium (SPIRIVA) 18 MCG inhalation capsule Place 18 mcg into inhaler and inhale daily.    . verapamil (CALAN-SR) 240 MG CR tablet TAKE ONE TABLET BY MOUTH ONCE DAILY AFTER  A MEAL FOR BLOOD PRESSURE 90 tablet 0  . allopurinol (ZYLOPRIM) 300 MG tablet Take 1 tablet (300 mg total) by mouth daily. 30 tablet 0  . ondansetron (ZOFRAN) 8 MG tablet Take 1 tablet (8 mg total) by mouth every  8 (eight) hours as needed for refractory nausea / vomiting. 30 tablet 1  . predniSONE (DELTASONE) 20 MG tablet Take 3 tablets (60 mg total) by mouth daily. Take on days 2-5 of chemotherapy. 36 tablet 0  . prochlorperazine (COMPAZINE) 10 MG tablet Take 1 tablet (10 mg total) by mouth every 6 (six) hours as needed (Nausea or vomiting). 30 tablet 6   No current facility-administered medications for this visit.     PHYSICAL EXAMINATION: ECOG PERFORMANCE STATUS: 0 - Asymptomatic  Vitals:   02/25/16 1252  BP: 134/74  Pulse: (!) 101  Resp: 18  Temp: 98.1 F (36.7 C)   Filed Weights   02/25/16 1252  Weight: 126 lb 1.6 oz (57.2 kg)    GENERAL:alert, no distress and comfortable SKIN: skin color, texture, turgor are normal, no rashes or significant lesions EYES: normal, Conjunctiva are pink and non-injected, sclera clear Musculoskeletal:no cyanosis of digits and no clubbing  NEURO: alert & oriented x 3 with fluent speech, no focal motor/sensory deficits  LABORATORY DATA:  I have reviewed the data as listed    Component Value Date/Time   NA 142 01/23/2016 1436   K 4.0 01/23/2016 1436   CL 100 01/23/2016 1436   CO2 28 01/23/2016 1436   GLUCOSE 79 01/23/2016 1436   BUN 12 01/23/2016 1436   CREATININE 1.04 (H) 01/23/2016 1436   CALCIUM 9.4 01/23/2016 1436   PROT 6.8 01/23/2016 1436   ALBUMIN 4.2 01/23/2016 1436   AST 21 01/23/2016 1436   ALT 19 01/23/2016 1436   ALKPHOS 53 01/23/2016 1436   BILITOT 0.4 01/23/2016 1436   GFRNONAA 53 (L) 01/23/2016 1436   GFRAA 62 01/23/2016 1436    No results found for: SPEP, UPEP  Lab Results  Component Value Date   WBC 6.6 01/23/2016   NEUTROABS 4,092 01/23/2016   HGB 13.1 01/23/2016   HCT 39.2 01/23/2016   MCV 92.7 01/23/2016   PLT 216 01/23/2016      Chemistry      Component Value Date/Time   NA 142 01/23/2016 1436   K 4.0 01/23/2016 1436   CL 100 01/23/2016 1436   CO2 28 01/23/2016 1436   BUN 12 01/23/2016 1436   CREATININE  1.04 (H) 01/23/2016 1436      Component Value Date/Time   CALCIUM 9.4 01/23/2016 1436   ALKPHOS 53 01/23/2016 1436   AST 21 01/23/2016 1436   ALT 19 01/23/2016 1436   BILITOT 0.4 01/23/2016 1436       RADIOGRAPHIC STUDIES: I have personally reviewed the radiological images as listed and agreed with the findings in the report. Nm Pet Image Initial (pi) Skull Base To Thigh  Result Date: 02/24/2016 CLINICAL DATA:  Initial treatment strategy for follicular lymphoma. EXAM: NUCLEAR MEDICINE PET SKULL BASE TO THIGH TECHNIQUE: 6.9 mCi F-18 FDG was injected intravenously. Full-ring PET imaging was performed from the skull base to thigh after the radiotracer. CT data was obtained and used for attenuation correction and anatomic localization. FASTING BLOOD GLUCOSE:  Value: 98 mg/dl COMPARISON:  CT neck 01/29/2016. FINDINGS: NECK 11 mm short axis right submandibular lymph node has an SUV max of 7.5. Additional hypermetabolic sub cm short axis right level 2 and 3 lymph nodes are  noted (CT images 34 and 37). CT images show no acute findings. CHEST No hypermetabolic mediastinal, hilar or axillary lymph nodes. No hypermetabolic pulmonary nodules. Atherosclerotic calcification of the arterial vasculature, including coronary arteries. No pericardial or pleural effusion. Moderate to severe centrilobular emphysema. 6 mm subpleural left lower lobe nodule does not show abnormal hypermetabolism but is too small for PET resolution. When reviewing report of CT chest 06/14/2014, a nodule of the same size is present in the subpleural left lower lobe. ABDOMEN/PELVIS No abnormal hypermetabolism in the liver, adrenal glands, spleen or pancreas. No hypermetabolic lymph nodes. 2.5 cm low-attenuation lesion in the left hepatic lobe is likely a cyst. Stones layer in the gallbladder. Adrenal glands and right kidney are unremarkable. Sub cm low-attenuation lesion off the lower pole left kidney is too small to characterize. Spleen,  pancreas, stomach and bowel are grossly unremarkable the exception of a small hiatal hernia. Atherosclerotic calcification of the arterial vasculature. SKELETON No abnormal osseous hypermetabolism. Degenerative changes are seen in the spine. IMPRESSION: 1. Hypermetabolic right submandibular and right level II/III lymph nodes, consistent with the given history of lymphoma. No hypermetabolic lymph nodes in the chest, abdomen or pelvis. 2. Aortic atherosclerosis (ICD10-170.0). Coronary artery calcification. 3.  Emphysema (ICD10-J43.9). 4. Cholelithiasis. Electronically Signed   By: Lorin Picket M.D.   On: 02/24/2016 12:52     ASSESSMENT & PLAN:  Follicular lymphoma grade iiia, lymph nodes of head, face, and neck ([place]) I discussed the case at the hematology tumor board. We'll review multiple test results and imaging study. Overall, she has stage I disease. Due to high-grade disease, we discussed treatment with chemotherapy. I do not feel it is beneficial for her to proceed with bone marrow biopsy. Due to lack of symptoms, I think it is reasonable to wait until after the holidays to start her on treatment Since I will only be prescribing 3 rounds of chemotherapy, I do not feel that she would need a port placement We discussed the role of chemotherapy. The intent is for cure. The decision was made based on publication at the [facility]. It is a category 1 recommendation from NCCN.  CHOP Chemotherapy plus Rituximab Compared with CHOP Alone in Elderly Patients with Diffuse Large-B-Cell Lymphoma Jannet Askew, M.D., Rexene Edison, M.D., Ph.D., Farley Ly, M.D., Crawford Givens, M.D., Knox Royalty, M.D., Ricky Ala, M.D., Doree Fudge, M.D., Eppie Gibson Richarda Blade, M.D., Simona Huh, M.D., Ph.D., Jolene Schimke, M.D., Bishop Limbo, M.D., Evelene Croon, Evelene Croon, Ph.D., and Sallyanne Kuster, M.D. Alison Stalling J Med 2002; 346:235-242January 24, 2002DOI: 10.1056/[id number]  The rate of  complete response was significantly higher in the group that received CHOP plus rituximab than in the group that received CHOP alone (76 percent vs. 63 percent, P=0.005). With a median follow-up of two years, event-free and overall survival times were significantly higher in the CHOP-plus-rituximab group (P<0.001 and P=0.007, respectively). The addition of rituximab to standard CHOP chemotherapy significantly reduced the risk of treatment failure and death (risk ratios, 0.58 [95 percent confidence interval, 0.44 to 0.77] and 0.64 [0.45 to 0.89], respectively). Clinically relevant toxicity was not significantly greater with CHOP plus rituximab.  We discussed some of the risks, benefits and side-effects of Rituximab,Cytoxan, Adriamycin,Vincristine and Solumedrol/Prednisone.   Some of the short term side-effects included, though not limited to, risk of fatigue, weight loss, tumor lysis syndrome, risk of allergic reactions, pancytopenia, life-threatening infections, need for transfusions of blood products, nausea, vomiting, change in bowel habits, hair loss, risk of congestive heart failure, admission  to hospital for various reasons, and risks of death.   Long term side-effects are also discussed including permanent damage to nerve function, chronic fatigue, and rare secondary malignancy including bone marrow disorders.   The patient is aware that the response rates discussed earlier is not guaranteed.    After a long discussion, patient made an informed decision to proceed with the prescribed plan of care and went ahead to sign the consent form today.   Patient education material was dispensed I plan to bring her back in 2 weeks for blood draw before she start cycle 1 of treatment on 03/13/2016. I will start her on allopurinol for tumor lysis prophylaxis I will see her back prior to cycle 2 of treatment   Orders Placed This Encounter  Procedures  . CBC with Differential    Standing Status:    Standing    Number of Occurrences:   20    Standing Expiration Date:   02/25/2017  . Comprehensive metabolic panel    Standing Status:   Standing    Number of Occurrences:   20    Standing Expiration Date:   02/25/2017  . Hepatitis B core antibody, IgM    Standing Status:   Future    Standing Expiration Date:   03/31/2017  . Hepatitis B surface antibody    Standing Status:   Future    Standing Expiration Date:   03/31/2017  . Lactate dehydrogenase    Standing Status:   Future    Standing Expiration Date:   03/31/2017  . Hepatitis B surface antigen    Standing Status:   Future    Standing Expiration Date:   03/31/2017  . Uric acid    Standing Status:   Future    Standing Expiration Date:   03/31/2017   All questions were answered. The patient knows to call the clinic with any problems, questions or concerns. No barriers to learning was detected. I spent 25 minutes counseling the patient face to face. The total time spent in the appointment was 40 minutes and more than 50% was on counseling and review of test results     Heath Lark, MD 02/25/2016 1:53 PM

## 2016-03-04 ENCOUNTER — Other Ambulatory Visit: Payer: Self-pay | Admitting: Hematology and Oncology

## 2016-03-10 ENCOUNTER — Encounter: Payer: Self-pay | Admitting: Internal Medicine

## 2016-03-10 ENCOUNTER — Other Ambulatory Visit: Payer: Self-pay | Admitting: Internal Medicine

## 2016-03-10 ENCOUNTER — Ambulatory Visit (INDEPENDENT_AMBULATORY_CARE_PROVIDER_SITE_OTHER): Payer: Medicare Other | Admitting: Internal Medicine

## 2016-03-10 ENCOUNTER — Other Ambulatory Visit (HOSPITAL_BASED_OUTPATIENT_CLINIC_OR_DEPARTMENT_OTHER): Payer: Medicare Other

## 2016-03-10 VITALS — BP 140/76 | HR 80 | Temp 97.5°F | Resp 16 | Ht 63.0 in | Wt 125.6 lb

## 2016-03-10 DIAGNOSIS — R509 Fever, unspecified: Secondary | ICD-10-CM

## 2016-03-10 DIAGNOSIS — C8231 Follicular lymphoma grade IIIa, lymph nodes of head, face, and neck: Secondary | ICD-10-CM | POA: Diagnosis not present

## 2016-03-10 LAB — COMPREHENSIVE METABOLIC PANEL
ALBUMIN: 3.8 g/dL (ref 3.5–5.0)
ALK PHOS: 68 U/L (ref 40–150)
ALT: 20 U/L (ref 0–55)
AST: 25 U/L (ref 5–34)
Anion Gap: 10 mEq/L (ref 3–11)
BUN: 13.7 mg/dL (ref 7.0–26.0)
CALCIUM: 8.8 mg/dL (ref 8.4–10.4)
CHLORIDE: 104 meq/L (ref 98–109)
CO2: 28 mEq/L (ref 22–29)
Creatinine: 1 mg/dL (ref 0.6–1.1)
EGFR: 54 mL/min/{1.73_m2} — AB (ref >90)
Glucose: 96 mg/dl (ref 70–140)
POTASSIUM: 3.7 meq/L (ref 3.5–5.1)
SODIUM: 141 meq/L (ref 136–145)
Total Bilirubin: 0.76 mg/dL (ref 0.20–1.20)
Total Protein: 7.5 g/dL (ref 6.4–8.3)

## 2016-03-10 LAB — CBC WITH DIFFERENTIAL/PLATELET
BASO%: 0.9 % (ref 0.0–2.0)
Basophils Absolute: 0 10*3/uL (ref 0.0–0.1)
EOS%: 1.4 % (ref 0.0–7.0)
Eosinophils Absolute: 0.1 10*3/uL (ref 0.0–0.5)
HCT: 40.5 % (ref 34.8–46.6)
HGB: 13.2 g/dL (ref 11.6–15.9)
LYMPH%: 17.5 % (ref 14.0–49.7)
MCH: 30.5 pg (ref 25.1–34.0)
MCHC: 32.5 g/dL (ref 31.5–36.0)
MCV: 94 fL (ref 79.5–101.0)
MONO#: 0.4 10*3/uL (ref 0.1–0.9)
MONO%: 8.4 % (ref 0.0–14.0)
NEUT%: 71.8 % (ref 38.4–76.8)
NEUTROS ABS: 3.8 10*3/uL (ref 1.5–6.5)
Platelets: 183 10*3/uL (ref 145–400)
RBC: 4.31 10*6/uL (ref 3.70–5.45)
RDW: 13.7 % (ref 11.2–14.5)
WBC: 5.3 10*3/uL (ref 3.9–10.3)
lymph#: 0.9 10*3/uL (ref 0.9–3.3)

## 2016-03-10 LAB — URIC ACID: Uric Acid, Serum: 7.3 mg/dl (ref 2.6–7.4)

## 2016-03-10 LAB — LACTATE DEHYDROGENASE: LDH: 210 U/L (ref 125–245)

## 2016-03-10 MED ORDER — LEVOFLOXACIN 500 MG PO TABS: ORAL_TABLET | ORAL | 0 refills | Status: DC

## 2016-03-10 NOTE — Progress Notes (Signed)
[facility]                       Azriella Mattia, M.D.        Amanda R. Collier, P.A.-C       Courtney Forcucci, P.A.-C   [facility]                [address]                [place] [address] Telephone [phone] Telefax [phone]    

## 2016-03-10 NOTE — Progress Notes (Signed)
[facility]   Unk Pinto, M.D.    Uvaldo Bristle. Silverio Lay, P.A.-C      Starlyn Skeans, P.A.-C  [facility]                [address] [place] [id number] Telephone [phone] Telefax [phone] Subjective:    Patient ID: Renee Hawkins, female    DOB: 1941/01/10, 75 y.o.   MRN: [id number]  HPI  Patient is a very nice 75 yo WWF recently dx/d wit Stage IIIa Follicular Lymphoma of the R neck and is scheduled to begin Chemo by Dr Arna Snipe in 3 datys. She presents today with c/o 24 hr hx/o chills ? Rigors, fever, myalgias and "runny nose",  w/o c/o ST, head/chest congestion, cough or UT sx's. Denies rash, sweats, pruritis or GI sx's.   Medication Sig  . alendronate (FOSAMAX) 70 MG tablet Take 1 tablet weekly with a full glass of water for 30 minutes on an empty stomach  . allopurinol (ZYLOPRIM) 300 MG tablet Take 1 tablet (300 mg total) by mouth daily.  Marland Kitchen atorvastatin (LIPITOR) 80 MG tablet TAKE ONE TABLET BY MOUTH ONCE DAILY FOR CHOLESTEROL  . Cholecalciferol 4000 UNITS CAPS Take 1 capsule by mouth daily.  . Ferrous Sulfate Dried (SLOW RELEASE IRON) 45 MG TBCR Take by mouth daily.  Marland Kitchen gabapentin (NEURONTIN) 300 MG capsule Take 1 capsule 3 to 4 x/day for pain.  . Magnesium 300 MG CAPS Take 1 capsule by mouth 3 (three) times daily.   Marland Kitchen omeprazole (PRILOSEC) 40 MG capsule Take 1 capsule daily for acid reflux  . ondansetron (ZOFRAN) 8 MG tablet Take 1 tablet (8 mg total) by mouth every 8 (eight) hours as needed for refractory nausea / vomiting.  . potassium chloride (MICRO-K) 10 MEQ CR capsule TAKE TWO CAPSULES BY MOUTH ONCE DAILY  . predniSONE (DELTASONE) 20 MG tablet Take 3 tablets (60 mg total) by mouth daily. Take on days 2-5 of chemotherapy.  . prochlorperazine (COMPAZINE) 10 MG tablet Take 1 tablet (10 mg total) by mouth every 6 (six) hours as needed (Nausea or vomiting).  Marland Kitchen tiotropium (SPIRIVA)  18 MCG inhalation capsule Place 18 mcg into inhaler and inhale daily.  . verapamil (CALAN-SR) 240 MG CR tablet TAKE ONE TABLET BY MOUTH ONCE DAILY AFTER A MEAL FOR BLOOD PRESSURE   Past Medical History:  Diagnosis Date  . Anxiety   . COPD (chronic obstructive pulmonary disease) ([place])   . Diverticulosis   . GERD (gastroesophageal reflux disease)   . Hx of adenomatous colonic polyps   . Hyperlipidemia   . Hypertension   . Hypomagnesemia   . Neuropathy ([place])   . Peripheral neuropathy ([place])   . Prediabetes   . RLS (restless legs syndrome)   . Sleep apnea    CPAP  . Vitamin B12 deficiency   . Vitamin D deficiency    Past Surgical History:  Procedure Laterality Date  . APPENDECTOMY    . COLONOSCOPY N/A 05/18/2014   Procedure: COLONOSCOPY;  Surgeon: Lafayette Dragon, MD;  Location: [facility];  Service: Endoscopy;  Laterality: N/A;  . DIRECT LARYNGOSCOPY Right 02/04/2016   Procedure: DIRECT LARYNGOSCOPY;  Surgeon: Rozetta Nunnery, MD;  Location: [facility];  Service: ENT;  Laterality: Right;  . EYE SURGERY  2017   Bilateral cataract Dr. Bing Plume  . LYMPH NODE BIOPSY Right  02/04/2016   Procedure: EXCISIONAL BIOPSY RIGHT NECK LYMPH NODE;  Surgeon: Rozetta Nunnery, MD;  Location: [facility];  Service: ENT;  Laterality: Right;  . TONSILLECTOMY     Review of Systems  10 point systems review negative except as above.    Objective:   Physical Exam  BP 140/76   Pulse 80   Temp 97.5 F (36.4 C)   Resp 16   Ht 5\' 3"  (1.6 m)   Wt 125 lb 9.6 oz (57 kg)   BMI 22.25 kg/m   HEENT - Eac's patent. TM's Nl. EOM's full. PERRLA. NasoOroPharynx clear. Neck - supple. Nl Thyroid. Carotids 2+ & No bruits, JVD. (+) tender Rt sub-mandibular adenopathy.  Chest - Clear equal BS w/o Rales, rhonchi, wheezes. Cor - Nl HS. RRR w/o sig MGR. PP 1(+). No edema. MS- FROM w/o deformities. Muscle power, tone and bulk Nl. Gait Nl. Neuro - No obvious Cr N abnormalities.  Sensory, motor and Cerebellar functions appear Nl w/o focal abnormalities.    Assessment & Plan:   1. Fever, unspecified fever cause  - Patient may likely have a viral prodrome, but in consideration of pending chemotx will emperically check U/C and begin Levaquin for Respiratory and UT coverage.   - Urinalysis, Routine w reflex microscopic - Urine culture  - levofloxacin (LEVAQUIN) 500 MG tablet; Take 1 tablet daily with food for suspected infection  Dispense: 7 tablet

## 2016-03-11 ENCOUNTER — Other Ambulatory Visit: Payer: Self-pay | Admitting: Hematology and Oncology

## 2016-03-11 LAB — URINALYSIS, ROUTINE W REFLEX MICROSCOPIC
Bilirubin Urine: NEGATIVE
Glucose, UA: NEGATIVE
HGB URINE DIPSTICK: NEGATIVE
KETONES UR: NEGATIVE
Leukocytes, UA: NEGATIVE
NITRITE: NEGATIVE
PH: 7.5 (ref 5.0–8.0)
Protein, ur: NEGATIVE
Specific Gravity, Urine: 1.017 (ref 1.001–1.035)

## 2016-03-11 LAB — URINE CULTURE

## 2016-03-11 LAB — HEPATITIS B CORE ANTIBODY, IGM: HEP B C IGM: NEGATIVE

## 2016-03-11 LAB — HEPATITIS B SURFACE ANTIBODY,QUALITATIVE: HEP B SURFACE AB, QUAL: NONREACTIVE

## 2016-03-11 LAB — HEPATITIS B SURFACE ANTIGEN: HBsAg Screen: NEGATIVE

## 2016-03-12 ENCOUNTER — Telehealth: Payer: Self-pay | Admitting: *Deleted

## 2016-03-12 ENCOUNTER — Ambulatory Visit (HOSPITAL_BASED_OUTPATIENT_CLINIC_OR_DEPARTMENT_OTHER): Payer: Medicare Other | Admitting: Hematology and Oncology

## 2016-03-12 ENCOUNTER — Ambulatory Visit (HOSPITAL_BASED_OUTPATIENT_CLINIC_OR_DEPARTMENT_OTHER): Payer: Medicare Other | Admitting: Nurse Practitioner

## 2016-03-12 VITALS — BP 141/69 | HR 96 | Temp 98.1°F | Resp 18 | Ht 63.0 in | Wt 122.3 lb

## 2016-03-12 DIAGNOSIS — C8231 Follicular lymphoma grade IIIa, lymph nodes of head, face, and neck: Secondary | ICD-10-CM

## 2016-03-12 DIAGNOSIS — E860 Dehydration: Secondary | ICD-10-CM

## 2016-03-12 DIAGNOSIS — R112 Nausea with vomiting, unspecified: Secondary | ICD-10-CM

## 2016-03-12 MED ORDER — ONDANSETRON HCL 4 MG/2ML IJ SOLN
8.0000 mg | Freq: Once | INTRAMUSCULAR | Status: AC
  Administered 2016-03-12: 8 mg via INTRAVENOUS

## 2016-03-12 MED ORDER — ALUM & MAG HYDROXIDE-SIMETH 200-200-20 MG/5ML PO SUSP
30.0000 mL | Freq: Once | ORAL | Status: AC
  Administered 2016-03-12: 30 mL via ORAL
  Filled 2016-03-12: qty 30

## 2016-03-12 MED ORDER — SODIUM CHLORIDE 0.9 % IV SOLN
Freq: Once | INTRAVENOUS | Status: AC
  Administered 2016-03-12: 15:00:00 via INTRAVENOUS

## 2016-03-12 MED ORDER — ONDANSETRON HCL 4 MG/2ML IJ SOLN
INTRAMUSCULAR | Status: AC
  Filled 2016-03-12: qty 4

## 2016-03-12 MED ORDER — SODIUM CHLORIDE 0.9 % IV SOLN: Freq: Once | INTRAVENOUS | Status: DC

## 2016-03-12 NOTE — Telephone Encounter (Signed)
Pt states her sister will bring her today and they can be here by 2 pm.  Urgent Scheduling message to see Dr. Alvy Bimler and get IVFs this afternoon.

## 2016-03-12 NOTE — Telephone Encounter (Signed)
Pt reports she has had "terrible heartburn for 2 days" and then last night developed diarrhea x 4 episodes and vomiting twice.  No vomiting or diarrhea this morning, but she has also not eaten anything today because of the heartburn. She says even drinking water makes the heartburn worse. She is scheduled to start chemo tomorrow.

## 2016-03-12 NOTE — Patient Instructions (Signed)
Dehydration, Adult Dehydration is a condition in which there is not enough fluid or water in the body. This happens when you lose more fluids than you take in. Important organs, such as the kidneys, brain, and heart, cannot function without a proper amount of fluids. Any loss of fluids from the body can lead to dehydration. Dehydration can range from mild to severe. This condition should be treated right away to prevent it from becoming severe. What are the causes? This condition may be caused by:  Vomiting.  Diarrhea.  Excessive sweating, such as from heat exposure or exercise.  Not drinking enough fluid, especially:  When ill.  While doing activity that requires a lot of energy.  Excessive urination.  Fever.  Infection.  Certain medicines, such as medicines that cause the body to lose excess fluid (diuretics).  Inability to access safe drinking water.  Reduced physical ability to get adequate water and food. What increases the risk? This condition is more likely to develop in people:  Who have a poorly controlled long-term (chronic) illness, such as diabetes, heart disease, or kidney disease.  Who are age 65 or older.  Who are disabled.  Who live in a place with high altitude.  Who play endurance sports. What are the signs or symptoms? Symptoms of mild dehydration may include:   Thirst.  Dry lips.  Slightly dry mouth.  Dry, warm skin.  Dizziness. Symptoms of moderate dehydration may include:   Very dry mouth.  Muscle cramps.  Dark urine. Urine may be the color of tea.  Decreased urine production.  Decreased tear production.  Heartbeat that is irregular or faster than normal (palpitations).  Headache.  Light-headedness, especially when you stand up from a sitting position.  Fainting (syncope). Symptoms of severe dehydration may include:   Changes in skin, such as:  Cold and clammy skin.  Blotchy (mottled) or pale skin.  Skin that does  not quickly return to normal after being lightly pinched and released (poor skin turgor).  Changes in body fluids, such as:  Extreme thirst.  No tear production.  Inability to sweat when body temperature is high, such as in hot weather.  Very little urine production.  Changes in vital signs, such as:  Weak pulse.  Pulse that is more than 100 beats a minute when sitting still.  Rapid breathing.  Low blood pressure.  Other changes, such as:  Sunken eyes.  Cold hands and feet.  Confusion.  Lack of energy (lethargy).  Difficulty waking up from sleep.  Short-term weight loss.  Unconsciousness. How is this diagnosed? This condition is diagnosed based on your symptoms and a physical exam. Blood and urine tests may be done to help confirm the diagnosis. How is this treated? Treatment for this condition depends on the severity. Mild or moderate dehydration can often be treated at home. Treatment should be started right away. Do not wait until dehydration becomes severe. Severe dehydration is an emergency and it needs to be treated in a hospital. Treatment for mild dehydration may include:   Drinking more fluids.  Replacing salts and minerals in your blood (electrolytes) that you may have lost. Treatment for moderate dehydration may include:   Drinking an oral rehydration solution (ORS). This is a drink that helps you replace fluids and electrolytes (rehydrate). It can be found at pharmacies and retail stores. Treatment for severe dehydration may include:   Receiving fluids through an IV tube.  Receiving an electrolyte solution through a feeding tube that is   passed through your nose and into your stomach (nasogastric tube, or NG tube).  Correcting any abnormalities in electrolytes.  Treating the underlying cause of dehydration. Follow these instructions at home:  If directed by your health care provider, drink an ORS:  Make an ORS by following instructions on the  package.  Start by drinking small amounts, about  cup (120 mL) every 5-10 minutes.  Slowly increase how much you drink until you have taken the amount recommended by your health care provider.  Drink enough clear fluid to keep your urine clear or pale yellow. If you were told to drink an ORS, finish the ORS first, then start slowly drinking other clear fluids. Drink fluids such as:  Water. Do not drink only water. Doing that can lead to having too little salt (sodium) in the body (hyponatremia).  Ice chips.  Fruit juice that you have added water to (diluted fruit juice).  Low-calorie sports drinks.  Avoid:  Alcohol.  Drinks that contain a lot of sugar. These include high-calorie sports drinks, fruit juice that is not diluted, and soda.  Caffeine.  Foods that are greasy or contain a lot of fat or sugar.  Take over-the-counter and prescription medicines only as told by your health care provider.  Do not take sodium tablets. This can lead to having too much sodium in the body (hypernatremia).  Eat foods that contain a healthy balance of electrolytes, such as bananas, oranges, potatoes, tomatoes, and spinach.  Keep all follow-up visits as told by your health care provider. This is important. Contact a health care provider if:  You have abdominal pain that:  Gets worse.  Stays in one area (localizes).  You have a rash.  You have a stiff neck.  You are more irritable than usual.  You are sleepier or more difficult to wake up than usual.  You feel weak or dizzy.  You feel very thirsty.  You have urinated only a small amount of very dark urine over 6-8 hours. Get help right away if:  You have symptoms of severe dehydration.  You cannot drink fluids without vomiting.  Your symptoms get worse with treatment.  You have a fever.  You have a severe headache.  You have vomiting or diarrhea that:  Gets worse.  Does not go away.  You have blood or green matter  (bile) in your vomit.  You have blood in your stool. This may cause stool to look black and tarry.  You have not urinated in 6-8 hours.  You faint.  Your heart rate while sitting still is over 100 beats a minute.  You have trouble breathing. This information is not intended to replace advice given to you by your health care provider. Make sure you discuss any questions you have with your health care provider. Document Released: 03/23/2005 Document Revised: 10/18/2015 Document Reviewed: 05/17/2015 Elsevier Interactive Patient Education  2017 [employer]  

## 2016-03-12 NOTE — Telephone Encounter (Signed)
Can she come in today for IVF support? I can add her on to see me today, whenever she can make it

## 2016-03-12 NOTE — Telephone Encounter (Signed)
Informed pt of Dr. Calton Dach reply and she asks for nurse to call her back in about one hour so she can see if she can line up some transportation.

## 2016-03-12 NOTE — Progress Notes (Signed)
Pt states she is feeling much better after her IVFs. Voided large amount of medium yellow urine after fluids completed. Pt to return to [facility] in the am for her 1st time [facility].  Reinforced need to keep vigilant with her oral fluids.  Pt voiced understanding.

## 2016-03-13 ENCOUNTER — Ambulatory Visit: Payer: Medicare Other | Admitting: Hematology and Oncology

## 2016-03-13 ENCOUNTER — Ambulatory Visit (HOSPITAL_BASED_OUTPATIENT_CLINIC_OR_DEPARTMENT_OTHER): Payer: Medicare Other

## 2016-03-13 VITALS — BP 107/49 | HR 84 | Temp 98.2°F | Resp 16

## 2016-03-13 DIAGNOSIS — Z5111 Encounter for antineoplastic chemotherapy: Secondary | ICD-10-CM | POA: Diagnosis present

## 2016-03-13 DIAGNOSIS — C8231 Follicular lymphoma grade IIIa, lymph nodes of head, face, and neck: Secondary | ICD-10-CM | POA: Diagnosis not present

## 2016-03-13 DIAGNOSIS — Z5112 Encounter for antineoplastic immunotherapy: Secondary | ICD-10-CM | POA: Diagnosis not present

## 2016-03-13 DIAGNOSIS — R112 Nausea with vomiting, unspecified: Secondary | ICD-10-CM | POA: Insufficient documentation

## 2016-03-13 MED ORDER — PALONOSETRON HCL INJECTION 0.25 MG/5ML
INTRAVENOUS | Status: AC
  Filled 2016-03-13: qty 5

## 2016-03-13 MED ORDER — ACETAMINOPHEN 325 MG PO TABS
ORAL_TABLET | ORAL | Status: AC
  Filled 2016-03-13: qty 2

## 2016-03-13 MED ORDER — DIPHENHYDRAMINE HCL 25 MG PO CAPS
ORAL_CAPSULE | ORAL | Status: AC
  Filled 2016-03-13: qty 2

## 2016-03-13 MED ORDER — PEGFILGRASTIM 6 MG/0.6ML ~~LOC~~ PSKT
6.0000 mg | PREFILLED_SYRINGE | Freq: Once | SUBCUTANEOUS | Status: AC
  Administered 2016-03-13: 6 mg via SUBCUTANEOUS
  Filled 2016-03-13: qty 0.6

## 2016-03-13 MED ORDER — ACETAMINOPHEN 325 MG PO TABS
650.0000 mg | ORAL_TABLET | Freq: Once | ORAL | Status: AC
  Administered 2016-03-13: 650 mg via ORAL

## 2016-03-13 MED ORDER — SODIUM CHLORIDE 0.9 % IV SOLN
375.0000 mg/m2 | Freq: Once | INTRAVENOUS | Status: AC
  Administered 2016-03-13: 600 mg via INTRAVENOUS
  Filled 2016-03-13: qty 50

## 2016-03-13 MED ORDER — SODIUM CHLORIDE 0.9 % IV SOLN
Freq: Once | INTRAVENOUS | Status: AC
  Administered 2016-03-13: 10:00:00 via INTRAVENOUS
  Filled 2016-03-13: qty 5

## 2016-03-13 MED ORDER — VINCRISTINE SULFATE CHEMO INJECTION 1 MG/ML
2.0000 mg | Freq: Once | INTRAVENOUS | Status: AC
  Administered 2016-03-13: 2 mg via INTRAVENOUS
  Filled 2016-03-13: qty 2

## 2016-03-13 MED ORDER — DIPHENHYDRAMINE HCL 25 MG PO CAPS
50.0000 mg | ORAL_CAPSULE | Freq: Once | ORAL | Status: AC
  Administered 2016-03-13: 50 mg via ORAL

## 2016-03-13 MED ORDER — SODIUM CHLORIDE 0.9 % IV SOLN
Freq: Once | INTRAVENOUS | Status: AC
  Administered 2016-03-13: 10:00:00 via INTRAVENOUS

## 2016-03-13 MED ORDER — DIPHENHYDRAMINE HCL 25 MG PO CAPS
ORAL_CAPSULE | ORAL | Status: AC
  Filled 2016-03-13: qty 1

## 2016-03-13 MED ORDER — PALONOSETRON HCL INJECTION 0.25 MG/5ML
0.2500 mg | Freq: Once | INTRAVENOUS | Status: AC
  Administered 2016-03-13: 0.25 mg via INTRAVENOUS

## 2016-03-13 MED ORDER — SODIUM CHLORIDE 0.9 % IV SOLN
750.0000 mg/m2 | Freq: Once | INTRAVENOUS | Status: AC
  Administered 2016-03-13: 1200 mg via INTRAVENOUS
  Filled 2016-03-13: qty 60

## 2016-03-13 MED ORDER — DOXORUBICIN HCL CHEMO IV INJECTION 2 MG/ML
50.0000 mg/m2 | Freq: Once | INTRAVENOUS | Status: AC
  Administered 2016-03-13: 80 mg via INTRAVENOUS
  Filled 2016-03-13: qty 40

## 2016-03-13 NOTE — Assessment & Plan Note (Signed)
She had recent nausea and vomiting of unknown etiology. Her symptoms have improved with hydration and IV antiemetics. Continue the same

## 2016-03-13 NOTE — Patient Instructions (Signed)
[facility] Discharge Instructions for Patients Receiving Chemotherapy  Today you received the following chemotherapy agents: Adriamycin, Vincristine, Cytoxan and Rituxan   To help prevent nausea and vomiting after your treatment, we encourage you to take your nausea medication as directed.    If you develop nausea and vomiting that is not controlled by your nausea medication, call the clinic.   BELOW ARE SYMPTOMS THAT SHOULD BE REPORTED IMMEDIATELY:  *FEVER GREATER THAN 100.5 F  *CHILLS WITH OR WITHOUT FEVER  NAUSEA AND VOMITING THAT IS NOT CONTROLLED WITH YOUR NAUSEA MEDICATION  *UNUSUAL SHORTNESS OF BREATH  *UNUSUAL BRUISING OR BLEEDING  TENDERNESS IN MOUTH AND THROAT WITH OR WITHOUT PRESENCE OF ULCERS  *URINARY PROBLEMS  *BOWEL PROBLEMS  UNUSUAL RASH Items with * indicate a potential emergency and should be followed up as soon as possible.  Feel free to call the clinic you have any questions or concerns. The clinic phone number is [phone].  Please show the CHEMO ALERT CARD at check-in to the Emergency Department and triage nurse.   

## 2016-03-13 NOTE — Assessment & Plan Note (Signed)
She had recent dehydration from nausea, vomiting and diarrhea. She received IV fluids with improvement of her symptoms. I recommend she takes antiemetics

## 2016-03-13 NOTE — Progress Notes (Signed)
[facility] OFFICE PROGRESS NOTE  Patient Care Team: Unk Pinto, MD as PCP - General (Internal Medicine) Lafayette Dragon, MD (Inactive) as Consulting Physician (Gastroenterology) Loletha Carrow, MD (Pulmonary Disease) Calvert Cantor, MD as Consulting Physician (Ophthalmology) Heath Lark, MD as Consulting Physician (Hematology and Oncology) Rozetta Nunnery, MD as Consulting Physician (Otolaryngology)  SUMMARY OF ONCOLOGIC HISTORY:   Follicular lymphoma grade iiia, lymph nodes of head, face, and neck ([place])   01/29/2016 Imaging    Ct neck showed right submandibular lymphadenopathy and asymmetric prominence of jugular chain nodes. Metastatic disease is the primary diagnostic concern and submandibular node sampling is recommended. No primary lesion is seen, limited at the oral cavity due to dental artifact. 2. Emphysema      02/04/2016 Pathology Results    Accession: [id number] Biopsy showed high grade follicular lymphoma      02/24/2016 PET scan    Hypermetabolic right submandibular and right level II/III lymph nodes, consistent with the given history of lymphoma. No hypermetabolic lymph nodes in the chest, abdomen or pelvis. Aortic atherosclerosis (ICD10-170.0). Coronary artery calcification. Emphysema (ICD10-J43.9).      02/24/2016 Imaging    ECHO showed LV The cavity size was normal. Systolic function was   normal. The estimated ejection fraction was in the range of 55% to 60%. Wall motion was normal; there were no regional wall motion abnormalities. Doppler parameters are consistent with abnormal left ventricular relaxation (grade 1 diastolic dysfunction).       INTERVAL HISTORY: Please see below for problem oriented charting. The patient called yesterday complaining of severe nausea, vomiting and dehydration. She also had diarrhea, resolved. She had recent chills. Denies fevers. No dysuria, frequency or urgency  REVIEW OF SYSTEMS:   Constitutional: Denies  fevers, or abnormal weight loss Eyes: Denies blurriness of vision Ears, nose, mouth, throat, and face: Denies mucositis or sore throat Respiratory: Denies cough, dyspnea or wheezes Cardiovascular: Denies palpitation, chest discomfort or lower extremity swelling Skin: Denies abnormal skin rashes Lymphatics: Denies new lymphadenopathy or easy bruising Neurological:Denies numbness, tingling or new weaknesses Behavioral/Psych: Mood is stable, no new changes  All other systems were reviewed with the patient and are negative.  I have reviewed the past medical history, past surgical history, social history and family history with the patient and they are unchanged from previous note.  ALLERGIES:  has No Known Allergies.  MEDICATIONS:  Current Outpatient Prescriptions  Medication Sig Dispense Refill  . alendronate (FOSAMAX) 70 MG tablet Take 1 tablet weekly with a full glass of water for 30 minutes on an empty stomach 12 tablet 3  . allopurinol (ZYLOPRIM) 300 MG tablet Take 1 tablet (300 mg total) by mouth daily. 30 tablet 0  . atorvastatin (LIPITOR) 80 MG tablet TAKE ONE TABLET BY MOUTH ONCE DAILY FOR CHOLESTEROL 90 tablet 1  . Cholecalciferol 4000 UNITS CAPS Take 1 capsule by mouth daily.    . Ferrous Sulfate Dried (SLOW RELEASE IRON) 45 MG TBCR Take by mouth daily.    Marland Kitchen gabapentin (NEURONTIN) 300 MG capsule Take 1 capsule 3 to 4 x/day for pain. 360 capsule 1  . levofloxacin (LEVAQUIN) 500 MG tablet Take 1 tablet daily with food for suspected infection 7 tablet 0  . Magnesium 300 MG CAPS Take 1 capsule by mouth 3 (three) times daily.     Marland Kitchen omeprazole (PRILOSEC) 40 MG capsule Take 1 capsule daily for acid reflux 90 capsule 1  . ondansetron (ZOFRAN) 8 MG tablet Take 1 tablet (8 mg total)  by mouth every 8 (eight) hours as needed for refractory nausea / vomiting. 30 tablet 1  . potassium chloride (MICRO-K) 10 MEQ CR capsule TAKE TWO CAPSULES BY MOUTH ONCE DAILY 180 capsule 1  . predniSONE  (DELTASONE) 20 MG tablet Take 3 tablets (60 mg total) by mouth daily. Take on days 2-5 of chemotherapy. 36 tablet 0  . prochlorperazine (COMPAZINE) 10 MG tablet Take 1 tablet (10 mg total) by mouth every 6 (six) hours as needed (Nausea or vomiting). 30 tablet 6  . tiotropium (SPIRIVA) 18 MCG inhalation capsule Place 18 mcg into inhaler and inhale daily.    . verapamil (CALAN-SR) 240 MG CR tablet TAKE ONE TABLET BY MOUTH ONCE DAILY AFTER A MEAL FOR BLOOD PRESSURE 90 tablet 0   No current facility-administered medications for this visit.     PHYSICAL EXAMINATION: ECOG PERFORMANCE STATUS: 1 - Symptomatic but completely ambulatory  Vitals:   03/12/16 1403  BP: (!) 141/69  Pulse: 96  Resp: 18  Temp: 98.1 F (36.7 C)   Filed Weights   03/12/16 1403  Weight: 122 lb (55.3 kg)    GENERAL:alert, no distress and comfortable SKIN: skin color, texture, turgor are normal, no rashes or significant lesions EYES: normal, Conjunctiva are pink and non-injected, sclera clear OROPHARYNX:no exudate, no erythema and lips, buccal mucosa, and tongue normal  NECK: supple, thyroid normal size, non-tender, without nodularity LYMPH:  no palpable lymphadenopathy in the cervical, axillary or inguinal LUNGS: clear to auscultation and percussion with normal breathing effort HEART: regular rate & rhythm and no murmurs and no lower extremity edema ABDOMEN:abdomen soft, non-tender and normal bowel sounds Musculoskeletal:no cyanosis of digits and no clubbing  NEURO: alert & oriented x 3 with fluent speech, no focal motor/sensory deficits  LABORATORY DATA:  I have reviewed the data as listed    Component Value Date/Time   NA 141 03/10/2016 1029   K 3.7 03/10/2016 1029   CL 100 01/23/2016 1436   CO2 28 03/10/2016 1029   GLUCOSE 96 03/10/2016 1029   BUN 13.7 03/10/2016 1029   CREATININE 1.0 03/10/2016 1029   CALCIUM 8.8 03/10/2016 1029   PROT 7.5 03/10/2016 1029   ALBUMIN 3.8 03/10/2016 1029   AST 25  03/10/2016 1029   ALT 20 03/10/2016 1029   ALKPHOS 68 03/10/2016 1029   BILITOT 0.76 03/10/2016 1029   GFRNONAA 53 (L) 01/23/2016 1436   GFRAA 62 01/23/2016 1436    No results found for: SPEP, UPEP  Lab Results  Component Value Date   WBC 5.3 03/10/2016   NEUTROABS 3.8 03/10/2016   HGB 13.2 03/10/2016   HCT 40.5 03/10/2016   MCV 94.0 03/10/2016   PLT 183 03/10/2016      Chemistry      Component Value Date/Time   NA 141 03/10/2016 1029   K 3.7 03/10/2016 1029   CL 100 01/23/2016 1436   CO2 28 03/10/2016 1029   BUN 13.7 03/10/2016 1029   CREATININE 1.0 03/10/2016 1029      Component Value Date/Time   CALCIUM 8.8 03/10/2016 1029   ALKPHOS 68 03/10/2016 1029   AST 25 03/10/2016 1029   ALT 20 03/10/2016 1029   BILITOT 0.76 03/10/2016 1029      ASSESSMENT & PLAN:  Dehydration She had recent dehydration from nausea, vomiting and diarrhea. She received IV fluids with improvement of her symptoms. I recommend she takes antiemetics  Nausea and vomiting She had recent nausea and vomiting of unknown etiology. Her symptoms have improved  with hydration and IV antiemetics. Continue the same  Follicular lymphoma grade iiia, lymph nodes of head, face, and neck ([place]) She will begin treatment as scheduled.   No orders of the defined types were placed in this encounter.  All questions were answered. The patient knows to call the clinic with any problems, questions or concerns. No barriers to learning was detected. I spent 15 minutes counseling the patient face to face. The total time spent in the appointment was 20 minutes and more than 50% was on counseling and review of test results     Heath Lark, MD 03/13/2016 9:16 AM

## 2016-03-13 NOTE — Assessment & Plan Note (Signed)
She will begin treatment as scheduled.

## 2016-03-16 ENCOUNTER — Telehealth: Payer: Self-pay | Admitting: *Deleted

## 2016-03-16 ENCOUNTER — Ambulatory Visit: Payer: Medicare Other

## 2016-03-16 NOTE — Telephone Encounter (Signed)
Pt states no n/v/d or fevers since chemo. She says she actually feels good.  She is taking prednisone as prescribed.  She says the Neulasta Onpro did not beep before infusing or after so she is a little worried it did not work.  But the light stopped flashing aprox 27 hrs after it was placed and the line is on Empty instead of Full.  Informed pt if it says Empty then it did infuse and ok to remove it (pt had not taken it off yet).  Reminded pt of appts 12/29 and to call us for any fevers or any other problems/concerns prior to next appt.. She verbalized understanding.

## 2016-03-19 ENCOUNTER — Telehealth: Payer: Self-pay

## 2016-03-19 MED ORDER — MAGIC MOUTHWASH: 5.0000 mL | ORAL | 0 refills | Status: DC | PRN

## 2016-03-19 NOTE — Telephone Encounter (Signed)
She probably has mucositis I recommend magic mouth wash swish and swallow Please call in prescription to local pharmacy to use it next few days and call Monday for report how she is doing

## 2016-03-19 NOTE — Addendum Note (Signed)
Addended by: Janace Hoard on: 03/19/2016 10:50 AM   Modules accepted: Orders

## 2016-03-19 NOTE — Telephone Encounter (Signed)
Pt has sore throat, it is raw and painful when she swallows. Started yesterday. No cough, no congestion. No fever. Pt unable to see throat but says tongue looks OK. Discussed biotene or salt water gargle.

## 2016-03-19 NOTE — Telephone Encounter (Signed)
S/w pt per Dr Alvy Bimler instructions. Phoned in [facility]W.

## 2016-04-03 ENCOUNTER — Ambulatory Visit (HOSPITAL_BASED_OUTPATIENT_CLINIC_OR_DEPARTMENT_OTHER): Payer: Medicare Other | Admitting: Hematology and Oncology

## 2016-04-03 ENCOUNTER — Ambulatory Visit (HOSPITAL_BASED_OUTPATIENT_CLINIC_OR_DEPARTMENT_OTHER): Payer: Medicare Other

## 2016-04-03 ENCOUNTER — Encounter: Payer: Self-pay | Admitting: Hematology and Oncology

## 2016-04-03 ENCOUNTER — Telehealth: Payer: Self-pay | Admitting: Hematology and Oncology

## 2016-04-03 ENCOUNTER — Other Ambulatory Visit (HOSPITAL_BASED_OUTPATIENT_CLINIC_OR_DEPARTMENT_OTHER): Payer: Medicare Other

## 2016-04-03 VITALS — BP 90/55 | HR 79 | Temp 98.4°F | Resp 16

## 2016-04-03 DIAGNOSIS — Z5112 Encounter for antineoplastic immunotherapy: Secondary | ICD-10-CM | POA: Diagnosis not present

## 2016-04-03 DIAGNOSIS — Z5111 Encounter for antineoplastic chemotherapy: Secondary | ICD-10-CM

## 2016-04-03 DIAGNOSIS — C8231 Follicular lymphoma grade IIIa, lymph nodes of head, face, and neck: Secondary | ICD-10-CM

## 2016-04-03 DIAGNOSIS — T451X5A Adverse effect of antineoplastic and immunosuppressive drugs, initial encounter: Secondary | ICD-10-CM | POA: Insufficient documentation

## 2016-04-03 DIAGNOSIS — D6481 Anemia due to antineoplastic chemotherapy: Secondary | ICD-10-CM

## 2016-04-03 LAB — CBC WITH DIFFERENTIAL/PLATELET
BASO%: 2.1 % — AB (ref 0.0–2.0)
Basophils Absolute: 0.1 10*3/uL (ref 0.0–0.1)
EOS%: 0.3 % (ref 0.0–7.0)
Eosinophils Absolute: 0 10*3/uL (ref 0.0–0.5)
HEMATOCRIT: 32.3 % — AB (ref 34.8–46.6)
HEMOGLOBIN: 10.8 g/dL — AB (ref 11.6–15.9)
LYMPH#: 0.9 10*3/uL (ref 0.9–3.3)
LYMPH%: 13.4 % — ABNORMAL LOW (ref 14.0–49.7)
MCH: 31.9 pg (ref 25.1–34.0)
MCHC: 33.6 g/dL (ref 31.5–36.0)
MCV: 95.1 fL (ref 79.5–101.0)
MONO#: 0.6 10*3/uL (ref 0.1–0.9)
MONO%: 8.8 % (ref 0.0–14.0)
NEUT%: 75.4 % (ref 38.4–76.8)
NEUTROS ABS: 5.1 10*3/uL (ref 1.5–6.5)
PLATELETS: 328 10*3/uL (ref 145–400)
RBC: 3.39 10*6/uL — ABNORMAL LOW (ref 3.70–5.45)
RDW: 14.2 % (ref 11.2–14.5)
WBC: 6.8 10*3/uL (ref 3.9–10.3)

## 2016-04-03 LAB — COMPREHENSIVE METABOLIC PANEL
ALT: 23 U/L (ref 0–55)
ANION GAP: 9 meq/L (ref 3–11)
AST: 19 U/L (ref 5–34)
Albumin: 3.4 g/dL — ABNORMAL LOW (ref 3.5–5.0)
Alkaline Phosphatase: 79 U/L (ref 40–150)
BILIRUBIN TOTAL: 0.25 mg/dL (ref 0.20–1.20)
BUN: 11.8 mg/dL (ref 7.0–26.0)
CALCIUM: 9 mg/dL (ref 8.4–10.4)
CO2: 28 mEq/L (ref 22–29)
CREATININE: 0.9 mg/dL (ref 0.6–1.1)
Chloride: 106 mEq/L (ref 98–109)
EGFR: 63 mL/min/{1.73_m2} — ABNORMAL LOW (ref >90)
Glucose: 96 mg/dl (ref 70–140)
Potassium: 4 mEq/L (ref 3.5–5.1)
Sodium: 142 mEq/L (ref 136–145)
TOTAL PROTEIN: 6.2 g/dL — AB (ref 6.4–8.3)

## 2016-04-03 MED ORDER — PEGFILGRASTIM 6 MG/0.6ML ~~LOC~~ PSKT
6.0000 mg | PREFILLED_SYRINGE | Freq: Once | SUBCUTANEOUS | Status: AC
  Administered 2016-04-03: 6 mg via SUBCUTANEOUS
  Filled 2016-04-03: qty 0.6

## 2016-04-03 MED ORDER — SODIUM CHLORIDE 0.9 % IV SOLN
Freq: Once | INTRAVENOUS | Status: AC
  Administered 2016-04-03: 11:00:00 via INTRAVENOUS
  Filled 2016-04-03: qty 5

## 2016-04-03 MED ORDER — VINCRISTINE SULFATE CHEMO INJECTION 1 MG/ML
2.0000 mg | Freq: Once | INTRAVENOUS | Status: AC
  Administered 2016-04-03: 2 mg via INTRAVENOUS
  Filled 2016-04-03: qty 2

## 2016-04-03 MED ORDER — SODIUM CHLORIDE 0.9 % IV SOLN
Freq: Once | INTRAVENOUS | Status: AC
  Administered 2016-04-03: 11:00:00 via INTRAVENOUS

## 2016-04-03 MED ORDER — SODIUM CHLORIDE 0.9 % IV SOLN
375.0000 mg/m2 | Freq: Once | INTRAVENOUS | Status: AC
  Administered 2016-04-03: 600 mg via INTRAVENOUS
  Filled 2016-04-03: qty 50

## 2016-04-03 MED ORDER — SODIUM CHLORIDE 0.9 % IV SOLN: 375.0000 mg/m2 | Freq: Once | INTRAVENOUS | Status: DC

## 2016-04-03 MED ORDER — PALONOSETRON HCL INJECTION 0.25 MG/5ML
0.2500 mg | Freq: Once | INTRAVENOUS | Status: AC
  Administered 2016-04-03: 0.25 mg via INTRAVENOUS

## 2016-04-03 MED ORDER — ACETAMINOPHEN 325 MG PO TABS
650.0000 mg | ORAL_TABLET | Freq: Once | ORAL | Status: AC
  Administered 2016-04-03: 650 mg via ORAL

## 2016-04-03 MED ORDER — PALONOSETRON HCL INJECTION 0.25 MG/5ML
INTRAVENOUS | Status: AC
  Filled 2016-04-03: qty 5

## 2016-04-03 MED ORDER — ACETAMINOPHEN 325 MG PO TABS
ORAL_TABLET | ORAL | Status: AC
  Filled 2016-04-03: qty 2

## 2016-04-03 MED ORDER — SODIUM CHLORIDE 0.9 % IV SOLN
750.0000 mg/m2 | Freq: Once | INTRAVENOUS | Status: AC
  Administered 2016-04-03: 1200 mg via INTRAVENOUS
  Filled 2016-04-03: qty 60

## 2016-04-03 MED ORDER — DIPHENHYDRAMINE HCL 25 MG PO CAPS
ORAL_CAPSULE | ORAL | Status: AC
  Filled 2016-04-03: qty 2

## 2016-04-03 MED ORDER — DIPHENHYDRAMINE HCL 25 MG PO CAPS
50.0000 mg | ORAL_CAPSULE | Freq: Once | ORAL | Status: AC
  Administered 2016-04-03: 50 mg via ORAL

## 2016-04-03 MED ORDER — DOXORUBICIN HCL CHEMO IV INJECTION 2 MG/ML
50.0000 mg/m2 | Freq: Once | INTRAVENOUS | Status: AC
  Administered 2016-04-03: 80 mg via INTRAVENOUS
  Filled 2016-04-03: qty 40

## 2016-04-03 NOTE — Assessment & Plan Note (Signed)
She tolerated treatment well with expected side effects. I will continue treatment without dose adjustment. Plan would be to complete 3 cycles of treatment and the repeat imaging study in February 2018

## 2016-04-03 NOTE — Telephone Encounter (Signed)
GAVE PATEINT AVS REPORT AND APPOINTMENTS FOR January

## 2016-04-03 NOTE — Patient Instructions (Signed)
[facility] Discharge Instructions for Patients Receiving Chemotherapy  Today you received the following chemotherapy agents Adriamycin, Vincristine, Cytoxan and Rituxan   To help prevent nausea and vomiting after your treatment, we encourage you to take your nausea medication as directed. No Zofran for 3 days. Take Compazine instead.     If you develop nausea and vomiting that is not controlled by your nausea medication, call the clinic.   BELOW ARE SYMPTOMS THAT SHOULD BE REPORTED IMMEDIATELY:  *FEVER GREATER THAN 100.5 F  *CHILLS WITH OR WITHOUT FEVER  NAUSEA AND VOMITING THAT IS NOT CONTROLLED WITH YOUR NAUSEA MEDICATION  *UNUSUAL SHORTNESS OF BREATH  *UNUSUAL BRUISING OR BLEEDING  TENDERNESS IN MOUTH AND THROAT WITH OR WITHOUT PRESENCE OF ULCERS  *URINARY PROBLEMS  *BOWEL PROBLEMS  UNUSUAL RASH Items with * indicate a potential emergency and should be followed up as soon as possible.  Feel free to call the clinic you have any questions or concerns. The clinic phone number is [phone].  Please show the [facility] at check-in to the Emergency Department and triage nurse.

## 2016-04-03 NOTE — Assessment & Plan Note (Signed)

## 2016-04-03 NOTE — Progress Notes (Signed)
[place] OFFICE PROGRESS NOTE  Patient Care Team: Unk Pinto, MD as PCP - General (Internal Medicine) Lafayette Dragon, MD (Inactive) as Consulting Physician (Gastroenterology) Loletha Carrow, MD (Pulmonary Disease) Calvert Cantor, MD as Consulting Physician (Ophthalmology) Heath Lark, MD as Consulting Physician (Hematology and Oncology) Rozetta Nunnery, MD as Consulting Physician (Otolaryngology)  SUMMARY OF ONCOLOGIC HISTORY:   Follicular lymphoma grade iiia, lymph nodes of head, face, and neck ([place])   01/29/2016 Imaging    Ct neck showed right submandibular lymphadenopathy and asymmetric prominence of jugular chain nodes. Metastatic disease is the primary diagnostic concern and submandibular node sampling is recommended. No primary lesion is seen, limited at the oral cavity due to dental artifact. 2. Emphysema      02/04/2016 Pathology Results    Accession: [id number] Biopsy showed high grade follicular lymphoma      02/24/2016 PET scan    Hypermetabolic right submandibular and right level II/III lymph nodes, consistent with the given history of lymphoma. No hypermetabolic lymph nodes in the chest, abdomen or pelvis. Aortic atherosclerosis (ICD10-170.0). Coronary artery calcification. Emphysema (ICD10-J43.9).      02/24/2016 Imaging    ECHO showed LV The cavity size was normal. Systolic function was   normal. The estimated ejection fraction was in the range of 55% to 60%. Wall motion was normal; there were no regional wall motion abnormalities. Doppler parameters are consistent with abnormal left ventricular relaxation (grade 1 diastolic dysfunction).      03/13/2016 -  Chemotherapy    The patient had R-CHOP chemo       INTERVAL HISTORY: Please see below for problem oriented charting. She is seen prior to cycle 2 of treatment. She had recent nausea, resolved with conservative management. Denies recent infection. No peripheral neuropathy, chest pain or  shortness of breath The patient denies any recent signs or symptoms of bleeding such as spontaneous epistaxis, hematuria or hematochezia.  REVIEW OF SYSTEMS:   Constitutional: Denies fevers, chills or abnormal weight loss Eyes: Denies blurriness of vision Ears, nose, mouth, throat, and face: Denies mucositis or sore throat Respiratory: Denies cough, dyspnea or wheezes Cardiovascular: Denies palpitation, chest discomfort or lower extremity swelling Gastrointestinal:  Denies nausea, heartburn or change in bowel habits Skin: Denies abnormal skin rashes Lymphatics: Denies new lymphadenopathy or easy bruising Neurological:Denies numbness, tingling or new weaknesses Behavioral/Psych: Mood is stable, no new changes  All other systems were reviewed with the patient and are negative.  I have reviewed the past medical history, past surgical history, social history and family history with the patient and they are unchanged from previous note.  ALLERGIES:  has No Known Allergies.  MEDICATIONS:  Current Outpatient Prescriptions  Medication Sig Dispense Refill  . alendronate (FOSAMAX) 70 MG tablet Take 1 tablet weekly with a full glass of water for 30 minutes on an empty stomach 12 tablet 3  . allopurinol (ZYLOPRIM) 300 MG tablet Take 1 tablet (300 mg total) by mouth daily. 30 tablet 0  . atorvastatin (LIPITOR) 80 MG tablet TAKE ONE TABLET BY MOUTH ONCE DAILY FOR CHOLESTEROL 90 tablet 1  . Cholecalciferol 4000 UNITS CAPS Take 1 capsule by mouth daily.    . Ferrous Sulfate Dried (SLOW RELEASE IRON) 45 MG TBCR Take by mouth daily.    Marland Kitchen gabapentin (NEURONTIN) 300 MG capsule Take 1 capsule 3 to 4 x/day for pain. 360 capsule 1  . magic mouthwash SOLN Take 5 mLs by mouth every 4 (four) hours as needed for mouth pain. Swish  and swallow 240 mL 0  . Magnesium 300 MG CAPS Take 1 capsule by mouth 3 (three) times daily.     Marland Kitchen omeprazole (PRILOSEC) 40 MG capsule Take 1 capsule daily for acid reflux 90 capsule 1   . ondansetron (ZOFRAN) 8 MG tablet Take 1 tablet (8 mg total) by mouth every 8 (eight) hours as needed for refractory nausea / vomiting. 30 tablet 1  . potassium chloride (MICRO-K) 10 MEQ CR capsule TAKE TWO CAPSULES BY MOUTH ONCE DAILY 180 capsule 1  . predniSONE (DELTASONE) 20 MG tablet Take 3 tablets (60 mg total) by mouth daily. Take on days 2-5 of chemotherapy. 36 tablet 0  . prochlorperazine (COMPAZINE) 10 MG tablet Take 1 tablet (10 mg total) by mouth every 6 (six) hours as needed (Nausea or vomiting). 30 tablet 6  . tiotropium (SPIRIVA) 18 MCG inhalation capsule Place 18 mcg into inhaler and inhale daily.    . verapamil (CALAN-SR) 240 MG CR tablet TAKE ONE TABLET BY MOUTH ONCE DAILY AFTER A MEAL FOR BLOOD PRESSURE 90 tablet 0   No current facility-administered medications for this visit.    Facility-Administered Medications Ordered in Other Visits  Medication Dose Route Frequency Provider Last Rate Last Dose  . acetaminophen (TYLENOL) tablet 650 mg  650 mg Oral Once Heath Lark, MD      . cyclophosphamide (CYTOXAN) 1,200 mg in sodium chloride 0.9 % 250 mL chemo infusion  750 mg/m2 (Treatment Plan Recorded) Intravenous Once Heath Lark, MD      . diphenhydrAMINE (BENADRYL) capsule 50 mg  50 mg Oral Once Heath Lark, MD      . pegfilgrastim (NEULASTA ONPRO KIT) injection 6 mg  6 mg Subcutaneous Once Heath Lark, MD      . riTUXimab (RITUXAN) 600 mg in sodium chloride 0.9 % 190 mL chemo infusion  375 mg/m2 (Treatment Plan Recorded) Intravenous Once Heath Lark, MD      . vinCRIStine (ONCOVIN) 2 mg in sodium chloride 0.9 % 50 mL chemo infusion  2 mg Intravenous Once Heath Lark, MD        PHYSICAL EXAMINATION: ECOG PERFORMANCE STATUS: 1 - Symptomatic but completely ambulatory  Vitals:   04/03/16 1015  BP: (!) 118/95  Pulse: 91  Resp: 19  Temp: 98.3 F (36.8 C)   Filed Weights   04/03/16 1015  Weight: 121 lb 1.6 oz (54.9 kg)    GENERAL:alert, no distress and comfortable SKIN: skin  color, texture, turgor are normal, no rashes or significant lesions EYES: normal, Conjunctiva are pink and non-injected, sclera clear OROPHARYNX:no exudate, no erythema and lips, buccal mucosa, and tongue normal  NECK: supple, thyroid normal size, non-tender, without nodularity LYMPH:  no palpable lymphadenopathy in the cervical, axillary or inguinal LUNGS: clear to auscultation and percussion with normal breathing effort HEART: regular rate & rhythm and no murmurs and no lower extremity edema ABDOMEN:abdomen soft, non-tender and normal bowel sounds Musculoskeletal:no cyanosis of digits and no clubbing  NEURO: alert & oriented x 3 with fluent speech, no focal motor/sensory deficits  LABORATORY DATA:  I have reviewed the data as listed    Component Value Date/Time   NA 142 04/03/2016 0956   K 4.0 04/03/2016 0956   CL 100 01/23/2016 1436   CO2 28 04/03/2016 0956   GLUCOSE 96 04/03/2016 0956   BUN 11.8 04/03/2016 0956   CREATININE 0.9 04/03/2016 0956   CALCIUM 9.0 04/03/2016 0956   PROT 6.2 (L) 04/03/2016 0956   ALBUMIN 3.4 (L) 04/03/2016 9357  AST 19 04/03/2016 0956   ALT 23 04/03/2016 0956   ALKPHOS 79 04/03/2016 0956   BILITOT 0.25 04/03/2016 0956   GFRNONAA 53 (L) 01/23/2016 1436   GFRAA 62 01/23/2016 1436    No results found for: SPEP, UPEP  Lab Results  Component Value Date   WBC 6.8 04/03/2016   NEUTROABS 5.1 04/03/2016   HGB 10.8 (L) 04/03/2016   HCT 32.3 (L) 04/03/2016   MCV 95.1 04/03/2016   PLT 328 04/03/2016      Chemistry      Component Value Date/Time   NA 142 04/03/2016 0956   K 4.0 04/03/2016 0956   CL 100 01/23/2016 1436   CO2 28 04/03/2016 0956   BUN 11.8 04/03/2016 0956   CREATININE 0.9 04/03/2016 0956      Component Value Date/Time   CALCIUM 9.0 04/03/2016 0956   ALKPHOS 79 04/03/2016 0956   AST 19 04/03/2016 0956   ALT 23 04/03/2016 0956   BILITOT 0.25 04/03/2016 0956      ASSESSMENT & PLAN:  Follicular lymphoma grade iiia, lymph  nodes of head, face, and neck ([place]) She tolerated treatment well with expected side effects. I will continue treatment without dose adjustment. Plan would be to complete 3 cycles of treatment and the repeat imaging study in February 2018  Anemia due to antineoplastic chemotherapy This is likely due to recent treatment. The patient denies recent history of bleeding such as epistaxis, hematuria or hematochezia. She is asymptomatic from the anemia. I will observe for now.  She does not require transfusion now. I will continue the chemotherapy at current dose without dosage adjustment.  If the anemia gets progressive worse in the future, I might have to delay her treatment or adjust the chemotherapy dose.    No orders of the defined types were placed in this encounter.  All questions were answered. The patient knows to call the clinic with any problems, questions or concerns. No barriers to learning was detected. I spent 15 minutes counseling the patient face to face. The total time spent in the appointment was 20 minutes and more than 50% was on counseling and review of test results     Heath Lark, MD 04/03/2016 12:26 PM

## 2016-04-24 ENCOUNTER — Other Ambulatory Visit (HOSPITAL_BASED_OUTPATIENT_CLINIC_OR_DEPARTMENT_OTHER): Payer: Medicare Other

## 2016-04-24 ENCOUNTER — Ambulatory Visit (HOSPITAL_BASED_OUTPATIENT_CLINIC_OR_DEPARTMENT_OTHER): Payer: Medicare Other | Admitting: Hematology and Oncology

## 2016-04-24 ENCOUNTER — Ambulatory Visit (HOSPITAL_BASED_OUTPATIENT_CLINIC_OR_DEPARTMENT_OTHER): Payer: Medicare Other

## 2016-04-24 ENCOUNTER — Encounter: Payer: Self-pay | Admitting: Hematology and Oncology

## 2016-04-24 ENCOUNTER — Telehealth: Payer: Self-pay | Admitting: Hematology and Oncology

## 2016-04-24 VITALS — BP 116/51 | HR 101 | Temp 98.2°F | Resp 18 | Ht 63.0 in | Wt 116.0 lb

## 2016-04-24 VITALS — BP 103/53 | HR 86 | Temp 98.3°F | Resp 17

## 2016-04-24 DIAGNOSIS — C8231 Follicular lymphoma grade IIIa, lymph nodes of head, face, and neck: Secondary | ICD-10-CM

## 2016-04-24 DIAGNOSIS — T451X5A Adverse effect of antineoplastic and immunosuppressive drugs, initial encounter: Secondary | ICD-10-CM

## 2016-04-24 DIAGNOSIS — Z5112 Encounter for antineoplastic immunotherapy: Secondary | ICD-10-CM

## 2016-04-24 DIAGNOSIS — D6481 Anemia due to antineoplastic chemotherapy: Secondary | ICD-10-CM

## 2016-04-24 DIAGNOSIS — Z5111 Encounter for antineoplastic chemotherapy: Secondary | ICD-10-CM

## 2016-04-24 LAB — CBC WITH DIFFERENTIAL/PLATELET
BASO%: 0.4 % (ref 0.0–2.0)
Basophils Absolute: 0 10*3/uL (ref 0.0–0.1)
EOS%: 0.1 % (ref 0.0–7.0)
Eosinophils Absolute: 0 10*3/uL (ref 0.0–0.5)
HEMATOCRIT: 31.6 % — AB (ref 34.8–46.6)
HGB: 10 g/dL — ABNORMAL LOW (ref 11.6–15.9)
LYMPH#: 0.5 10*3/uL — AB (ref 0.9–3.3)
LYMPH%: 5.8 % — ABNORMAL LOW (ref 14.0–49.7)
MCH: 31.3 pg (ref 25.1–34.0)
MCHC: 31.6 g/dL (ref 31.5–36.0)
MCV: 98.8 fL (ref 79.5–101.0)
MONO#: 1.2 10*3/uL — AB (ref 0.1–0.9)
MONO%: 14.1 % — ABNORMAL HIGH (ref 0.0–14.0)
NEUT%: 79.6 % — ABNORMAL HIGH (ref 38.4–76.8)
NEUTROS ABS: 6.7 10*3/uL — AB (ref 1.5–6.5)
PLATELETS: 347 10*3/uL (ref 145–400)
RBC: 3.2 10*6/uL — ABNORMAL LOW (ref 3.70–5.45)
RDW: 15.9 % — AB (ref 11.2–14.5)
WBC: 8.5 10*3/uL (ref 3.9–10.3)

## 2016-04-24 LAB — COMPREHENSIVE METABOLIC PANEL WITH GFR
ALT: 11 U/L (ref 0–55)
AST: 12 U/L (ref 5–34)
Albumin: 3.2 g/dL — ABNORMAL LOW (ref 3.5–5.0)
Alkaline Phosphatase: 95 U/L (ref 40–150)
Anion Gap: 9 meq/L (ref 3–11)
BUN: 9.5 mg/dL (ref 7.0–26.0)
CO2: 28 meq/L (ref 22–29)
Calcium: 9.3 mg/dL (ref 8.4–10.4)
Chloride: 105 meq/L (ref 98–109)
Creatinine: 0.8 mg/dL (ref 0.6–1.1)
EGFR: 73 ml/min/1.73 m2 — ABNORMAL LOW
Glucose: 94 mg/dL (ref 70–140)
Potassium: 3.7 meq/L (ref 3.5–5.1)
Sodium: 143 meq/L (ref 136–145)
Total Bilirubin: 0.33 mg/dL (ref 0.20–1.20)
Total Protein: 6.1 g/dL — ABNORMAL LOW (ref 6.4–8.3)

## 2016-04-24 MED ORDER — ACETAMINOPHEN 325 MG PO TABS
ORAL_TABLET | ORAL | Status: AC
  Filled 2016-04-24: qty 2

## 2016-04-24 MED ORDER — DOXORUBICIN HCL CHEMO IV INJECTION 2 MG/ML
50.0000 mg/m2 | Freq: Once | INTRAVENOUS | Status: AC
  Administered 2016-04-24: 80 mg via INTRAVENOUS
  Filled 2016-04-24: qty 40

## 2016-04-24 MED ORDER — PALONOSETRON HCL INJECTION 0.25 MG/5ML
0.2500 mg | Freq: Once | INTRAVENOUS | Status: AC
  Administered 2016-04-24: 0.25 mg via INTRAVENOUS

## 2016-04-24 MED ORDER — SODIUM CHLORIDE 0.9 % IV SOLN
Freq: Once | INTRAVENOUS | Status: AC
  Administered 2016-04-24: 11:00:00 via INTRAVENOUS
  Filled 2016-04-24: qty 5

## 2016-04-24 MED ORDER — DIPHENHYDRAMINE HCL 25 MG PO CAPS
50.0000 mg | ORAL_CAPSULE | Freq: Once | ORAL | Status: AC
  Administered 2016-04-24: 50 mg via ORAL

## 2016-04-24 MED ORDER — SODIUM CHLORIDE 0.9 % IV SOLN
375.0000 mg/m2 | Freq: Once | INTRAVENOUS | Status: AC
  Administered 2016-04-24: 600 mg via INTRAVENOUS
  Filled 2016-04-24: qty 50

## 2016-04-24 MED ORDER — ACETAMINOPHEN 325 MG PO TABS
650.0000 mg | ORAL_TABLET | Freq: Once | ORAL | Status: AC
  Administered 2016-04-24: 650 mg via ORAL

## 2016-04-24 MED ORDER — SODIUM CHLORIDE 0.9 % IV SOLN
750.0000 mg/m2 | Freq: Once | INTRAVENOUS | Status: AC
  Administered 2016-04-24: 1200 mg via INTRAVENOUS
  Filled 2016-04-24: qty 60

## 2016-04-24 MED ORDER — DIPHENHYDRAMINE HCL 25 MG PO CAPS
ORAL_CAPSULE | ORAL | Status: AC
  Filled 2016-04-24: qty 2

## 2016-04-24 MED ORDER — SODIUM CHLORIDE 0.9 % IV SOLN
Freq: Once | INTRAVENOUS | Status: AC
  Administered 2016-04-24: 10:00:00 via INTRAVENOUS

## 2016-04-24 MED ORDER — SODIUM CHLORIDE 0.9 % IV SOLN
2.0000 mg | Freq: Once | INTRAVENOUS | Status: AC
  Administered 2016-04-24: 2 mg via INTRAVENOUS
  Filled 2016-04-24: qty 2

## 2016-04-24 MED ORDER — PEGFILGRASTIM 6 MG/0.6ML ~~LOC~~ PSKT
6.0000 mg | PREFILLED_SYRINGE | Freq: Once | SUBCUTANEOUS | Status: AC
  Administered 2016-04-24: 6 mg via SUBCUTANEOUS
  Filled 2016-04-24: qty 0.6

## 2016-04-24 MED ORDER — PALONOSETRON HCL INJECTION 0.25 MG/5ML
INTRAVENOUS | Status: AC
  Filled 2016-04-24: qty 5

## 2016-04-24 NOTE — Assessment & Plan Note (Addendum)
She tolerated treatment well with expected side effects. I will continue treatment without dose adjustment. Plan would be to complete 3 cycles of treatment and the repeat imaging study in February 2018 We discussed briefly about the role of maintenance treatment with immunotherapy

## 2016-04-24 NOTE — Telephone Encounter (Signed)
Appointments scheduled per 11/9 LOS. Patient given AVS report and calendars with future scheduled appointments. °

## 2016-04-24 NOTE — Progress Notes (Signed)
Per Dr. Alvy Bimler okay to treat today with heart rate of 103.

## 2016-04-24 NOTE — Patient Instructions (Signed)
[facility] Discharge Instructions for Patients Receiving Chemotherapy  Today you received the following chemotherapy agents: Adriamycin, Vincristine, Cytoxan and Rituxan   To help prevent nausea and vomiting after your treatment, we encourage you to take your nausea medication as directed.    If you develop nausea and vomiting that is not controlled by your nausea medication, call the clinic.   BELOW ARE SYMPTOMS THAT SHOULD BE REPORTED IMMEDIATELY:  *FEVER GREATER THAN 100.5 F  *CHILLS WITH OR WITHOUT FEVER  NAUSEA AND VOMITING THAT IS NOT CONTROLLED WITH YOUR NAUSEA MEDICATION  *UNUSUAL SHORTNESS OF BREATH  *UNUSUAL BRUISING OR BLEEDING  TENDERNESS IN MOUTH AND THROAT WITH OR WITHOUT PRESENCE OF ULCERS  *URINARY PROBLEMS  *BOWEL PROBLEMS  UNUSUAL RASH Items with * indicate a potential emergency and should be followed up as soon as possible.  Feel free to call the clinic you have any questions or concerns. The clinic phone number is [phone].  Please show the CHEMO ALERT CARD at check-in to the Emergency Department and triage nurse.   

## 2016-04-24 NOTE — Progress Notes (Signed)
[place] OFFICE PROGRESS NOTE  Patient Care Team: Unk Pinto, MD as PCP - General (Internal Medicine) Lafayette Dragon, MD (Inactive) as Consulting Physician (Gastroenterology) Loletha Carrow, MD (Pulmonary Disease) Calvert Cantor, MD as Consulting Physician (Ophthalmology) Heath Lark, MD as Consulting Physician (Hematology and Oncology) Rozetta Nunnery, MD as Consulting Physician (Otolaryngology)  SUMMARY OF ONCOLOGIC HISTORY:   Follicular lymphoma grade iiia, lymph nodes of head, face, and neck ([place])   01/29/2016 Imaging    Ct neck showed right submandibular lymphadenopathy and asymmetric prominence of jugular chain nodes. Metastatic disease is the primary diagnostic concern and submandibular node sampling is recommended. No primary lesion is seen, limited at the oral cavity due to dental artifact. 2. Emphysema      02/04/2016 Pathology Results    Accession: [id number] Biopsy showed high grade follicular lymphoma      02/24/2016 PET scan    Hypermetabolic right submandibular and right level II/III lymph nodes, consistent with the given history of lymphoma. No hypermetabolic lymph nodes in the chest, abdomen or pelvis. Aortic atherosclerosis (ICD10-170.0). Coronary artery calcification. Emphysema (ICD10-J43.9).      02/24/2016 Imaging    ECHO showed LV The cavity size was normal. Systolic function was   normal. The estimated ejection fraction was in the range of 55% to 60%. Wall motion was normal; there were no regional wall motion abnormalities. Doppler parameters are consistent with abnormal left ventricular relaxation (grade 1 diastolic dysfunction).      03/13/2016 -  Chemotherapy    The patient had R-CHOP chemo       INTERVAL HISTORY: Please see below for problem oriented charting. She returns today for cycle 3 of chemotherapy She tolerated treatment very well She has some mild myalgias and arthralgias after last treatment but it resolved  spontaneously She denies recent mucositis, nausea or vomiting Denies peripheral neuropathy No recent infection  REVIEW OF SYSTEMS:   Constitutional: Denies fevers, chills or abnormal weight loss Eyes: Denies blurriness of vision Ears, nose, mouth, throat, and face: Denies mucositis or sore throat Respiratory: Denies cough, dyspnea or wheezes Cardiovascular: Denies palpitation, chest discomfort or lower extremity swelling Gastrointestinal:  Denies nausea, heartburn or change in bowel habits Skin: Denies abnormal skin rashes Lymphatics: Denies new lymphadenopathy or easy bruising Neurological:Denies numbness, tingling or new weaknesses Behavioral/Psych: Mood is stable, no new changes  All other systems were reviewed with the patient and are negative.  I have reviewed the past medical history, past surgical history, social history and family history with the patient and they are unchanged from previous note.  ALLERGIES:  has No Known Allergies.  MEDICATIONS:  Current Outpatient Prescriptions  Medication Sig Dispense Refill  . alendronate (FOSAMAX) 70 MG tablet Take 1 tablet weekly with a full glass of water for 30 minutes on an empty stomach 12 tablet 3  . allopurinol (ZYLOPRIM) 300 MG tablet Take 1 tablet (300 mg total) by mouth daily. 30 tablet 0  . atorvastatin (LIPITOR) 80 MG tablet TAKE ONE TABLET BY MOUTH ONCE DAILY FOR CHOLESTEROL 90 tablet 1  . Cholecalciferol 4000 UNITS CAPS Take 1 capsule by mouth daily.    . Ferrous Sulfate Dried (SLOW RELEASE IRON) 45 MG TBCR Take by mouth daily.    Marland Kitchen gabapentin (NEURONTIN) 300 MG capsule Take 1 capsule 3 to 4 x/day for pain. 360 capsule 1  . magic mouthwash SOLN Take 5 mLs by mouth every 4 (four) hours as needed for mouth pain. Swish and swallow 240 mL 0  .  Magnesium 300 MG CAPS Take 1 capsule by mouth 3 (three) times daily.     Marland Kitchen omeprazole (PRILOSEC) 40 MG capsule Take 1 capsule daily for acid reflux 90 capsule 1  . ondansetron (ZOFRAN)  8 MG tablet Take 1 tablet (8 mg total) by mouth every 8 (eight) hours as needed for refractory nausea / vomiting. 30 tablet 1  . potassium chloride (MICRO-K) 10 MEQ CR capsule TAKE TWO CAPSULES BY MOUTH ONCE DAILY 180 capsule 1  . predniSONE (DELTASONE) 20 MG tablet Take 3 tablets (60 mg total) by mouth daily. Take on days 2-5 of chemotherapy. 36 tablet 0  . prochlorperazine (COMPAZINE) 10 MG tablet Take 1 tablet (10 mg total) by mouth every 6 (six) hours as needed (Nausea or vomiting). 30 tablet 6  . tiotropium (SPIRIVA) 18 MCG inhalation capsule Place 18 mcg into inhaler and inhale daily.    . verapamil (CALAN-SR) 240 MG CR tablet TAKE ONE TABLET BY MOUTH ONCE DAILY AFTER A MEAL FOR BLOOD PRESSURE 90 tablet 0   No current facility-administered medications for this visit.     PHYSICAL EXAMINATION: ECOG PERFORMANCE STATUS: 0 - Asymptomatic  Vitals:   04/24/16 0913  BP: (!) 116/51  Pulse: (!) 101  Resp: 18  Temp: 98.2 F (36.8 C)   Filed Weights   04/24/16 0913  Weight: 116 lb (52.6 kg)    GENERAL:alert, no distress and comfortable SKIN: skin color, texture, turgor are normal, no rashes or significant lesions EYES: normal, Conjunctiva are pink and non-injected, sclera clear OROPHARYNX:no exudate, no erythema and lips, buccal mucosa, and tongue normal  NECK: supple, thyroid normal size, non-tender, without nodularity LYMPH:  no palpable lymphadenopathy in the cervical, axillary or inguinal LUNGS: clear to auscultation and percussion with normal breathing effort HEART: regular rate & rhythm and no murmurs and no lower extremity edema ABDOMEN:abdomen soft, non-tender and normal bowel sounds Musculoskeletal:no cyanosis of digits and no clubbing  NEURO: alert & oriented x 3 with fluent speech, no focal motor/sensory deficits  LABORATORY DATA:  I have reviewed the data as listed    Component Value Date/Time   NA 143 04/24/2016 0859   K 3.7 04/24/2016 0859   CL 100 01/23/2016 1436    CO2 28 04/24/2016 0859   GLUCOSE 94 04/24/2016 0859   BUN 9.5 04/24/2016 0859   CREATININE 0.8 04/24/2016 0859   CALCIUM 9.3 04/24/2016 0859   PROT 6.1 (L) 04/24/2016 0859   ALBUMIN 3.2 (L) 04/24/2016 0859   AST 12 04/24/2016 0859   ALT 11 04/24/2016 0859   ALKPHOS 95 04/24/2016 0859   BILITOT 0.33 04/24/2016 0859   GFRNONAA 53 (L) 01/23/2016 1436   GFRAA 62 01/23/2016 1436    No results found for: SPEP, UPEP  Lab Results  Component Value Date   WBC 8.5 04/24/2016   NEUTROABS 6.7 (H) 04/24/2016   HGB 10.0 (L) 04/24/2016   HCT 31.6 (L) 04/24/2016   MCV 98.8 04/24/2016   PLT 347 04/24/2016      Chemistry      Component Value Date/Time   NA 143 04/24/2016 0859   K 3.7 04/24/2016 0859   CL 100 01/23/2016 1436   CO2 28 04/24/2016 0859   BUN 9.5 04/24/2016 0859   CREATININE 0.8 04/24/2016 0859      Component Value Date/Time   CALCIUM 9.3 04/24/2016 0859   ALKPHOS 95 04/24/2016 0859   AST 12 04/24/2016 0859   ALT 11 04/24/2016 0859   BILITOT 0.33 04/24/2016 0859  ASSESSMENT & PLAN:  Follicular lymphoma grade iiia, lymph nodes of head, face, and neck ([place]) She tolerated treatment well with expected side effects. I will continue treatment without dose adjustment. Plan would be to complete 3 cycles of treatment and the repeat imaging study in February 2018 We discussed briefly about the role of maintenance treatment with immunotherapy  Anemia due to antineoplastic chemotherapy This is likely due to recent treatment. The patient denies recent history of bleeding such as epistaxis, hematuria or hematochezia. She is asymptomatic from the anemia. I will observe for now.  She does not require transfusion now. I will continue the chemotherapy at current dose without dosage adjustment.  If the anemia gets progressive worse in the future, I might have to delay her treatment or adjust the chemotherapy dose.    Orders Placed This Encounter  Procedures  . NM PET Image  Restag (PS) Skull Base To Thigh    Standing Status:   Future    Standing Expiration Date:   05/29/2017    Order Specific Question:   Reason for exam:    Answer:   staging lymphoma, assess response to Rx    Order Specific Question:   Preferred imaging location?    Answer:   [facility]   All questions were answered. The patient knows to call the clinic with any problems, questions or concerns. No barriers to learning was detected. I spent 15 minutes counseling the patient face to face. The total time spent in the appointment was 20 minutes and more than 50% was on counseling and review of test results     Heath Lark, MD 04/24/2016 10:06 AM

## 2016-04-24 NOTE — Assessment & Plan Note (Signed)

## 2016-04-25 ENCOUNTER — Ambulatory Visit (HOSPITAL_BASED_OUTPATIENT_CLINIC_OR_DEPARTMENT_OTHER): Payer: Medicare Other

## 2016-04-25 ENCOUNTER — Other Ambulatory Visit: Payer: Self-pay | Admitting: Oncology

## 2016-04-25 VITALS — BP 126/54 | HR 101 | Temp 97.8°F | Resp 17

## 2016-04-25 DIAGNOSIS — C8231 Follicular lymphoma grade IIIa, lymph nodes of head, face, and neck: Secondary | ICD-10-CM | POA: Diagnosis present

## 2016-04-25 MED ORDER — PEGFILGRASTIM INJECTION 6 MG/0.6ML ~~LOC~~
6.0000 mg | PREFILLED_SYRINGE | Freq: Once | SUBCUTANEOUS | Status: AC
  Administered 2016-04-25: 6 mg via SUBCUTANEOUS

## 2016-04-25 NOTE — Patient Instructions (Signed)
Pegfilgrastim injection What is this medicine? PEGFILGRASTIM (PEG fil gra stim) is a long-acting granulocyte colony-stimulating factor that stimulates the growth of neutrophils, a type of white blood cell important in the body's fight against infection. It is used to reduce the incidence of fever and infection in patients with certain types of cancer who are receiving chemotherapy that affects the bone marrow, and to increase survival after being exposed to high doses of radiation. This medicine may be used for other purposes; ask your health care provider or pharmacist if you have questions. COMMON BRAND NAME(S): Neulasta What should I tell my health care provider before I take this medicine? They need to know if you have any of these conditions: -kidney disease -latex allergy -ongoing radiation therapy -sickle cell disease -skin reactions to acrylic adhesives (On-Body Injector only) -an unusual or allergic reaction to pegfilgrastim, filgrastim, other medicines, foods, dyes, or preservatives -pregnant or trying to get pregnant -breast-feeding How should I use this medicine? This medicine is for injection under the skin. If you get this medicine at home, you will be taught how to prepare and give the pre-filled syringe or how to use the On-body Injector. Refer to the patient Instructions for Use for detailed instructions. Use exactly as directed. Take your medicine at regular intervals. Do not take your medicine more often than directed. It is important that you put your used needles and syringes in a special sharps container. Do not put them in a trash can. If you do not have a sharps container, call your pharmacist or healthcare provider to get one. Talk to your pediatrician regarding the use of this medicine in children. While this drug may be prescribed for selected conditions, precautions do apply. Overdosage: If you think you have taken too much of this medicine contact a poison control  center or emergency room at once. NOTE: This medicine is only for you. Do not share this medicine with others. What if I miss a dose? It is important not to miss your dose. Call your doctor or health care professional if you miss your dose. If you miss a dose due to an On-body Injector failure or leakage, a new dose should be administered as soon as possible using a single prefilled syringe for manual use. What may interact with this medicine? Interactions have not been studied. Give your health care provider a list of all the medicines, herbs, non-prescription drugs, or dietary supplements you use. Also tell them if you smoke, drink alcohol, or use illegal drugs. Some items may interact with your medicine. This list may not describe all possible interactions. Give your health care provider a list of all the medicines, herbs, non-prescription drugs, or dietary supplements you use. Also tell them if you smoke, drink alcohol, or use illegal drugs. Some items may interact with your medicine. What should I watch for while using this medicine? You may need blood work done while you are taking this medicine. If you are going to need a MRI, CT scan, or other procedure, tell your doctor that you are using this medicine (On-Body Injector only). What side effects may I notice from receiving this medicine? Side effects that you should report to your doctor or health care professional as soon as possible: -allergic reactions like skin rash, itching or hives, swelling of the face, lips, or tongue -dizziness -fever -pain, redness, or irritation at site where injected -pinpoint red spots on the skin -red or dark-brown urine -shortness of breath or breathing problems -stomach or   side pain, or pain at the shoulder -swelling -tiredness -trouble passing urine or change in the amount of urine Side effects that usually do not require medical attention (report to your doctor or health care professional if they  continue or are bothersome): -bone pain -muscle pain This list may not describe all possible side effects. Call your doctor for medical advice about side effects. You may report side effects to FDA at 1-800-FDA-1088. Where should I keep my medicine? Keep out of the reach of children. Store pre-filled syringes in a refrigerator between 2 and 8 degrees C (36 and 46 degrees F). Do not freeze. Keep in carton to protect from light. Throw away this medicine if it is left out of the refrigerator for more than 48 hours. Throw away any unused medicine after the expiration date. NOTE: This sheet is a summary. It may not cover all possible information. If you have questions about this medicine, talk to your doctor, pharmacist, or health care provider.  2017 Elsevier/Gold Standard (2014-04-12 14:30:14)  

## 2016-04-27 ENCOUNTER — Ambulatory Visit: Payer: Self-pay | Admitting: Physician Assistant

## 2016-05-08 ENCOUNTER — Telehealth: Payer: Self-pay | Admitting: Hematology and Oncology

## 2016-05-08 NOTE — Telephone Encounter (Signed)
lvm to inform pt of added lab appt to 2/21 at 1045 am prior to PET scan per LOS

## 2016-05-25 ENCOUNTER — Other Ambulatory Visit: Payer: Self-pay | Admitting: Internal Medicine

## 2016-05-25 DIAGNOSIS — M549 Dorsalgia, unspecified: Secondary | ICD-10-CM

## 2016-05-27 ENCOUNTER — Other Ambulatory Visit (HOSPITAL_BASED_OUTPATIENT_CLINIC_OR_DEPARTMENT_OTHER): Payer: Medicare Other

## 2016-05-27 ENCOUNTER — Encounter (HOSPITAL_COMMUNITY)
Admission: RE | Admit: 2016-05-27 | Discharge: 2016-05-27 | Disposition: A | Discharge status: Home / Self Care | Payer: Medicare Other | Source: Ambulatory Visit | Attending: Hematology and Oncology | Admitting: Hematology and Oncology

## 2016-05-27 DIAGNOSIS — C8231 Follicular lymphoma grade IIIa, lymph nodes of head, face, and neck: Secondary | ICD-10-CM

## 2016-05-27 DIAGNOSIS — C8290 Follicular lymphoma, unspecified, unspecified site: Secondary | ICD-10-CM | POA: Diagnosis not present

## 2016-05-27 LAB — CBC WITH DIFFERENTIAL/PLATELET
BASO%: 1.5 % (ref 0.0–2.0)
BASOS ABS: 0.1 10*3/uL (ref 0.0–0.1)
EOS%: 4.6 % (ref 0.0–7.0)
Eosinophils Absolute: 0.3 10*3/uL (ref 0.0–0.5)
HEMATOCRIT: 32.3 % — AB (ref 34.8–46.6)
HGB: 10.6 g/dL — ABNORMAL LOW (ref 11.6–15.9)
LYMPH%: 7.7 % — ABNORMAL LOW (ref 14.0–49.7)
MCH: 32 pg (ref 25.1–34.0)
MCHC: 32.7 g/dL (ref 31.5–36.0)
MCV: 97.7 fL (ref 79.5–101.0)
MONO#: 0.5 10*3/uL (ref 0.1–0.9)
MONO%: 7.6 % (ref 0.0–14.0)
NEUT#: 5.1 10*3/uL (ref 1.5–6.5)
NEUT%: 78.6 % — AB (ref 38.4–76.8)
PLATELETS: 180 10*3/uL (ref 145–400)
RBC: 3.31 10*6/uL — ABNORMAL LOW (ref 3.70–5.45)
RDW: 16.5 % — ABNORMAL HIGH (ref 11.2–14.5)
WBC: 6.4 10*3/uL (ref 3.9–10.3)
lymph#: 0.5 10*3/uL — ABNORMAL LOW (ref 0.9–3.3)

## 2016-05-27 LAB — COMPREHENSIVE METABOLIC PANEL
ALT: 10 U/L (ref 0–55)
ANION GAP: 11 meq/L (ref 3–11)
AST: 16 U/L (ref 5–34)
Albumin: 3.6 g/dL (ref 3.5–5.0)
Alkaline Phosphatase: 83 U/L (ref 40–150)
BILIRUBIN TOTAL: 0.39 mg/dL (ref 0.20–1.20)
BUN: 8.2 mg/dL (ref 7.0–26.0)
CALCIUM: 9.4 mg/dL (ref 8.4–10.4)
CHLORIDE: 102 meq/L (ref 98–109)
CO2: 32 mEq/L — ABNORMAL HIGH (ref 22–29)
CREATININE: 0.8 mg/dL (ref 0.6–1.1)
EGFR: 76 mL/min/{1.73_m2} — ABNORMAL LOW (ref >90)
Glucose: 89 mg/dl (ref 70–140)
Potassium: 3.1 mEq/L — ABNORMAL LOW (ref 3.5–5.1)
Sodium: 145 mEq/L (ref 136–145)
Total Protein: 6.3 g/dL — ABNORMAL LOW (ref 6.4–8.3)

## 2016-05-27 LAB — GLUCOSE, CAPILLARY: GLUCOSE-CAPILLARY: 95 mg/dL (ref 65–99)

## 2016-05-27 MED ORDER — FLUDEOXYGLUCOSE F - 18 (FDG) INJECTION
5.4000 | Freq: Once | INTRAVENOUS | Status: AC | PRN
  Administered 2016-05-27: 5.4 via INTRAVENOUS

## 2016-05-28 ENCOUNTER — Ambulatory Visit (HOSPITAL_BASED_OUTPATIENT_CLINIC_OR_DEPARTMENT_OTHER): Payer: Medicare Other | Admitting: Hematology and Oncology

## 2016-05-28 ENCOUNTER — Encounter: Payer: Self-pay | Admitting: Hematology and Oncology

## 2016-05-28 ENCOUNTER — Telehealth: Payer: Self-pay | Admitting: Hematology and Oncology

## 2016-05-28 DIAGNOSIS — T451X5A Adverse effect of antineoplastic and immunosuppressive drugs, initial encounter: Secondary | ICD-10-CM

## 2016-05-28 DIAGNOSIS — D6481 Anemia due to antineoplastic chemotherapy: Secondary | ICD-10-CM

## 2016-05-28 DIAGNOSIS — E876 Hypokalemia: Secondary | ICD-10-CM | POA: Insufficient documentation

## 2016-05-28 DIAGNOSIS — C8231 Follicular lymphoma grade IIIa, lymph nodes of head, face, and neck: Secondary | ICD-10-CM | POA: Diagnosis not present

## 2016-05-28 NOTE — Assessment & Plan Note (Signed)
She has hypokalemia of unknown etiology. We discussed potassium rich diet. She is taking potassium replacement therapy

## 2016-05-28 NOTE — Progress Notes (Signed)
[facility] OFFICE PROGRESS NOTE  Patient Care Team: Unk Pinto, MD as PCP - General (Internal Medicine) Lafayette Dragon, MD (Inactive) as Consulting Physician (Gastroenterology) Loletha Carrow, MD (Pulmonary Disease) Calvert Cantor, MD as Consulting Physician (Ophthalmology) Heath Lark, MD as Consulting Physician (Hematology and Oncology) Rozetta Nunnery, MD as Consulting Physician (Otolaryngology)  SUMMARY OF ONCOLOGIC HISTORY:   Follicular lymphoma grade iiia, lymph nodes of head, face, and neck ([place])   01/29/2016 Imaging    Ct neck showed right submandibular lymphadenopathy and asymmetric prominence of jugular chain nodes. Metastatic disease is the primary diagnostic concern and submandibular node sampling is recommended. No primary lesion is seen, limited at the oral cavity due to dental artifact. 2. Emphysema      02/04/2016 Pathology Results    Accession: [id number] Biopsy showed high grade follicular lymphoma      02/24/2016 PET scan    Hypermetabolic right submandibular and right level II/III lymph nodes, consistent with the given history of lymphoma. No hypermetabolic lymph nodes in the chest, abdomen or pelvis. Aortic atherosclerosis (ICD10-170.0). Coronary artery calcification. Emphysema (ICD10-J43.9).      02/24/2016 Imaging    ECHO showed LV The cavity size was normal. Systolic function was   normal. The estimated ejection fraction was in the range of 55% to 60%. Wall motion was normal; there were no regional wall motion abnormalities. Doppler parameters are consistent with abnormal left ventricular relaxation (grade 1 diastolic dysfunction).      03/13/2016 - 04/24/2016 Chemotherapy    The patient had R-CHOP chemo x 3 cycles      05/27/2016 PET scan    No metabolic activity associated with the RIGHT submandibular gland or RIGHT cervical lymph nodes consistent with a complete response. 2. Bilateral pulmonary nodules are not changed from comparison exam.  3. Diffuse mild esophagus metabolic activity suggests mild esophagitis.       INTERVAL HISTORY: Please see below for problem oriented charting. She returns with her friend for further follow-up. She feels well. She complained of mild sore throat sensation. Denies recent chest pain, shortness of breath.  She has chronic reflux disease. No recent lymphadenopathy  REVIEW OF SYSTEMS:   Constitutional: Denies fevers, chills or abnormal weight loss Eyes: Denies blurriness of vision Ears, nose, mouth, throat, and face: Denies mucositis or sore throat Respiratory: Denies cough, dyspnea or wheezes Cardiovascular: Denies palpitation, chest discomfort or lower extremity swelling Skin: Denies abnormal skin rashes Lymphatics: Denies new lymphadenopathy or easy bruising Neurological:Denies numbness, tingling or new weaknesses Behavioral/Psych: Mood is stable, no new changes  All other systems were reviewed with the patient and are negative.  I have reviewed the past medical history, past surgical history, social history and family history with the patient and they are unchanged from previous note.  ALLERGIES:  has No Known Allergies.  MEDICATIONS:  Current Outpatient Prescriptions  Medication Sig Dispense Refill  . alendronate (FOSAMAX) 70 MG tablet Take 1 tablet weekly with a full glass of water for 30 minutes on an empty stomach 12 tablet 3  . atorvastatin (LIPITOR) 80 MG tablet TAKE ONE TABLET BY MOUTH ONCE DAILY FOR CHOLESTEROL 90 tablet 1  . Cholecalciferol 4000 UNITS CAPS Take 1 capsule by mouth daily.    . Ferrous Sulfate Dried (SLOW RELEASE IRON) 45 MG TBCR Take by mouth daily.    Marland Kitchen gabapentin (NEURONTIN) 300 MG capsule TAKE ONE CAPSULE BY MOUTH THREE TIMES DAILY TO 4 TIMES DAILY FOR PAIN 360 capsule 1  . Magnesium 300  MG CAPS Take 1 capsule by mouth 3 (three) times daily.     Marland Kitchen omeprazole (PRILOSEC) 40 MG capsule Take 1 capsule daily for acid reflux 90 capsule 1  . potassium  chloride (MICRO-K) 10 MEQ CR capsule TAKE TWO CAPSULES BY MOUTH ONCE DAILY 180 capsule 1  . tiotropium (SPIRIVA) 18 MCG inhalation capsule Place 18 mcg into inhaler and inhale daily.    . Tiotropium Bromide-Olodaterol (STIOLTO RESPIMAT) 2.5-2.5 MCG/ACT AERS INHALE TWO PUFFS BY MOUTH  DAILY    . verapamil (CALAN-SR) 240 MG CR tablet TAKE ONE TABLET BY MOUTH ONCE DAILY AFTER A MEAL FOR BLOOD PRESSURE 90 tablet 0   No current facility-administered medications for this visit.     PHYSICAL EXAMINATION: ECOG PERFORMANCE STATUS: 0 - Asymptomatic  Vitals:   05/28/16 1408  BP: 125/60  Pulse: 86  Resp: 18  Temp: 97.7 F (36.5 C)   Filed Weights   05/28/16 1408  Weight: 112 lb 4.8 oz (50.9 kg)    GENERAL:alert, no distress and comfortable SKIN: skin color, texture, turgor are normal, no rashes or significant lesions EYES: normal, Conjunctiva are pink and non-injected, sclera clear OROPHARYNX:no exudate, no erythema and lips, buccal mucosa, and tongue normal  NECK: supple, thyroid normal size, non-tender, without nodularity LYMPH:  no palpable lymphadenopathy in the cervical, axillary or inguinal LUNGS: clear to auscultation and percussion with normal breathing effort HEART: regular rate & rhythm and no murmurs and no lower extremity edema ABDOMEN:abdomen soft, non-tender and normal bowel sounds Musculoskeletal:no cyanosis of digits and no clubbing  NEURO: alert & oriented x 3 with fluent speech, no focal motor/sensory deficits  LABORATORY DATA:  I have reviewed the data as listed    Component Value Date/Time   NA 145 05/27/2016 1000   K 3.1 (L) 05/27/2016 1000   CL 100 01/23/2016 1436   CO2 32 (H) 05/27/2016 1000   GLUCOSE 89 05/27/2016 1000   BUN 8.2 05/27/2016 1000   CREATININE 0.8 05/27/2016 1000   CALCIUM 9.4 05/27/2016 1000   PROT 6.3 (L) 05/27/2016 1000   ALBUMIN 3.6 05/27/2016 1000   AST 16 05/27/2016 1000   ALT 10 05/27/2016 1000   ALKPHOS 83 05/27/2016 1000    BILITOT 0.39 05/27/2016 1000   GFRNONAA 53 (L) 01/23/2016 1436   GFRAA 62 01/23/2016 1436    No results found for: SPEP, UPEP  Lab Results  Component Value Date   WBC 6.4 05/27/2016   NEUTROABS 5.1 05/27/2016   HGB 10.6 (L) 05/27/2016   HCT 32.3 (L) 05/27/2016   MCV 97.7 05/27/2016   PLT 180 05/27/2016      Chemistry      Component Value Date/Time   NA 145 05/27/2016 1000   K 3.1 (L) 05/27/2016 1000   CL 100 01/23/2016 1436   CO2 32 (H) 05/27/2016 1000   BUN 8.2 05/27/2016 1000   CREATININE 0.8 05/27/2016 1000      Component Value Date/Time   CALCIUM 9.4 05/27/2016 1000   ALKPHOS 83 05/27/2016 1000   AST 16 05/27/2016 1000   ALT 10 05/27/2016 1000   BILITOT 0.39 05/27/2016 1000       RADIOGRAPHIC STUDIES: I have personally reviewed the radiological images as listed and agreed with the findings in the report. Nm Pet Image Restag (ps) Skull Base To Thigh  Result Date: 05/27/2016 CLINICAL DATA:  Subsequent treatment strategy for follicular lymphoma. Patient status post R-CHOP chemotherapy. EXAM: NUCLEAR MEDICINE PET SKULL BASE TO THIGH TECHNIQUE: 5.4 mCi F-18  FDG was injected intravenously. Full-ring PET imaging was performed from the skull base to thigh after the radiotracer. CT data was obtained and used for attenuation correction and anatomic localization. FASTING BLOOD GLUCOSE:  Value: 95 mg/dl COMPARISON:  PET-CT 02/24/2016 FINDINGS: NECK Resolution of the hypermetabolic activity in the RIGHT submandibular gland. Resolution of hypermetabolic activity in the RIGHT level II lymph nodes. No hypermetabolic lymph nodes remain in neck. Incidental note of asymmetric activity within the vocal cords,RIGHT greater than LEFT. No clear etiology. CHEST No hypermetabolic mediastinal or hilar nodes. Severe emphysematous change the upper lobes. Several small sub 5 mm subpleural nodules are unchanged. Larger nodule LEFT lower lobe measuring 6 mm (image 58 series 2) is unchanged. Mild  diffuse activity through the esophagus suggests esophagitis. ABDOMEN/PELVIS No abnormal hypermetabolic activity within the liver, pancreas, adrenal glands, or spleen. No hypermetabolic lymph nodes in the abdomen or pelvis. Gallstones noted. Atherosclerotic calcification aorta noted. Uterus and ovaries normal. Mild diffuse activity in the esophagus suggests mild esophagitis. SKELETON No focal hypermetabolic activity to suggest skeletal metastasis. IMPRESSION: 1. No metabolic activity associated with the RIGHT submandibular gland or RIGHT cervical lymph nodes consistent with a complete response. 2. Bilateral pulmonary nodules are not changed from comparison exam. 3. Diffuse mild esophagus metabolic activity suggests mild esophagitis. Electronically Signed   By: Suzy Bouchard M.D.   On: 05/27/2016 15:58    ASSESSMENT & PLAN:  Follicular lymphoma grade iiia, lymph nodes of head, face, and neck ([facility]) PET/CT scans show complete response to treatment. She is delighted. We reviewed the current guidelines. Even though she does not have high disease burden at presentation, the patient had high risk features due to grade 3 presentation. We discussed the algorithm for diffuse large B-cell lymphoma versus follicular lymphoma. We discussed observation versus radiation oncology consultation for adjuvant radiation therapy with involved field radiation treatment versus consolidation with rituximab agent. We discussed the risks, benefits and side effects of each option.  Ultimately, the patient would like to proceed with consolidation treatment with maintenance rituximab every 60 days for 2 years.  Anemia due to antineoplastic chemotherapy This is likely due to recent treatment. The patient denies recent history of bleeding such as epistaxis, hematuria or hematochezia. She is asymptomatic from the anemia. I will observe for now.    Hypokalemia She has hypokalemia of unknown etiology. We discussed potassium rich  diet. She is taking potassium replacement therapy   No orders of the defined types were placed in this encounter.  All questions were answered. The patient knows to call the clinic with any problems, questions or concerns. No barriers to learning was detected. I spent 25 minutes counseling the patient face to face. The total time spent in the appointment was 40 minutes and more than 50% was on counseling and review of test results     Heath Lark, MD 05/28/2016 2:51 PM

## 2016-05-28 NOTE — Progress Notes (Signed)
DISCONTINUE ON PATHWAY REGIMEN - Lymphoma and CLL     A cycle is every 21 days:     Rituximab        Dose Mod: None     Cyclophosphamide        Dose Mod: None     Doxorubicin        Dose Mod: None     Vincristine        Dose Mod: None     Prednisone        Dose Mod: None  **Always confirm dose/schedule in your pharmacy ordering system**    REASON: Continuation Of Treatment PRIOR TREATMENT: [id number]: R-CHOP q21 Days x 3 Cycles with IFRT After the Completion of 3 Cycles TREATMENT RESPONSE: Complete Response (CR)  START ON PATHWAY REGIMEN - Lymphoma and CLL     A cycle is every 2 months:     Rituximab        Dose Mod: None  **Always confirm dose/schedule in your pharmacy ordering system**    Patient Characteristics: Follicular, Grades 1, 2, and 3A, Second Line, Prior Treatment with Rituximab Alone, Relapse Less Than 24 Months Disease Type: Follicular Disease Type: Not Applicable [place] Stage: I Tumor Grade: 3A Line of therapy: Second Line Prior Treatment: Prior Treatment with Rituximab Alone Time to Relapse: Relapse < 24 Months  Intent of Therapy: Curative Intent, Discussed with Patient

## 2016-05-28 NOTE — Assessment & Plan Note (Signed)
This is likely due to recent treatment. The patient denies recent history of bleeding such as epistaxis, hematuria or hematochezia. She is asymptomatic from the anemia. I will observe for now.   

## 2016-05-28 NOTE — Telephone Encounter (Signed)
Lab, follow up and chemo scheduled per 05/28/16 los. Patient was given a copy of the AVS report and appointment schedule per 05/28/16 los.

## 2016-05-28 NOTE — Assessment & Plan Note (Signed)
PET/CT scans show complete response to treatment. She is delighted. We reviewed the current guidelines. Even though she does not have high disease burden at presentation, the patient had high risk features due to grade 3 presentation. We discussed the algorithm for diffuse large B-cell lymphoma versus follicular lymphoma. We discussed observation versus radiation oncology consultation for adjuvant radiation therapy with involved field radiation treatment versus consolidation with rituximab agent. We discussed the risks, benefits and side effects of each option.  Ultimately, the patient would like to proceed with consolidation treatment with maintenance rituximab every 60 days for 2 years.

## 2016-06-02 ENCOUNTER — Other Ambulatory Visit: Payer: Self-pay | Admitting: Hematology and Oncology

## 2016-06-02 ENCOUNTER — Telehealth: Payer: Self-pay | Admitting: *Deleted

## 2016-06-02 NOTE — Telephone Encounter (Signed)
FYI Immunoglobulin treatment for next week has not been approved.    "I'm scheduled for immunoglobulin to be given where they give chemotherapy Next week on March 9th.  I need to know if Medicare will cover this or will I.  Will appointment have to be rescheduled while we await approval.  Please call me either way at home number [phone]."   Patient notified approval pending.  Will notify Prior Authorization Specialist and provider.

## 2016-06-04 ENCOUNTER — Telehealth: Payer: Self-pay | Admitting: Hematology and Oncology

## 2016-06-04 NOTE — Telephone Encounter (Signed)
Received message to call patient to discuss authorization for treatment. No answer, left message.

## 2016-06-11 ENCOUNTER — Telehealth: Payer: Self-pay

## 2016-06-11 ENCOUNTER — Other Ambulatory Visit: Payer: Self-pay | Admitting: Hematology and Oncology

## 2016-06-11 ENCOUNTER — Telehealth: Payer: Self-pay | Admitting: *Deleted

## 2016-06-11 DIAGNOSIS — C8231 Follicular lymphoma grade IIIa, lymph nodes of head, face, and neck: Secondary | ICD-10-CM

## 2016-06-11 NOTE — Telephone Encounter (Signed)
Received call from patient asking if she needs a driver.  Advised she needs someone here or on hand to call if she has a reaction or allergic to tomorrow's treatment.  Discussed pre-medications, Rituxamab side effects.  Asked if Benadryl is repeated.  Repeated only if a reaction occurs as an emergemcy medication.  No further questions.  Asked that she provide nurse with name of back up if no one comes with her or drives her.

## 2016-06-11 NOTE — Telephone Encounter (Signed)
Patient called to see if "I  needed someone to come with me for my treatment tomorrow." I explained that she didn't anyone to come and stay with her. Explained that someone could drop her off and pick her up, no one had to stay during the treatment

## 2016-06-11 NOTE — Telephone Encounter (Signed)
lvm that pt will need a driver if she get very sleepy from the benadryl given during treatment for rituxan.

## 2016-06-12 ENCOUNTER — Ambulatory Visit (HOSPITAL_BASED_OUTPATIENT_CLINIC_OR_DEPARTMENT_OTHER): Payer: Medicare Other

## 2016-06-12 ENCOUNTER — Other Ambulatory Visit (HOSPITAL_BASED_OUTPATIENT_CLINIC_OR_DEPARTMENT_OTHER): Payer: Medicare Other

## 2016-06-12 VITALS — BP 113/55 | HR 99 | Temp 98.2°F | Resp 18

## 2016-06-12 DIAGNOSIS — Z5112 Encounter for antineoplastic immunotherapy: Secondary | ICD-10-CM | POA: Diagnosis present

## 2016-06-12 DIAGNOSIS — C8231 Follicular lymphoma grade IIIa, lymph nodes of head, face, and neck: Secondary | ICD-10-CM

## 2016-06-12 LAB — CBC WITH DIFFERENTIAL/PLATELET
BASO%: 0.7 % (ref 0.0–2.0)
Basophils Absolute: 0 10*3/uL (ref 0.0–0.1)
EOS ABS: 0.2 10*3/uL (ref 0.0–0.5)
EOS%: 4 % (ref 0.0–7.0)
HEMATOCRIT: 35.2 % (ref 34.8–46.6)
HEMOGLOBIN: 11.4 g/dL — AB (ref 11.6–15.9)
LYMPH#: 0.6 10*3/uL — AB (ref 0.9–3.3)
LYMPH%: 9.7 % — ABNORMAL LOW (ref 14.0–49.7)
MCH: 31.9 pg (ref 25.1–34.0)
MCHC: 32.5 g/dL (ref 31.5–36.0)
MCV: 98.1 fL (ref 79.5–101.0)
MONO#: 0.5 10*3/uL (ref 0.1–0.9)
MONO%: 7.7 % (ref 0.0–14.0)
NEUT#: 4.7 10*3/uL (ref 1.5–6.5)
NEUT%: 77.9 % — AB (ref 38.4–76.8)
PLATELETS: 166 10*3/uL (ref 145–400)
RBC: 3.59 10*6/uL — ABNORMAL LOW (ref 3.70–5.45)
RDW: 14.9 % — ABNORMAL HIGH (ref 11.2–14.5)
WBC: 6.1 10*3/uL (ref 3.9–10.3)

## 2016-06-12 LAB — COMPREHENSIVE METABOLIC PANEL
ALT: 10 U/L (ref 0–55)
AST: 13 U/L (ref 5–34)
Albumin: 3.8 g/dL (ref 3.5–5.0)
Alkaline Phosphatase: 99 U/L (ref 40–150)
Anion Gap: 11 mEq/L (ref 3–11)
BUN: 11.8 mg/dL (ref 7.0–26.0)
CHLORIDE: 105 meq/L (ref 98–109)
CO2: 29 meq/L (ref 22–29)
Calcium: 9.6 mg/dL (ref 8.4–10.4)
Creatinine: 0.8 mg/dL (ref 0.6–1.1)
EGFR: 78 mL/min/{1.73_m2} — AB (ref >90)
GLUCOSE: 81 mg/dL (ref 70–140)
POTASSIUM: 3.4 meq/L — AB (ref 3.5–5.1)
SODIUM: 145 meq/L (ref 136–145)
TOTAL PROTEIN: 6.5 g/dL (ref 6.4–8.3)
Total Bilirubin: 0.41 mg/dL (ref 0.20–1.20)

## 2016-06-12 MED ORDER — ACETAMINOPHEN 325 MG PO TABS
650.0000 mg | ORAL_TABLET | Freq: Once | ORAL | Status: AC
  Administered 2016-06-12: 650 mg via ORAL

## 2016-06-12 MED ORDER — DIPHENHYDRAMINE HCL 25 MG PO CAPS
ORAL_CAPSULE | ORAL | Status: AC
  Filled 2016-06-12: qty 2

## 2016-06-12 MED ORDER — SODIUM CHLORIDE 0.9 % IV SOLN
Freq: Once | INTRAVENOUS | Status: AC
  Administered 2016-06-12: 11:00:00 via INTRAVENOUS

## 2016-06-12 MED ORDER — DIPHENHYDRAMINE HCL 25 MG PO CAPS
50.0000 mg | ORAL_CAPSULE | Freq: Once | ORAL | Status: AC
  Administered 2016-06-12: 50 mg via ORAL

## 2016-06-12 MED ORDER — ACETAMINOPHEN 325 MG PO TABS
ORAL_TABLET | ORAL | Status: AC
  Filled 2016-06-12: qty 2

## 2016-06-12 MED ORDER — RITUXIMAB CHEMO INJECTION 500 MG/50ML
375.0000 mg/m2 | Freq: Once | INTRAVENOUS | Status: AC
  Administered 2016-06-12: 600 mg via INTRAVENOUS
  Filled 2016-06-12: qty 50

## 2016-06-12 NOTE — Patient Instructions (Signed)
[facility] Discharge Instructions for Patients Receiving Chemotherapy  Today you received the following chemotherapy agents: Rituxan   To help prevent nausea and vomiting after your treatment, we encourage you to take your nausea medication as directed.    If you develop nausea and vomiting that is not controlled by your nausea medication, call the clinic.   BELOW ARE SYMPTOMS THAT SHOULD BE REPORTED IMMEDIATELY:  *FEVER GREATER THAN 100.5 F  *CHILLS WITH OR WITHOUT FEVER  NAUSEA AND VOMITING THAT IS NOT CONTROLLED WITH YOUR NAUSEA MEDICATION  *UNUSUAL SHORTNESS OF BREATH  *UNUSUAL BRUISING OR BLEEDING  TENDERNESS IN MOUTH AND THROAT WITH OR WITHOUT PRESENCE OF ULCERS  *URINARY PROBLEMS  *BOWEL PROBLEMS  UNUSUAL RASH Items with * indicate a potential emergency and should be followed up as soon as possible.  Feel free to call the clinic you have any questions or concerns. The clinic phone number is [phone].  Please show the CHEMO ALERT CARD at check-in to the Emergency Department and triage nurse.   

## 2016-07-20 ENCOUNTER — Other Ambulatory Visit: Payer: Self-pay | Admitting: Internal Medicine

## 2016-07-20 DIAGNOSIS — K219 Gastro-esophageal reflux disease without esophagitis: Secondary | ICD-10-CM

## 2016-08-04 NOTE — Progress Notes (Signed)
3 month follow up.   Assessment:   Essential hypertension - continue medications, DASH diet, exercise and monitor at home. Call if greater than 130/80.  - CBC with Differential/Platelet - Hepatic function panel - TSH  OSA on CPAP Sleep apnea- continue CPAP  Pulmonary emphysema, unspecified emphysema type Follow up pulmonary, continue inhaler.   Lymphoma s/p chemo, doing very well, continue follow up  CKD (chronic kidney disease) stage 2, GFR 60-89 ml/min Increase fluids, avoid NSAIDS, monitor sugars, will monitor - BASIC METABOLIC PANEL WITH GFR  Hyperlipidemia -continue medications, check lipids, decrease fatty foods, increase activity.  - Lipid panel  Vitamin D deficiency - Vit D  25 hydroxy (rtn osteoporosis monitoring)   Medication management - Magnesium  Future Appointments Date Time Provider [place]  08/11/2016 9:30 AM [facility] LAB 6 [facility] None  08/11/2016 10:00 AM Heath Lark, MD [facility] None  08/11/2016 11:00 AM [facility] I26 DNS [facility] None  02/22/2017 2:00 PM Vicie Mutters, PA-C [id number] None    Subjective:   Renee Hawkins is a 76 y.o. female who presents for 3 month follow up.     Her blood pressure has been controlled at home, today their BP is BP: 120/80 She does workout.   She denies chest pain, shortness of breath, dizziness.  She was recently treated for follicular lymphoma, following with oncology, clear PET scan on 05/2016.  She has COPD with OSA, on CPAP and spiriva, well controlled. Follows with Dr. Welford Roche.  She is on cholesterol medication, lipitor 80mg  1/2 pill 3 days a week and denies myalgias. Her cholesterol is at goal. The cholesterol last visit was:   Lab Results  Component Value Date   CHOL 224 (H) 01/23/2016   HDL 43 (L) 01/23/2016   LDLCALC 137 (H) 01/23/2016   TRIG 219 (H) 01/23/2016   CHOLHDL 5.2 (H) 01/23/2016   Lab Results  Component Value Date   GFRNONAA 53 (L) 01/23/2016   She has been  working on diet and exercise for prediabetes, and denies nausea, paresthesia of the feet and polydipsia. Last A1C in the office was:  Lab Results  Component Value Date   HGBA1C 5.3 01/23/2016   Patient is on Vitamin D supplement.   Lab Results  Component Value Date   VD25OH 67 10/23/2015   BMI is Body mass index is 20.44 kg/m., she is working on diet and exercise. Wt Readings from Last 3 Encounters:  08/05/16 115 lb 6.4 oz (52.3 kg)  05/28/16 112 lb 4.8 oz (50.9 kg)  04/24/16 116 lb (52.6 kg)    Medication Review Current Outpatient Prescriptions on File Prior to Visit  Medication Sig Dispense Refill  . alendronate (FOSAMAX) 70 MG tablet Take 1 tablet weekly with a full glass of water for 30 minutes on an empty stomach 12 tablet 3  . atorvastatin (LIPITOR) 80 MG tablet TAKE ONE TABLET BY MOUTH ONCE DAILY FOR CHOLESTEROL 90 tablet 1  . Cholecalciferol 4000 UNITS CAPS Take 1 capsule by mouth daily.    . Ferrous Sulfate Dried (SLOW RELEASE IRON) 45 MG TBCR Take by mouth daily.    Marland Kitchen gabapentin (NEURONTIN) 300 MG capsule TAKE ONE CAPSULE BY MOUTH THREE TIMES DAILY TO 4 TIMES DAILY FOR PAIN 360 capsule 1  . Magnesium 300 MG CAPS Take 1 capsule by mouth 3 (three) times daily.     Marland Kitchen omeprazole (PRILOSEC) 40 MG capsule TAKE ONE CAPSULE BY MOUTH ONCE DAILY FOR  ACID  REFLUX 90 capsule 1  . potassium  chloride (MICRO-K) 10 MEQ CR capsule TAKE TWO CAPSULES BY MOUTH ONCE DAILY 180 capsule 1  . tiotropium (SPIRIVA) 18 MCG inhalation capsule Place 18 mcg into inhaler and inhale daily.    . Tiotropium Bromide-Olodaterol (STIOLTO RESPIMAT) 2.5-2.5 MCG/ACT AERS INHALE TWO PUFFS BY MOUTH  DAILY    . verapamil (CALAN-SR) 240 MG CR tablet TAKE ONE TABLET BY MOUTH ONCE DAILY AFTER A MEAL FOR BLOOD PRESSURE 90 tablet 0  . [DISCONTINUED] prochlorperazine (COMPAZINE) 10 MG tablet Take 1 tablet (10 mg total) by mouth every 6 (six) hours as needed (Nausea or vomiting). 30 tablet 6   No current  facility-administered medications on file prior to visit.     Current Problems (verified) Patient Active Problem List   Diagnosis Date Noted  . Hypokalemia 05/28/2016  . Anemia due to antineoplastic chemotherapy 04/03/2016  . Nausea and vomiting 03/13/2016  . Dehydration 03/12/2016  . Follicular lymphoma grade iiia, lymph nodes of head, face, and neck ([place]) 02/11/2016  . Dyspnea 02/11/2016  . Encounter for antineoplastic chemotherapy 02/11/2016  . OSA on CPAP 12/05/2014  . Osteoporosis 12/05/2014  . Medication management 03/06/2014  . CKD (chronic kidney disease) stage 2, GFR 60-89 ml/min 11/27/2013  . COPD (chronic obstructive pulmonary disease) with emphysema ([place]) 11/27/2013  . Hypertension   . Diverticulosis of colon with hemorrhage   . GERD    . Hx of adenomatous colonic polyps   . Hyperlipidemia   . Prediabetes   . Vitamin D deficiency   . RLS (restless legs syndrome)   . Vitamin B12 deficiency   . Anxiety     Allergies No Known Allergies  Review of Systems  Constitutional: Negative.   HENT: Negative.        + Snoring, better on CPAP  Respiratory: Positive for shortness of breath (improved with CPAP, not using O2 anymore). Negative for cough, hemoptysis, sputum production and wheezing.   Cardiovascular: Negative.   Gastrointestinal: Negative for abdominal pain, blood in stool, constipation, diarrhea, heartburn, melena, nausea and vomiting.  Genitourinary: Negative for dysuria, flank pain, frequency, hematuria and urgency.  Musculoskeletal: Negative.  Negative for falls.  Skin: Positive for rash (on scalp).  Neurological: Positive for sensory change (bilateral legs). Negative for dizziness, tingling, tremors, speech change, focal weakness, seizures and loss of consciousness.  Psychiatric/Behavioral: Negative.  Negative for depression.    Objective:     Blood pressure 120/80, pulse 89, temperature 97.7 F (36.5 C), resp. rate 14, height 5\' 3"  (1.6 m), weight 115  lb 6.4 oz (52.3 kg), SpO2 99 %. Body mass index is 20.44 kg/m.  General appearance: alert, no distress, WD/WN,  female HEENT: normocephalic, sclerae anicteric, TMs pearly, nares patent, no discharge or erythema, pharynx normal Oral cavity: MMM, no lesions Neck: supple , no thyromegaly, no masses Heart: RRR, normal S1, S2, no murmurs Lungs: CTA bilaterally, no wheezes, rhonchi, or rales Abdomen: +bs, soft, diffuse tenderness, no rebound, distended, no masses, no hepatomegaly, no splenomegaly Musculoskeletal: nontender, no swelling, no obvious deformity Extremities: no edema, no cyanosis, no clubbing Pulses: 2+ symmetric, upper and lower extremities, normal cap refill Neurological: alert, oriented x 3, CN2-12 intact, strength normal upper extremities and lower extremities, sensation normal throughout, DTRs 2+ throughout, no cerebellar signs, gait normal Psychiatric: normal affect, behavior normal, pleasant  Skin: several thickened, yellowish nodules on scalp, flaky, no redness, warmth, discharge.      Vicie Mutters, PA-C   08/05/2016

## 2016-08-05 ENCOUNTER — Ambulatory Visit (INDEPENDENT_AMBULATORY_CARE_PROVIDER_SITE_OTHER): Payer: Medicare Other | Admitting: Physician Assistant

## 2016-08-05 ENCOUNTER — Encounter: Payer: Self-pay | Admitting: Physician Assistant

## 2016-08-05 VITALS — BP 120/80 | HR 89 | Temp 97.7°F | Resp 14 | Ht 63.0 in | Wt 115.4 lb

## 2016-08-05 DIAGNOSIS — C8231 Follicular lymphoma grade IIIa, lymph nodes of head, face, and neck: Secondary | ICD-10-CM | POA: Diagnosis not present

## 2016-08-05 DIAGNOSIS — J439 Emphysema, unspecified: Secondary | ICD-10-CM | POA: Diagnosis not present

## 2016-08-05 DIAGNOSIS — N182 Chronic kidney disease, stage 2 (mild): Secondary | ICD-10-CM | POA: Diagnosis not present

## 2016-08-05 DIAGNOSIS — R7303 Prediabetes: Secondary | ICD-10-CM | POA: Diagnosis not present

## 2016-08-05 DIAGNOSIS — E559 Vitamin D deficiency, unspecified: Secondary | ICD-10-CM | POA: Diagnosis not present

## 2016-08-05 DIAGNOSIS — L219 Seborrheic dermatitis, unspecified: Secondary | ICD-10-CM | POA: Diagnosis not present

## 2016-08-05 DIAGNOSIS — I10 Essential (primary) hypertension: Secondary | ICD-10-CM | POA: Diagnosis not present

## 2016-08-05 DIAGNOSIS — Z79899 Other long term (current) drug therapy: Secondary | ICD-10-CM | POA: Diagnosis not present

## 2016-08-05 DIAGNOSIS — E785 Hyperlipidemia, unspecified: Secondary | ICD-10-CM

## 2016-08-05 LAB — BASIC METABOLIC PANEL WITH GFR
BUN: 12 mg/dL (ref 7–25)
CALCIUM: 9.2 mg/dL (ref 8.6–10.4)
CO2: 29 mmol/L (ref 20–31)
CREATININE: 0.82 mg/dL (ref 0.60–0.93)
Chloride: 100 mmol/L (ref 98–110)
GFR, EST AFRICAN AMERICAN: 82 mL/min (ref >=60)
GFR, Est Non African American: 71 mL/min (ref >=60)
Glucose, Bld: 78 mg/dL (ref 65–99)
Potassium: 4.4 mmol/L (ref 3.5–5.3)
SODIUM: 140 mmol/L (ref 135–146)

## 2016-08-05 LAB — CBC WITH DIFFERENTIAL/PLATELET
BASOS ABS: 38 {cells}/uL (ref 0–200)
Basophils Relative: 1 %
EOS PCT: 2 %
Eosinophils Absolute: 76 cells/uL (ref 15–500)
HCT: 39.3 % (ref 35.0–45.0)
Hemoglobin: 12.9 g/dL (ref 11.7–15.5)
LYMPHS PCT: 23 %
Lymphs Abs: 874 cells/uL (ref 850–3900)
MCH: 30.3 pg (ref 27.0–33.0)
MCHC: 32.8 g/dL (ref 32.0–36.0)
MCV: 92.3 fL (ref 80.0–100.0)
MONOS PCT: 11 %
MPV: 9.9 fL (ref 7.5–12.5)
Monocytes Absolute: 418 cells/uL (ref 200–950)
NEUTROS ABS: 2394 {cells}/uL (ref 1500–7800)
NEUTROS PCT: 63 %
PLATELETS: 175 10*3/uL (ref 140–400)
RBC: 4.26 MIL/uL (ref 3.80–5.10)
RDW: 13.8 % (ref 11.0–15.0)
WBC: 3.8 10*3/uL (ref 3.8–10.8)

## 2016-08-05 LAB — HEPATIC FUNCTION PANEL
ALBUMIN: 4 g/dL (ref 3.6–5.1)
ALT: 9 U/L (ref 6–29)
AST: 15 U/L (ref 10–35)
Alkaline Phosphatase: 77 U/L (ref 33–130)
BILIRUBIN DIRECT: 0 mg/dL (ref <=0.2)
Indirect Bilirubin: 0.3 mg/dL (ref 0.2–1.2)
Total Bilirubin: 0.3 mg/dL (ref 0.2–1.2)
Total Protein: 6.5 g/dL (ref 6.1–8.1)

## 2016-08-05 LAB — LIPID PANEL
CHOL/HDL RATIO: 6 ratio — AB (ref <5.0)
CHOLESTEROL: 244 mg/dL — AB (ref <200)
HDL: 41 mg/dL — AB (ref >50)
LDL Cholesterol: 146 mg/dL — ABNORMAL HIGH (ref <100)
Triglycerides: 287 mg/dL — ABNORMAL HIGH (ref <150)
VLDL: 57 mg/dL — ABNORMAL HIGH (ref <30)

## 2016-08-05 LAB — TSH: TSH: 1.94 m[IU]/L

## 2016-08-05 MED ORDER — CLOBETASOL PROPIONATE 0.05 % EX SOLN: 1.0000 "application " | Freq: Two times a day (BID) | CUTANEOUS | 0 refills | Status: DC

## 2016-08-05 NOTE — Patient Instructions (Signed)
Seborrheic Dermatitis, Adult Seborrheic dermatitis is a skin disease that causes red, scaly patches. It usually occurs on the scalp, and it is often called dandruff. The patches may appear on other parts of the body. Skin patches tend to appear where there are many oil glands in the skin. Areas of the body that are commonly affected include:  Scalp.  Skin folds of the body.  Ears.  Eyebrows.  Neck.  Face.  Armpits.  The bearded area of men's faces. The condition may come and go for no known reason, and it is often long-lasting (chronic). What are the causes? The cause of this condition is not known. What increases the risk? This condition is more likely to develop in people who:  Have certain conditions, such as:  HIV (human immunodeficiency virus).  AIDS (acquired immunodeficiency syndrome).  Parkinson disease.  Mood disorders, such as depression.  Are 93-2 years old. What are the signs or symptoms? Symptoms of this condition include:  Thick scales on the scalp.  Redness on the face or in the armpits.  Skin that is flaky. The flakes may be white or yellow.  Skin that seems oily or dry but is not helped with moisturizers.  Itching or burning in the affected areas. How is this diagnosed? This condition is diagnosed with a medical history and physical exam. A sample of your skin may be tested (skin biopsy). You may need to see a skin specialist (dermatologist). How is this treated? There is no cure for this condition, but treatment can help to manage the symptoms. You may get treatment to remove scales, lower the risk of skin infection, and reduce swelling or itching. Treatment may include:  Creams that reduce swelling and irritation (steroids).  Creams that reduce skin yeast.  Medicated shampoo, soaps, moisturizing creams, or ointments.  Medicated moisturizing creams or ointments. Follow these instructions at home:  Apply over-the-counter and prescription  medicines only as told by your health care provider.  Use any medicated shampoo, soaps, skin creams, or ointments only as told by your health care provider.  Keep all follow-up visits as told by your health care provider. This is important. Contact a health care provider if:  Your symptoms do not improve with treatment.  Your symptoms get worse.  You have new symptoms. This information is not intended to replace advice given to you by your health care provider. Make sure you discuss any questions you have with your health care provider. Document Released: 03/23/2005 Document Revised: 10/11/2015 Document Reviewed: 07/11/2015 Elsevier Interactive Patient Education  2017 [employer].

## 2016-08-06 LAB — MAGNESIUM: MAGNESIUM: 1.8 mg/dL (ref 1.5–2.5)

## 2016-08-06 LAB — VITAMIN D 25 HYDROXY (VIT D DEFICIENCY, FRACTURES): VIT D 25 HYDROXY: 115 ng/mL — AB (ref 30–100)

## 2016-08-11 ENCOUNTER — Ambulatory Visit (HOSPITAL_BASED_OUTPATIENT_CLINIC_OR_DEPARTMENT_OTHER): Payer: Medicare Other | Admitting: Hematology and Oncology

## 2016-08-11 ENCOUNTER — Other Ambulatory Visit (HOSPITAL_BASED_OUTPATIENT_CLINIC_OR_DEPARTMENT_OTHER): Payer: Medicare Other

## 2016-08-11 ENCOUNTER — Ambulatory Visit (HOSPITAL_BASED_OUTPATIENT_CLINIC_OR_DEPARTMENT_OTHER): Payer: Medicare Other

## 2016-08-11 ENCOUNTER — Telehealth: Payer: Self-pay | Admitting: Hematology and Oncology

## 2016-08-11 ENCOUNTER — Encounter: Payer: Self-pay | Admitting: Hematology and Oncology

## 2016-08-11 VITALS — BP 119/67 | HR 79 | Temp 97.9°F | Resp 20

## 2016-08-11 DIAGNOSIS — C8231 Follicular lymphoma grade IIIa, lymph nodes of head, face, and neck: Secondary | ICD-10-CM

## 2016-08-11 DIAGNOSIS — E785 Hyperlipidemia, unspecified: Secondary | ICD-10-CM | POA: Diagnosis not present

## 2016-08-11 DIAGNOSIS — Z5112 Encounter for antineoplastic immunotherapy: Secondary | ICD-10-CM

## 2016-08-11 LAB — COMPREHENSIVE METABOLIC PANEL
ALBUMIN: 3.9 g/dL (ref 3.5–5.0)
ALK PHOS: 98 U/L (ref 40–150)
ALT: 12 U/L (ref 0–55)
ANION GAP: 11 meq/L (ref 3–11)
AST: 16 U/L (ref 5–34)
BILIRUBIN TOTAL: 0.28 mg/dL (ref 0.20–1.20)
BUN: 12.5 mg/dL (ref 7.0–26.0)
CO2: 28 mEq/L (ref 22–29)
Calcium: 9.2 mg/dL (ref 8.4–10.4)
Chloride: 103 mEq/L (ref 98–109)
Creatinine: 0.8 mg/dL (ref 0.6–1.1)
EGFR: 75 mL/min/{1.73_m2} — AB (ref >90)
Glucose: 76 mg/dl (ref 70–140)
POTASSIUM: 3.8 meq/L (ref 3.5–5.1)
Sodium: 141 mEq/L (ref 136–145)
TOTAL PROTEIN: 6.7 g/dL (ref 6.4–8.3)

## 2016-08-11 LAB — CBC WITH DIFFERENTIAL/PLATELET
BASO%: 0.5 % (ref 0.0–2.0)
BASOS ABS: 0 10*3/uL (ref 0.0–0.1)
EOS%: 1.4 % (ref 0.0–7.0)
Eosinophils Absolute: 0.1 10*3/uL (ref 0.0–0.5)
HEMATOCRIT: 38.1 % (ref 34.8–46.6)
HGB: 12.9 g/dL (ref 11.6–15.9)
LYMPH#: 0.9 10*3/uL (ref 0.9–3.3)
LYMPH%: 17.3 % (ref 14.0–49.7)
MCH: 31.1 pg (ref 25.1–34.0)
MCHC: 33.8 g/dL (ref 31.5–36.0)
MCV: 92.1 fL (ref 79.5–101.0)
MONO#: 0.4 10*3/uL (ref 0.1–0.9)
MONO%: 8.2 % (ref 0.0–14.0)
NEUT#: 3.6 10*3/uL (ref 1.5–6.5)
NEUT%: 72.6 % (ref 38.4–76.8)
PLATELETS: 149 10*3/uL (ref 145–400)
RBC: 4.14 10*6/uL (ref 3.70–5.45)
RDW: 13.8 % (ref 11.2–14.5)
WBC: 5 10*3/uL (ref 3.9–10.3)

## 2016-08-11 MED ORDER — DIPHENHYDRAMINE HCL 25 MG PO CAPS
25.0000 mg | ORAL_CAPSULE | Freq: Once | ORAL | Status: AC
  Administered 2016-08-11: 25 mg via ORAL

## 2016-08-11 MED ORDER — ACETAMINOPHEN 325 MG PO TABS
ORAL_TABLET | ORAL | Status: AC
  Filled 2016-08-11: qty 2

## 2016-08-11 MED ORDER — SODIUM CHLORIDE 0.9 % IV SOLN
Freq: Once | INTRAVENOUS | Status: AC
  Administered 2016-08-11: 11:00:00 via INTRAVENOUS

## 2016-08-11 MED ORDER — SODIUM CHLORIDE 0.9 % IV SOLN
375.0000 mg/m2 | Freq: Once | INTRAVENOUS | Status: AC
  Administered 2016-08-11: 600 mg via INTRAVENOUS
  Filled 2016-08-11: qty 50

## 2016-08-11 MED ORDER — DIPHENHYDRAMINE HCL 25 MG PO CAPS
ORAL_CAPSULE | ORAL | Status: AC
Start: 2016-08-11 — End: 2016-08-11
  Filled 2016-08-11: qty 1

## 2016-08-11 MED ORDER — ACETAMINOPHEN 325 MG PO TABS
650.0000 mg | ORAL_TABLET | Freq: Once | ORAL | Status: AC
  Administered 2016-08-11: 650 mg via ORAL

## 2016-08-11 NOTE — Progress Notes (Signed)
[place] OFFICE PROGRESS NOTE  Patient Care Team: Unk Pinto, MD as PCP - General (Internal Medicine) Lafayette Dragon, MD (Inactive) as Consulting Physician (Gastroenterology) Loletha Carrow, MD (Pulmonary Disease) Calvert Cantor, MD as Consulting Physician (Ophthalmology) Heath Lark, MD as Consulting Physician (Hematology and Oncology) Rozetta Nunnery, MD as Consulting Physician (Otolaryngology)  SUMMARY OF ONCOLOGIC HISTORY:   Follicular lymphoma grade iiia, lymph nodes of head, face, and neck ([place])   01/29/2016 Imaging    Ct neck showed right submandibular lymphadenopathy and asymmetric prominence of jugular chain nodes. Metastatic disease is the primary diagnostic concern and submandibular node sampling is recommended. No primary lesion is seen, limited at the oral cavity due to dental artifact. 2. Emphysema      02/04/2016 Pathology Results    Accession: [id number] Biopsy showed high grade follicular lymphoma      02/24/2016 PET scan    Hypermetabolic right submandibular and right level II/III lymph nodes, consistent with the given history of lymphoma. No hypermetabolic lymph nodes in the chest, abdomen or pelvis. Aortic atherosclerosis (ICD10-170.0). Coronary artery calcification. Emphysema (ICD10-J43.9).      02/24/2016 Imaging    ECHO showed LV The cavity size was normal. Systolic function was   normal. The estimated ejection fraction was in the range of 55% to 60%. Wall motion was normal; there were no regional wall motion abnormalities. Doppler parameters are consistent with abnormal left ventricular relaxation (grade 1 diastolic dysfunction).      03/13/2016 - 04/24/2016 Chemotherapy    The patient had R-CHOP chemo x 3 cycles      05/27/2016 PET scan    No metabolic activity associated with the RIGHT submandibular gland or RIGHT cervical lymph nodes consistent with a complete response. 2. Bilateral pulmonary nodules are not changed from comparison  exam. 3. Diffuse mild esophagus metabolic activity suggests mild esophagitis.       INTERVAL HISTORY: Please see below for problem oriented charting. She returns for treatment #2 She feels well No recent infection Her hair has started to grow back No new lymphadenopathy  REVIEW OF SYSTEMS:   Constitutional: Denies fevers, chills or abnormal weight loss Eyes: Denies blurriness of vision Ears, nose, mouth, throat, and face: Denies mucositis or sore throat Respiratory: Denies cough, dyspnea or wheezes Cardiovascular: Denies palpitation, chest discomfort or lower extremity swelling Gastrointestinal:  Denies nausea, heartburn or change in bowel habits Skin: Denies abnormal skin rashes Lymphatics: Denies new lymphadenopathy or easy bruising Neurological:Denies numbness, tingling or new weaknesses Behavioral/Psych: Mood is stable, no new changes  All other systems were reviewed with the patient and are negative.  I have reviewed the past medical history, past surgical history, social history and family history with the patient and they are unchanged from previous note.  ALLERGIES:  has No Known Allergies.  MEDICATIONS:  Current Outpatient Prescriptions  Medication Sig Dispense Refill  . atorvastatin (LIPITOR) 80 MG tablet TAKE ONE TABLET BY MOUTH ONCE DAILY FOR CHOLESTEROL 90 tablet 1  . Cholecalciferol 4000 UNITS CAPS Take 1 capsule by mouth daily.    . clobetasol (TEMOVATE) 0.05 % external solution Apply 1 application topically 2 (two) times daily. 50 mL 0  . Ferrous Sulfate Dried (SLOW RELEASE IRON) 45 MG TBCR Take by mouth daily.    Marland Kitchen gabapentin (NEURONTIN) 300 MG capsule TAKE ONE CAPSULE BY MOUTH THREE TIMES DAILY TO 4 TIMES DAILY FOR PAIN 360 capsule 1  . Magnesium 300 MG CAPS Take 1 capsule by mouth 3 (three) times daily.     Marland Kitchen  omeprazole (PRILOSEC) 40 MG capsule TAKE ONE CAPSULE BY MOUTH ONCE DAILY FOR  ACID  REFLUX 90 capsule 1  . potassium chloride (MICRO-K) 10 MEQ CR  capsule TAKE TWO CAPSULES BY MOUTH ONCE DAILY 180 capsule 1  . tiotropium (SPIRIVA) 18 MCG inhalation capsule Place 18 mcg into inhaler and inhale daily.    . verapamil (CALAN-SR) 240 MG CR tablet TAKE ONE TABLET BY MOUTH ONCE DAILY AFTER A MEAL FOR BLOOD PRESSURE 90 tablet 0  . alendronate (FOSAMAX) 70 MG tablet Take 1 tablet weekly with a full glass of water for 30 minutes on an empty stomach (Patient not taking: Reported on 08/11/2016) 12 tablet 3   No current facility-administered medications for this visit.     PHYSICAL EXAMINATION: ECOG PERFORMANCE STATUS: 0 - Asymptomatic  Vitals:   08/11/16 0952  BP: 129/60  Pulse: 81  Resp: 18  Temp: 97.9 F (36.6 C)   Filed Weights   08/11/16 0952  Weight: 114 lb 4.8 oz (51.8 kg)    GENERAL:alert, no distress and comfortable SKIN: skin color, texture, turgor are normal, no rashes or significant lesions EYES: normal, Conjunctiva are pink and non-injected, sclera clear OROPHARYNX:no exudate, no erythema and lips, buccal mucosa, and tongue normal  NECK: supple, thyroid normal size, non-tender, without nodularity LYMPH:  no palpable lymphadenopathy in the cervical, axillary or inguinal LUNGS: clear to auscultation and percussion with normal breathing effort HEART: regular rate & rhythm and no murmurs and no lower extremity edema ABDOMEN:abdomen soft, non-tender and normal bowel sounds Musculoskeletal:no cyanosis of digits and no clubbing  NEURO: alert & oriented x 3 with fluent speech, no focal motor/sensory deficits  LABORATORY DATA:  I have reviewed the data as listed    Component Value Date/Time   NA 140 08/05/2016 1427   NA 145 06/12/2016 0912   K 4.4 08/05/2016 1427   K 3.4 (L) 06/12/2016 0912   CL 100 08/05/2016 1427   CO2 29 08/05/2016 1427   CO2 29 06/12/2016 0912   GLUCOSE 78 08/05/2016 1427   GLUCOSE 81 06/12/2016 0912   BUN 12 08/05/2016 1427   BUN 11.8 06/12/2016 0912   CREATININE 0.82 08/05/2016 1427   CREATININE  0.8 06/12/2016 0912   CALCIUM 9.2 08/05/2016 1427   CALCIUM 9.6 06/12/2016 0912   PROT 6.5 08/05/2016 1427   PROT 6.5 06/12/2016 0912   ALBUMIN 4.0 08/05/2016 1427   ALBUMIN 3.8 06/12/2016 0912   AST 15 08/05/2016 1427   AST 13 06/12/2016 0912   ALT 9 08/05/2016 1427   ALT 10 06/12/2016 0912   ALKPHOS 77 08/05/2016 1427   ALKPHOS 99 06/12/2016 0912   BILITOT 0.3 08/05/2016 1427   BILITOT 0.41 06/12/2016 0912   GFRNONAA 71 08/05/2016 1427   GFRAA 82 08/05/2016 1427    No results found for: SPEP, UPEP  Lab Results  Component Value Date   WBC 5.0 08/11/2016   NEUTROABS 3.6 08/11/2016   HGB 12.9 08/11/2016   HCT 38.1 08/11/2016   MCV 92.1 08/11/2016   PLT 149 08/11/2016      Chemistry      Component Value Date/Time   NA 140 08/05/2016 1427   NA 145 06/12/2016 0912   K 4.4 08/05/2016 1427   K 3.4 (L) 06/12/2016 0912   CL 100 08/05/2016 1427   CO2 29 08/05/2016 1427   CO2 29 06/12/2016 0912   BUN 12 08/05/2016 1427   BUN 11.8 06/12/2016 0912   CREATININE 0.82 08/05/2016 1427  CREATININE 0.8 06/12/2016 0912      Component Value Date/Time   CALCIUM 9.2 08/05/2016 1427   CALCIUM 9.6 06/12/2016 0912   ALKPHOS 77 08/05/2016 1427   ALKPHOS 99 06/12/2016 0912   AST 15 08/05/2016 1427   AST 13 06/12/2016 0912   ALT 9 08/05/2016 1427   ALT 10 06/12/2016 0912   BILITOT 0.3 08/05/2016 1427   BILITOT 0.41 06/12/2016 0912       ASSESSMENT & PLAN:  Follicular lymphoma grade iiia, lymph nodes of head, face, and neck ([facility]) PET/CT scans show complete response to treatment. We will proceed with consolidation treatment with maintenance rituximab every 60 days for 2 years.  Hyperlipidemia Recent blood work show high cholesterol She has not been contacted by her primary doctor I would defer to her primary doctor for further management   No orders of the defined types were placed in this encounter.  All questions were answered. The patient knows to call the clinic with  any problems, questions or concerns. No barriers to learning was detected. I spent 10 minutes counseling the patient face to face. The total time spent in the appointment was 15 minutes and more than 50% was on counseling and review of test results     Heath Lark, MD 08/11/2016 10:11 AM

## 2016-08-11 NOTE — Patient Instructions (Signed)
[facility] Discharge Instructions for Patients Receiving Chemotherapy  Today you received the following chemotherapy agents: Rituxan   To help prevent nausea and vomiting after your treatment, we encourage you to take your nausea medication as directed.    If you develop nausea and vomiting that is not controlled by your nausea medication, call the clinic.   BELOW ARE SYMPTOMS THAT SHOULD BE REPORTED IMMEDIATELY:  *FEVER GREATER THAN 100.5 F  *CHILLS WITH OR WITHOUT FEVER  NAUSEA AND VOMITING THAT IS NOT CONTROLLED WITH YOUR NAUSEA MEDICATION  *UNUSUAL SHORTNESS OF BREATH  *UNUSUAL BRUISING OR BLEEDING  TENDERNESS IN MOUTH AND THROAT WITH OR WITHOUT PRESENCE OF ULCERS  *URINARY PROBLEMS  *BOWEL PROBLEMS  UNUSUAL RASH Items with * indicate a potential emergency and should be followed up as soon as possible.  Feel free to call the clinic you have any questions or concerns. The clinic phone number is [phone].  Please show the CHEMO ALERT CARD at check-in to the Emergency Department and triage nurse.   

## 2016-08-11 NOTE — Assessment & Plan Note (Signed)
Recent blood work show high cholesterol She has not been contacted by her primary doctor I would defer to her primary doctor for further management

## 2016-08-11 NOTE — Telephone Encounter (Signed)
Appointments scheduled per 08/11/16 los. Patient was given a copy a copy of the AVS report and appointment schedule per 08/11/16 los.

## 2016-08-11 NOTE — Assessment & Plan Note (Signed)
PET/CT scans show complete response to treatment. We will proceed with consolidation treatment with maintenance rituximab every 60 days for 2 years.

## 2016-09-30 DIAGNOSIS — H02831 Dermatochalasis of right upper eyelid: Secondary | ICD-10-CM | POA: Diagnosis not present

## 2016-10-09 ENCOUNTER — Ambulatory Visit (HOSPITAL_BASED_OUTPATIENT_CLINIC_OR_DEPARTMENT_OTHER): Payer: Medicare Other

## 2016-10-09 ENCOUNTER — Other Ambulatory Visit (HOSPITAL_BASED_OUTPATIENT_CLINIC_OR_DEPARTMENT_OTHER): Payer: Medicare Other

## 2016-10-09 ENCOUNTER — Telehealth: Payer: Self-pay | Admitting: Hematology and Oncology

## 2016-10-09 ENCOUNTER — Ambulatory Visit (HOSPITAL_BASED_OUTPATIENT_CLINIC_OR_DEPARTMENT_OTHER): Payer: Medicare Other | Admitting: Hematology and Oncology

## 2016-10-09 VITALS — BP 111/68 | HR 70 | Temp 98.1°F | Resp 18

## 2016-10-09 DIAGNOSIS — Z5111 Encounter for antineoplastic chemotherapy: Secondary | ICD-10-CM | POA: Diagnosis present

## 2016-10-09 DIAGNOSIS — C8231 Follicular lymphoma grade IIIa, lymph nodes of head, face, and neck: Secondary | ICD-10-CM | POA: Diagnosis not present

## 2016-10-09 LAB — CBC WITH DIFFERENTIAL/PLATELET
BASO%: 0.5 % (ref 0.0–2.0)
Basophils Absolute: 0 10*3/uL (ref 0.0–0.1)
EOS%: 1.1 % (ref 0.0–7.0)
Eosinophils Absolute: 0.1 10*3/uL (ref 0.0–0.5)
HCT: 37 % (ref 34.8–46.6)
HGB: 12.4 g/dL (ref 11.6–15.9)
LYMPH%: 12.7 % — AB (ref 14.0–49.7)
MCH: 31.2 pg (ref 25.1–34.0)
MCHC: 33.4 g/dL (ref 31.5–36.0)
MCV: 93.3 fL (ref 79.5–101.0)
MONO#: 0.3 10*3/uL (ref 0.1–0.9)
MONO%: 6 % (ref 0.0–14.0)
NEUT#: 4.4 10*3/uL (ref 1.5–6.5)
NEUT%: 79.7 % — ABNORMAL HIGH (ref 38.4–76.8)
Platelets: 159 10*3/uL (ref 145–400)
RBC: 3.97 10*6/uL (ref 3.70–5.45)
RDW: 15 % — AB (ref 11.2–14.5)
WBC: 5.5 10*3/uL (ref 3.9–10.3)
lymph#: 0.7 10*3/uL — ABNORMAL LOW (ref 0.9–3.3)

## 2016-10-09 LAB — COMPREHENSIVE METABOLIC PANEL
ALT: 13 U/L (ref 0–55)
ANION GAP: 9 meq/L (ref 3–11)
AST: 17 U/L (ref 5–34)
Albumin: 3.7 g/dL (ref 3.5–5.0)
Alkaline Phosphatase: 95 U/L (ref 40–150)
BILIRUBIN TOTAL: 0.35 mg/dL (ref 0.20–1.20)
BUN: 12.5 mg/dL (ref 7.0–26.0)
CALCIUM: 9.5 mg/dL (ref 8.4–10.4)
CO2: 29 meq/L (ref 22–29)
CREATININE: 1 mg/dL (ref 0.6–1.1)
Chloride: 102 mEq/L (ref 98–109)
EGFR: 59 mL/min/{1.73_m2} — ABNORMAL LOW (ref >90)
Glucose: 92 mg/dl (ref 70–140)
Potassium: 3.9 mEq/L (ref 3.5–5.1)
Sodium: 140 mEq/L (ref 136–145)
TOTAL PROTEIN: 6.6 g/dL (ref 6.4–8.3)

## 2016-10-09 MED ORDER — DIPHENHYDRAMINE HCL 25 MG PO CAPS
ORAL_CAPSULE | ORAL | Status: AC
  Filled 2016-10-09: qty 1

## 2016-10-09 MED ORDER — ACETAMINOPHEN 325 MG PO TABS
ORAL_TABLET | ORAL | Status: AC
  Filled 2016-10-09: qty 2

## 2016-10-09 MED ORDER — DIPHENHYDRAMINE HCL 25 MG PO CAPS
25.0000 mg | ORAL_CAPSULE | Freq: Once | ORAL | Status: AC
  Administered 2016-10-09: 25 mg via ORAL

## 2016-10-09 MED ORDER — SODIUM CHLORIDE 0.9 % IV SOLN
375.0000 mg/m2 | Freq: Once | INTRAVENOUS | Status: AC
  Administered 2016-10-09: 600 mg via INTRAVENOUS
  Filled 2016-10-09: qty 10

## 2016-10-09 MED ORDER — SODIUM CHLORIDE 0.9 % IV SOLN
Freq: Once | INTRAVENOUS | Status: AC
  Administered 2016-10-09: 11:00:00 via INTRAVENOUS

## 2016-10-09 MED ORDER — ACETAMINOPHEN 325 MG PO TABS
650.0000 mg | ORAL_TABLET | Freq: Once | ORAL | Status: AC
  Administered 2016-10-09: 650 mg via ORAL

## 2016-10-09 NOTE — Telephone Encounter (Signed)
Scheduled appt per 7/6 los - Gave patient AVS and calender per los.  

## 2016-10-09 NOTE — Patient Instructions (Signed)
[facility] Discharge Instructions for Patients Receiving Chemotherapy  Today you received the following chemotherapy agents: Rituxan   To help prevent nausea and vomiting after your treatment, we encourage you to take your nausea medication as directed.    If you develop nausea and vomiting that is not controlled by your nausea medication, call the clinic.   BELOW ARE SYMPTOMS THAT SHOULD BE REPORTED IMMEDIATELY:  *FEVER GREATER THAN 100.5 F  *CHILLS WITH OR WITHOUT FEVER  NAUSEA AND VOMITING THAT IS NOT CONTROLLED WITH YOUR NAUSEA MEDICATION  *UNUSUAL SHORTNESS OF BREATH  *UNUSUAL BRUISING OR BLEEDING  TENDERNESS IN MOUTH AND THROAT WITH OR WITHOUT PRESENCE OF ULCERS  *URINARY PROBLEMS  *BOWEL PROBLEMS  UNUSUAL RASH Items with * indicate a potential emergency and should be followed up as soon as possible.  Feel free to call the clinic you have any questions or concerns. The clinic phone number is [phone].  Please show the CHEMO ALERT CARD at check-in to the Emergency Department and triage nurse.   

## 2016-10-10 ENCOUNTER — Encounter: Payer: Self-pay | Admitting: Hematology and Oncology

## 2016-10-10 NOTE — Progress Notes (Signed)
[place] OFFICE PROGRESS NOTE  Patient Care Team: Unk Pinto, MD as PCP - General (Internal Medicine) Lafayette Dragon, MD (Inactive) as Consulting Physician (Gastroenterology) Loletha Carrow, MD (Pulmonary Disease) Calvert Cantor, MD as Consulting Physician (Ophthalmology) Heath Lark, MD as Consulting Physician (Hematology and Oncology) Rozetta Nunnery, MD as Consulting Physician (Otolaryngology)  SUMMARY OF ONCOLOGIC HISTORY:   Follicular lymphoma grade iiia, lymph nodes of head, face, and neck ([place])   01/29/2016 Imaging    Ct neck showed right submandibular lymphadenopathy and asymmetric prominence of jugular chain nodes. Metastatic disease is the primary diagnostic concern and submandibular node sampling is recommended. No primary lesion is seen, limited at the oral cavity due to dental artifact. 2. Emphysema      02/04/2016 Pathology Results    Accession: [id number] Biopsy showed high grade follicular lymphoma      02/24/2016 PET scan    Hypermetabolic right submandibular and right level II/III lymph nodes, consistent with the given history of lymphoma. No hypermetabolic lymph nodes in the chest, abdomen or pelvis. Aortic atherosclerosis (ICD10-170.0). Coronary artery calcification. Emphysema (ICD10-J43.9).      02/24/2016 Imaging    ECHO showed LV The cavity size was normal. Systolic function was   normal. The estimated ejection fraction was in the range of 55% to 60%. Wall motion was normal; there were no regional wall motion abnormalities. Doppler parameters are consistent with abnormal left ventricular relaxation (grade 1 diastolic dysfunction).      03/13/2016 - 04/24/2016 Chemotherapy    The patient had R-CHOP chemo x 3 cycles      05/27/2016 PET scan    No metabolic activity associated with the RIGHT submandibular gland or RIGHT cervical lymph nodes consistent with a complete response. 2. Bilateral pulmonary nodules are not changed from comparison  exam. 3. Diffuse mild esophagus metabolic activity suggests mild esophagitis.       INTERVAL HISTORY: Please see below for problem oriented charting. She returns for further follow-up She denies recent problems No new lymphadenopathy Denies recent infection  REVIEW OF SYSTEMS:   Constitutional: Denies fevers, chills or abnormal weight loss Eyes: Denies blurriness of vision Ears, nose, mouth, throat, and face: Denies mucositis or sore throat Respiratory: Denies cough, dyspnea or wheezes Cardiovascular: Denies palpitation, chest discomfort or lower extremity swelling Gastrointestinal:  Denies nausea, heartburn or change in bowel habits Skin: Denies abnormal skin rashes Lymphatics: Denies new lymphadenopathy or easy bruising Neurological:Denies numbness, tingling or new weaknesses Behavioral/Psych: Mood is stable, no new changes  All other systems were reviewed with the patient and are negative.  I have reviewed the past medical history, past surgical history, social history and family history with the patient and they are unchanged from previous note.  ALLERGIES:  has No Known Allergies.  MEDICATIONS:  Current Outpatient Prescriptions  Medication Sig Dispense Refill  . alendronate (FOSAMAX) 70 MG tablet Take 1 tablet weekly with a full glass of water for 30 minutes on an empty stomach (Patient not taking: Reported on 08/11/2016) 12 tablet 3  . atorvastatin (LIPITOR) 80 MG tablet TAKE ONE TABLET BY MOUTH ONCE DAILY FOR CHOLESTEROL 90 tablet 1  . Cholecalciferol 4000 UNITS CAPS Take 1 capsule by mouth daily.    . clobetasol (TEMOVATE) 0.05 % external solution Apply 1 application topically 2 (two) times daily. 50 mL 0  . Ferrous Sulfate Dried (SLOW RELEASE IRON) 45 MG TBCR Take by mouth daily.    Marland Kitchen gabapentin (NEURONTIN) 300 MG capsule TAKE ONE CAPSULE BY MOUTH  THREE TIMES DAILY TO 4 TIMES DAILY FOR PAIN 360 capsule 1  . Magnesium 300 MG CAPS Take 1 capsule by mouth 3 (three) times  daily.     Marland Kitchen omeprazole (PRILOSEC) 40 MG capsule TAKE ONE CAPSULE BY MOUTH ONCE DAILY FOR  ACID  REFLUX 90 capsule 1  . potassium chloride (MICRO-K) 10 MEQ CR capsule TAKE TWO CAPSULES BY MOUTH ONCE DAILY 180 capsule 1  . tiotropium (SPIRIVA) 18 MCG inhalation capsule Place 18 mcg into inhaler and inhale daily.    . verapamil (CALAN-SR) 240 MG CR tablet TAKE ONE TABLET BY MOUTH ONCE DAILY AFTER A MEAL FOR BLOOD PRESSURE 90 tablet 0   No current facility-administered medications for this visit.     PHYSICAL EXAMINATION: ECOG PERFORMANCE STATUS: 0 - Asymptomatic  Vitals:   10/09/16 1001  BP: 125/75  Pulse: 85  Resp: 18  Temp: 98.1 F (36.7 C)   Filed Weights   10/09/16 1001  Weight: 117 lb 6.4 oz (53.3 kg)    GENERAL:alert, no distress and comfortable SKIN: skin color, texture, turgor are normal, no rashes or significant lesions EYES: normal, Conjunctiva are pink and non-injected, sclera clear OROPHARYNX:no exudate, no erythema and lips, buccal mucosa, and tongue normal  NECK: supple, thyroid normal size, non-tender, without nodularity LYMPH:  no palpable lymphadenopathy in the cervical, axillary or inguinal LUNGS: clear to auscultation and percussion with normal breathing effort HEART: regular rate & rhythm and no murmurs and no lower extremity edema ABDOMEN:abdomen soft, non-tender and normal bowel sounds Musculoskeletal:no cyanosis of digits and no clubbing  NEURO: alert & oriented x 3 with fluent speech, no focal motor/sensory deficits  LABORATORY DATA:  I have reviewed the data as listed    Component Value Date/Time   NA 140 10/09/2016 0856   K 3.9 10/09/2016 0856   CL 100 08/05/2016 1427   CO2 29 10/09/2016 0856   GLUCOSE 92 10/09/2016 0856   BUN 12.5 10/09/2016 0856   CREATININE 1.0 10/09/2016 0856   CALCIUM 9.5 10/09/2016 0856   PROT 6.6 10/09/2016 0856   ALBUMIN 3.7 10/09/2016 0856   AST 17 10/09/2016 0856   ALT 13 10/09/2016 0856   ALKPHOS 95 10/09/2016  0856   BILITOT 0.35 10/09/2016 0856   GFRNONAA 71 08/05/2016 1427   GFRAA 82 08/05/2016 1427    No results found for: SPEP, UPEP  Lab Results  Component Value Date   WBC 5.5 10/09/2016   NEUTROABS 4.4 10/09/2016   HGB 12.4 10/09/2016   HCT 37.0 10/09/2016   MCV 93.3 10/09/2016   PLT 159 10/09/2016      Chemistry      Component Value Date/Time   NA 140 10/09/2016 0856   K 3.9 10/09/2016 0856   CL 100 08/05/2016 1427   CO2 29 10/09/2016 0856   BUN 12.5 10/09/2016 0856   CREATININE 1.0 10/09/2016 0856      Component Value Date/Time   CALCIUM 9.5 10/09/2016 0856   ALKPHOS 95 10/09/2016 0856   AST 17 10/09/2016 0856   ALT 13 10/09/2016 0856   BILITOT 0.35 10/09/2016 0856       ASSESSMENT & PLAN:  Follicular lymphoma grade iiia, lymph nodes of head, face, and neck ([facility]) Recent PET/CT scan showed complete response to treatment. We will proceed with consolidation treatment with maintenance rituximab every 60 days for 2 years.   No orders of the defined types were placed in this encounter.  All questions were answered. The patient knows to call the  clinic with any problems, questions or concerns. No barriers to learning was detected. I spent 10 minutes counseling the patient face to face. The total time spent in the appointment was 15 minutes and more than 50% was on counseling and review of test results     Heath Lark, MD 10/10/2016 8:19 AM

## 2016-10-10 NOTE — Assessment & Plan Note (Signed)
Recent PET/CT scan showed complete response to treatment. We will proceed with consolidation treatment with maintenance rituximab every 60 days for 2 years.

## 2016-10-23 ENCOUNTER — Telehealth: Payer: Self-pay | Admitting: Hematology and Oncology

## 2016-10-23 NOTE — Telephone Encounter (Signed)
Patient requested to r/s appt - per Dr. Alvy Bimler said it was okay - appt r/s'd - left message to confirm with patient.

## 2016-11-23 ENCOUNTER — Other Ambulatory Visit: Payer: Self-pay | Admitting: Internal Medicine

## 2016-11-23 DIAGNOSIS — M549 Dorsalgia, unspecified: Secondary | ICD-10-CM

## 2016-12-08 ENCOUNTER — Other Ambulatory Visit: Payer: Medicare Other

## 2016-12-08 ENCOUNTER — Ambulatory Visit: Payer: Medicare Other | Admitting: Hematology and Oncology

## 2016-12-08 ENCOUNTER — Ambulatory Visit: Payer: Medicare Other

## 2016-12-15 ENCOUNTER — Telehealth: Payer: Self-pay | Admitting: Hematology and Oncology

## 2016-12-15 ENCOUNTER — Ambulatory Visit: Payer: Medicare Other

## 2016-12-15 ENCOUNTER — Ambulatory Visit (HOSPITAL_BASED_OUTPATIENT_CLINIC_OR_DEPARTMENT_OTHER): Payer: Medicare Other | Admitting: Hematology and Oncology

## 2016-12-15 ENCOUNTER — Ambulatory Visit (HOSPITAL_BASED_OUTPATIENT_CLINIC_OR_DEPARTMENT_OTHER): Payer: Medicare Other

## 2016-12-15 ENCOUNTER — Encounter: Payer: Self-pay | Admitting: Hematology and Oncology

## 2016-12-15 VITALS — BP 119/65 | HR 75 | Temp 98.2°F | Resp 18

## 2016-12-15 DIAGNOSIS — C8231 Follicular lymphoma grade IIIa, lymph nodes of head, face, and neck: Secondary | ICD-10-CM | POA: Diagnosis not present

## 2016-12-15 DIAGNOSIS — Z5112 Encounter for antineoplastic immunotherapy: Secondary | ICD-10-CM

## 2016-12-15 LAB — COMPREHENSIVE METABOLIC PANEL
ALBUMIN: 3.8 g/dL (ref 3.5–5.0)
ALK PHOS: 91 U/L (ref 40–150)
ALT: 11 U/L (ref 0–55)
AST: 14 U/L (ref 5–34)
Anion Gap: 8 mEq/L (ref 3–11)
BUN: 9.5 mg/dL (ref 7.0–26.0)
CHLORIDE: 104 meq/L (ref 98–109)
CO2: 30 meq/L — AB (ref 22–29)
Calcium: 9.6 mg/dL (ref 8.4–10.4)
Creatinine: 0.9 mg/dL (ref 0.6–1.1)
EGFR: 67 mL/min/{1.73_m2} — ABNORMAL LOW (ref >90)
GLUCOSE: 78 mg/dL (ref 70–140)
Potassium: 3.8 mEq/L (ref 3.5–5.1)
SODIUM: 143 meq/L (ref 136–145)
Total Bilirubin: 0.29 mg/dL (ref 0.20–1.20)
Total Protein: 6.8 g/dL (ref 6.4–8.3)

## 2016-12-15 LAB — CBC WITH DIFFERENTIAL/PLATELET
BASO%: 0.2 % (ref 0.0–2.0)
BASOS ABS: 0 10*3/uL (ref 0.0–0.1)
EOS ABS: 0.1 10*3/uL (ref 0.0–0.5)
EOS%: 1.1 % (ref 0.0–7.0)
HEMATOCRIT: 41 % (ref 34.8–46.6)
HGB: 13.1 g/dL (ref 11.6–15.9)
LYMPH#: 0.8 10*3/uL — AB (ref 0.9–3.3)
LYMPH%: 16.6 % (ref 14.0–49.7)
MCH: 31.5 pg (ref 25.1–34.0)
MCHC: 32 g/dL (ref 31.5–36.0)
MCV: 98.6 fL (ref 79.5–101.0)
MONO#: 0.3 10*3/uL (ref 0.1–0.9)
MONO%: 6.8 % (ref 0.0–14.0)
NEUT#: 3.6 10*3/uL (ref 1.5–6.5)
NEUT%: 75.3 % (ref 38.4–76.8)
PLATELETS: 171 10*3/uL (ref 145–400)
RBC: 4.16 10*6/uL (ref 3.70–5.45)
RDW: 13.6 % (ref 11.2–14.5)
WBC: 4.7 10*3/uL (ref 3.9–10.3)

## 2016-12-15 MED ORDER — RITUXIMAB CHEMO INJECTION 500 MG/50ML
375.0000 mg/m2 | Freq: Once | INTRAVENOUS | Status: AC
  Administered 2016-12-15: 600 mg via INTRAVENOUS
  Filled 2016-12-15: qty 50

## 2016-12-15 MED ORDER — ACETAMINOPHEN 325 MG PO TABS
ORAL_TABLET | ORAL | Status: AC
  Filled 2016-12-15: qty 2

## 2016-12-15 MED ORDER — ACETAMINOPHEN 325 MG PO TABS
650.0000 mg | ORAL_TABLET | Freq: Once | ORAL | Status: AC
  Administered 2016-12-15: 650 mg via ORAL

## 2016-12-15 MED ORDER — FAMOTIDINE IN NACL 20-0.9 MG/50ML-% IV SOLN
20.0000 mg | Freq: Once | INTRAVENOUS | Status: AC
  Administered 2016-12-15: 20 mg via INTRAVENOUS

## 2016-12-15 MED ORDER — SODIUM CHLORIDE 0.9 % IV SOLN
Freq: Once | INTRAVENOUS | Status: AC
  Administered 2016-12-15: 11:00:00 via INTRAVENOUS

## 2016-12-15 MED ORDER — FAMOTIDINE IN NACL 20-0.9 MG/50ML-% IV SOLN
INTRAVENOUS | Status: AC
  Filled 2016-12-15: qty 50

## 2016-12-15 NOTE — Assessment & Plan Note (Signed)
Recent PET/CT scan showed complete response to treatment. We will proceed with consolidation treatment with maintenance rituximab every 60 days for 2 years. I plan to see her again in 2 months, her next imaging would be due in February 2019

## 2016-12-15 NOTE — Progress Notes (Signed)
[place] OFFICE PROGRESS NOTE  Patient Care Team: Unk Pinto, MD as PCP - General (Internal Medicine) Lafayette Dragon, MD (Inactive) as Consulting Physician (Gastroenterology) Loletha Carrow, MD (Pulmonary Disease) Calvert Cantor, MD as Consulting Physician (Ophthalmology) Heath Lark, MD as Consulting Physician (Hematology and Oncology) Rozetta Nunnery, MD as Consulting Physician (Otolaryngology)  SUMMARY OF ONCOLOGIC HISTORY:   Follicular lymphoma grade iiia, lymph nodes of head, face, and neck ([place])   01/29/2016 Imaging    Ct neck showed right submandibular lymphadenopathy and asymmetric prominence of jugular chain nodes. Metastatic disease is the primary diagnostic concern and submandibular node sampling is recommended. No primary lesion is seen, limited at the oral cavity due to dental artifact. 2. Emphysema      02/04/2016 Pathology Results    Accession: [id number] Biopsy showed high grade follicular lymphoma      02/24/2016 PET scan    Hypermetabolic right submandibular and right level II/III lymph nodes, consistent with the given history of lymphoma. No hypermetabolic lymph nodes in the chest, abdomen or pelvis. Aortic atherosclerosis (ICD10-170.0). Coronary artery calcification. Emphysema (ICD10-J43.9).      02/24/2016 Imaging    ECHO showed LV The cavity size was normal. Systolic function was   normal. The estimated ejection fraction was in the range of 55% to 60%. Wall motion was normal; there were no regional wall motion abnormalities. Doppler parameters are consistent with abnormal left ventricular relaxation (grade 1 diastolic dysfunction).      03/13/2016 - 04/24/2016 Chemotherapy    The patient had R-CHOP chemo x 3 cycles      05/27/2016 PET scan    No metabolic activity associated with the RIGHT submandibular gland or RIGHT cervical lymph nodes consistent with a complete response. 2. Bilateral pulmonary nodules are not changed from comparison  exam. 3. Diffuse mild esophagus metabolic activity suggests mild esophagitis.       INTERVAL HISTORY: Please see below for problem oriented charting. She returns for further treatment She denies new lymphadenopathy No recent infection Appetite is stable, no recent weight loss  REVIEW OF SYSTEMS:   Constitutional: Denies fevers, chills or abnormal weight loss Eyes: Denies blurriness of vision Ears, nose, mouth, throat, and face: Denies mucositis or sore throat Respiratory: Denies cough, dyspnea or wheezes Cardiovascular: Denies palpitation, chest discomfort or lower extremity swelling Gastrointestinal:  Denies nausea, heartburn or change in bowel habits Skin: Denies abnormal skin rashes Lymphatics: Denies new lymphadenopathy or easy bruising Neurological:Denies numbness, tingling or new weaknesses Behavioral/Psych: Mood is stable, no new changes  All other systems were reviewed with the patient and are negative.  I have reviewed the past medical history, past surgical history, social history and family history with the patient and they are unchanged from previous note.  ALLERGIES:  has No Known Allergies.  MEDICATIONS:  Current Outpatient Prescriptions  Medication Sig Dispense Refill  . alendronate (FOSAMAX) 70 MG tablet Take 1 tablet weekly with a full glass of water for 30 minutes on an empty stomach (Patient not taking: Reported on 08/11/2016) 12 tablet 3  . atorvastatin (LIPITOR) 80 MG tablet TAKE ONE TABLET BY MOUTH ONCE DAILY FOR CHOLESTEROL 90 tablet 1  . Cholecalciferol 4000 UNITS CAPS Take 1 capsule by mouth daily.    . clobetasol (TEMOVATE) 0.05 % external solution Apply 1 application topically 2 (two) times daily. 50 mL 0  . Ferrous Sulfate Dried (SLOW RELEASE IRON) 45 MG TBCR Take by mouth daily.    Marland Kitchen gabapentin (NEURONTIN) 300 MG capsule TAKE  1 CAPSULE BY MOUTH 3 TO 4 TIMES DAILY FOR PAIN 360 capsule 1  . Magnesium 300 MG CAPS Take 1 capsule by mouth 3 (three) times  daily.     Marland Kitchen omeprazole (PRILOSEC) 40 MG capsule TAKE ONE CAPSULE BY MOUTH ONCE DAILY FOR  ACID  REFLUX 90 capsule 1  . potassium chloride (MICRO-K) 10 MEQ CR capsule TAKE TWO CAPSULES BY MOUTH ONCE DAILY 180 capsule 1  . tiotropium (SPIRIVA) 18 MCG inhalation capsule Place 18 mcg into inhaler and inhale daily.    . verapamil (CALAN-SR) 240 MG CR tablet TAKE ONE TABLET BY MOUTH ONCE DAILY AFTER A MEAL FOR BLOOD PRESSURE 90 tablet 0   No current facility-administered medications for this visit.     PHYSICAL EXAMINATION: ECOG PERFORMANCE STATUS: 0 - Asymptomatic  Vitals:   12/15/16 1015  BP: 132/67  Pulse: 84  Resp: 20  Temp: 98.1 F (36.7 C)  SpO2: 96%   Filed Weights   12/15/16 1015  Weight: 119 lb 12.8 oz (54.3 kg)    GENERAL:alert, no distress and comfortable SKIN: skin color, texture, turgor are normal, no rashes or significant lesions EYES: normal, Conjunctiva are pink and non-injected, sclera clear OROPHARYNX:no exudate, no erythema and lips, buccal mucosa, and tongue normal  NECK: supple, thyroid normal size, non-tender, without nodularity LYMPH:  no palpable lymphadenopathy in the cervical, axillary or inguinal LUNGS: clear to auscultation and percussion with normal breathing effort HEART: regular rate & rhythm and no murmurs and no lower extremity edema ABDOMEN:abdomen soft, non-tender and normal bowel sounds Musculoskeletal:no cyanosis of digits and no clubbing  NEURO: alert & oriented x 3 with fluent speech, no focal motor/sensory deficits  LABORATORY DATA:  I have reviewed the data as listed    Component Value Date/Time   NA 143 12/15/2016 0959   K 3.8 12/15/2016 0959   CL 100 08/05/2016 1427   CO2 30 (H) 12/15/2016 0959   GLUCOSE 78 12/15/2016 0959   BUN 9.5 12/15/2016 0959   CREATININE 0.9 12/15/2016 0959   CALCIUM 9.6 12/15/2016 0959   PROT 6.8 12/15/2016 0959   ALBUMIN 3.8 12/15/2016 0959   AST 14 12/15/2016 0959   ALT 11 12/15/2016 0959   ALKPHOS  91 12/15/2016 0959   BILITOT 0.29 12/15/2016 0959   GFRNONAA 71 08/05/2016 1427   GFRAA 82 08/05/2016 1427    No results found for: SPEP, UPEP  Lab Results  Component Value Date   WBC 4.7 12/15/2016   NEUTROABS 3.6 12/15/2016   HGB 13.1 12/15/2016   HCT 41.0 12/15/2016   MCV 98.6 12/15/2016   PLT 171 12/15/2016      Chemistry      Component Value Date/Time   NA 143 12/15/2016 0959   K 3.8 12/15/2016 0959   CL 100 08/05/2016 1427   CO2 30 (H) 12/15/2016 0959   BUN 9.5 12/15/2016 0959   CREATININE 0.9 12/15/2016 0959      Component Value Date/Time   CALCIUM 9.6 12/15/2016 0959   ALKPHOS 91 12/15/2016 0959   AST 14 12/15/2016 0959   ALT 11 12/15/2016 0959   BILITOT 0.29 12/15/2016 0959      ASSESSMENT & PLAN:  Follicular lymphoma grade iiia, lymph nodes of head, face, and neck ([facility]) Recent PET/CT scan showed complete response to treatment. We will proceed with consolidation treatment with maintenance rituximab every 60 days for 2 years. I plan to see her again in 2 months, her next imaging would be due in February 2019  No orders of the defined types were placed in this encounter.  All questions were answered. The patient knows to call the clinic with any problems, questions or concerns. No barriers to learning was detected. I spent 10 minutes counseling the patient face to face. The total time spent in the appointment was 15 minutes and more than 50% was on counseling and review of test results     Heath Lark, MD 12/15/2016 12:15 PM

## 2016-12-15 NOTE — Patient Instructions (Signed)
[facility] Discharge Instructions for Patients Receiving Chemotherapy  Today you received the following chemotherapy agents: Rituxan   To help prevent nausea and vomiting after your treatment, we encourage you to take your nausea medication as directed.    If you develop nausea and vomiting that is not controlled by your nausea medication, call the clinic.   BELOW ARE SYMPTOMS THAT SHOULD BE REPORTED IMMEDIATELY:  *FEVER GREATER THAN 100.5 F  *CHILLS WITH OR WITHOUT FEVER  NAUSEA AND VOMITING THAT IS NOT CONTROLLED WITH YOUR NAUSEA MEDICATION  *UNUSUAL SHORTNESS OF BREATH  *UNUSUAL BRUISING OR BLEEDING  TENDERNESS IN MOUTH AND THROAT WITH OR WITHOUT PRESENCE OF ULCERS  *URINARY PROBLEMS  *BOWEL PROBLEMS  UNUSUAL RASH Items with * indicate a potential emergency and should be followed up as soon as possible.  Feel free to call the clinic you have any questions or concerns. The clinic phone number is [phone].  Please show the CHEMO ALERT CARD at check-in to the Emergency Department and triage nurse.   

## 2016-12-15 NOTE — Telephone Encounter (Signed)
Gave patient AVS and calendar of upcoming November appointments.  °

## 2016-12-22 ENCOUNTER — Ambulatory Visit (INDEPENDENT_AMBULATORY_CARE_PROVIDER_SITE_OTHER): Payer: Medicare Other

## 2016-12-22 DIAGNOSIS — Z23 Encounter for immunization: Secondary | ICD-10-CM

## 2016-12-22 NOTE — Progress Notes (Signed)
Pt presents for HD flu w/o concerns or questions at this time. Pt was given flu in left arm w/o any issues

## 2017-02-12 ENCOUNTER — Ambulatory Visit (HOSPITAL_BASED_OUTPATIENT_CLINIC_OR_DEPARTMENT_OTHER): Payer: Medicare Other

## 2017-02-12 ENCOUNTER — Ambulatory Visit (HOSPITAL_BASED_OUTPATIENT_CLINIC_OR_DEPARTMENT_OTHER): Payer: Medicare Other | Admitting: Hematology and Oncology

## 2017-02-12 ENCOUNTER — Other Ambulatory Visit (HOSPITAL_BASED_OUTPATIENT_CLINIC_OR_DEPARTMENT_OTHER): Payer: Medicare Other

## 2017-02-12 ENCOUNTER — Encounter: Payer: Self-pay | Admitting: Hematology and Oncology

## 2017-02-12 ENCOUNTER — Telehealth: Payer: Self-pay | Admitting: Hematology and Oncology

## 2017-02-12 VITALS — BP 124/58 | HR 73 | Temp 97.7°F | Resp 18

## 2017-02-12 DIAGNOSIS — C8231 Follicular lymphoma grade IIIa, lymph nodes of head, face, and neck: Secondary | ICD-10-CM

## 2017-02-12 DIAGNOSIS — Z5112 Encounter for antineoplastic immunotherapy: Secondary | ICD-10-CM

## 2017-02-12 LAB — CBC WITH DIFFERENTIAL/PLATELET
BASO%: 0.7 % (ref 0.0–2.0)
Basophils Absolute: 0 10*3/uL (ref 0.0–0.1)
EOS ABS: 0.1 10*3/uL (ref 0.0–0.5)
EOS%: 1.3 % (ref 0.0–7.0)
HCT: 36.8 % (ref 34.8–46.6)
HEMOGLOBIN: 12.2 g/dL (ref 11.6–15.9)
LYMPH%: 15.1 % (ref 14.0–49.7)
MCH: 31.4 pg (ref 25.1–34.0)
MCHC: 33.1 g/dL (ref 31.5–36.0)
MCV: 95 fL (ref 79.5–101.0)
MONO#: 0.4 10*3/uL (ref 0.1–0.9)
MONO%: 8 % (ref 0.0–14.0)
NEUT%: 74.9 % (ref 38.4–76.8)
NEUTROS ABS: 3.6 10*3/uL (ref 1.5–6.5)
Platelets: 179 10*3/uL (ref 145–400)
RBC: 3.88 10*6/uL (ref 3.70–5.45)
RDW: 14.4 % (ref 11.2–14.5)
WBC: 4.8 10*3/uL (ref 3.9–10.3)
lymph#: 0.7 10*3/uL — ABNORMAL LOW (ref 0.9–3.3)

## 2017-02-12 LAB — COMPREHENSIVE METABOLIC PANEL
ALBUMIN: 3.8 g/dL (ref 3.5–5.0)
ALK PHOS: 88 U/L (ref 40–150)
ALT: 15 U/L (ref 0–55)
AST: 16 U/L (ref 5–34)
Anion Gap: 9 mEq/L (ref 3–11)
BILIRUBIN TOTAL: 0.4 mg/dL (ref 0.20–1.20)
BUN: 11.2 mg/dL (ref 7.0–26.0)
CO2: 28 meq/L (ref 22–29)
Calcium: 9.3 mg/dL (ref 8.4–10.4)
Chloride: 105 mEq/L (ref 98–109)
Creatinine: 0.9 mg/dL (ref 0.6–1.1)
EGFR: >60 mL/min/{1.73_m2} (ref >60)
GLUCOSE: 97 mg/dL (ref 70–140)
Potassium: 4 mEq/L (ref 3.5–5.1)
SODIUM: 142 meq/L (ref 136–145)
TOTAL PROTEIN: 6.7 g/dL (ref 6.4–8.3)

## 2017-02-12 MED ORDER — ACETAMINOPHEN 325 MG PO TABS
ORAL_TABLET | ORAL | Status: AC
  Filled 2017-02-12: qty 2

## 2017-02-12 MED ORDER — FAMOTIDINE IN NACL 20-0.9 MG/50ML-% IV SOLN
INTRAVENOUS | Status: AC
  Filled 2017-02-12: qty 50

## 2017-02-12 MED ORDER — SODIUM CHLORIDE 0.9 % IV SOLN
375.0000 mg/m2 | Freq: Once | INTRAVENOUS | Status: AC
  Administered 2017-02-12: 600 mg via INTRAVENOUS
  Filled 2017-02-12: qty 50

## 2017-02-12 MED ORDER — FAMOTIDINE IN NACL 20-0.9 MG/50ML-% IV SOLN
20.0000 mg | Freq: Once | INTRAVENOUS | Status: AC
  Administered 2017-02-12: 20 mg via INTRAVENOUS

## 2017-02-12 MED ORDER — ACETAMINOPHEN 325 MG PO TABS
650.0000 mg | ORAL_TABLET | Freq: Once | ORAL | Status: AC
  Administered 2017-02-12: 650 mg via ORAL

## 2017-02-12 MED ORDER — SODIUM CHLORIDE 0.9 % IV SOLN
Freq: Once | INTRAVENOUS | Status: AC
  Administered 2017-02-12: 10:00:00 via INTRAVENOUS

## 2017-02-12 NOTE — Progress Notes (Signed)
[place] OFFICE PROGRESS NOTE  Patient Care Team: Unk Pinto, MD as PCP - General (Internal Medicine) Lafayette Dragon, MD (Inactive) as Consulting Physician (Gastroenterology) Loletha Carrow, MD (Pulmonary Disease) Calvert Cantor, MD as Consulting Physician (Ophthalmology) Heath Lark, MD as Consulting Physician (Hematology and Oncology) Rozetta Nunnery, MD as Consulting Physician (Otolaryngology)  SUMMARY OF ONCOLOGIC HISTORY:   Follicular lymphoma grade iiia, lymph nodes of head, face, and neck ([place])   01/29/2016 Imaging    Ct neck showed right submandibular lymphadenopathy and asymmetric prominence of jugular chain nodes. Metastatic disease is the primary diagnostic concern and submandibular node sampling is recommended. No primary lesion is seen, limited at the oral cavity due to dental artifact. 2. Emphysema      02/04/2016 Pathology Results    Accession: [id number] Biopsy showed high grade follicular lymphoma      02/24/2016 PET scan    Hypermetabolic right submandibular and right level II/III lymph nodes, consistent with the given history of lymphoma. No hypermetabolic lymph nodes in the chest, abdomen or pelvis. Aortic atherosclerosis (ICD10-170.0). Coronary artery calcification. Emphysema (ICD10-J43.9).      02/24/2016 Imaging    ECHO showed LV The cavity size was normal. Systolic function was   normal. The estimated ejection fraction was in the range of 55% to 60%. Wall motion was normal; there were no regional wall motion abnormalities. Doppler parameters are consistent with abnormal left ventricular relaxation (grade 1 diastolic dysfunction).      03/13/2016 - 04/24/2016 Chemotherapy    The patient had R-CHOP chemo x 3 cycles      05/27/2016 PET scan    No metabolic activity associated with the RIGHT submandibular gland or RIGHT cervical lymph nodes consistent with a complete response. 2. Bilateral pulmonary nodules are not changed from comparison  exam. 3. Diffuse mild esophagus metabolic activity suggests mild esophagitis.       INTERVAL HISTORY: Please see below for problem oriented charting. She returns for treatment today She denies new lymphadenopathy No recent infection She denies infusion reactions  REVIEW OF SYSTEMS:   Constitutional: Denies fevers, chills or abnormal weight loss Eyes: Denies blurriness of vision Ears, nose, mouth, throat, and face: Denies mucositis or sore throat Respiratory: Denies cough, dyspnea or wheezes Cardiovascular: Denies palpitation, chest discomfort or lower extremity swelling Gastrointestinal:  Denies nausea, heartburn or change in bowel habits Skin: Denies abnormal skin rashes Lymphatics: Denies new lymphadenopathy or easy bruising Neurological:Denies numbness, tingling or new weaknesses Behavioral/Psych: Mood is stable, no new changes  All other systems were reviewed with the patient and are negative.  I have reviewed the past medical history, past surgical history, social history and family history with the patient and they are unchanged from previous note.  ALLERGIES:  has No Known Allergies.  MEDICATIONS:  Current Outpatient Medications  Medication Sig Dispense Refill  . alendronate (FOSAMAX) 70 MG tablet Take 1 tablet weekly with a full glass of water for 30 minutes on an empty stomach (Patient not taking: Reported on 08/11/2016) 12 tablet 3  . atorvastatin (LIPITOR) 80 MG tablet TAKE ONE TABLET BY MOUTH ONCE DAILY FOR CHOLESTEROL 90 tablet 1  . Cholecalciferol 4000 UNITS CAPS Take 1 capsule by mouth daily.    . clobetasol (TEMOVATE) 0.05 % external solution Apply 1 application topically 2 (two) times daily. 50 mL 0  . Ferrous Sulfate Dried (SLOW RELEASE IRON) 45 MG TBCR Take by mouth daily.    Marland Kitchen gabapentin (NEURONTIN) 300 MG capsule TAKE 1 CAPSULE BY  MOUTH 3 TO 4 TIMES DAILY FOR PAIN 360 capsule 1  . Magnesium 300 MG CAPS Take 1 capsule by mouth 3 (three) times daily.     Marland Kitchen  omeprazole (PRILOSEC) 40 MG capsule TAKE ONE CAPSULE BY MOUTH ONCE DAILY FOR  ACID  REFLUX 90 capsule 1  . potassium chloride (MICRO-K) 10 MEQ CR capsule TAKE TWO CAPSULES BY MOUTH ONCE DAILY 180 capsule 1  . tiotropium (SPIRIVA) 18 MCG inhalation capsule Place 18 mcg into inhaler and inhale daily.    . verapamil (CALAN-SR) 240 MG CR tablet TAKE ONE TABLET BY MOUTH ONCE DAILY AFTER A MEAL FOR BLOOD PRESSURE 90 tablet 0   No current facility-administered medications for this visit.     PHYSICAL EXAMINATION: ECOG PERFORMANCE STATUS: 0 - Asymptomatic  Vitals:   02/12/17 0851  BP: (!) 136/93  Pulse: 77  Resp: 18  Temp: 98.1 F (36.7 C)  SpO2: 100%   Filed Weights   02/12/17 0851  Weight: 122 lb 9.6 oz (55.6 kg)    GENERAL:alert, no distress and comfortable SKIN: skin color, texture, turgor are normal, no rashes or significant lesions EYES: normal, Conjunctiva are pink and non-injected, sclera clear OROPHARYNX:no exudate, no erythema and lips, buccal mucosa, and tongue normal  NECK: supple, thyroid normal size, non-tender, without nodularity LYMPH:  no palpable lymphadenopathy in the cervical, axillary or inguinal LUNGS: clear to auscultation and percussion with normal breathing effort HEART: regular rate & rhythm and no murmurs and no lower extremity edema ABDOMEN:abdomen soft, non-tender and normal bowel sounds Musculoskeletal:no cyanosis of digits and no clubbing  NEURO: alert & oriented x 3 with fluent speech, no focal motor/sensory deficits  LABORATORY DATA:  I have reviewed the data as listed    Component Value Date/Time   NA 142 02/12/2017 0809   K 4.0 02/12/2017 0809   CL 100 08/05/2016 1427   CO2 28 02/12/2017 0809   GLUCOSE 97 02/12/2017 0809   BUN 11.2 02/12/2017 0809   CREATININE 0.9 02/12/2017 0809   CALCIUM 9.3 02/12/2017 0809   PROT 6.7 02/12/2017 0809   ALBUMIN 3.8 02/12/2017 0809   AST 16 02/12/2017 0809   ALT 15 02/12/2017 0809   ALKPHOS 88 02/12/2017  0809   BILITOT 0.40 02/12/2017 0809   GFRNONAA 71 08/05/2016 1427   GFRAA 82 08/05/2016 1427    No results found for: SPEP, UPEP  Lab Results  Component Value Date   WBC 4.8 02/12/2017   NEUTROABS 3.6 02/12/2017   HGB 12.2 02/12/2017   HCT 36.8 02/12/2017   MCV 95.0 02/12/2017   PLT 179 02/12/2017      Chemistry      Component Value Date/Time   NA 142 02/12/2017 0809   K 4.0 02/12/2017 0809   CL 100 08/05/2016 1427   CO2 28 02/12/2017 0809   BUN 11.2 02/12/2017 0809   CREATININE 0.9 02/12/2017 0809      Component Value Date/Time   CALCIUM 9.3 02/12/2017 0809   ALKPHOS 88 02/12/2017 0809   AST 16 02/12/2017 0809   ALT 15 02/12/2017 0809   BILITOT 0.40 02/12/2017 0809       ASSESSMENT & PLAN:  Follicular lymphoma grade iiia, lymph nodes of head, face, and neck ([facility]) Recent PET/CT scan showed complete response to treatment. We will proceed with consolidation treatment with maintenance rituximab every 60 days for 2 years. I plan to see her again in 2 months, her next imaging would be due in February 2019 if needed  No orders of the defined types were placed in this encounter.  All questions were answered. The patient knows to call the clinic with any problems, questions or concerns. No barriers to learning was detected. I spent 10 minutes counseling the patient face to face. The total time spent in the appointment was 15 minutes and more than 50% was on counseling and review of test results     Heath Lark, MD 02/12/2017 12:30 PM

## 2017-02-12 NOTE — Telephone Encounter (Signed)
Scheduled appt per 11/9 los - Gave patient AVS and calender per los.  

## 2017-02-12 NOTE — Assessment & Plan Note (Signed)
Recent PET/CT scan showed complete response to treatment. We will proceed with consolidation treatment with maintenance rituximab every 60 days for 2 years. I plan to see her again in 2 months, her next imaging would be due in February 2019 if needed

## 2017-02-12 NOTE — Patient Instructions (Signed)
[facility] Discharge Instructions for Patients Receiving Chemotherapy  Today you received the following chemotherapy agents:  Rituxan.  To help prevent nausea and vomiting after your treatment, we encourage you to take your nausea medication as directed.   If you develop nausea and vomiting that is not controlled by your nausea medication, call the clinic.   BELOW ARE SYMPTOMS THAT SHOULD BE REPORTED IMMEDIATELY:  *FEVER GREATER THAN 100.5 F  *CHILLS WITH OR WITHOUT FEVER  NAUSEA AND VOMITING THAT IS NOT CONTROLLED WITH YOUR NAUSEA MEDICATION  *UNUSUAL SHORTNESS OF BREATH  *UNUSUAL BRUISING OR BLEEDING  TENDERNESS IN MOUTH AND THROAT WITH OR WITHOUT PRESENCE OF ULCERS  *URINARY PROBLEMS  *BOWEL PROBLEMS  UNUSUAL RASH Items with * indicate a potential emergency and should be followed up as soon as possible.  Feel free to call the clinic should you have any questions or concerns. The clinic phone number is [phone].  Please show the CHEMO ALERT CARD at check-in to the Emergency Department and triage nurse.   

## 2017-02-21 NOTE — Progress Notes (Signed)
Medicare wellness visit and follow up.   Assessment:   Essential hypertension - continue medications, DASH diet, exercise and monitor at home. Call if greater than 130/80.  - CBC with Differential/Platelet - Hepatic function panel - TSH - Urinalysis, Routine w reflex microscopic (not at [facility]) - Microalbumin / creatinine urine ratio - EKG 12-Lead  OSA with COPD overlap Sleep apnea- continue CPAP, weight loss advised.   Pulmonary emphysema, unspecified emphysema type Follow up pulmonary, continue inhaler.   Follicular lymphoma Continue follow up, on treatment at this time  Prediabetes Discussed general issues about diabetes pathophysiology and management., Educational material distributed., Suggested low cholesterol diet., Encouraged aerobic exercise., Discussed foot care., Reminded to get yearly retinal exam.  CKD (chronic kidney disease) stage 2, GFR 60-89 ml/min Increase fluids, avoid NSAIDS, monitor sugars, will monitor - BASIC METABOLIC PANEL WITH GFR  Hyperlipidemia -continue medications, check lipids, decrease fatty foods, increase activity.  - Lipid panel  Vitamin D deficiency - Vit D  25 hydroxy (rtn osteoporosis monitoring)   Medication management - Magnesium   Hypomagnesemia  Vitamin B12 deficiency Continue supplement  Gastroesophageal reflux disease with esophagitis Continue PPI/H2 blocker, diet discussed  Diverticulosis of intestine without bleeding, unspecified intestinal tract location Increase fiber  Neuropathy Continue medications, discussed feet checks   RLS (restless legs syndrome) Exercise   Hx of adenomatous colonic polyps Up to date  Anxiety remission   Osteoporosis On fosamax x 2016, recheck 2018 Will get with MGM  Plan:   During the course of the visit the patient was educated and counseled about appropriate screening and preventive services including:    Pneumococcal vaccine   Influenza vaccine  Td vaccine  Screening  electrocardiogram  Screening mammography  Bone densitometry screening  Colorectal cancer screening  Diabetes screening  Glaucoma screening  Nutrition counseling   .  Subjective:   Renee Hawkins is a 76 y.o. female who presents for Medicare Annual Wellness Visit and complete physical.     Her blood pressure has been controlled at home, today their BP is BP: 140/72 She does workout, still at [employer].  She denies chest pain, shortness of breath, dizziness.  Patient being treated for follicular lymphoma, following oncology, Dr. Alvy Bimler, still getting infusions Rituximab.  She has COPD with OSA, on CPAP and respimet, wears CPAP nightly, 6-8 hours, states it is helping with breathing and energy, well controlled. Follows with Dr. Welford Roche PRN.  She is on cholesterol medication, lipitor 80mg  1/2 pill 3 days a week and denies myalgias. Her cholesterol is at goal. The cholesterol last visit was:   Lab Results  Component Value Date   CHOL 244 (H) 08/05/2016   HDL 41 (L) 08/05/2016   LDLCALC 146 (H) 08/05/2016   TRIG 287 (H) 08/05/2016   CHOLHDL 6.0 (H) 08/05/2016   Lab Results  Component Value Date   GFRNONAA 71 08/05/2016   She has been working on diet and exercise for prediabetes, and denies nausea, paresthesia of the feet and polydipsia. Last A1C in the office was:  Lab Results  Component Value Date   HGBA1C 5.3 01/23/2016   Patient is on Vitamin D supplement.   Lab Results  Component Value Date   VD25OH 115 (H) 08/05/2016   She rarely takes the lorazepam for sleep.  She is on fosamax for osteoporosis x 2016  She is on potassium twice a day or 69meq total.  BMI is Body mass index is 21.92 kg/m., she is working on diet and exercise.  Wt Readings from Last 3 Encounters:  02/22/17 121 lb 12.8 oz (55.2 kg)  02/12/17 122 lb 9.6 oz (55.6 kg)  12/15/16 119 lb 12.8 oz (54.3 kg)    Names of Other Physician/Practitioners you currently use: 1. [facility]- here for primary care 2. [facility], March 2018 3. dentist, last visit as needed 2017 Patient Care Team: Unk Pinto, MD as PCP - General (Internal Medicine) Lafayette Dragon, MD (Inactive) as Consulting Physician (Gastroenterology) Loletha Carrow, MD (Pulmonary Disease) Calvert Cantor, MD as Consulting Physician (Ophthalmology) Heath Lark, MD as Consulting Physician (Hematology and Oncology) Rozetta Nunnery, MD as Consulting Physician (Otolaryngology)   Medication Review Current Outpatient Medications on File Prior to Visit  Medication Sig Dispense Refill  . Cholecalciferol 4000 UNITS CAPS Take 1 capsule by mouth daily.    . Ferrous Sulfate Dried (SLOW RELEASE IRON) 45 MG TBCR Take by mouth daily.    Marland Kitchen gabapentin (NEURONTIN) 300 MG capsule TAKE 1 CAPSULE BY MOUTH 3 TO 4 TIMES DAILY FOR PAIN 360 capsule 1  . Magnesium 300 MG CAPS Take 1 capsule by mouth 3 (three) times daily.     Marland Kitchen omeprazole (PRILOSEC) 40 MG capsule TAKE ONE CAPSULE BY MOUTH ONCE DAILY FOR  ACID  REFLUX 90 capsule 1  . potassium chloride (MICRO-K) 10 MEQ CR capsule TAKE TWO CAPSULES BY MOUTH ONCE DAILY 180 capsule 1  . tiotropium (SPIRIVA) 18 MCG inhalation capsule Place 18 mcg into inhaler and inhale daily.    . verapamil (CALAN-SR) 240 MG CR tablet TAKE ONE TABLET BY MOUTH ONCE DAILY AFTER A MEAL FOR BLOOD PRESSURE 90 tablet 0  . [DISCONTINUED] prochlorperazine (COMPAZINE) 10 MG tablet Take 1 tablet (10 mg total) by mouth every 6 (six) hours as needed (Nausea or vomiting). 30 tablet 6   No current facility-administered medications on file prior to visit.     Current Problems (verified) Patient Active Problem List   Diagnosis Date Noted  . Hypokalemia 05/28/2016  . Follicular lymphoma grade iiia, lymph nodes of head, face, and neck ([place]) 02/11/2016  . Dyspnea 02/11/2016  . Encounter for antineoplastic chemotherapy 02/11/2016  . OSA and COPD overlap syndrome  ([place]) 12/05/2014  . Osteoporosis 12/05/2014  . Medication management 03/06/2014  . CKD (chronic kidney disease) stage 2, GFR 60-89 ml/min 11/27/2013  . COPD (chronic obstructive pulmonary disease) with emphysema ([place]) 11/27/2013  . Hypertension   . Diverticulosis of colon with hemorrhage   . GERD    . Hx of adenomatous colonic polyps   . Hyperlipidemia   . Prediabetes   . Vitamin D deficiency   . RLS (restless legs syndrome)   . Vitamin B12 deficiency   . Anxiety     Screening Tests Immunization History  Administered Date(s) Administered  . Influenza Split 01/18/2013  . Influenza, High Dose Seasonal PF 12/05/2014, 01/23/2016, 12/22/2016  . Pneumococcal Conjugate-13 11/27/2013  . Pneumococcal Polysaccharide-23 11/19/2011  . Td 08/13/2008  . Zoster 04/06/2010   Preventative care: Last colonoscopy: 05/2014 Last mammogram: 02/05/2015 DUE Last pap smear/pelvic exam: Dr. Irven [place]  DEXA:02/2015 Started fosamax 2016 CT chest 2015, stable pulmonary nodule CT neck 01/29/2016 Echo 02/24/2016  Prior vaccinations: UPTODATE SEE LIST  Allergies No Known Allergies  SURGICAL HISTORY She  has a past surgical history that includes Appendectomy; Tonsillectomy; Colonoscopy (N/A, 05/18/2014); Eye surgery (2017); Lymph node biopsy (Right, 02/04/2016); and Direct laryngoscopy (Right, 02/04/2016). FAMILY HISTORY Her family history includes Stroke in her maternal grandmother and mother.  SOCIAL HISTORY She  reports that she quit smoking about 4 years ago. Her smoking use included cigarettes. She has a 30.00 pack-year smoking history. she has never used smokeless tobacco. She reports that she does not drink alcohol or use drugs.  MEDICARE WELLNESS OBJECTIVES: Physical activity: Current Exercise Habits: Structured exercise class, Type of exercise: strength training/weights;yoga, Time (Minutes): 30, Frequency (Times/Week): 2, Weekly Exercise (Minutes/Week): 60, Intensity: Mild Cardiac risk  factors: Cardiac Risk Factors include: advanced age (>33men, >24 women);dyslipidemia;hypertension;sedentary lifestyle Depression/mood screen:   Depression screen [facility] 2/9 02/22/2017  Decreased Interest 0  Down, Depressed, Hopeless 0  PHQ - 2 Score 0    ADLs:  In your present state of health, do you have any difficulty performing the following activities: 02/22/2017  Hearing? N  Vision? N  Difficulty concentrating or making decisions? N  Walking or climbing stairs? N  Dressing or bathing? N  Doing errands, shopping? N  Some recent data might be hidden    Cognitive Testing  Alert? Yes  Normal Appearance?Yes  Oriented to person? Yes  Place? Yes   Time? Yes  Recall of three objects?  1/3  Can perform simple calculations? Yes  Displays appropriate judgment?Yes  Can read the correct time from a watch face?Yes  EOL planning: Does Patient Have a Medical Advance Directive?: Yes Type of Advance Directive: Healthcare Power of Attorney, Living will Copy of [place] in Chart?: No - copy requested  Review of Systems  Constitutional: Negative.   HENT: Negative.        + Snoring, better on CPAP  Respiratory: Positive for shortness of breath (improved with CPAP, not using O2 anymore). Negative for cough, hemoptysis, sputum production and wheezing.   Cardiovascular: Negative.   Gastrointestinal: Negative for abdominal pain, blood in stool, constipation, diarrhea, heartburn, melena, nausea and vomiting.  Genitourinary: Negative for dysuria, flank pain, frequency, hematuria and urgency.  Musculoskeletal: Negative.  Negative for falls.  Skin: Negative.   Neurological: Positive for sensory change (bilateral legs). Negative for dizziness, tingling, tremors, speech change, focal weakness, seizures and loss of consciousness.  Psychiatric/Behavioral: Negative.  Negative for depression.    Objective:     Blood pressure 140/72, pulse 68, temperature 97.8 F (36.6 C), resp. rate  14, height 5' 2.5" (1.588 m), weight 121 lb 12.8 oz (55.2 kg), SpO2 97 %. Body mass index is 21.92 kg/m.  General appearance: alert, no distress, WD/WN,  female HEENT: normocephalic, sclerae anicteric, TMs pearly, nares patent, no discharge or erythema, pharynx normal Oral cavity: MMM, no lesions Neck: supple,  no thyromegaly, no masses Heart: RRR, normal S1, S2, no murmurs Lungs: CTA bilaterally, no wheezes, rhonchi, or rales Abdomen: +bs, soft, non tender, no rebound, distended, no masses, no hepatomegaly, no splenomegaly Musculoskeletal: nontender, no swelling, no obvious deformity Extremities: no edema, no cyanosis, no clubbing Pulses: 2+ symmetric, upper and lower extremities, normal cap refill Neurological: alert, oriented x 3, CN2-12 intact, strength normal upper extremities and lower extremities, sensation normal throughout, DTRs 2+ throughout, no cerebellar signs, gait normal Psychiatric: normal affect, behavior normal, pleasant  Breast: defer Gyn: defer  Rectal:   Medicare Attestation I have personally reviewed: The patient's medical and social history Their use of alcohol, tobacco or illicit drugs Their current medications and supplements The patient's functional ability including ADLs,fall risks, home safety risks, cognitive, and hearing and visual impairment Diet and physical activities Evidence for depression or mood disorders  The patient's weight, height, BMI, and visual acuity have been recorded  in the chart.  I have made referrals, counseling, and provided education to the patient based on review of the above and I have provided the patient with a written personalized care plan for preventive services.     Vicie Mutters, PA-C   02/22/2017

## 2017-02-22 ENCOUNTER — Encounter: Payer: Self-pay | Admitting: Physician Assistant

## 2017-02-22 ENCOUNTER — Ambulatory Visit (INDEPENDENT_AMBULATORY_CARE_PROVIDER_SITE_OTHER): Payer: Medicare Other | Admitting: Physician Assistant

## 2017-02-22 VITALS — BP 140/72 | HR 68 | Temp 97.8°F | Resp 14 | Ht 62.5 in | Wt 121.8 lb

## 2017-02-22 DIAGNOSIS — R6889 Other general symptoms and signs: Secondary | ICD-10-CM | POA: Diagnosis not present

## 2017-02-22 DIAGNOSIS — E785 Hyperlipidemia, unspecified: Secondary | ICD-10-CM | POA: Diagnosis not present

## 2017-02-22 DIAGNOSIS — E538 Deficiency of other specified B group vitamins: Secondary | ICD-10-CM | POA: Diagnosis not present

## 2017-02-22 DIAGNOSIS — M810 Age-related osteoporosis without current pathological fracture: Secondary | ICD-10-CM | POA: Diagnosis not present

## 2017-02-22 DIAGNOSIS — R7303 Prediabetes: Secondary | ICD-10-CM | POA: Diagnosis not present

## 2017-02-22 DIAGNOSIS — Z86010 Personal history of colonic polyps: Secondary | ICD-10-CM

## 2017-02-22 DIAGNOSIS — F419 Anxiety disorder, unspecified: Secondary | ICD-10-CM

## 2017-02-22 DIAGNOSIS — J449 Chronic obstructive pulmonary disease, unspecified: Secondary | ICD-10-CM

## 2017-02-22 DIAGNOSIS — E559 Vitamin D deficiency, unspecified: Secondary | ICD-10-CM

## 2017-02-22 DIAGNOSIS — K219 Gastro-esophageal reflux disease without esophagitis: Secondary | ICD-10-CM

## 2017-02-22 DIAGNOSIS — C8231 Follicular lymphoma grade IIIa, lymph nodes of head, face, and neck: Secondary | ICD-10-CM

## 2017-02-22 DIAGNOSIS — G2581 Restless legs syndrome: Secondary | ICD-10-CM

## 2017-02-22 DIAGNOSIS — Z136 Encounter for screening for cardiovascular disorders: Secondary | ICD-10-CM

## 2017-02-22 DIAGNOSIS — Z6821 Body mass index (BMI) 21.0-21.9, adult: Secondary | ICD-10-CM

## 2017-02-22 DIAGNOSIS — N182 Chronic kidney disease, stage 2 (mild): Secondary | ICD-10-CM

## 2017-02-22 DIAGNOSIS — Z0001 Encounter for general adult medical examination with abnormal findings: Secondary | ICD-10-CM

## 2017-02-22 DIAGNOSIS — K5731 Diverticulosis of large intestine without perforation or abscess with bleeding: Secondary | ICD-10-CM | POA: Diagnosis not present

## 2017-02-22 DIAGNOSIS — G4733 Obstructive sleep apnea (adult) (pediatric): Secondary | ICD-10-CM

## 2017-02-22 DIAGNOSIS — D649 Anemia, unspecified: Secondary | ICD-10-CM | POA: Diagnosis not present

## 2017-02-22 DIAGNOSIS — I10 Essential (primary) hypertension: Secondary | ICD-10-CM | POA: Diagnosis not present

## 2017-02-22 DIAGNOSIS — J439 Emphysema, unspecified: Secondary | ICD-10-CM

## 2017-02-22 DIAGNOSIS — Z79899 Other long term (current) drug therapy: Secondary | ICD-10-CM

## 2017-02-22 DIAGNOSIS — Z860101 Personal history of adenomatous and serrated colon polyps: Secondary | ICD-10-CM

## 2017-02-22 NOTE — Patient Instructions (Addendum)
Add sublingual B12. The sublingual is better than the pills because likely you are not absorbing in your intestines well so the sublingual gets absorbed through your mouth and ensures you get the amount you need. Will help with energy, memory/concentration, decrease nerve pain, and help with weight loss. B12 is water soluble vitamin so you can not over dose on it, and anything you do not use will be sent out in your urine.    [facility]  7 a.m.-6:30 p.m., Monday 7 a.m.-5 p.m., Tuesday-Friday Schedule an appointment by calling [phone].  Will do DEXA at same time  ENTERIC COATED low dose 81 mg Aspirin daily OR can do every other day if you have easy bruising to protect your heart and head. As well as to reduce risk of Colon Cancer by 20 %, Skin Cancer by 26 % , Melanoma by 46% and Pancreatic cancer by 60%  Fish oil can be obtained from eating fish or by taking supplements. Fish that are especially rich in the beneficial oils known as omega-3 fatty acids include mackerel, tuna, salmon, sturgeon, mullet, bluefish, anchovy, sardines, herring, trout, and menhaden. They provide about 1 gram of omega-3 fatty acids in about 3.5 ounces of fish. OR can do Krill oil  Fish oil supplements are usually made from mackerel, herring, tuna, halibut, salmon, cod liver, whale blubber, or seal blubber. Fish oil supplements often contain small amounts of vitamin E to prevent spoilage. They might also be combined with calcium, iron, or vitamins A, B1, B2, B3, C, or D.  Fish oil is used for a wide range of conditions. It is most often used for conditions related to the heart and blood system. Some people use fish oil to lower blood pressure or triglyceride levels (fats related to cholesterol). Fish oil has also been tried for preventing heart disease or stroke. The scientific evidence suggests that fish oil really does lower high triglycerides, and it also seems to help prevent heart  disease and stroke when taken in the recommended amounts. Ironically, taking too much fish oil can actually increase the risk of stroke.  Fish may have earned its reputation as "brain food" because some people eat fish to help with depression, psychosis, attention deficit-hyperactivity disorder (ADHD), Alzheimer's disease, and other thinking disorders.  Some people use fish oil for dry eyes, glaucoma, and age-related macular degeneration (AMD), a very common condition in older people that can lead to serious sight problems.  Women sometimes take fish oil to prevent painful periods; breast pain; and complications associated with pregnancy such as miscarriage, high blood pressure late in pregnancy, and early delivery.  Fish oil is also used for diabetes, asthma, developmental coordination disorders, movement disorders, dyslexia, obesity, kidney disease, weak bones (osteoporosis), certain diseases related to pain and swelling such as psoriasis, and preventing weight loss caused by some cancer drugs.  Fish oil is sometimes used after heart transplant surgery to prevent high blood pressure and kidney damage that can be caused by the surgery itself or by drugs used to reduce the chances that the body will reject the new heart. Fish oil is sometimes used after coronary artery bypass surgery. It seems to help keep the blood vessel that has been rerouted from closing up.  When fish oil is obtained by eating fish, the way the fish is prepared seems to make a difference. Eating broiled or baked fish appears to reduce the risk of heart disease, but eating fried fish or fish sandwiches  not only cancels out the benefits of fish oil, but may actually increase heart disease risk.  Two of the most important omega-3 fatty acids contained in fish oil are eicosapentaenoic acid (EPA) and docosahexaenoic acid (DHA).  How does it work? A lot of the benefit of fish oil seems to come from the omega-3 fatty acids that it  contains. Interestingly, the body does not produce its own omega-3 fatty acids. Nor can the body make omega-3 fatty acids from omega-6 fatty acids, which are common in the Western diet. A lot of research has been done on EPA and DHA, two types of omega-3 acids that are often included in fish oil supplements.  Omega-3 fatty acids reduce pain and swelling. This may explain why fish oil is likely effective for psoriasis and dry eyes. These fatty acids also prevent the blood from clotting easily. this might make fish oil helpful for some heart conditions.  Memory Compensation Strategies  1. Use "WARM" strategy.  W= write it down  A= associate it  R= repeat it  M= make a mental note  2.   You can keep a Social worker.  Use a 3-ring notebook with sections for the following: calendar, important names and phone numbers,  medications, doctors' names/phone numbers, lists/reminders, and a section to journal what you did  each day.   3.    Use a calendar to write appointments down.  4.    Write yourself a schedule for the day.  This can be placed on the calendar or in a separate section of the Memory Notebook.  Keeping a  regular schedule can help memory.  5.    Use medication organizer with sections for each day or morning/evening pills.  You may need help loading it  6.    Keep a basket, or pegboard by the door.  Place items that you need to take out with you in the basket or on the pegboard.  You may also want to  include a message board for reminders.  7.    Use sticky notes.  Place sticky notes with reminders in a place where the task is performed.  For example: " turn off the  stove" placed by the stove, "lock the door" placed on the door at eye level, " take your medications" on  the bathroom mirror or by the place where you normally take your medications.  8.    Use alarms/timers.  Use while cooking to remind yourself to check on food or as a reminder to take your medicine, or as a   reminder to make a call, or as a reminder to perform another task, etc.

## 2017-02-23 LAB — MICROALBUMIN / CREATININE URINE RATIO
Creatinine, Urine: 61 mg/dL (ref 20–275)
Microalb Creat Ratio: 7 ug/mg{creat} (ref <30)
Microalb, Ur: 0.4 mg/dL

## 2017-02-23 LAB — IRON, TOTAL/TOTAL IRON BINDING CAP
%SAT: 26 % (ref 11–50)
Iron: 74 ug/dL (ref 45–160)
TIBC: 287 ug/dL (ref 250–450)

## 2017-02-23 LAB — BASIC METABOLIC PANEL WITH GFR
BUN: 11 mg/dL (ref 7–25)
CALCIUM: 9.6 mg/dL (ref 8.6–10.4)
CHLORIDE: 103 mmol/L (ref 98–110)
CO2: 32 mmol/L (ref 20–32)
Creat: 0.84 mg/dL (ref 0.60–0.93)
GFR, EST NON AFRICAN AMERICAN: 68 mL/min/{1.73_m2} (ref >=60)
GFR, Est African American: 79 mL/min/{1.73_m2} (ref >=60)
Glucose, Bld: 105 mg/dL — ABNORMAL HIGH (ref 65–99)
POTASSIUM: 3.6 mmol/L (ref 3.5–5.3)
Sodium: 143 mmol/L (ref 135–146)

## 2017-02-23 LAB — URINALYSIS, ROUTINE W REFLEX MICROSCOPIC
BACTERIA UA: NONE SEEN /HPF
Bilirubin Urine: NEGATIVE
Glucose, UA: NEGATIVE
HGB URINE DIPSTICK: NEGATIVE
HYALINE CAST: NONE SEEN /LPF
Ketones, ur: NEGATIVE
Nitrite: NEGATIVE
Protein, ur: NEGATIVE
RBC / HPF: NONE SEEN /HPF (ref 0–2)
Specific Gravity, Urine: 1.01 (ref 1.001–1.03)
WBC UA: NONE SEEN /HPF (ref 0–5)
pH: 6.5 (ref 5.0–8.0)

## 2017-02-23 LAB — CBC WITH DIFFERENTIAL/PLATELET
BASOS ABS: 19 {cells}/uL (ref 0–200)
Basophils Relative: 0.4 %
EOS ABS: 61 {cells}/uL (ref 15–500)
EOS PCT: 1.3 %
HCT: 35.5 % (ref 35.0–45.0)
HEMOGLOBIN: 12.2 g/dL (ref 11.7–15.5)
Lymphs Abs: 879 cells/uL (ref 850–3900)
MCH: 31.4 pg (ref 27.0–33.0)
MCHC: 34.4 g/dL (ref 32.0–36.0)
MCV: 91.5 fL (ref 80.0–100.0)
MONOS PCT: 6.4 %
MPV: 10.6 fL (ref 7.5–12.5)
NEUTROS ABS: 3440 {cells}/uL (ref 1500–7800)
Neutrophils Relative %: 73.2 %
PLATELETS: 173 10*3/uL (ref 140–400)
RBC: 3.88 10*6/uL (ref 3.80–5.10)
RDW: 13.3 % (ref 11.0–15.0)
TOTAL LYMPHOCYTE: 18.7 %
WBC mixed population: 301 cells/uL (ref 200–950)
WBC: 4.7 10*3/uL (ref 3.8–10.8)

## 2017-02-23 LAB — TSH: TSH: 1.66 m[IU]/L (ref 0.40–4.50)

## 2017-02-23 LAB — MAGNESIUM: Magnesium: 1.8 mg/dL (ref 1.5–2.5)

## 2017-02-23 LAB — HEPATIC FUNCTION PANEL
AG Ratio: 1.9 (calc) (ref 1.0–2.5)
ALBUMIN MSPROF: 4.3 g/dL (ref 3.6–5.1)
ALT: 14 U/L (ref 6–29)
AST: 17 U/L (ref 10–35)
Alkaline phosphatase (APISO): 81 U/L (ref 33–130)
Bilirubin, Direct: 0.1 mg/dL (ref 0.0–0.2)
GLOBULIN: 2.3 g/dL (ref 1.9–3.7)
Indirect Bilirubin: 0.2 mg/dL (calc) (ref 0.2–1.2)
Total Bilirubin: 0.3 mg/dL (ref 0.2–1.2)
Total Protein: 6.6 g/dL (ref 6.1–8.1)

## 2017-02-23 LAB — LIPID PANEL
CHOL/HDL RATIO: 5 (calc) — AB (ref <5.0)
CHOLESTEROL: 229 mg/dL — AB (ref <200)
HDL: 46 mg/dL — ABNORMAL LOW (ref >50)
LDL CHOLESTEROL (CALC): 152 mg/dL — AB
Non-HDL Cholesterol (Calc): 183 mg/dL (calc) — ABNORMAL HIGH (ref <130)
TRIGLYCERIDES: 168 mg/dL — AB (ref <150)

## 2017-02-23 LAB — VITAMIN B12: Vitamin B-12: 658 pg/mL (ref 200–1100)

## 2017-02-23 LAB — MICROSCOPIC MESSAGE

## 2017-03-01 ENCOUNTER — Encounter: Payer: Self-pay | Admitting: Internal Medicine

## 2017-03-05 ENCOUNTER — Other Ambulatory Visit: Payer: Self-pay | Admitting: Physician Assistant

## 2017-03-05 DIAGNOSIS — Z1231 Encounter for screening mammogram for malignant neoplasm of breast: Secondary | ICD-10-CM

## 2017-04-01 ENCOUNTER — Other Ambulatory Visit: Payer: Self-pay | Admitting: Internal Medicine

## 2017-04-01 DIAGNOSIS — K219 Gastro-esophageal reflux disease without esophagitis: Secondary | ICD-10-CM

## 2017-04-07 ENCOUNTER — Ambulatory Visit
Admission: RE | Admit: 2017-04-07 | Discharge: 2017-04-07 | Disposition: A | Discharge status: Home / Self Care | Payer: Medicare Other | Source: Ambulatory Visit | Attending: Physician Assistant | Admitting: Physician Assistant

## 2017-04-07 ENCOUNTER — Ambulatory Visit
Admission: RE | Admit: 2017-04-07 | Discharge: 2017-04-07 | Disposition: A | Discharge status: Home / Self Care | Payer: Medicare HMO | Source: Ambulatory Visit | Attending: Physician Assistant | Admitting: Physician Assistant

## 2017-04-07 DIAGNOSIS — M810 Age-related osteoporosis without current pathological fracture: Secondary | ICD-10-CM | POA: Diagnosis not present

## 2017-04-07 DIAGNOSIS — Z1231 Encounter for screening mammogram for malignant neoplasm of breast: Secondary | ICD-10-CM | POA: Diagnosis not present

## 2017-04-07 DIAGNOSIS — Z780 Asymptomatic menopausal state: Secondary | ICD-10-CM | POA: Diagnosis not present

## 2017-04-13 ENCOUNTER — Inpatient Hospital Stay: Payer: Medicare HMO | Attending: Hematology and Oncology

## 2017-04-13 ENCOUNTER — Inpatient Hospital Stay: Payer: Medicare HMO

## 2017-04-13 ENCOUNTER — Telehealth: Payer: Self-pay | Admitting: Hematology and Oncology

## 2017-04-13 ENCOUNTER — Inpatient Hospital Stay (HOSPITAL_BASED_OUTPATIENT_CLINIC_OR_DEPARTMENT_OTHER): Payer: Medicare HMO | Admitting: Hematology and Oncology

## 2017-04-13 VITALS — BP 111/73 | HR 84 | Temp 98.3°F | Resp 16

## 2017-04-13 DIAGNOSIS — Z5112 Encounter for antineoplastic immunotherapy: Secondary | ICD-10-CM | POA: Insufficient documentation

## 2017-04-13 DIAGNOSIS — C8231 Follicular lymphoma grade IIIa, lymph nodes of head, face, and neck: Secondary | ICD-10-CM

## 2017-04-13 LAB — CBC WITH DIFFERENTIAL/PLATELET
ABS GRANULOCYTE: 7.7 10*3/uL — AB (ref 1.5–6.5)
BASOS ABS: 0 10*3/uL (ref 0.0–0.1)
BASOS PCT: 0 %
EOS PCT: 1 %
Eosinophils Absolute: 0.1 10*3/uL (ref 0.0–0.5)
HEMATOCRIT: 38.8 % (ref 34.8–46.6)
Hemoglobin: 13 g/dL (ref 11.6–15.9)
LYMPHS PCT: 8 %
Lymphs Abs: 0.7 10*3/uL — ABNORMAL LOW (ref 0.9–3.3)
MCH: 32 pg (ref 25.1–34.0)
MCHC: 33.6 g/dL (ref 31.5–36.0)
MCV: 95.3 fL (ref 79.5–101.0)
MONO ABS: 0.5 10*3/uL (ref 0.1–0.9)
Monocytes Relative: 6 %
NEUTROS PCT: 85 %
Neutro Abs: 7.7 10*3/uL — ABNORMAL HIGH (ref 1.5–6.5)
PLATELETS: 182 10*3/uL (ref 145–400)
RBC: 4.07 MIL/uL (ref 3.70–5.45)
RDW: 13.8 % (ref 11.2–16.1)
WBC: 9 10*3/uL (ref 3.9–10.3)

## 2017-04-13 LAB — COMPREHENSIVE METABOLIC PANEL
ALT: 15 U/L (ref 0–55)
AST: 15 U/L (ref 5–34)
Albumin: 3.9 g/dL (ref 3.5–5.0)
Alkaline Phosphatase: 100 U/L (ref 40–150)
Anion gap: 10 (ref 3–11)
BILIRUBIN TOTAL: 0.4 mg/dL (ref 0.2–1.2)
BUN: 15 mg/dL (ref 7–26)
CHLORIDE: 104 mmol/L (ref 98–109)
CO2: 26 mmol/L (ref 22–29)
Calcium: 8.9 mg/dL (ref 8.4–10.4)
Creatinine, Ser: 1.04 mg/dL (ref 0.60–1.10)
GFR, EST AFRICAN AMERICAN: 60 mL/min — AB (ref >60)
GFR, EST NON AFRICAN AMERICAN: 52 mL/min — AB (ref >60)
Glucose, Bld: 115 mg/dL (ref 70–140)
Potassium: 3.8 mmol/L (ref 3.3–4.7)
Sodium: 140 mmol/L (ref 136–145)
TOTAL PROTEIN: 6.7 g/dL (ref 6.4–8.3)

## 2017-04-13 MED ORDER — FAMOTIDINE IN NACL 20-0.9 MG/50ML-% IV SOLN
INTRAVENOUS | Status: AC
  Filled 2017-04-13: qty 50

## 2017-04-13 MED ORDER — FAMOTIDINE IN NACL 20-0.9 MG/50ML-% IV SOLN
20.0000 mg | Freq: Once | INTRAVENOUS | Status: AC
  Administered 2017-04-13: 20 mg via INTRAVENOUS

## 2017-04-13 MED ORDER — SODIUM CHLORIDE 0.9 % IV SOLN
Freq: Once | INTRAVENOUS | Status: AC
  Administered 2017-04-13: 09:00:00 via INTRAVENOUS

## 2017-04-13 MED ORDER — ACETAMINOPHEN 325 MG PO TABS
ORAL_TABLET | ORAL | Status: AC
  Filled 2017-04-13: qty 2

## 2017-04-13 MED ORDER — ACETAMINOPHEN 325 MG PO TABS
650.0000 mg | ORAL_TABLET | Freq: Once | ORAL | Status: AC
  Administered 2017-04-13: 650 mg via ORAL

## 2017-04-13 MED ORDER — SODIUM CHLORIDE 0.9 % IV SOLN
375.0000 mg/m2 | Freq: Once | INTRAVENOUS | Status: AC
  Administered 2017-04-13: 600 mg via INTRAVENOUS
  Filled 2017-04-13: qty 10

## 2017-04-13 NOTE — Telephone Encounter (Signed)
Gave patient avs and calendar with appts per 1/8 los.  °

## 2017-04-13 NOTE — Patient Instructions (Signed)
[facility] Discharge Instructions for Patients Receiving Chemotherapy  Today you received the following chemotherapy agent: Rituxan   To help prevent nausea and vomiting after your treatment, we encourage you to take your nausea medication as directed.    If you develop nausea and vomiting that is not controlled by your nausea medication, call the clinic.   BELOW ARE SYMPTOMS THAT SHOULD BE REPORTED IMMEDIATELY:  *FEVER GREATER THAN 100.5 F  *CHILLS WITH OR WITHOUT FEVER  NAUSEA AND VOMITING THAT IS NOT CONTROLLED WITH YOUR NAUSEA MEDICATION  *UNUSUAL SHORTNESS OF BREATH  *UNUSUAL BRUISING OR BLEEDING  TENDERNESS IN MOUTH AND THROAT WITH OR WITHOUT PRESENCE OF ULCERS  *URINARY PROBLEMS  *BOWEL PROBLEMS  UNUSUAL RASH Items with * indicate a potential emergency and should be followed up as soon as possible.  Feel free to call the clinic should you have any questions or concerns. The clinic phone number is [phone].  Please show the CHEMO ALERT CARD at check-in to the Emergency Department and triage nurse.   

## 2017-04-14 DIAGNOSIS — T1512XA Foreign body in conjunctival sac, left eye, initial encounter: Secondary | ICD-10-CM | POA: Diagnosis not present

## 2017-04-15 ENCOUNTER — Encounter: Payer: Self-pay | Admitting: Hematology and Oncology

## 2017-04-15 NOTE — Progress Notes (Signed)
[facility] OFFICE PROGRESS NOTE  Patient Care Team: Unk Pinto, MD as PCP - General (Internal Medicine) Lafayette Dragon, MD (Inactive) as Consulting Physician (Gastroenterology) Loletha Carrow, MD (Pulmonary Disease) Calvert Cantor, MD as Consulting Physician (Ophthalmology) Heath Lark, MD as Consulting Physician (Hematology and Oncology) Rozetta Nunnery, MD as Consulting Physician (Otolaryngology)  SUMMARY OF ONCOLOGIC HISTORY:   Follicular lymphoma grade iiia, lymph nodes of head, face, and neck ([place])   01/29/2016 Imaging    Ct neck showed right submandibular lymphadenopathy and asymmetric prominence of jugular chain nodes. Metastatic disease is the primary diagnostic concern and submandibular node sampling is recommended. No primary lesion is seen, limited at the oral cavity due to dental artifact. 2. Emphysema      02/04/2016 Pathology Results    Accession: [id number] Biopsy showed high grade follicular lymphoma      02/24/2016 PET scan    Hypermetabolic right submandibular and right level II/III lymph nodes, consistent with the given history of lymphoma. No hypermetabolic lymph nodes in the chest, abdomen or pelvis. Aortic atherosclerosis (ICD10-170.0). Coronary artery calcification. Emphysema (ICD10-J43.9).      02/24/2016 Imaging    ECHO showed LV The cavity size was normal. Systolic function was   normal. The estimated ejection fraction was in the range of 55% to 60%. Wall motion was normal; there were no regional wall motion abnormalities. Doppler parameters are consistent with abnormal left ventricular relaxation (grade 1 diastolic dysfunction).      03/13/2016 - 04/24/2016 Chemotherapy    The patient had R-CHOP chemo x 3 cycles      05/27/2016 PET scan    No metabolic activity associated with the RIGHT submandibular gland or RIGHT cervical lymph nodes consistent with a complete response. 2. Bilateral pulmonary nodules are not changed from comparison  exam. 3. Diffuse mild esophagus metabolic activity suggests mild esophagitis.       INTERVAL HISTORY: Please see below for problem oriented charting. She returns for further follow-up and treatment She is feeling well  no recent infection No new lymphadenopathy  REVIEW OF SYSTEMS:   Constitutional: Denies fevers, chills or abnormal weight loss Eyes: Denies blurriness of vision Ears, nose, mouth, throat, and face: Denies mucositis or sore throat Respiratory: Denies cough, dyspnea or wheezes Cardiovascular: Denies palpitation, chest discomfort or lower extremity swelling Gastrointestinal:  Denies nausea, heartburn or change in bowel habits Skin: Denies abnormal skin rashes Lymphatics: Denies new lymphadenopathy or easy bruising Neurological:Denies numbness, tingling or new weaknesses Behavioral/Psych: Mood is stable, no new changes  All other systems were reviewed with the patient and are negative.  I have reviewed the past medical history, past surgical history, social history and family history with the patient and they are unchanged from previous note.  ALLERGIES:  has No Known Allergies.  MEDICATIONS:  Current Outpatient Medications  Medication Sig Dispense Refill  . aspirin EC 81 MG tablet Take 81 mg by mouth daily.    . Cholecalciferol 4000 UNITS CAPS Take 1 capsule by mouth daily.    Marland Kitchen gabapentin (NEURONTIN) 300 MG capsule TAKE 1 CAPSULE BY MOUTH 3 TO 4 TIMES DAILY FOR PAIN 360 capsule 1  . Magnesium 300 MG CAPS Take 1 capsule by mouth 3 (three) times daily.     Marland Kitchen omeprazole (PRILOSEC) 40 MG capsule TAKE ONE CAPSULE BY MOUTH ONCE DAILY FOR  ACID  REFLUX 90 capsule 1  . potassium chloride (MICRO-K) 10 MEQ CR capsule TAKE TWO CAPSULES BY MOUTH ONCE DAILY 180 capsule 1  .  tiotropium (SPIRIVA) 18 MCG inhalation capsule Place 18 mcg into inhaler and inhale daily.     No current facility-administered medications for this visit.     PHYSICAL EXAMINATION: ECOG PERFORMANCE  STATUS: 1 - Symptomatic but completely ambulatory  Vitals:   04/13/17 0805  BP: 135/72  Pulse: 96  Resp: 16  Temp: 97.9 F (36.6 C)  SpO2: 94%   Filed Weights   04/13/17 0805  Weight: 126 lb (57.2 kg)    GENERAL:alert, no distress and comfortable SKIN: skin color, texture, turgor are normal, no rashes or significant lesions EYES: normal, Conjunctiva are pink and non-injected, sclera clear OROPHARYNX:no exudate, no erythema and lips, buccal mucosa, and tongue normal  NECK: supple, thyroid normal size, non-tender, without nodularity LYMPH:  no palpable lymphadenopathy in the cervical, axillary or inguinal LUNGS: clear to auscultation and percussion with normal breathing effort HEART: regular rate & rhythm and no murmurs and no lower extremity edema ABDOMEN:abdomen soft, non-tender and normal bowel sounds Musculoskeletal:no cyanosis of digits and no clubbing  NEURO: alert & oriented x 3 with fluent speech, no focal motor/sensory deficits  LABORATORY DATA:  I have reviewed the data as listed    Component Value Date/Time   NA 140 04/13/2017 0741   NA 142 02/12/2017 0809   K 3.8 04/13/2017 0741   K 4.0 02/12/2017 0809   CL 104 04/13/2017 0741   CO2 26 04/13/2017 0741   CO2 28 02/12/2017 0809   GLUCOSE 115 04/13/2017 0741   GLUCOSE 97 02/12/2017 0809   BUN 15 04/13/2017 0741   BUN 11.2 02/12/2017 0809   CREATININE 1.04 04/13/2017 0741   CREATININE 0.84 02/22/2017 1502   CREATININE 0.9 02/12/2017 0809   CALCIUM 8.9 04/13/2017 0741   CALCIUM 9.3 02/12/2017 0809   PROT 6.7 04/13/2017 0741   PROT 6.7 02/12/2017 0809   ALBUMIN 3.9 04/13/2017 0741   ALBUMIN 3.8 02/12/2017 0809   AST 15 04/13/2017 0741   AST 16 02/12/2017 0809   ALT 15 04/13/2017 0741   ALT 15 02/12/2017 0809   ALKPHOS 100 04/13/2017 0741   ALKPHOS 88 02/12/2017 0809   BILITOT 0.4 04/13/2017 0741   BILITOT 0.40 02/12/2017 0809   GFRNONAA 52 (L) 04/13/2017 0741   GFRNONAA 68 02/22/2017 1502   GFRAA 60  (L) 04/13/2017 0741   GFRAA 79 02/22/2017 1502    No results found for: SPEP, UPEP  Lab Results  Component Value Date   WBC 9.0 04/13/2017   NEUTROABS 7.7 (H) 04/13/2017   HGB 13.0 04/13/2017   HCT 38.8 04/13/2017   MCV 95.3 04/13/2017   PLT 182 04/13/2017      Chemistry      Component Value Date/Time   NA 140 04/13/2017 0741   NA 142 02/12/2017 0809   K 3.8 04/13/2017 0741   K 4.0 02/12/2017 0809   CL 104 04/13/2017 0741   CO2 26 04/13/2017 0741   CO2 28 02/12/2017 0809   BUN 15 04/13/2017 0741   BUN 11.2 02/12/2017 0809   CREATININE 1.04 04/13/2017 0741   CREATININE 0.84 02/22/2017 1502   CREATININE 0.9 02/12/2017 0809      Component Value Date/Time   CALCIUM 8.9 04/13/2017 0741   CALCIUM 9.3 02/12/2017 0809   ALKPHOS 100 04/13/2017 0741   ALKPHOS 88 02/12/2017 0809   AST 15 04/13/2017 0741   AST 16 02/12/2017 0809   ALT 15 04/13/2017 0741   ALT 15 02/12/2017 0809   BILITOT 0.4 04/13/2017 0741   BILITOT  0.40 02/12/2017 0809       RADIOGRAPHIC STUDIES: I have personally reviewed the radiological images as listed and agreed with the findings in the report. Dg Bone Density  Result Date: 04/07/2017 EXAM: DUAL X-RAY ABSORPTIOMETRY (DXA) FOR BONE MINERAL DENSITY IMPRESSION: Referring Physician:  Vicie Mutters PATIENT: Name: Mariyana, Fulop Patient ID: [id number] Birth Date: 1940/09/08 Height: 62.0 in. Sex: Female Measured: 04/07/2017 Weight: 126.1 lbs. Indications: Advanced Age, Caucasian, Estrogen Deficient, Low Calcium Intake (269.3), Postmenopausal, Prilosec Fractures: None Treatments: Vitamin D (E933.5) ASSESSMENT: The BMD measured at Femur Neck Right is 0.592 g/cm2 with a T-score of -3.2. This patient is considered osteoporotic according to [facility]) criteria. L-3 and L-4 were excluded due to degenerative changes. There has been a statistically significant increase in BMD of Right hip, and no change in BMD of Lumbar Spine since prior exam  dated 02/05/2015. Site Region Measured Date Measured Age YA BMD Significant CHANGE T-score DualFemur Neck Right 04/07/2017 74.8 -3.2 0.592 g/cm2 * AP Spine  L1-L2      6/07/2017    74.8         -0.6    1.100 g/cm2 World Health Organization [facility]) criteria for post-menopausal, Caucasian Women: Normal       T-score at or above -1 SD Osteopenia   T-score between -1 and -2.5 SD Osteoporosis T-score at or below -2.5 SD RECOMMENDATION: [place] recommends that FDA-approved medical therapies be considered in postmenopausal women and men age 41 or older with a: 1. Hip or vertebral (clinical or morphometric) fracture. 2. T-score of <-2.5 at the spine or hip. 3. Ten-year fracture probability by FRAX of 3% or greater for hip fracture or 20% or greater for major osteoporotic fracture. All treatment decisions require clinical judgment and consideration of individual patient factors, including patient preferences, co-morbidities, previous drug use, risk factors not captured in the FRAX model (e.g. falls, vitamin D deficiency, increased bone turnover, interval significant decline in bone density) and possible under - or over-estimation of fracture risk by FRAX. All patients should ensure an adequate intake of dietary calcium (1200 mg/d) and vitamin D (800 IU daily) unless contraindicated. FOLLOW-UP: People with diagnosed cases of osteoporosis or at high risk for fracture should have regular bone mineral density tests. For patients eligible for Medicare, routine testing is allowed once every 2 years. The testing frequency can be increased to one year for patients who have rapidly progressing disease, those who are receiving or discontinuing medical therapy to restore bone mass, or have additional risk factors. I have reviewed this report, and agree with the above findings. [facility] Electronically Signed   By: Earle Gell M.D.   On: 04/07/2017 14:06   Mm Digital Screening Bilateral  Result  Date: 04/07/2017 CLINICAL DATA:  Screening. EXAM: DIGITAL SCREENING BILATERAL MAMMOGRAM WITH CAD COMPARISON:  Previous exam(s). ACR Breast Density Category c: The breast tissue is heterogeneously dense, which may obscure small masses. FINDINGS: There are no findings suspicious for malignancy. Images were processed with CAD. IMPRESSION: No mammographic evidence of malignancy. A result letter of this screening mammogram will be mailed directly to the patient. RECOMMENDATION: Screening mammogram in one year. (Code:[id number]) BI-RADS CATEGORY  1: Negative. Electronically Signed   By: Nolon Nations M.D.   On: 04/07/2017 14:28    ASSESSMENT & PLAN:  Follicular lymphoma grade iiia, lymph nodes of head, face, and neck ([place]) Recent PET/CT scan showed complete response to treatment. We will proceed with consolidation treatment with maintenance rituximab  every 60 days for 2 years. Due to no signs of disease, I would defer imaging study until later in the year.   No orders of the defined types were placed in this encounter.  All questions were answered. The patient knows to call the clinic with any problems, questions or concerns. No barriers to learning was detected. I spent 10 minutes counseling the patient face to face. The total time spent in the appointment was 15 minutes and more than 50% was on counseling and review of test results     Heath Lark, MD 04/15/2017 7:06 AM

## 2017-04-15 NOTE — Assessment & Plan Note (Signed)
Recent PET/CT scan showed complete response to treatment. We will proceed with consolidation treatment with maintenance rituximab every 60 days for 2 years. Due to no signs of disease, I would defer imaging study until later in the year.

## 2017-05-15 ENCOUNTER — Other Ambulatory Visit: Payer: Self-pay | Admitting: Physician Assistant

## 2017-05-15 DIAGNOSIS — M549 Dorsalgia, unspecified: Secondary | ICD-10-CM

## 2017-05-18 ENCOUNTER — Other Ambulatory Visit: Payer: Self-pay | Admitting: *Deleted

## 2017-05-18 DIAGNOSIS — M549 Dorsalgia, unspecified: Secondary | ICD-10-CM

## 2017-05-18 DIAGNOSIS — K219 Gastro-esophageal reflux disease without esophagitis: Secondary | ICD-10-CM

## 2017-05-18 MED ORDER — OMEPRAZOLE 40 MG PO CPDR: DELAYED_RELEASE_CAPSULE | ORAL | 1 refills | Status: DC

## 2017-05-18 MED ORDER — GABAPENTIN 300 MG PO CAPS: ORAL_CAPSULE | ORAL | 1 refills | Status: DC

## 2017-05-20 ENCOUNTER — Other Ambulatory Visit: Payer: Self-pay | Admitting: Internal Medicine

## 2017-05-20 DIAGNOSIS — K219 Gastro-esophageal reflux disease without esophagitis: Secondary | ICD-10-CM

## 2017-05-20 DIAGNOSIS — M549 Dorsalgia, unspecified: Secondary | ICD-10-CM

## 2017-05-20 MED ORDER — GABAPENTIN 300 MG PO CAPS: ORAL_CAPSULE | ORAL | 1 refills | Status: DC

## 2017-05-20 MED ORDER — OMEPRAZOLE 40 MG PO CPDR: DELAYED_RELEASE_CAPSULE | ORAL | 1 refills | Status: DC

## 2017-05-26 ENCOUNTER — Other Ambulatory Visit: Payer: Self-pay | Admitting: Internal Medicine

## 2017-05-26 DIAGNOSIS — K219 Gastro-esophageal reflux disease without esophagitis: Secondary | ICD-10-CM

## 2017-05-26 DIAGNOSIS — M549 Dorsalgia, unspecified: Secondary | ICD-10-CM

## 2017-05-26 MED ORDER — OMEPRAZOLE 40 MG PO CPDR: DELAYED_RELEASE_CAPSULE | ORAL | 1 refills | Status: DC

## 2017-05-26 MED ORDER — GABAPENTIN 300 MG PO CAPS: ORAL_CAPSULE | ORAL | 1 refills | Status: DC

## 2017-05-28 ENCOUNTER — Other Ambulatory Visit: Payer: Self-pay | Admitting: Physician Assistant

## 2017-05-28 MED ORDER — TIOTROPIUM BROMIDE-OLODATEROL 2.5-2.5 MCG/ACT IN AERS: 2.0000 | INHALATION_SPRAY | Freq: Every day | RESPIRATORY_TRACT | 4 refills | Status: DC

## 2017-06-14 ENCOUNTER — Inpatient Hospital Stay: Payer: Medicare HMO

## 2017-06-14 ENCOUNTER — Telehealth: Payer: Self-pay | Admitting: Hematology and Oncology

## 2017-06-14 ENCOUNTER — Other Ambulatory Visit: Payer: Self-pay | Admitting: Hematology and Oncology

## 2017-06-14 ENCOUNTER — Inpatient Hospital Stay: Payer: Medicare HMO | Attending: Hematology and Oncology | Admitting: Hematology and Oncology

## 2017-06-14 ENCOUNTER — Encounter: Payer: Self-pay | Admitting: Hematology and Oncology

## 2017-06-14 VITALS — BP 127/68 | HR 79 | Temp 97.6°F | Resp 18

## 2017-06-14 DIAGNOSIS — C8231 Follicular lymphoma grade IIIa, lymph nodes of head, face, and neck: Secondary | ICD-10-CM | POA: Diagnosis not present

## 2017-06-14 DIAGNOSIS — Z5112 Encounter for antineoplastic immunotherapy: Secondary | ICD-10-CM | POA: Diagnosis not present

## 2017-06-14 DIAGNOSIS — E876 Hypokalemia: Secondary | ICD-10-CM

## 2017-06-14 LAB — CBC WITH DIFFERENTIAL/PLATELET
BASOS ABS: 0 10*3/uL (ref 0.0–0.1)
Basophils Relative: 1 %
EOS PCT: 2 %
Eosinophils Absolute: 0.1 10*3/uL (ref 0.0–0.5)
HCT: 38.7 % (ref 34.8–46.6)
HEMOGLOBIN: 12.7 g/dL (ref 11.6–15.9)
LYMPHS ABS: 0.7 10*3/uL — AB (ref 0.9–3.3)
LYMPHS PCT: 12 %
MCH: 31.4 pg (ref 25.1–34.0)
MCHC: 32.9 g/dL (ref 31.5–36.0)
MCV: 95.3 fL (ref 79.5–101.0)
Monocytes Absolute: 0.4 10*3/uL (ref 0.1–0.9)
Monocytes Relative: 7 %
NEUTROS ABS: 4.6 10*3/uL (ref 1.5–6.5)
NEUTROS PCT: 78 %
PLATELETS: 174 10*3/uL (ref 145–400)
RBC: 4.06 MIL/uL (ref 3.70–5.45)
RDW: 14.3 % (ref 11.2–14.5)
WBC: 5.8 10*3/uL (ref 3.9–10.3)

## 2017-06-14 LAB — COMPREHENSIVE METABOLIC PANEL
ALK PHOS: 93 U/L (ref 40–150)
ALT: 12 U/L (ref 0–55)
AST: 12 U/L (ref 5–34)
Albumin: 3.7 g/dL (ref 3.5–5.0)
Anion gap: 9 (ref 3–11)
BUN: 10 mg/dL (ref 7–26)
CALCIUM: 9.2 mg/dL (ref 8.4–10.4)
CHLORIDE: 104 mmol/L (ref 98–109)
CO2: 28 mmol/L (ref 22–29)
CREATININE: 0.93 mg/dL (ref 0.60–1.10)
GFR calc Af Amer: >60 mL/min (ref >60)
GFR, EST NON AFRICAN AMERICAN: 59 mL/min — AB (ref >60)
Glucose, Bld: 109 mg/dL (ref 70–140)
Potassium: 3.6 mmol/L (ref 3.5–5.1)
Sodium: 141 mmol/L (ref 136–145)
Total Bilirubin: 0.4 mg/dL (ref 0.2–1.2)
Total Protein: 6.7 g/dL (ref 6.4–8.3)

## 2017-06-14 MED ORDER — ACETAMINOPHEN 325 MG PO TABS
650.0000 mg | ORAL_TABLET | Freq: Once | ORAL | Status: AC
  Administered 2017-06-14: 650 mg via ORAL

## 2017-06-14 MED ORDER — SODIUM CHLORIDE 0.9 % IV SOLN
375.0000 mg/m2 | Freq: Once | INTRAVENOUS | Status: AC
  Administered 2017-06-14: 600 mg via INTRAVENOUS
  Filled 2017-06-14: qty 10

## 2017-06-14 MED ORDER — ACETAMINOPHEN 325 MG PO TABS
ORAL_TABLET | ORAL | Status: AC
  Filled 2017-06-14: qty 2

## 2017-06-14 MED ORDER — SODIUM CHLORIDE 0.9 % IV SOLN
20.0000 mg | Freq: Once | INTRAVENOUS | Status: AC
  Administered 2017-06-14: 20 mg via INTRAVENOUS
  Filled 2017-06-14: qty 2

## 2017-06-14 MED ORDER — SODIUM CHLORIDE 0.9 % IV SOLN
Freq: Once | INTRAVENOUS | Status: AC
  Administered 2017-06-14: 09:00:00 via INTRAVENOUS

## 2017-06-14 MED ORDER — FAMOTIDINE IN NACL 20-0.9 MG/50ML-% IV SOLN: 20.0000 mg | Freq: Two times a day (BID) | INTRAVENOUS | Status: DC

## 2017-06-14 NOTE — Assessment & Plan Note (Signed)
She has no further recurrence of hypokalemia I recommend discontinuation of potassium supplement but continue on magnesium supplement I gave her a sheet of patient education handout in regards to potassium rich diet

## 2017-06-14 NOTE — Patient Instructions (Signed)
[facility] Discharge Instructions for Patients Receiving Chemotherapy  Today you received the following chemotherapy agent: Rituxan   To help prevent nausea and vomiting after your treatment, we encourage you to take your nausea medication as directed.    If you develop nausea and vomiting that is not controlled by your nausea medication, call the clinic.   BELOW ARE SYMPTOMS THAT SHOULD BE REPORTED IMMEDIATELY:  *FEVER GREATER THAN 100.5 F  *CHILLS WITH OR WITHOUT FEVER  NAUSEA AND VOMITING THAT IS NOT CONTROLLED WITH YOUR NAUSEA MEDICATION  *UNUSUAL SHORTNESS OF BREATH  *UNUSUAL BRUISING OR BLEEDING  TENDERNESS IN MOUTH AND THROAT WITH OR WITHOUT PRESENCE OF ULCERS  *URINARY PROBLEMS  *BOWEL PROBLEMS  UNUSUAL RASH Items with * indicate a potential emergency and should be followed up as soon as possible.  Feel free to call the clinic should you have any questions or concerns. The clinic phone number is [phone].  Please show the CHEMO ALERT CARD at check-in to the Emergency Department and triage nurse.   

## 2017-06-14 NOTE — Progress Notes (Signed)
[place] OFFICE PROGRESS NOTE  Patient Care Team: Unk Pinto, MD as PCP - General (Internal Medicine) Lafayette Dragon, MD (Inactive) as Consulting Physician (Gastroenterology) Loletha Carrow, MD (Pulmonary Disease) Calvert Cantor, MD as Consulting Physician (Ophthalmology) Heath Lark, MD as Consulting Physician (Hematology and Oncology) Rozetta Nunnery, MD as Consulting Physician (Otolaryngology)  ASSESSMENT & PLAN:  Follicular lymphoma grade iiia, lymph nodes of head, face, and neck [facility]) Her last PET/CT scan showed complete response to treatment. We will proceed with consolidation treatment with maintenance rituximab every 60 days for 2 years. Due to no signs of disease, I would defer imaging study until later in the year.  Hypokalemia She has no further recurrence of hypokalemia I recommend discontinuation of potassium supplement but continue on magnesium supplement I gave her a sheet of patient education handout in regards to potassium rich diet   No orders of the defined types were placed in this encounter.   INTERVAL HISTORY: Please see below for problem oriented charting. She returns for further follow-up She denies recent infection No new lymphadenopathy Her appetite is stable, no recent weight loss, anorexia or night sweats She did not have any infusion reaction   SUMMARY OF ONCOLOGIC HISTORY:   Follicular lymphoma grade iiia, lymph nodes of head, face, and neck ([place])   01/29/2016 Imaging    Ct neck showed right submandibular lymphadenopathy and asymmetric prominence of jugular chain nodes. Metastatic disease is the primary diagnostic concern and submandibular node sampling is recommended. No primary lesion is seen, limited at the oral cavity due to dental artifact. 2. Emphysema      02/04/2016 Pathology Results    Accession: [id number] Biopsy showed high grade follicular lymphoma      02/24/2016 PET scan    Hypermetabolic right  submandibular and right level II/III lymph nodes, consistent with the given history of lymphoma. No hypermetabolic lymph nodes in the chest, abdomen or pelvis. Aortic atherosclerosis (ICD10-170.0). Coronary artery calcification. Emphysema (ICD10-J43.9).      02/24/2016 Imaging    ECHO showed LV The cavity size was normal. Systolic function was   normal. The estimated ejection fraction was in the range of 55% to 60%. Wall motion was normal; there were no regional wall motion abnormalities. Doppler parameters are consistent with abnormal left ventricular relaxation (grade 1 diastolic dysfunction).      03/13/2016 - 04/24/2016 Chemotherapy    The patient had R-CHOP chemo x 3 cycles      05/27/2016 PET scan    No metabolic activity associated with the RIGHT submandibular gland or RIGHT cervical lymph nodes consistent with a complete response. 2. Bilateral pulmonary nodules are not changed from comparison exam. 3. Diffuse mild esophagus metabolic activity suggests mild esophagitis.       REVIEW OF SYSTEMS:   Constitutional: Denies fevers, chills or abnormal weight loss Eyes: Denies blurriness of vision Ears, nose, mouth, throat, and face: Denies mucositis or sore throat Respiratory: Denies cough, dyspnea or wheezes Cardiovascular: Denies palpitation, chest discomfort or lower extremity swelling Gastrointestinal:  Denies nausea, heartburn or change in bowel habits Skin: Denies abnormal skin rashes Lymphatics: Denies new lymphadenopathy or easy bruising Neurological:Denies numbness, tingling or new weaknesses Behavioral/Psych: Mood is stable, no new changes  All other systems were reviewed with the patient and are negative.  I have reviewed the past medical history, past surgical history, social history and family history with the patient and they are unchanged from previous note.  ALLERGIES:  has No Known Allergies.  MEDICATIONS:  Current Outpatient Medications  Medication Sig Dispense  Refill  . aspirin EC 81 MG tablet Take 81 mg by mouth daily.    . Cholecalciferol 4000 UNITS CAPS Take 1 capsule by mouth daily.    Marland Kitchen gabapentin (NEURONTIN) 300 MG capsule TAKE 1 CAPSULE BY MOUTH 3 TO 4 TIMES DAILY FOR PAIN 360 capsule 1  . Magnesium 300 MG CAPS Take 1 capsule by mouth 3 (three) times daily.     Marland Kitchen omeprazole (PRILOSEC) 40 MG capsule TAKE ONE CAPSULE BY MOUTH ONCE DAILY FOR  ACID  REFLUX 90 capsule 1  . Tiotropium Bromide-Olodaterol (STIOLTO RESPIMAT) 2.5-2.5 MCG/ACT AERS Inhale 2 Act into the lungs daily. 12 g 4   No current facility-administered medications for this visit.    Facility-Administered Medications Ordered in Other Visits  Medication Dose Route Frequency Provider Last Rate Last Dose  . 0.9 %  sodium chloride infusion   Intravenous Once Alvy Bimler, Nadim Malia, MD      . acetaminophen (TYLENOL) tablet 650 mg  650 mg Oral Once Alvy Bimler, Ryliegh Mcduffey, MD      . famotidine (PEPCID) IVPB 20 mg premix  20 mg Intravenous Q12H Keontre Defino, MD      . riTUXimab (RITUXAN) 600 mg in sodium chloride 0.9 % 190 mL chemo infusion  375 mg/m2 (Treatment Plan Recorded) Intravenous Once Alvy Bimler, Ashanta Amoroso, MD        PHYSICAL EXAMINATION: ECOG PERFORMANCE STATUS: 0 - Asymptomatic  Vitals:   06/14/17 0841  BP: (!) 142/81  Pulse: 83  Resp: 18  Temp: 97.9 F (36.6 C)  SpO2: 96%   Filed Weights   06/14/17 0841  Weight: 123 lb (55.8 kg)    GENERAL:alert, no distress and comfortable SKIN: skin color, texture, turgor are normal, no rashes or significant lesions EYES: normal, Conjunctiva are pink and non-injected, sclera clear OROPHARYNX:no exudate, no erythema and lips, buccal mucosa, and tongue normal  NECK: supple, thyroid normal size, non-tender, without nodularity LYMPH:  no palpable lymphadenopathy in the cervical, axillary or inguinal LUNGS: clear to auscultation and percussion with normal breathing effort HEART: regular rate & rhythm and no murmurs and no lower extremity edema ABDOMEN:abdomen  soft, non-tender and normal bowel sounds Musculoskeletal:no cyanosis of digits and no clubbing  NEURO: alert & oriented x 3 with fluent speech, no focal motor/sensory deficits  LABORATORY DATA:  I have reviewed the data as listed    Component Value Date/Time   NA 141 06/14/2017 0815   NA 142 02/12/2017 0809   K 3.6 06/14/2017 0815   K 4.0 02/12/2017 0809   CL 104 06/14/2017 0815   CO2 28 06/14/2017 0815   CO2 28 02/12/2017 0809   GLUCOSE 109 06/14/2017 0815   GLUCOSE 97 02/12/2017 0809   BUN 10 06/14/2017 0815   BUN 11.2 02/12/2017 0809   CREATININE 0.93 06/14/2017 0815   CREATININE 0.84 02/22/2017 1502   CREATININE 0.9 02/12/2017 0809   CALCIUM 9.2 06/14/2017 0815   CALCIUM 9.3 02/12/2017 0809   PROT 6.7 06/14/2017 0815   PROT 6.7 02/12/2017 0809   ALBUMIN 3.7 06/14/2017 0815   ALBUMIN 3.8 02/12/2017 0809   AST 12 06/14/2017 0815   AST 16 02/12/2017 0809   ALT 12 06/14/2017 0815   ALT 15 02/12/2017 0809   ALKPHOS 93 06/14/2017 0815   ALKPHOS 88 02/12/2017 0809   BILITOT 0.4 06/14/2017 0815   BILITOT 0.40 02/12/2017 0809   GFRNONAA 59 (L) 06/14/2017 0815   GFRNONAA 68 02/22/2017 1502   GFRAA >60 06/14/2017  0815   GFRAA 79 02/22/2017 1502    No results found for: SPEP, UPEP  Lab Results  Component Value Date   WBC 5.8 06/14/2017   NEUTROABS 4.6 06/14/2017   HGB 12.7 06/14/2017   HCT 38.7 06/14/2017   MCV 95.3 06/14/2017   PLT 174 06/14/2017      Chemistry      Component Value Date/Time   NA 141 06/14/2017 0815   NA 142 02/12/2017 0809   K 3.6 06/14/2017 0815   K 4.0 02/12/2017 0809   CL 104 06/14/2017 0815   CO2 28 06/14/2017 0815   CO2 28 02/12/2017 0809   BUN 10 06/14/2017 0815   BUN 11.2 02/12/2017 0809   CREATININE 0.93 06/14/2017 0815   CREATININE 0.84 02/22/2017 1502   CREATININE 0.9 02/12/2017 0809      Component Value Date/Time   CALCIUM 9.2 06/14/2017 0815   CALCIUM 9.3 02/12/2017 0809   ALKPHOS 93 06/14/2017 0815   ALKPHOS 88  02/12/2017 0809   AST 12 06/14/2017 0815   AST 16 02/12/2017 0809   ALT 12 06/14/2017 0815   ALT 15 02/12/2017 0809   BILITOT 0.4 06/14/2017 0815   BILITOT 0.40 02/12/2017 0809      All questions were answered. The patient knows to call the clinic with any problems, questions or concerns. No barriers to learning was detected.  I spent 10 minutes counseling the patient face to face. The total time spent in the appointment was 15 minutes and more than 50% was on counseling and review of test results  Heath Lark, MD 06/14/2017 9:16 AM

## 2017-06-14 NOTE — Telephone Encounter (Signed)
Gave avs and calendar for may  °

## 2017-06-14 NOTE — Assessment & Plan Note (Signed)
Her last PET/CT scan showed complete response to treatment. We will proceed with consolidation treatment with maintenance rituximab every 60 days for 2 years. Due to no signs of disease, I would defer imaging study until later in the year.

## 2017-08-05 DIAGNOSIS — I6789 Other cerebrovascular disease: Secondary | ICD-10-CM | POA: Diagnosis not present

## 2017-08-05 DIAGNOSIS — G4733 Obstructive sleep apnea (adult) (pediatric): Secondary | ICD-10-CM | POA: Diagnosis not present

## 2017-08-05 DIAGNOSIS — R5381 Other malaise: Secondary | ICD-10-CM | POA: Diagnosis not present

## 2017-08-05 DIAGNOSIS — J9690 Respiratory failure, unspecified, unspecified whether with hypoxia or hypercapnia: Secondary | ICD-10-CM | POA: Diagnosis not present

## 2017-08-05 DIAGNOSIS — J449 Chronic obstructive pulmonary disease, unspecified: Secondary | ICD-10-CM | POA: Diagnosis not present

## 2017-08-13 ENCOUNTER — Telehealth: Payer: Self-pay | Admitting: Hematology and Oncology

## 2017-08-13 ENCOUNTER — Inpatient Hospital Stay (HOSPITAL_BASED_OUTPATIENT_CLINIC_OR_DEPARTMENT_OTHER): Payer: Medicare HMO | Admitting: Hematology and Oncology

## 2017-08-13 ENCOUNTER — Inpatient Hospital Stay: Payer: Medicare HMO

## 2017-08-13 ENCOUNTER — Inpatient Hospital Stay: Payer: Medicare HMO | Attending: Hematology and Oncology

## 2017-08-13 ENCOUNTER — Encounter: Payer: Self-pay | Admitting: Hematology and Oncology

## 2017-08-13 VITALS — BP 130/91 | HR 70 | Temp 97.9°F | Resp 17

## 2017-08-13 DIAGNOSIS — Z79899 Other long term (current) drug therapy: Secondary | ICD-10-CM | POA: Insufficient documentation

## 2017-08-13 DIAGNOSIS — C8231 Follicular lymphoma grade IIIa, lymph nodes of head, face, and neck: Secondary | ICD-10-CM | POA: Insufficient documentation

## 2017-08-13 DIAGNOSIS — Z7982 Long term (current) use of aspirin: Secondary | ICD-10-CM | POA: Insufficient documentation

## 2017-08-13 DIAGNOSIS — Z5112 Encounter for antineoplastic immunotherapy: Secondary | ICD-10-CM | POA: Diagnosis not present

## 2017-08-13 DIAGNOSIS — R030 Elevated blood-pressure reading, without diagnosis of hypertension: Secondary | ICD-10-CM | POA: Diagnosis not present

## 2017-08-13 LAB — CBC WITH DIFFERENTIAL/PLATELET
BASOS PCT: 1 %
Basophils Absolute: 0 10*3/uL (ref 0.0–0.1)
Eosinophils Absolute: 0.1 10*3/uL (ref 0.0–0.5)
Eosinophils Relative: 2 %
HEMATOCRIT: 37.4 % (ref 34.8–46.6)
Hemoglobin: 12.4 g/dL (ref 11.6–15.9)
LYMPHS ABS: 0.7 10*3/uL — AB (ref 0.9–3.3)
Lymphocytes Relative: 16 %
MCH: 31.2 pg (ref 25.1–34.0)
MCHC: 33 g/dL (ref 31.5–36.0)
MCV: 94.4 fL (ref 79.5–101.0)
MONO ABS: 0.3 10*3/uL (ref 0.1–0.9)
MONOS PCT: 7 %
NEUTROS ABS: 3.3 10*3/uL (ref 1.5–6.5)
Neutrophils Relative %: 74 %
Platelets: 183 10*3/uL (ref 145–400)
RBC: 3.96 MIL/uL (ref 3.70–5.45)
RDW: 14 % (ref 11.2–14.5)
WBC: 4.4 10*3/uL (ref 3.9–10.3)

## 2017-08-13 LAB — COMPREHENSIVE METABOLIC PANEL
ALT: 16 U/L (ref 0–55)
ANION GAP: 7 (ref 3–11)
AST: 18 U/L (ref 5–34)
Albumin: 3.9 g/dL (ref 3.5–5.0)
Alkaline Phosphatase: 97 U/L (ref 40–150)
BILIRUBIN TOTAL: 0.3 mg/dL (ref 0.2–1.2)
BUN: 9 mg/dL (ref 7–26)
CALCIUM: 9.2 mg/dL (ref 8.4–10.4)
CO2: 30 mmol/L — ABNORMAL HIGH (ref 22–29)
Chloride: 105 mmol/L (ref 98–109)
Creatinine, Ser: 1.02 mg/dL (ref 0.60–1.10)
GFR, EST NON AFRICAN AMERICAN: 52 mL/min — AB (ref >60)
Glucose, Bld: 87 mg/dL (ref 70–140)
POTASSIUM: 3.2 mmol/L — AB (ref 3.5–5.1)
Sodium: 142 mmol/L (ref 136–145)
TOTAL PROTEIN: 6.8 g/dL (ref 6.4–8.3)

## 2017-08-13 MED ORDER — ACETAMINOPHEN 325 MG PO TABS
ORAL_TABLET | ORAL | Status: AC
  Filled 2017-08-13: qty 2

## 2017-08-13 MED ORDER — SODIUM CHLORIDE 0.9 % IV SOLN
Freq: Once | INTRAVENOUS | Status: AC
  Administered 2017-08-13: 10:00:00 via INTRAVENOUS

## 2017-08-13 MED ORDER — SODIUM CHLORIDE 0.9 % IV SOLN
375.0000 mg/m2 | Freq: Once | INTRAVENOUS | Status: AC
  Administered 2017-08-13: 600 mg via INTRAVENOUS
  Filled 2017-08-13: qty 50

## 2017-08-13 MED ORDER — FAMOTIDINE IN NACL 20-0.9 MG/50ML-% IV SOLN
INTRAVENOUS | Status: AC
  Filled 2017-08-13: qty 50

## 2017-08-13 MED ORDER — FAMOTIDINE IN NACL 20-0.9 MG/50ML-% IV SOLN
20.0000 mg | Freq: Once | INTRAVENOUS | Status: AC
  Administered 2017-08-13: 20 mg via INTRAVENOUS

## 2017-08-13 MED ORDER — ACETAMINOPHEN 325 MG PO TABS
650.0000 mg | ORAL_TABLET | Freq: Once | ORAL | Status: AC
  Administered 2017-08-13: 650 mg via ORAL

## 2017-08-13 NOTE — Patient Instructions (Signed)
[facility] Discharge Instructions for Patients Receiving Chemotherapy  Today you received the following chemotherapy agents:  RITUXAN.  To help prevent nausea and vomiting after your treatment, we encourage you to take your nausea medication as directed.   If you develop nausea and vomiting that is not controlled by your nausea medication, call the clinic.   BELOW ARE SYMPTOMS THAT SHOULD BE REPORTED IMMEDIATELY:  *FEVER GREATER THAN 100.5 F  *CHILLS WITH OR WITHOUT FEVER  NAUSEA AND VOMITING THAT IS NOT CONTROLLED WITH YOUR NAUSEA MEDICATION  *UNUSUAL SHORTNESS OF BREATH  *UNUSUAL BRUISING OR BLEEDING  TENDERNESS IN MOUTH AND THROAT WITH OR WITHOUT PRESENCE OF ULCERS  *URINARY PROBLEMS  *BOWEL PROBLEMS  UNUSUAL RASH Items with * indicate a potential emergency and should be followed up as soon as possible.  Feel free to call the clinic should you have any questions or concerns. The clinic phone number is [phone].  Please show the CHEMO ALERT CARD at check-in to the Emergency Department and triage nurse.   

## 2017-08-13 NOTE — Telephone Encounter (Signed)
Gave avs and calendar ° °

## 2017-08-13 NOTE — Assessment & Plan Note (Signed)
Her last PET/CT scan in February 2018 showed complete response to treatment. We will proceed with consolidation treatment with maintenance rituximab every 60 days for 2 years. Due to no signs of disease, I would defer imaging study until later in the year.

## 2017-08-13 NOTE — Progress Notes (Signed)
[place] OFFICE PROGRESS NOTE  Patient Care Team: Unk Pinto, MD as PCP - General (Internal Medicine) Lafayette Dragon, MD (Inactive) as Consulting Physician (Gastroenterology) Loletha Carrow, MD (Pulmonary Disease) Calvert Cantor, MD as Consulting Physician (Ophthalmology) Heath Lark, MD as Consulting Physician (Hematology and Oncology) Rozetta Nunnery, MD as Consulting Physician (Otolaryngology)  ASSESSMENT & PLAN:  Follicular lymphoma grade iiia, lymph nodes of head, face, and neck [facility]) Her last PET/CT scan in February 2018 showed complete response to treatment. We will proceed with consolidation treatment with maintenance rituximab every 60 days for 2 years. Due to no signs of disease, I would defer imaging study until later in the year.  Elevated BP without diagnosis of hypertension She is noted to have intermittent elevated blood pressure but is not taking any medications She has lost some weight recently Continue close follow-up with primary care doctor   No orders of the defined types were placed in this encounter.   INTERVAL HISTORY: Please see below for problem oriented charting. She returns for chemotherapy and follow-up She tolerated treatment well Denies infusion reaction No recent infection No new lymphadenopathy  SUMMARY OF ONCOLOGIC HISTORY:   Follicular lymphoma grade iiia, lymph nodes of head, face, and neck ([place])   01/29/2016 Imaging    Ct neck showed right submandibular lymphadenopathy and asymmetric prominence of jugular chain nodes. Metastatic disease is the primary diagnostic concern and submandibular node sampling is recommended. No primary lesion is seen, limited at the oral cavity due to dental artifact. 2. Emphysema      02/04/2016 Pathology Results    Accession: [id number] Biopsy showed high grade follicular lymphoma      02/24/2016 PET scan    Hypermetabolic right submandibular and right level II/III lymph nodes,  consistent with the given history of lymphoma. No hypermetabolic lymph nodes in the chest, abdomen or pelvis. Aortic atherosclerosis (ICD10-170.0). Coronary artery calcification. Emphysema (ICD10-J43.9).      02/24/2016 Imaging    ECHO showed LV The cavity size was normal. Systolic function was   normal. The estimated ejection fraction was in the range of 55% to 60%. Wall motion was normal; there were no regional wall motion abnormalities. Doppler parameters are consistent with abnormal left ventricular relaxation (grade 1 diastolic dysfunction).      03/13/2016 - 04/24/2016 Chemotherapy    The patient had R-CHOP chemo x 3 cycles      05/27/2016 PET scan    No metabolic activity associated with the RIGHT submandibular gland or RIGHT cervical lymph nodes consistent with a complete response. 2. Bilateral pulmonary nodules are not changed from comparison exam. 3. Diffuse mild esophagus metabolic activity suggests mild esophagitis.       REVIEW OF SYSTEMS:   Constitutional: Denies fevers, chills or abnormal weight loss Eyes: Denies blurriness of vision Ears, nose, mouth, throat, and face: Denies mucositis or sore throat Respiratory: Denies cough, dyspnea or wheezes Cardiovascular: Denies palpitation, chest discomfort or lower extremity swelling Gastrointestinal:  Denies nausea, heartburn or change in bowel habits Skin: Denies abnormal skin rashes Lymphatics: Denies new lymphadenopathy or easy bruising Neurological:Denies numbness, tingling or new weaknesses Behavioral/Psych: Mood is stable, no new changes  All other systems were reviewed with the patient and are negative.  I have reviewed the past medical history, past surgical history, social history and family history with the patient and they are unchanged from previous note.  ALLERGIES:  has No Known Allergies.  MEDICATIONS:  Current Outpatient Medications  Medication Sig Dispense Refill  .  aspirin EC 81 MG tablet Take 81 mg by  mouth daily.    . Cholecalciferol 4000 UNITS CAPS Take 1 capsule by mouth daily.    Marland Kitchen gabapentin (NEURONTIN) 300 MG capsule TAKE 1 CAPSULE BY MOUTH 3 TO 4 TIMES DAILY FOR PAIN 360 capsule 1  . Magnesium 300 MG CAPS Take 1 capsule by mouth 3 (three) times daily.     Marland Kitchen omeprazole (PRILOSEC) 40 MG capsule TAKE ONE CAPSULE BY MOUTH ONCE DAILY FOR  ACID  REFLUX 90 capsule 1  . Tiotropium Bromide-Olodaterol (STIOLTO RESPIMAT) 2.5-2.5 MCG/ACT AERS Inhale 2 Act into the lungs daily. 12 g 4   No current facility-administered medications for this visit.     PHYSICAL EXAMINATION: ECOG PERFORMANCE STATUS: 0 - Asymptomatic  Vitals:   08/13/17 0821  BP: (!) 147/74  Pulse: 83  Resp: 18  Temp: 98 F (36.7 C)  SpO2: 97%   Filed Weights   08/13/17 0821  Weight: 116 lb 6.4 oz (52.8 kg)    GENERAL:alert, no distress and comfortable SKIN: skin color, texture, turgor are normal, no rashes or significant lesions EYES: normal, Conjunctiva are pink and non-injected, sclera clear OROPHARYNX:no exudate, no erythema and lips, buccal mucosa, and tongue normal  NECK: supple, thyroid normal size, non-tender, without nodularity LYMPH:  no palpable lymphadenopathy in the cervical, axillary or inguinal LUNGS: clear to auscultation and percussion with normal breathing effort HEART: regular rate & rhythm and no murmurs and no lower extremity edema ABDOMEN:abdomen soft, non-tender and normal bowel sounds Musculoskeletal:no cyanosis of digits and no clubbing  NEURO: alert & oriented x 3 with fluent speech, no focal motor/sensory deficits  LABORATORY DATA:  I have reviewed the data as listed    Component Value Date/Time   NA 141 06/14/2017 0815   NA 142 02/12/2017 0809   K 3.6 06/14/2017 0815   K 4.0 02/12/2017 0809   CL 104 06/14/2017 0815   CO2 28 06/14/2017 0815   CO2 28 02/12/2017 0809   GLUCOSE 109 06/14/2017 0815   GLUCOSE 97 02/12/2017 0809   BUN 10 06/14/2017 0815   BUN 11.2 02/12/2017 0809    CREATININE 0.93 06/14/2017 0815   CREATININE 0.84 02/22/2017 1502   CREATININE 0.9 02/12/2017 0809   CALCIUM 9.2 06/14/2017 0815   CALCIUM 9.3 02/12/2017 0809   PROT 6.7 06/14/2017 0815   PROT 6.7 02/12/2017 0809   ALBUMIN 3.7 06/14/2017 0815   ALBUMIN 3.8 02/12/2017 0809   AST 12 06/14/2017 0815   AST 16 02/12/2017 0809   ALT 12 06/14/2017 0815   ALT 15 02/12/2017 0809   ALKPHOS 93 06/14/2017 0815   ALKPHOS 88 02/12/2017 0809   BILITOT 0.4 06/14/2017 0815   BILITOT 0.40 02/12/2017 0809   GFRNONAA 59 (L) 06/14/2017 0815   GFRNONAA 68 02/22/2017 1502   GFRAA >60 06/14/2017 0815   GFRAA 79 02/22/2017 1502    No results found for: SPEP, UPEP  Lab Results  Component Value Date   WBC 4.4 08/13/2017   NEUTROABS 3.3 08/13/2017   HGB 12.4 08/13/2017   HCT 37.4 08/13/2017   MCV 94.4 08/13/2017   PLT 183 08/13/2017      Chemistry      Component Value Date/Time   NA 141 06/14/2017 0815   NA 142 02/12/2017 0809   K 3.6 06/14/2017 0815   K 4.0 02/12/2017 0809   CL 104 06/14/2017 0815   CO2 28 06/14/2017 0815   CO2 28 02/12/2017 0809   BUN 10 06/14/2017  0815   BUN 11.2 02/12/2017 0809   CREATININE 0.93 06/14/2017 0815   CREATININE 0.84 02/22/2017 1502   CREATININE 0.9 02/12/2017 0809      Component Value Date/Time   CALCIUM 9.2 06/14/2017 0815   CALCIUM 9.3 02/12/2017 0809   ALKPHOS 93 06/14/2017 0815   ALKPHOS 88 02/12/2017 0809   AST 12 06/14/2017 0815   AST 16 02/12/2017 0809   ALT 12 06/14/2017 0815   ALT 15 02/12/2017 0809   BILITOT 0.4 06/14/2017 0815   BILITOT 0.40 02/12/2017 0809      All questions were answered. The patient knows to call the clinic with any problems, questions or concerns. No barriers to learning was detected.  I spent 10 minutes counseling the patient face to face. The total time spent in the appointment was 15 minutes and more than 50% was on counseling and review of test results  Heath Lark, MD 08/13/2017 8:42 AM

## 2017-08-13 NOTE — Assessment & Plan Note (Signed)
She is noted to have intermittent elevated blood pressure but is not taking any medications She has lost some weight recently Continue close follow-up with primary care doctor

## 2017-08-18 ENCOUNTER — Encounter: Payer: Self-pay | Admitting: Internal Medicine

## 2017-08-18 ENCOUNTER — Ambulatory Visit (INDEPENDENT_AMBULATORY_CARE_PROVIDER_SITE_OTHER): Payer: Medicare HMO | Admitting: Internal Medicine

## 2017-08-18 VITALS — BP 120/76 | HR 69 | Temp 97.6°F | Resp 16 | Ht 62.5 in | Wt 116.0 lb

## 2017-08-18 DIAGNOSIS — Z79899 Other long term (current) drug therapy: Secondary | ICD-10-CM | POA: Diagnosis not present

## 2017-08-18 DIAGNOSIS — C8231 Follicular lymphoma grade IIIa, lymph nodes of head, face, and neck: Secondary | ICD-10-CM | POA: Diagnosis not present

## 2017-08-18 DIAGNOSIS — J449 Chronic obstructive pulmonary disease, unspecified: Secondary | ICD-10-CM | POA: Diagnosis not present

## 2017-08-18 DIAGNOSIS — I10 Essential (primary) hypertension: Secondary | ICD-10-CM | POA: Diagnosis not present

## 2017-08-18 DIAGNOSIS — E559 Vitamin D deficiency, unspecified: Secondary | ICD-10-CM

## 2017-08-18 DIAGNOSIS — R7309 Other abnormal glucose: Secondary | ICD-10-CM

## 2017-08-18 DIAGNOSIS — N182 Chronic kidney disease, stage 2 (mild): Secondary | ICD-10-CM

## 2017-08-18 DIAGNOSIS — K219 Gastro-esophageal reflux disease without esophagitis: Secondary | ICD-10-CM

## 2017-08-18 DIAGNOSIS — E782 Mixed hyperlipidemia: Secondary | ICD-10-CM

## 2017-08-18 DIAGNOSIS — G4733 Obstructive sleep apnea (adult) (pediatric): Secondary | ICD-10-CM

## 2017-08-18 DIAGNOSIS — R7303 Prediabetes: Secondary | ICD-10-CM | POA: Diagnosis not present

## 2017-08-18 NOTE — Patient Instructions (Signed)

## 2017-08-18 NOTE — Progress Notes (Signed)
This very nice 77 y.o. WWF presents for 6 month follow up with HTN, HLD, Pre-Diabetes, COPD and Vitamin D Deficiency. Patient is s/p Tx for a Stage IIIa Follicular Lymphoma of the Rt neck received Chemo per Dr Alvy Bimler. Patient also has COPD and OSA/CPAP overlap. Patient has GERD controled w/Prilosec.     Patient is treated for HTN (1985) & BP has been controlled at home. Today's BP is at goal - 120/76. Patient has had no complaints of any cardiac type chest pain, palpitations, dyspnea / orthopnea / PND, dizziness, claudication, or dependent edema.     Hyperlipidemia is not controlled with diet & she's off of her Crestor 20 mg that previously controlled her lipids. Patient denies myalgias or other med SE's. Last Lipids were  Lab Results  Component Value Date   CHOL 226 (H) 08/18/2017   HDL 53 08/18/2017   LDLCALC 137 (H) 08/18/2017   TRIG 217 (H) 08/18/2017   CHOLHDL 4.3 08/18/2017      Also, the patient is followed expectantly for PreDiabetes and has had no symptoms of reactive hypoglycemia, diabetic polys, paresthesias or visual blurring.  Last A1c was Normal & at goal:  Lab Results  Component Value Date   HGBA1C 5.4 08/18/2017      Further, the patient also has history of Vitamin D Deficiency ("44" on treatment in 2009) and supplements vitamin D without any suspected side-effects. Last vitamin D was   Lab Results  Component Value Date   VD25OH 77 08/18/2017   Current Outpatient Medications on File Prior to Visit  Medication Sig  . aspirin EC 81 MG tablet Take  daily.  . Cholecalciferol 4000 UNITS  Take 1 cap daily.  Marland Kitchen gabapentin  300 MG TAKE 1 CAP 3 TO 4 TIMES DAILY FOR PAIN  . Magnesium 300 MG CAPS Take 1 cap 3  times daily.   Marland Kitchen omeprazole (PRILOSEC) 40 MG c TAKE ONE CAPS  DAILY FOR  ACID  REFLUX  . STIOLTO RESPIMAT 2.5-2.5 Inhale 2 Act into the lungs daily.   No Known Allergies   PMHx:   Past Medical History:  Diagnosis Date  . Anxiety   . COPD (chronic obstructive  pulmonary disease) ([place])   . Diverticulosis   . GERD (gastroesophageal reflux disease)   . Hx of adenomatous colonic polyps   . Hyperlipidemia   . Hypertension   . Hypomagnesemia   . Neuropathy   . Peripheral neuropathy   . Prediabetes   . RLS (restless legs syndrome)   . Sleep apnea    CPAP  . Vitamin B12 deficiency   . Vitamin D deficiency    Immunization History  Administered Date(s) Administered  . Influenza Split 01/18/2013  . Influenza, High Dose Seasonal PF 12/05/2014, 01/23/2016, 12/22/2016  . Pneumococcal Conjugate-13 11/27/2013  . Pneumococcal Polysaccharide-23 11/19/2011  . Td 08/13/2008  . Zoster 04/06/2010   Past Surgical History:  Procedure Laterality Date  . APPENDECTOMY    . COLONOSCOPY N/A 05/18/2014   Procedure: COLONOSCOPY;  Surgeon: Lafayette Dragon, MD;  Location: [facility];  Service: Endoscopy;  Laterality: N/A;  . DIRECT LARYNGOSCOPY Right 02/04/2016   Procedure: DIRECT LARYNGOSCOPY;  Surgeon: Rozetta Nunnery, MD;  Location: [place];  Service: ENT;  Laterality: Right;  . EYE SURGERY  2017   Bilateral cataract Dr. Bing Plume  . LYMPH NODE BIOPSY Right 02/04/2016   Procedure: EXCISIONAL BIOPSY RIGHT NECK LYMPH NODE;  Surgeon: Rozetta Nunnery, MD;  Location:  [place];  Service: ENT;  Laterality: Right;  . TONSILLECTOMY     FHx:    Reviewed / unchanged  SHx:    Reviewed / unchanged   Systems Review:  Constitutional: Denies fever, chills, wt changes, headaches, insomnia, fatigue, night sweats, change in appetite. Eyes: Denies redness, blurred vision, diplopia, discharge, itchy, watery eyes.  ENT: Denies discharge, congestion, post nasal drip, epistaxis, sore throat, earache, hearing loss, dental pain, tinnitus, vertigo, sinus pain, snoring.  CV: Denies chest pain, palpitations, irregular heartbeat, syncope, dyspnea, diaphoresis, orthopnea, PND, claudication or edema. Respiratory: denies cough, dyspnea, DOE,  pleurisy, hoarseness, laryngitis, wheezing.  Gastrointestinal: Denies dysphagia, odynophagia, heartburn, reflux, water brash, abdominal pain or cramps, nausea, vomiting, bloating, diarrhea, constipation, hematemesis, melena, hematochezia  or hemorrhoids. Genitourinary: Denies dysuria, frequency, urgency, nocturia, hesitancy, discharge, hematuria or flank pain. Musculoskeletal: Denies arthralgias, myalgias, stiffness, jt. swelling, pain, limping or strain/sprain.  Skin: Denies pruritus, rash, hives, warts, acne, eczema or change in skin lesion(s). Neuro: No weakness, tremor, incoordination, spasms, paresthesia or pain. Psychiatric: Denies confusion, memory loss or sensory loss. Endo: Denies change in weight, skin or hair change.  Heme/Lymph: No excessive bleeding, bruising or enlarged lymph nodes.  Physical Exam  BP 120/76   Pulse 69   Temp 97.6 F (36.4 C)   Resp 16   Ht 5' 2.5" (1.588 m)   Wt 116 lb (52.6 kg)   SpO2 96%   BMI 20.88 kg/m   Appears  well nourished, well groomed  and in no distress.  Eyes: PERRLA, EOMs, conjunctiva no swelling or erythema. Sinuses: No frontal/maxillary tenderness ENT/Mouth: EAC's clear, TM's nl w/o erythema, bulging. Nares clear w/o erythema, swelling, exudates. Oropharynx clear without erythema or exudates. Oral hygiene is good. Tongue normal, non obstructing. Hearing intact.  Neck: Supple. Thyroid not palpable. Car 2+/2+ without bruits, nodes or JVD. Chest: Respirations nl with BS clear & equal w/o rales, rhonchi, wheezing or stridor.  Cor: Heart sounds normal w/ regular rate and rhythm without sig. murmurs, gallops, clicks or rubs. Peripheral pulses normal and equal  without edema.  Abdomen: Soft & bowel sounds normal. Non-tender w/o guarding, rebound, hernias, masses or organomegaly.  Lymphatics: Unremarkable.  Musculoskeletal: Full ROM all peripheral extremities, joint stability, 5/5 strength and normal gait.  Skin: Warm, dry without exposed  rashes, lesions or ecchymosis apparent.  Neuro: Cranial nerves intact, reflexes equal bilaterally. Sensory-motor testing grossly intact. Tendon reflexes grossly intact.  Pysch: Alert & oriented x 3.  Insight and judgement nl & appropriate. No ideations.  Assessment and Plan:  1. Essential hypertension  - Continue medication, monitor blood pressure at home.  - Continue DASH diet.  Reminder to go to the ER if any CP,  SOB, nausea, dizziness, severe HA, changes vision/speech.  - COMPLETE METABOLIC PANEL WITH GFR - Magnesium - TSH  2. Hyperlipidemia, mixed  - Continue diet/meds, exercise,& lifestyle modifications.  - Continue monitor periodic cholesterol/liver & renal functions   - Lipid panel - TSH  3. Abnormal glucose  - Continue diet, exercise, lifestyle modifications.  - Monitor appropriate labs.  - Hemoglobin A1c - Insulin, random  4. Vitamin D deficiency  - Continue supplementation.  - VITAMIN D 25 Hydroxyl  5. Prediabetes  - Hemoglobin A1c - Insulin, random  6. CKD (chronic kidney disease) stage 2, GFR 60-89 ml/min  - COMPLETE METABOLIC PANEL WITH GFR  7. Gastroesophageal reflux disease, esophagitis presence not specified   8. Follicular lymphoma grade III a, lymph nodes of head, face, and  neck ([place])   9. OSA and COPD overlap syndrome ([place])   10. Medication management  - COMPLETE METABOLIC PANEL WITH GFR - Magnesium - Lipid panel - TSH - Hemoglobin A1c - Insulin, random - VITAMIN D 25 Hydroxyl           Discussed  regular exercise, BP monitoring, weight control to achieve/maintain BMI less than 25 and discussed med and SE's. Recommended labs to assess and monitor clinical status with further disposition pending results of labs. Over 30 minutes of exam, counseling, chart review was performed.

## 2017-08-19 ENCOUNTER — Other Ambulatory Visit: Payer: Self-pay | Admitting: Internal Medicine

## 2017-08-19 LAB — INSULIN, RANDOM: Insulin: 9.3 u[IU]/mL (ref 2.0–19.6)

## 2017-08-19 LAB — COMPLETE METABOLIC PANEL WITH GFR
AG RATIO: 1.7 (calc) (ref 1.0–2.5)
ALT: 13 U/L (ref 6–29)
AST: 18 U/L (ref 10–35)
Albumin: 4.5 g/dL (ref 3.6–5.1)
Alkaline phosphatase (APISO): 101 U/L (ref 33–130)
BILIRUBIN TOTAL: 0.4 mg/dL (ref 0.2–1.2)
BUN/Creatinine Ratio: 11 (calc) (ref 6–22)
BUN: 11 mg/dL (ref 7–25)
CHLORIDE: 103 mmol/L (ref 98–110)
CO2: 34 mmol/L — ABNORMAL HIGH (ref 20–32)
Calcium: 10.3 mg/dL (ref 8.6–10.4)
Creat: 0.97 mg/dL — ABNORMAL HIGH (ref 0.60–0.93)
GFR, EST AFRICAN AMERICAN: 66 mL/min/{1.73_m2} (ref >=60)
GFR, Est Non African American: 57 mL/min/{1.73_m2} — ABNORMAL LOW (ref >=60)
GLOBULIN: 2.6 g/dL (ref 1.9–3.7)
Glucose, Bld: 88 mg/dL (ref 65–99)
POTASSIUM: 5.5 mmol/L — AB (ref 3.5–5.3)
Sodium: 144 mmol/L (ref 135–146)
TOTAL PROTEIN: 7.1 g/dL (ref 6.1–8.1)

## 2017-08-19 LAB — LIPID PANEL
CHOL/HDL RATIO: 4.3 (calc) (ref <5.0)
Cholesterol: 226 mg/dL — ABNORMAL HIGH (ref <200)
HDL: 53 mg/dL (ref >50)
LDL Cholesterol (Calc): 137 mg/dL (calc) — ABNORMAL HIGH
NON-HDL CHOLESTEROL (CALC): 173 mg/dL — AB (ref <130)
Triglycerides: 217 mg/dL — ABNORMAL HIGH (ref <150)

## 2017-08-19 LAB — VITAMIN D 25 HYDROXY (VIT D DEFICIENCY, FRACTURES): VIT D 25 HYDROXY: 77 ng/mL (ref 30–100)

## 2017-08-19 LAB — HEMOGLOBIN A1C
Hgb A1c MFr Bld: 5.4 % of total Hgb (ref <5.7)
MEAN PLASMA GLUCOSE: 108 (calc)
eAG (mmol/L): 6 (calc)

## 2017-08-19 LAB — MAGNESIUM: Magnesium: 1.9 mg/dL (ref 1.5–2.5)

## 2017-08-19 LAB — TSH: TSH: 1.65 mIU/L (ref 0.40–4.50)

## 2017-08-19 MED ORDER — ROSUVASTATIN CALCIUM 40 MG PO TABS
ORAL_TABLET | ORAL | 5 refills | Status: DC
Start: 2017-08-19 — End: 2018-08-10

## 2017-09-14 ENCOUNTER — Telehealth: Payer: Self-pay | Admitting: Hematology and Oncology

## 2017-09-14 NOTE — Telephone Encounter (Signed)
Tried to call regarding voicemail  °

## 2017-10-12 ENCOUNTER — Inpatient Hospital Stay: Payer: Medicare HMO | Attending: Hematology and Oncology

## 2017-10-12 ENCOUNTER — Ambulatory Visit (HOSPITAL_COMMUNITY)
Admission: RE | Admit: 2017-10-12 | Discharge: 2017-10-12 | Disposition: A | Discharge status: Home / Self Care | Payer: Medicare HMO | Source: Ambulatory Visit | Attending: Hematology and Oncology | Admitting: Hematology and Oncology

## 2017-10-12 ENCOUNTER — Inpatient Hospital Stay: Payer: Medicare HMO

## 2017-10-12 ENCOUNTER — Telehealth: Payer: Self-pay | Admitting: Hematology and Oncology

## 2017-10-12 ENCOUNTER — Inpatient Hospital Stay (HOSPITAL_BASED_OUTPATIENT_CLINIC_OR_DEPARTMENT_OTHER): Payer: Medicare HMO | Admitting: Hematology and Oncology

## 2017-10-12 ENCOUNTER — Other Ambulatory Visit: Payer: Self-pay | Admitting: Hematology and Oncology

## 2017-10-12 ENCOUNTER — Encounter: Payer: Self-pay | Admitting: Hematology and Oncology

## 2017-10-12 VITALS — BP 127/70 | HR 75 | Temp 97.9°F | Resp 18 | Ht 62.5 in | Wt 114.2 lb

## 2017-10-12 VITALS — BP 149/82 | HR 62 | Temp 97.8°F | Resp 18

## 2017-10-12 DIAGNOSIS — M898X1 Other specified disorders of bone, shoulder: Secondary | ICD-10-CM

## 2017-10-12 DIAGNOSIS — M898X2 Other specified disorders of bone, upper arm: Secondary | ICD-10-CM

## 2017-10-12 DIAGNOSIS — M19011 Primary osteoarthritis, right shoulder: Secondary | ICD-10-CM | POA: Insufficient documentation

## 2017-10-12 DIAGNOSIS — C8231 Follicular lymphoma grade IIIa, lymph nodes of head, face, and neck: Secondary | ICD-10-CM | POA: Diagnosis not present

## 2017-10-12 DIAGNOSIS — Z7982 Long term (current) use of aspirin: Secondary | ICD-10-CM | POA: Diagnosis not present

## 2017-10-12 DIAGNOSIS — M79621 Pain in right upper arm: Secondary | ICD-10-CM | POA: Insufficient documentation

## 2017-10-12 DIAGNOSIS — Z79899 Other long term (current) drug therapy: Secondary | ICD-10-CM | POA: Diagnosis not present

## 2017-10-12 DIAGNOSIS — Z5112 Encounter for antineoplastic immunotherapy: Secondary | ICD-10-CM | POA: Diagnosis not present

## 2017-10-12 DIAGNOSIS — M19012 Primary osteoarthritis, left shoulder: Secondary | ICD-10-CM | POA: Diagnosis not present

## 2017-10-12 LAB — CBC WITH DIFFERENTIAL/PLATELET
BASOS ABS: 0 10*3/uL (ref 0.0–0.1)
Basophils Relative: 1 %
EOS PCT: 2 %
Eosinophils Absolute: 0.1 10*3/uL (ref 0.0–0.5)
HEMATOCRIT: 37 % (ref 34.8–46.6)
Hemoglobin: 12.3 g/dL (ref 11.6–15.9)
LYMPHS ABS: 0.7 10*3/uL — AB (ref 0.9–3.3)
Lymphocytes Relative: 18 %
MCH: 31.5 pg (ref 25.1–34.0)
MCHC: 33.3 g/dL (ref 31.5–36.0)
MCV: 94.4 fL (ref 79.5–101.0)
MONOS PCT: 8 %
Monocytes Absolute: 0.3 10*3/uL (ref 0.1–0.9)
Neutro Abs: 2.8 10*3/uL (ref 1.5–6.5)
Neutrophils Relative %: 71 %
PLATELETS: 140 10*3/uL — AB (ref 145–400)
RBC: 3.92 MIL/uL (ref 3.70–5.45)
RDW: 14.2 % (ref 11.2–14.5)
WBC: 3.9 10*3/uL (ref 3.9–10.3)

## 2017-10-12 LAB — COMPREHENSIVE METABOLIC PANEL
ALK PHOS: 67 U/L (ref 38–126)
ALT: 16 U/L (ref 0–44)
ANION GAP: 10 (ref 5–15)
AST: 17 U/L (ref 15–41)
Albumin: 4.1 g/dL (ref 3.5–5.0)
BUN: 9 mg/dL (ref 8–23)
CALCIUM: 9.5 mg/dL (ref 8.9–10.3)
CO2: 31 mmol/L (ref 22–32)
CREATININE: 0.92 mg/dL (ref 0.44–1.00)
Chloride: 103 mmol/L (ref 98–111)
GFR, EST NON AFRICAN AMERICAN: 59 mL/min — AB (ref >60)
Glucose, Bld: 100 mg/dL — ABNORMAL HIGH (ref 70–99)
Potassium: 3.4 mmol/L — ABNORMAL LOW (ref 3.5–5.1)
SODIUM: 144 mmol/L (ref 135–145)
Total Bilirubin: 0.4 mg/dL (ref 0.3–1.2)
Total Protein: 6.6 g/dL (ref 6.5–8.1)

## 2017-10-12 MED ORDER — SODIUM CHLORIDE 0.9 % IV SOLN
375.0000 mg/m2 | Freq: Once | INTRAVENOUS | Status: AC
  Administered 2017-10-12: 600 mg via INTRAVENOUS
  Filled 2017-10-12: qty 50

## 2017-10-12 MED ORDER — ACETAMINOPHEN 325 MG PO TABS
ORAL_TABLET | ORAL | Status: AC
  Filled 2017-10-12: qty 2

## 2017-10-12 MED ORDER — FAMOTIDINE IN NACL 20-0.9 MG/50ML-% IV SOLN
20.0000 mg | Freq: Once | INTRAVENOUS | Status: AC
  Administered 2017-10-12: 20 mg via INTRAVENOUS

## 2017-10-12 MED ORDER — ACETAMINOPHEN 325 MG PO TABS
650.0000 mg | ORAL_TABLET | Freq: Once | ORAL | Status: AC
  Administered 2017-10-12: 650 mg via ORAL

## 2017-10-12 MED ORDER — FAMOTIDINE IN NACL 20-0.9 MG/50ML-% IV SOLN
INTRAVENOUS | Status: AC
  Filled 2017-10-12: qty 50

## 2017-10-12 MED ORDER — SODIUM CHLORIDE 0.9 % IV SOLN
Freq: Once | INTRAVENOUS | Status: AC
  Administered 2017-10-12: 10:00:00 via INTRAVENOUS

## 2017-10-12 NOTE — Assessment & Plan Note (Signed)
She complained of pain on the left scapula Examination is benign She is afraid of cancer recurrence I will start with x-ray of her scapula for evaluation

## 2017-10-12 NOTE — Patient Instructions (Signed)
[facility] Discharge Instructions for Patients Receiving Chemotherapy  Today you received the following chemotherapy agents:  Rituxan.  To help prevent nausea and vomiting after your treatment, we encourage you to take your nausea medication as directed.   If you develop nausea and vomiting that is not controlled by your nausea medication, call the clinic.   BELOW ARE SYMPTOMS THAT SHOULD BE REPORTED IMMEDIATELY:  *FEVER GREATER THAN 100.5 F  *CHILLS WITH OR WITHOUT FEVER  NAUSEA AND VOMITING THAT IS NOT CONTROLLED WITH YOUR NAUSEA MEDICATION  *UNUSUAL SHORTNESS OF BREATH  *UNUSUAL BRUISING OR BLEEDING  TENDERNESS IN MOUTH AND THROAT WITH OR WITHOUT PRESENCE OF ULCERS  *URINARY PROBLEMS  *BOWEL PROBLEMS  UNUSUAL RASH Items with * indicate a potential emergency and should be followed up as soon as possible.  Feel free to call the clinic should you have any questions or concerns. The clinic phone number is [phone].  Please show the CHEMO ALERT CARD at check-in to the Emergency Department and triage nurse.   

## 2017-10-12 NOTE — Assessment & Plan Note (Signed)
She complained of significant pain affecting her right upper arm I will order x-ray of the shoulder and the humerus for further evaluation

## 2017-10-12 NOTE — Telephone Encounter (Signed)
Gave patient avs and calendar of upcoming appts.  °

## 2017-10-12 NOTE — Assessment & Plan Note (Addendum)
Her last PET/CT scan in February 2018 showed complete response to treatment. We will proceed with consolidation treatment with maintenance rituximab every 60 days for 2 years, last dose January 2020 Due to no signs of disease, I would defer imaging study until later in the year. She has no signs or symptoms of cancer recurrence We will proceed with treatment today without delay

## 2017-10-12 NOTE — Progress Notes (Signed)
[place] OFFICE PROGRESS NOTE  Patient Care Team: Unk Pinto, MD as PCP - General (Internal Medicine) Lafayette Dragon, MD (Inactive) as Consulting Physician (Gastroenterology) Loletha Carrow, MD (Pulmonary Disease) Calvert Cantor, MD as Consulting Physician (Ophthalmology) Heath Lark, MD as Consulting Physician (Hematology and Oncology) Rozetta Nunnery, MD as Consulting Physician (Otolaryngology)  ASSESSMENT & PLAN:  Follicular lymphoma grade iiia, lymph nodes of head, face, and neck [facility]) Her last PET/CT scan in February 2018 showed complete response to treatment. We will proceed with consolidation treatment with maintenance rituximab every 60 days for 2 years, last dose January 2020 Due to no signs of disease, I would defer imaging study until later in the year. She has no signs or symptoms of cancer recurrence We will proceed with treatment today without delay  Pain of left scapula She complained of pain on the left scapula Examination is benign She is afraid of cancer recurrence I will start with x-ray of her scapula for evaluation  Pain of right humerus She complained of significant pain affecting her right upper arm I will order x-ray of the shoulder and the humerus for further evaluation   Orders Placed This Encounter  Procedures  . DG Humerus Right    Standing Status:   Future    Number of Occurrences:   1    Standing Expiration Date:   12/14/2018    Order Specific Question:   Reason for Exam (SYMPTOM  OR DIAGNOSIS REQUIRED)    Answer:   right humerus pain    Order Specific Question:   Preferred imaging location?    Answer:   [facility]    Order Specific Question:   Radiology Contrast Protocol - do NOT remove file path    Answer:   \\[email or web address]s.pdf  . DG Arthro Shoulder Right    Standing Status:   Future    Standing Expiration Date:   12/14/2018    Order Specific Question:   Reason for Exam  (SYMPTOM  OR DIAGNOSIS REQUIRED)    Answer:   right shoulder and arm pain    Order Specific Question:   Preferred Imaging Location?    Answer:   [facility]    Order Specific Question:   Radiology Contrast Protocol - do NOT remove file path    Answer:   \\[email or web address]s.pdf  . DG Scapula Left    Standing Status:   Future    Number of Occurrences:   1    Standing Expiration Date:   12/14/2018    Order Specific Question:   Reason for Exam (SYMPTOM  OR DIAGNOSIS REQUIRED)    Answer:   left scapula pain    Order Specific Question:   Preferred imaging location?    Answer:   [facility]    Order Specific Question:   Radiology Contrast Protocol - do NOT remove file path    Answer:   \\[email or web address]s.[email or web address]    INTERVAL HISTORY: Please see below for problem oriented charting. She returns for chemotherapy and follow-up She denies recent new lymphadenopathy No recent infection Appetite is stable without recent weight change She complained of some intermittent pain over the left scapula and her right upper arm for unknown reason She denies recent fall or injuries  SUMMARY OF ONCOLOGIC HISTORY:   Follicular lymphoma grade iiia, lymph nodes of head, face, and neck ([place])   01/29/2016 Imaging    Ct neck showed right submandibular lymphadenopathy and asymmetric prominence of jugular chain nodes. Metastatic disease  is the primary diagnostic concern and submandibular node sampling is recommended. No primary lesion is seen, limited at the oral cavity due to dental artifact. 2. Emphysema      02/04/2016 Pathology Results    Accession: [id number] Biopsy showed high grade follicular lymphoma      02/24/2016 PET scan    Hypermetabolic right submandibular and right level II/III lymph nodes, consistent with the given history of lymphoma. No hypermetabolic lymph nodes in the chest, abdomen or pelvis. Aortic  atherosclerosis (ICD10-170.0). Coronary artery calcification. Emphysema (ICD10-J43.9).      02/24/2016 Imaging    ECHO showed LV The cavity size was normal. Systolic function was   normal. The estimated ejection fraction was in the range of 55% to 60%. Wall motion was normal; there were no regional wall motion abnormalities. Doppler parameters are consistent with abnormal left ventricular relaxation (grade 1 diastolic dysfunction).      03/13/2016 - 04/24/2016 Chemotherapy    The patient had R-CHOP chemo x 3 cycles      05/27/2016 PET scan    No metabolic activity associated with the RIGHT submandibular gland or RIGHT cervical lymph nodes consistent with a complete response. 2. Bilateral pulmonary nodules are not changed from comparison exam. 3. Diffuse mild esophagus metabolic activity suggests mild esophagitis.       REVIEW OF SYSTEMS:   Constitutional: Denies fevers, chills or abnormal weight loss Eyes: Denies blurriness of vision Ears, nose, mouth, throat, and face: Denies mucositis or sore throat Respiratory: Denies cough, dyspnea or wheezes Cardiovascular: Denies palpitation, chest discomfort or lower extremity swelling Gastrointestinal:  Denies nausea, heartburn or change in bowel habits Skin: Denies abnormal skin rashes Lymphatics: Denies new lymphadenopathy or easy bruising Neurological:Denies numbness, tingling or new weaknesses Behavioral/Psych: Mood is stable, no new changes  All other systems were reviewed with the patient and are negative.  I have reviewed the past medical history, past surgical history, social history and family history with the patient and they are unchanged from previous note.  ALLERGIES:  has No Known Allergies.  MEDICATIONS:  Current Outpatient Medications  Medication Sig Dispense Refill  . aspirin EC 81 MG tablet Take 81 mg by mouth daily.    . Cholecalciferol 4000 UNITS CAPS Take 1 capsule by mouth daily.    Marland Kitchen gabapentin (NEURONTIN) 300 MG  capsule TAKE 1 CAPSULE BY MOUTH 3 TO 4 TIMES DAILY FOR PAIN 360 capsule 1  . Magnesium 300 MG CAPS Take 1 capsule by mouth 3 (three) times daily.     Marland Kitchen omeprazole (PRILOSEC) 40 MG capsule TAKE ONE CAPSULE BY MOUTH ONCE DAILY FOR  ACID  REFLUX 90 capsule 1  . rosuvastatin (CRESTOR) 40 MG tablet Take 1/2 to 1 tablet daily or as directed for Cholesterol 30 tablet 5  . Tiotropium Bromide-Olodaterol (STIOLTO RESPIMAT) 2.5-2.5 MCG/ACT AERS Inhale 2 Act into the lungs daily. 12 g 4   No current facility-administered medications for this visit.     PHYSICAL EXAMINATION: ECOG PERFORMANCE STATUS: 1 - Symptomatic but completely ambulatory  Vitals:   10/12/17 0840  BP: 127/70  Pulse: 75  Resp: 18  Temp: 97.9 F (36.6 C)  SpO2: 93%   Filed Weights   10/12/17 0840  Weight: 114 lb 3.2 oz (51.8 kg)    GENERAL:alert, no distress and comfortable SKIN: skin color, texture, turgor are normal, no rashes or significant lesions EYES: normal, Conjunctiva are pink and non-injected, sclera clear OROPHARYNX:no exudate, no erythema and lips, buccal mucosa, and tongue normal  NECK:  supple, thyroid normal size, non-tender, without nodularity LYMPH:  no palpable lymphadenopathy in the cervical, axillary or inguinal LUNGS: clear to auscultation and percussion with normal breathing effort HEART: regular rate & rhythm and no murmurs and no lower extremity edema ABDOMEN:abdomen soft, non-tender and normal bowel sounds Musculoskeletal:no cyanosis of digits and no clubbing.  She has some mild tenderness over the left scapula and the right humerus NEURO: alert & oriented x 3 with fluent speech, no focal motor/sensory deficits  LABORATORY DATA:  I have reviewed the data as listed    Component Value Date/Time   NA 144 10/12/2017 0812   NA 142 02/12/2017 0809   K 3.4 (L) 10/12/2017 0812   K 4.0 02/12/2017 0809   CL 103 10/12/2017 0812   CO2 31 10/12/2017 0812   CO2 28 02/12/2017 0809   GLUCOSE 100 (H)  10/12/2017 0812   GLUCOSE 97 02/12/2017 0809   BUN 9 10/12/2017 0812   BUN 11.2 02/12/2017 0809   CREATININE 0.92 10/12/2017 0812   CREATININE 0.97 (H) 08/18/2017 1451   CREATININE 0.9 02/12/2017 0809   CALCIUM 9.5 10/12/2017 0812   CALCIUM 9.3 02/12/2017 0809   PROT 6.6 10/12/2017 0812   PROT 6.7 02/12/2017 0809   ALBUMIN 4.1 10/12/2017 0812   ALBUMIN 3.8 02/12/2017 0809   AST 17 10/12/2017 0812   AST 16 02/12/2017 0809   ALT 16 10/12/2017 0812   ALT 15 02/12/2017 0809   ALKPHOS 67 10/12/2017 0812   ALKPHOS 88 02/12/2017 0809   BILITOT 0.4 10/12/2017 0812   BILITOT 0.40 02/12/2017 0809   GFRNONAA 59 (L) 10/12/2017 0812   GFRNONAA 57 (L) 08/18/2017 1451   GFRAA >60 10/12/2017 0812   GFRAA 66 08/18/2017 1451    No results found for: SPEP, UPEP  Lab Results  Component Value Date   WBC 3.9 10/12/2017   NEUTROABS 2.8 10/12/2017   HGB 12.3 10/12/2017   HCT 37.0 10/12/2017   MCV 94.4 10/12/2017   PLT 140 (L) 10/12/2017      Chemistry      Component Value Date/Time   NA 144 10/12/2017 0812   NA 142 02/12/2017 0809   K 3.4 (L) 10/12/2017 0812   K 4.0 02/12/2017 0809   CL 103 10/12/2017 0812   CO2 31 10/12/2017 0812   CO2 28 02/12/2017 0809   BUN 9 10/12/2017 0812   BUN 11.2 02/12/2017 0809   CREATININE 0.92 10/12/2017 0812   CREATININE 0.97 (H) 08/18/2017 1451   CREATININE 0.9 02/12/2017 0809      Component Value Date/Time   CALCIUM 9.5 10/12/2017 0812   CALCIUM 9.3 02/12/2017 0809   ALKPHOS 67 10/12/2017 0812   ALKPHOS 88 02/12/2017 0809   AST 17 10/12/2017 0812   AST 16 02/12/2017 0809   ALT 16 10/12/2017 0812   ALT 15 02/12/2017 0809   BILITOT 0.4 10/12/2017 0812   BILITOT 0.40 02/12/2017 0809       All questions were answered. The patient knows to call the clinic with any problems, questions or concerns. No barriers to learning was detected.  I spent 15 minutes counseling the patient face to face. The total time spent in the appointment was 20  minutes and more than 50% was on counseling and review of test results  Heath Lark, MD 10/12/2017 2:39 PM

## 2017-10-13 ENCOUNTER — Telehealth: Payer: Self-pay | Admitting: *Deleted

## 2017-10-13 NOTE — Telephone Encounter (Signed)
Notified of message below

## 2017-10-13 NOTE — Telephone Encounter (Signed)
-----   Message from Heath Lark, MD sent at 10/13/2017  8:19 AM EDT ----- Regarding: X ray Pls call her X ray showed no bone lesions Right shoulder X ray showed some joint changes, could be due to arthritis. If pain bothers her much, recommend orthopedic evaluation

## 2017-10-15 ENCOUNTER — Telehealth: Payer: Self-pay | Admitting: *Deleted

## 2017-10-15 ENCOUNTER — Other Ambulatory Visit: Payer: Self-pay | Admitting: Internal Medicine

## 2017-10-15 MED ORDER — PREDNISONE 20 MG PO TABS: ORAL_TABLET | ORAL | 0 refills | Status: DC

## 2017-10-15 MED ORDER — AZITHROMYCIN 250 MG PO TABS: ORAL_TABLET | ORAL | 0 refills | Status: DC

## 2017-10-15 NOTE — Telephone Encounter (Signed)
Patient called and states she has a cough and a sore throat x 2 days.  Dr Melford Aase sent in RX's for a Z-pak and Prednisone 20 mg to the patient's pharmacy, The patient is aware.

## 2017-10-20 ENCOUNTER — Emergency Department (HOSPITAL_BASED_OUTPATIENT_CLINIC_OR_DEPARTMENT_OTHER): Payer: Medicare HMO

## 2017-10-20 ENCOUNTER — Encounter (HOSPITAL_BASED_OUTPATIENT_CLINIC_OR_DEPARTMENT_OTHER): Payer: Self-pay

## 2017-10-20 ENCOUNTER — Other Ambulatory Visit: Payer: Self-pay

## 2017-10-20 ENCOUNTER — Inpatient Hospital Stay (HOSPITAL_BASED_OUTPATIENT_CLINIC_OR_DEPARTMENT_OTHER)
Admission: EM | Admit: 2017-10-20 | Discharge: 2017-10-26 | DRG: 469 | Disposition: A | Discharge status: Skilled Nursing Facility | Payer: Medicare HMO | Source: Other Acute Inpatient Hospital | Attending: Internal Medicine | Admitting: Internal Medicine

## 2017-10-20 DIAGNOSIS — S72002A Fracture of unspecified part of neck of left femur, initial encounter for closed fracture: Secondary | ICD-10-CM | POA: Diagnosis not present

## 2017-10-20 DIAGNOSIS — Y92009 Unspecified place in unspecified non-institutional (private) residence as the place of occurrence of the external cause: Secondary | ICD-10-CM

## 2017-10-20 DIAGNOSIS — W010XXA Fall on same level from slipping, tripping and stumbling without subsequent striking against object, initial encounter: Secondary | ICD-10-CM | POA: Diagnosis present

## 2017-10-20 DIAGNOSIS — G4730 Sleep apnea, unspecified: Secondary | ICD-10-CM | POA: Diagnosis present

## 2017-10-20 DIAGNOSIS — Z96642 Presence of left artificial hip joint: Secondary | ICD-10-CM | POA: Diagnosis not present

## 2017-10-20 DIAGNOSIS — Z9842 Cataract extraction status, left eye: Secondary | ICD-10-CM

## 2017-10-20 DIAGNOSIS — W010XXD Fall on same level from slipping, tripping and stumbling without subsequent striking against object, subsequent encounter: Secondary | ICD-10-CM | POA: Diagnosis not present

## 2017-10-20 DIAGNOSIS — N179 Acute kidney failure, unspecified: Secondary | ICD-10-CM | POA: Diagnosis present

## 2017-10-20 DIAGNOSIS — Z96649 Presence of unspecified artificial hip joint: Secondary | ICD-10-CM

## 2017-10-20 DIAGNOSIS — S72042A Displaced fracture of base of neck of left femur, initial encounter for closed fracture: Secondary | ICD-10-CM | POA: Diagnosis not present

## 2017-10-20 DIAGNOSIS — G629 Polyneuropathy, unspecified: Secondary | ICD-10-CM | POA: Diagnosis present

## 2017-10-20 DIAGNOSIS — J9601 Acute respiratory failure with hypoxia: Secondary | ICD-10-CM | POA: Diagnosis present

## 2017-10-20 DIAGNOSIS — Z8572 Personal history of non-Hodgkin lymphomas: Secondary | ICD-10-CM

## 2017-10-20 DIAGNOSIS — J441 Chronic obstructive pulmonary disease with (acute) exacerbation: Secondary | ICD-10-CM

## 2017-10-20 DIAGNOSIS — M6281 Muscle weakness (generalized): Secondary | ICD-10-CM | POA: Diagnosis not present

## 2017-10-20 DIAGNOSIS — K5900 Constipation, unspecified: Secondary | ICD-10-CM | POA: Diagnosis present

## 2017-10-20 DIAGNOSIS — J439 Emphysema, unspecified: Secondary | ICD-10-CM | POA: Diagnosis present

## 2017-10-20 DIAGNOSIS — I10 Essential (primary) hypertension: Secondary | ICD-10-CM | POA: Diagnosis present

## 2017-10-20 DIAGNOSIS — Z9049 Acquired absence of other specified parts of digestive tract: Secondary | ICD-10-CM | POA: Diagnosis not present

## 2017-10-20 DIAGNOSIS — R739 Hyperglycemia, unspecified: Secondary | ICD-10-CM | POA: Diagnosis present

## 2017-10-20 DIAGNOSIS — S72001S Fracture of unspecified part of neck of right femur, sequela: Secondary | ICD-10-CM | POA: Diagnosis present

## 2017-10-20 DIAGNOSIS — M2550 Pain in unspecified joint: Secondary | ICD-10-CM | POA: Diagnosis not present

## 2017-10-20 DIAGNOSIS — N182 Chronic kidney disease, stage 2 (mild): Secondary | ICD-10-CM | POA: Diagnosis not present

## 2017-10-20 DIAGNOSIS — Z86010 Personal history of colonic polyps: Secondary | ICD-10-CM

## 2017-10-20 DIAGNOSIS — J449 Chronic obstructive pulmonary disease, unspecified: Secondary | ICD-10-CM | POA: Diagnosis not present

## 2017-10-20 DIAGNOSIS — J209 Acute bronchitis, unspecified: Secondary | ICD-10-CM | POA: Diagnosis not present

## 2017-10-20 DIAGNOSIS — R918 Other nonspecific abnormal finding of lung field: Secondary | ICD-10-CM | POA: Diagnosis present

## 2017-10-20 DIAGNOSIS — G2581 Restless legs syndrome: Secondary | ICD-10-CM | POA: Diagnosis present

## 2017-10-20 DIAGNOSIS — R0602 Shortness of breath: Secondary | ICD-10-CM | POA: Diagnosis not present

## 2017-10-20 DIAGNOSIS — R000 Tachycardia, unspecified: Secondary | ICD-10-CM | POA: Diagnosis not present

## 2017-10-20 DIAGNOSIS — D62 Acute posthemorrhagic anemia: Secondary | ICD-10-CM | POA: Diagnosis not present

## 2017-10-20 DIAGNOSIS — Z9841 Cataract extraction status, right eye: Secondary | ICD-10-CM

## 2017-10-20 DIAGNOSIS — E785 Hyperlipidemia, unspecified: Secondary | ICD-10-CM | POA: Diagnosis present

## 2017-10-20 DIAGNOSIS — Z87891 Personal history of nicotine dependence: Secondary | ICD-10-CM | POA: Diagnosis not present

## 2017-10-20 DIAGNOSIS — Z9989 Dependence on other enabling machines and devices: Secondary | ICD-10-CM | POA: Diagnosis not present

## 2017-10-20 DIAGNOSIS — M4854XA Collapsed vertebra, not elsewhere classified, thoracic region, initial encounter for fracture: Secondary | ICD-10-CM | POA: Diagnosis present

## 2017-10-20 DIAGNOSIS — Z7982 Long term (current) use of aspirin: Secondary | ICD-10-CM

## 2017-10-20 DIAGNOSIS — W19XXXA Unspecified fall, initial encounter: Secondary | ICD-10-CM | POA: Diagnosis not present

## 2017-10-20 DIAGNOSIS — S72002D Fracture of unspecified part of neck of left femur, subsequent encounter for closed fracture with routine healing: Secondary | ICD-10-CM | POA: Diagnosis not present

## 2017-10-20 DIAGNOSIS — Z7401 Bed confinement status: Secondary | ICD-10-CM | POA: Diagnosis not present

## 2017-10-20 DIAGNOSIS — K219 Gastro-esophageal reflux disease without esophagitis: Secondary | ICD-10-CM | POA: Diagnosis not present

## 2017-10-20 DIAGNOSIS — Z419 Encounter for procedure for purposes other than remedying health state, unspecified: Secondary | ICD-10-CM

## 2017-10-20 DIAGNOSIS — Z9089 Acquired absence of other organs: Secondary | ICD-10-CM | POA: Diagnosis not present

## 2017-10-20 DIAGNOSIS — S72091A Other fracture of head and neck of right femur, initial encounter for closed fracture: Secondary | ICD-10-CM | POA: Diagnosis not present

## 2017-10-20 DIAGNOSIS — I129 Hypertensive chronic kidney disease with stage 1 through stage 4 chronic kidney disease, or unspecified chronic kidney disease: Secondary | ICD-10-CM | POA: Diagnosis not present

## 2017-10-20 DIAGNOSIS — Z471 Aftercare following joint replacement surgery: Secondary | ICD-10-CM | POA: Diagnosis not present

## 2017-10-20 DIAGNOSIS — R0902 Hypoxemia: Secondary | ICD-10-CM

## 2017-10-20 DIAGNOSIS — Z823 Family history of stroke: Secondary | ICD-10-CM

## 2017-10-20 DIAGNOSIS — R262 Difficulty in walking, not elsewhere classified: Secondary | ICD-10-CM | POA: Diagnosis not present

## 2017-10-20 LAB — CBC WITH DIFFERENTIAL/PLATELET
BASOS ABS: 0 10*3/uL (ref 0.0–0.1)
Basophils Relative: 0 %
EOS PCT: 1 %
Eosinophils Absolute: 0.1 10*3/uL (ref 0.0–0.7)
HCT: 38.4 % (ref 36.0–46.0)
HEMOGLOBIN: 12.7 g/dL (ref 12.0–15.0)
LYMPHS ABS: 0.9 10*3/uL (ref 0.7–4.0)
LYMPHS PCT: 10 %
MCH: 31.8 pg (ref 26.0–34.0)
MCHC: 33.1 g/dL (ref 30.0–36.0)
MCV: 96.2 fL (ref 78.0–100.0)
Monocytes Absolute: 0.6 10*3/uL (ref 0.1–1.0)
Monocytes Relative: 7 %
NEUTROS ABS: 6.9 10*3/uL (ref 1.7–7.7)
NEUTROS PCT: 82 %
PLATELETS: 184 10*3/uL (ref 150–400)
RBC: 3.99 MIL/uL (ref 3.87–5.11)
RDW: 13.3 % (ref 11.5–15.5)
WBC: 8.5 10*3/uL (ref 4.0–10.5)

## 2017-10-20 LAB — BASIC METABOLIC PANEL
ANION GAP: 13 (ref 5–15)
BUN: 18 mg/dL (ref 8–23)
CHLORIDE: 96 mmol/L — AB (ref 98–111)
CO2: 28 mmol/L (ref 22–32)
Calcium: 9.4 mg/dL (ref 8.9–10.3)
Creatinine, Ser: 1.13 mg/dL — ABNORMAL HIGH (ref 0.44–1.00)
GFR, EST AFRICAN AMERICAN: 54 mL/min — AB (ref >60)
GFR, EST NON AFRICAN AMERICAN: 46 mL/min — AB (ref >60)
Glucose, Bld: 102 mg/dL — ABNORMAL HIGH (ref 70–99)
POTASSIUM: 3.9 mmol/L (ref 3.5–5.1)
SODIUM: 137 mmol/L (ref 135–145)

## 2017-10-20 LAB — PROTIME-INR
INR: 0.92
PROTHROMBIN TIME: 12.3 s (ref 11.4–15.2)

## 2017-10-20 LAB — BRAIN NATRIURETIC PEPTIDE: B Natriuretic Peptide: 45.3 pg/mL (ref 0.0–100.0)

## 2017-10-20 LAB — TROPONIN I: Troponin I: <0.03 ng/mL (ref <0.03)

## 2017-10-20 MED ORDER — MAGNESIUM OXIDE 400 (241.3 MG) MG PO TABS
400.0000 mg | ORAL_TABLET | Freq: Two times a day (BID) | ORAL | Status: DC
  Administered 2017-10-21 – 2017-10-26 (×10): 400 mg via ORAL
  Filled 2017-10-20 (×10): qty 1

## 2017-10-20 MED ORDER — DOXYCYCLINE HYCLATE 100 MG PO TABS
100.0000 mg | ORAL_TABLET | Freq: Two times a day (BID) | ORAL | Status: DC
  Administered 2017-10-21 – 2017-10-26 (×11): 100 mg via ORAL
  Filled 2017-10-20 (×11): qty 1

## 2017-10-20 MED ORDER — IPRATROPIUM-ALBUTEROL 0.5-2.5 (3) MG/3ML IN SOLN
3.0000 mL | Freq: Once | RESPIRATORY_TRACT | Status: AC
  Administered 2017-10-20: 3 mL via RESPIRATORY_TRACT
  Filled 2017-10-20: qty 3

## 2017-10-20 MED ORDER — ROSUVASTATIN CALCIUM 20 MG PO TABS
20.0000 mg | ORAL_TABLET | Freq: Every day | ORAL | Status: DC
  Administered 2017-10-23 – 2017-10-26 (×4): 20 mg via ORAL
  Filled 2017-10-20 (×3): qty 1
  Filled 2017-10-20: qty 2
  Filled 2017-10-20 (×2): qty 1
  Filled 2017-10-20: qty 2
  Filled 2017-10-20: qty 1
  Filled 2017-10-20 (×2): qty 2

## 2017-10-20 MED ORDER — FENTANYL CITRATE (PF) 100 MCG/2ML IJ SOLN
50.0000 ug | INTRAMUSCULAR | Status: AC | PRN
  Administered 2017-10-20 (×2): 50 ug via INTRAVENOUS
  Filled 2017-10-20 (×2): qty 2

## 2017-10-20 MED ORDER — METHYLPREDNISOLONE SODIUM SUCC 125 MG IJ SOLR
125.0000 mg | Freq: Once | INTRAMUSCULAR | Status: AC
  Administered 2017-10-20: 125 mg via INTRAVENOUS
  Filled 2017-10-20: qty 2

## 2017-10-20 MED ORDER — MORPHINE SULFATE (PF) 2 MG/ML IV SOLN: 0.5000 mg | INTRAVENOUS | Status: DC | PRN

## 2017-10-20 MED ORDER — IOPAMIDOL (ISOVUE-370) INJECTION 76%
100.0000 mL | Freq: Once | INTRAVENOUS | Status: AC | PRN
  Administered 2017-10-20: 100 mL via INTRAVENOUS

## 2017-10-20 MED ORDER — BISACODYL 5 MG PO TBEC: 5.0000 mg | DELAYED_RELEASE_TABLET | Freq: Every day | ORAL | Status: DC | PRN

## 2017-10-20 MED ORDER — ONDANSETRON HCL 4 MG/2ML IJ SOLN
4.0000 mg | Freq: Once | INTRAMUSCULAR | Status: AC
  Administered 2017-10-20: 4 mg via INTRAVENOUS
  Filled 2017-10-20: qty 2

## 2017-10-20 MED ORDER — POLYETHYLENE GLYCOL 3350 17 G PO PACK: 17.0000 g | PACK | Freq: Every day | ORAL | Status: DC | PRN

## 2017-10-20 MED ORDER — SODIUM CHLORIDE 0.9 % IV SOLN
INTRAVENOUS | Status: AC
  Administered 2017-10-21 (×2): via INTRAVENOUS

## 2017-10-20 MED ORDER — ARFORMOTEROL TARTRATE 15 MCG/2ML IN NEBU
15.0000 ug | INHALATION_SOLUTION | Freq: Two times a day (BID) | RESPIRATORY_TRACT | Status: DC
  Administered 2017-10-21 – 2017-10-26 (×10): 15 ug via RESPIRATORY_TRACT
  Filled 2017-10-20 (×12): qty 2

## 2017-10-20 MED ORDER — METHYLPREDNISOLONE SODIUM SUCC 125 MG IJ SOLR
60.0000 mg | Freq: Four times a day (QID) | INTRAMUSCULAR | Status: DC
  Administered 2017-10-21 (×3): 60 mg via INTRAVENOUS
  Filled 2017-10-20 (×3): qty 2

## 2017-10-20 MED ORDER — UMECLIDINIUM BROMIDE 62.5 MCG/INH IN AEPB
1.0000 | INHALATION_SPRAY | Freq: Every day | RESPIRATORY_TRACT | Status: DC
  Administered 2017-10-21: 1 via RESPIRATORY_TRACT
  Filled 2017-10-20: qty 7

## 2017-10-20 MED ORDER — ALBUTEROL SULFATE (2.5 MG/3ML) 0.083% IN NEBU
2.5000 mg | INHALATION_SOLUTION | Freq: Once | RESPIRATORY_TRACT | Status: AC
  Administered 2017-10-20: 2.5 mg via RESPIRATORY_TRACT
  Filled 2017-10-20: qty 3

## 2017-10-20 MED ORDER — GABAPENTIN 300 MG PO CAPS
300.0000 mg | ORAL_CAPSULE | Freq: Three times a day (TID) | ORAL | Status: DC | PRN
  Administered 2017-10-21: 300 mg via ORAL
  Filled 2017-10-20: qty 1

## 2017-10-20 MED ORDER — HYDROCODONE-ACETAMINOPHEN 5-325 MG PO TABS: 1.0000 | ORAL_TABLET | Freq: Four times a day (QID) | ORAL | Status: DC | PRN

## 2017-10-20 MED ORDER — PANTOPRAZOLE SODIUM 40 MG PO TBEC
40.0000 mg | DELAYED_RELEASE_TABLET | Freq: Every day | ORAL | Status: DC
  Administered 2017-10-22 – 2017-10-26 (×5): 40 mg via ORAL
  Filled 2017-10-20 (×4): qty 1

## 2017-10-20 MED ORDER — VITAMIN D 1000 UNITS PO TABS
4000.0000 [IU] | ORAL_TABLET | Freq: Every day | ORAL | Status: DC
  Administered 2017-10-22 – 2017-10-26 (×5): 4000 [IU] via ORAL
  Filled 2017-10-20 (×5): qty 4

## 2017-10-20 NOTE — ED Notes (Signed)
Patient transported to X-ray 

## 2017-10-20 NOTE — H&P (Signed)
History and Physical    Renee Hawkins:[id number] DOB: 1940-07-18 DOA: 10/20/2017  PCP: Unk Pinto, MD   Patient coming from: Home, by way of [facility]   Chief Complaint: Fall with left hip pain   HPI: Renee Hawkins is a 77 y.o. female with medical history significant for COPD, now presenting to the emergency department for evaluation of left hip pain after a ground-level mechanical fall.  Patient recently developed worsening in her chronic cough and dyspnea and was started on azithromycin and prednisone on 10/15/2017.  Breathing seemed to improve some initially before worsening again over the past couple days.  She has clear sputum production.  She reports that she does not use supplemental oxygen at home.  She reports slipping just prior to arrival and landing on her left side without hitting her head or losing consciousness.  She experienced immediate and severe pain in the left hip but was able to bear weight and take a few steps with difficulty.  Pain persisted and a friend advised her to seek evaluation in the ED.  She denies any recent fevers, chills, chest pain, headache, change in vision or hearing, or focal numbness or weakness.  She is able to ascend a flight of stairs without any chest pain or severe dyspnea.   [facility] ED Course: Upon arrival to the ED, patient is found to be afebrile, saturating low 80s on room air, mildly tachypneic and tachycardic, and with stable blood pressure.  EKG features a sinus tachycardia with rate 106.  Chest x-ray is consistent with emphysema, but no acute finding.  Radiographs of the pelvis are notable for impacted fracture involving the left femoral neck.  CTA chest is negative for PE or acute finding, notable for emphysema and age-indeterminate T12 inferior endplate fracture, and stable nodules bilaterally.  Chemistry panel features a creatinine 1.13, up from priors in the 0.91 range.  CBC is unremarkable.  Patient was given 125 mg  of IV Solu-Medrol, nebs, fentanyl, and Zofran in the ED.  Orthopedic surgery was consulted by the ED physician and recommended medical admission  to [facility].  Review of Systems:  All other systems reviewed and apart from HPI, are negative.  Past Medical History:  Diagnosis Date  . Anxiety   . COPD (chronic obstructive pulmonary disease) ([place])   . Diverticulosis   . GERD (gastroesophageal reflux disease)   . Hx of adenomatous colonic polyps   . Hyperlipidemia   . Hypertension   . Hypomagnesemia   . Neuropathy   . Peripheral neuropathy   . Prediabetes   . RLS (restless legs syndrome)   . Sleep apnea    CPAP  . Vitamin B12 deficiency   . Vitamin D deficiency     Past Surgical History:  Procedure Laterality Date  . APPENDECTOMY    . COLONOSCOPY N/A 05/18/2014   Procedure: COLONOSCOPY;  Surgeon: Lafayette Dragon, MD;  Location: [facility];  Service: Endoscopy;  Laterality: N/A;  . DIRECT LARYNGOSCOPY Right 02/04/2016   Procedure: DIRECT LARYNGOSCOPY;  Surgeon: Rozetta Nunnery, MD;  Location: [place];  Service: ENT;  Laterality: Right;  . EYE SURGERY  2017   Bilateral cataract Dr. Bing Plume  . LYMPH NODE BIOPSY Right 02/04/2016   Procedure: EXCISIONAL BIOPSY RIGHT NECK LYMPH NODE;  Surgeon: Rozetta Nunnery, MD;  Location: [place];  Service: ENT;  Laterality: Right;  . TONSILLECTOMY       reports that she quit  smoking about 5 years ago. Her smoking use included cigarettes. She has a 30.00 pack-year smoking history. She has never used smokeless tobacco. She reports that she does not drink alcohol or use drugs.  No Known Allergies  Family History  Problem Relation Age of Onset  . Stroke Mother   . Stroke Maternal Grandmother   . Colon cancer Neg Hx      Prior to Admission medications   Medication Sig Start Date End Date Taking? Authorizing Provider  aspirin EC 81 MG tablet Take 81 mg by mouth daily.    [provider]  Cholecalciferol 4000 UNITS CAPS Take 1 capsule by mouth daily.    [provider]  gabapentin (NEURONTIN) 300 MG capsule TAKE 1 CAPSULE BY MOUTH 3 TO 4 TIMES DAILY FOR PAIN 05/26/17   Unk Pinto, MD  Magnesium 300 MG CAPS Take 1 capsule by mouth 3 (three) times daily.     [provider]  omeprazole (PRILOSEC) 40 MG capsule TAKE ONE CAPSULE BY MOUTH ONCE DAILY FOR  ACID  REFLUX 05/26/17   Unk Pinto, MD  predniSONE (DELTASONE) 20 MG tablet 1 tab 3 x day for 2 days, then 1 tab 2 x day for 2 days, then 1 tab 1 x day for 3 days 10/15/17   Unk Pinto, MD  rosuvastatin (CRESTOR) 40 MG tablet Take 1/2 to 1 tablet daily or as directed for Cholesterol 08/19/17   Unk Pinto, MD  Tiotropium Bromide-Olodaterol (STIOLTO RESPIMAT) 2.5-2.5 MCG/ACT AERS Inhale 2 Act into the lungs daily. 05/28/17   Vicie Mutters, PA-C  prochlorperazine (COMPAZINE) 10 MG tablet Take 1 tablet (10 mg total) by mouth every 6 (six) hours as needed (Nausea or vomiting). 02/25/16 05/28/16  Heath Lark, MD    Physical Exam: Vitals:   10/20/17 1706 10/20/17 1800 10/20/17 1824 10/20/17 2033  BP:   (!) 148/75 (!) 149/76  Pulse: (!) 107 (!) 101 99 96  Resp: 17 12 17 14   Temp:      TempSrc:      SpO2: 93% 90% (!) 88% (!) 83%  Weight:      Height:          Constitutional: No acute distress, calm  Eyes: PERTLA, lids and conjunctivae normal ENMT: Mucous membranes are moist. Posterior pharynx clear of any exudate or lesions.   Neck: normal, supple, no masses, no thyromegaly Respiratory: Breath sounds slightly diminished bilaterally with occasional expiratory wheeze. No accessory muscle use.  Cardiovascular: S1 & S2 heard, regular rate and rhythm. No extremity edema.   Abdomen: No distension, no tenderness, soft. Bowel sounds normal.  Musculoskeletal: no clubbing / cyanosis. Left hip exquisitely tender, neurovascularly intact distally.  Skin: no significant rashes, lesions,  ulcers. Warm, dry, well-perfused. Neurologic: CN 2-12 grossly intact. Sensation intact. Strength 5/5 in all 4 limbs.  Psychiatric: Alert and oriented x 3. Pleasant and cooperative.     Labs on Admission: I have personally reviewed following labs and imaging studies  CBC: Recent Labs  Lab 10/20/17 1554  WBC 8.5  NEUTROABS 6.9  HGB 12.7  HCT 38.4  MCV 96.2  PLT 696   Basic Metabolic Panel: Recent Labs  Lab 10/20/17 1554  NA 137  K 3.9  CL 96*  CO2 28  GLUCOSE 102*  BUN 18  CREATININE 1.13*  CALCIUM 9.4   GFR: Estimated Creatinine Clearance: 34.5 mL/min (A) (by C-G formula based on SCr of 1.13 mg/dL (H)). Liver Function Tests: No results for input(s): AST, ALT, ALKPHOS,  BILITOT, PROT, ALBUMIN in the last 168 hours. No results for input(s): LIPASE, AMYLASE in the last 168 hours. No results for input(s): AMMONIA in the last 168 hours. Coagulation Profile: Recent Labs  Lab 10/20/17 1644  INR 0.92   Cardiac Enzymes: Recent Labs  Lab 10/20/17 1554  TROPONINI <0.03   BNP (last 3 results) No results for input(s): PROBNP in the last 8760 hours. HbA1C: No results for input(s): HGBA1C in the last 72 hours. CBG: No results for input(s): GLUCAP in the last 168 hours. Lipid Profile: No results for input(s): CHOL, HDL, LDLCALC, TRIG, CHOLHDL, LDLDIRECT in the last 72 hours. Thyroid Function Tests: No results for input(s): TSH, T4TOTAL, FREET4, T3FREE, THYROIDAB in the last 72 hours. Anemia Panel: No results for input(s): VITAMINB12, FOLATE, FERRITIN, TIBC, IRON, RETICCTPCT in the last 72 hours. Urine analysis:    Component Value Date/Time   COLORURINE YELLOW 02/22/2017 1502   APPEARANCEUR CLEAR 02/22/2017 1502   LABSPEC 1.010 02/22/2017 1502   PHURINE 6.5 02/22/2017 1502   GLUCOSEU NEGATIVE 02/22/2017 1502   HGBUR NEGATIVE 02/22/2017 1502   BILIRUBINUR NEGATIVE 03/10/2016 1221   KETONESUR NEGATIVE 02/22/2017 1502   PROTEINUR NEGATIVE 02/22/2017 1502    UROBILINOGEN 0.2 11/27/2013 1453   NITRITE NEGATIVE 02/22/2017 1502   LEUKOCYTESUR TRACE (A) 02/22/2017 1502   Sepsis Labs: @LABRCNTIP (procalcitonin:4,lacticidven:4) )No results found for this or any previous visit (from the past 240 hour(s)).   Radiological Exams on Admission: Dg Chest 2 View  Result Date: 10/20/2017 CLINICAL DATA:  Shortness of breath today, history COPD, hypertension, former smoker, history of follicular lymphoma EXAM: CHEST - 2 VIEW COMPARISON:  11/10/2011 FINDINGS: Normal heart size, mediastinal contours, and pulmonary vascularity. Atherosclerotic calcification aorta. Emphysematous changes without pulmonary infiltrate, pleural effusion or pneumothorax. Bones demineralized. IMPRESSION: Emphysematous changes without acute abnormalities. Electronically Signed   By: Lavonia Dana M.D.   On: 10/20/2017 16:46   Ct Angio Chest Pe W And/or Wo Contrast  Result Date: 10/20/2017 CLINICAL DATA:  Cough and shortness of breath EXAM: CT ANGIOGRAPHY CHEST WITH CONTRAST TECHNIQUE: Multidetector CT imaging of the chest was performed using the standard protocol during bolus administration of intravenous contrast. Multiplanar CT image reconstructions and MIPs were obtained to evaluate the vascular anatomy. CONTRAST:  175mL ISOVUE-370 IOPAMIDOL (ISOVUE-370) INJECTION 76% COMPARISON:  Chest CT 05/26/2012 FINDINGS: Cardiovascular: --Pulmonary arteries: Contrast injection is sufficient to demonstrate satisfactory opacification of the pulmonary arteries to the segmental level. There is no pulmonary embolus. The main pulmonary artery is within normal limits for size. --Aorta: Satisfactory opacification of the thoracic aorta. No aortic dissection or other acute aortic syndrome. Normal three-vessel pattern. Multifocal atherosclerosis of the proximal left vertebral artery. The aortic course and caliber are normal. There is moderate aortic atherosclerosis. --Heart: Normal size. No pericardial effusion.  Mediastinum/Nodes: No mediastinal, hilar or axillary lymphadenopathy. Small sliding hiatal hernia. Lungs/Pleura: Unchanged 6 mm left lower lobe pulmonary nodule. Multiple peripheral nodules in the right middle lobe. Apical predominant emphysema. Multiple peripheral left lower lobe nodules are also noted, unchanged. Upper Abdomen: Contrast bolus timing is not optimized for evaluation of the abdominal organs. Within this limitation, the visualized organs of the upper abdomen are normal. Musculoskeletal: There is fracture of the inferior endplate of W10, age indeterminate. Review of the MIP images confirms the above findings. IMPRESSION: 1. No pulmonary embolus or acute aortic syndrome. 2. Aortic Atherosclerosis (ICD10-I70.0) and Emphysema (ICD10-J43.9). 3. T12 inferior endplate fracture, age indeterminate. Correlate for history of trauma. Thoracic spine MRI may be helpful for  further temporal characterization. 4. Multiple bilateral pulmonary nodules, measuring up to 6 mm, unchanged. Electronically Signed   By: Ulyses Jarred M.D.   On: 10/20/2017 19:51   Dg Hip Unilat W Or Wo Pelvis 2-3 Views Left  Result Date: 10/20/2017 CLINICAL DATA:  Fall with left hip pain EXAM: DG HIP (WITH OR WITHOUT PELVIS) 2-3V LEFT COMPARISON:  None. FINDINGS: There is an impacted fracture of the left femoral neck. Femoral head remains approximated in the acetabulum. IMPRESSION: Impacted fracture of the left femoral neck. Electronically Signed   By: Ulyses Jarred M.D.   On: 10/20/2017 16:36    EKG: Independently reviewed. Sinus tachycardia (rate 106).   Assessment/Plan   1. Left hip fracture  - Presents with left hip pain after a ground-level mechanical fall; she denies hitting head or losing consciousness   - Radiographs reveal impacted left femoral neck fracture  - Orthopedic surgery is consulting and much appreciated  - Based on the available data, Ms. Kinner presents an estimated 0.75% risk for perioperative MI or cardiac  arrest  - Hold ASA, keep NPO after midnight, continue supportive care with prn analgesia and gentle IVF hydration  2. COPD with acute exacerbation; acute hypoxic respiratory failure  - Started prednisone and azithromycin on 7/12 for acute COPD exacerbation and noted to have new supplemental O2-requirement and significant wheezing in ED  - Treated with 125 mg IV Solu-Medrol and nebs in ED  - Check sputum culture, continue systemic steroid, continue supplemental O2, continue inhalers and prn nebs, start doxycycline   3. Mild renal insufficiency  - SCr is 1.13 on admission, close to priors  - Continue gentle IVF hydration overnight while NPO, renally-dose medications, avoid nephrotoxins, repeat chem panel in am    DVT prophylaxis: SCD's  Code Status: Full  Family Communication: Discussed with patient  Consults called: Orthopedic surgery Admission status: Inpatient     Vianne Bulls, MD [facility] Pager [phone]  If 7PM-7AM, please contact night-coverage [email or web address] Password [email or web address]  10/20/2017, 11:45 PM

## 2017-10-20 NOTE — ED Notes (Signed)
SpO2 88-90% on 2l/m [place] increased to 3 l/m

## 2017-10-20 NOTE — ED Notes (Signed)
Patient transported to CT 

## 2017-10-20 NOTE — ED Triage Notes (Signed)
Pt states she slipped/fell approx 1 hour PTA-pain to left hip-to triage in w/c-NAD-states she has walked since fall-pain with ambulation

## 2017-10-20 NOTE — ED Provider Notes (Signed)
[facility] EMERGENCY DEPARTMENT Provider Note   CSN: [id number] Arrival date & time: 10/20/17  1525     History   Chief Complaint Chief Complaint  Patient presents with  . Fall    HPI Renee Hawkins is a 77 y.o. female.  Pt presents to the ED today with fall and left sided hip pain.  The pt said she slipped and fell.  She has ambulated since the fall.  No other injuries.  No blood thinners.  Pt also said she's had a cough for the past few days.  She called her pcp on 7/12 and a rx for zithromax and prednisone 20 mg was called in to her pharmacy.  O2 sats down to mid 50s moving from wheelchair to bed.     Past Medical History:  Diagnosis Date  . Anxiety   . COPD (chronic obstructive pulmonary disease) ([place])   . Diverticulosis   . GERD (gastroesophageal reflux disease)   . Hx of adenomatous colonic polyps   . Hyperlipidemia   . Hypertension   . Hypomagnesemia   . Neuropathy   . Peripheral neuropathy   . Prediabetes   . RLS (restless legs syndrome)   . Sleep apnea    CPAP  . Vitamin B12 deficiency   . Vitamin D deficiency     Patient Active Problem List   Diagnosis Date Noted  . Acute on chronic respiratory failure with hypoxia ([place]) 10/20/2017  . Closed left hip fracture, initial encounter ([place]) 10/20/2017  . Pain of right humerus 10/12/2017  . Pain of left scapula 10/12/2017  . Elevated BP without diagnosis of hypertension 08/13/2017  . Hypokalemia 05/28/2016  . Follicular lymphoma grade iiia, lymph nodes of head, face, and neck ([place]) 02/11/2016  . Dyspnea 02/11/2016  . Encounter for antineoplastic chemotherapy 02/11/2016  . OSA and COPD overlap syndrome ([place]) 12/05/2014  . Osteoporosis 12/05/2014  . Medication management 03/06/2014  . CKD (chronic kidney disease) stage 2, GFR 60-89 ml/min 11/27/2013  . COPD (chronic obstructive pulmonary disease) with emphysema ([place]) 11/27/2013  . Hypertension   . Diverticulosis of colon with hemorrhage   .  GERD    . Hx of adenomatous colonic polyps   . Hyperlipidemia   . Prediabetes   . Vitamin D deficiency   . RLS (restless legs syndrome)   . Vitamin B12 deficiency   . Anxiety     Past Surgical History:  Procedure Laterality Date  . APPENDECTOMY    . COLONOSCOPY N/A 05/18/2014   Procedure: COLONOSCOPY;  Surgeon: Lafayette Dragon, MD;  Location: [facility];  Service: Endoscopy;  Laterality: N/A;  . DIRECT LARYNGOSCOPY Right 02/04/2016   Procedure: DIRECT LARYNGOSCOPY;  Surgeon: Rozetta Nunnery, MD;  Location: [facility];  Service: ENT;  Laterality: Right;  . EYE SURGERY  2017   Bilateral cataract Dr. Bing Plume  . LYMPH NODE BIOPSY Right 02/04/2016   Procedure: EXCISIONAL BIOPSY RIGHT NECK LYMPH NODE;  Surgeon: Rozetta Nunnery, MD;  Location: [facility];  Service: ENT;  Laterality: Right;  . TONSILLECTOMY       OB History   None      Home Medications    Prior to Admission medications   Medication Sig Start Date End Date Taking? Authorizing Provider  aspirin EC 81 MG tablet Take 81 mg by mouth daily.    [provider]  Cholecalciferol 4000 UNITS CAPS Take 1 capsule by mouth daily.    [provider]  gabapentin (NEURONTIN) 300 MG capsule TAKE 1 CAPSULE BY MOUTH 3 TO 4 TIMES DAILY FOR PAIN 05/26/17   Unk Pinto, MD  Magnesium 300 MG CAPS Take 1 capsule by mouth 3 (three) times daily.     [provider]  omeprazole (PRILOSEC) 40 MG capsule TAKE ONE CAPSULE BY MOUTH ONCE DAILY FOR  ACID  REFLUX 05/26/17   Unk Pinto, MD  predniSONE (DELTASONE) 20 MG tablet 1 tab 3 x day for 2 days, then 1 tab 2 x day for 2 days, then 1 tab 1 x day for 3 days 10/15/17   Unk Pinto, MD  rosuvastatin (CRESTOR) 40 MG tablet Take 1/2 to 1 tablet daily or as directed for Cholesterol 08/19/17   Unk Pinto, MD  Tiotropium Bromide-Olodaterol (STIOLTO RESPIMAT) 2.5-2.5 MCG/ACT AERS Inhale 2 Act into the lungs daily. 05/28/17    Vicie Mutters, PA-C  prochlorperazine (COMPAZINE) 10 MG tablet Take 1 tablet (10 mg total) by mouth every 6 (six) hours as needed (Nausea or vomiting). 02/25/16 05/28/16  Heath Lark, MD    Family History Family History  Problem Relation Age of Onset  . Stroke Mother   . Stroke Maternal Grandmother   . Colon cancer Neg Hx     Social History Social History   Tobacco Use  . Smoking status: Former Smoker    Packs/day: 1.00    Years: 30.00    Pack years: 30.00    Types: Cigarettes    Last attempt to quit: 04/06/2012    Years since quitting: 5.5  . Smokeless tobacco: Never Used  Substance Use Topics  . Alcohol use: No  . Drug use: No     Allergies   Patient has no known allergies.   Review of Systems Review of Systems  Respiratory: Positive for cough and shortness of breath.   Musculoskeletal:       Left hip pain  All other systems reviewed and are negative.    Physical Exam Updated Vital Signs BP (!) 148/75   Pulse 99   Temp 98.2 F (36.8 C) (Oral)   Resp 17   Ht 5\' 3"  (1.6 m)   Wt 50.8 kg (112 lb)   SpO2 (!) 88%   BMI 19.84 kg/m   Physical Exam  Constitutional: She is oriented to person, place, and time. She appears well-developed and well-nourished.  HENT:  Head: Normocephalic and atraumatic.  Right Ear: External ear normal.  Left Ear: External ear normal.  Nose: Nose normal.  Mouth/Throat: Oropharynx is clear and moist.  Eyes: Pupils are equal, round, and reactive to light. Conjunctivae and EOM are normal.  Neck: Normal range of motion. Neck supple.  Cardiovascular: Normal rate, regular rhythm, normal heart sounds and intact distal pulses.  Pulmonary/Chest: She has wheezes. She has rhonchi.  Abdominal: Soft. Bowel sounds are normal.  Musculoskeletal:       Left hip: She exhibits decreased range of motion and tenderness.  Neurological: She is alert and oriented to person, place, and time.  Skin: Skin is warm. Capillary refill takes less than 2  seconds.  Psychiatric: She has a normal mood and affect. Her behavior is normal. Judgment and thought content normal.  Nursing note and vitals reviewed.    ED Treatments / Results  Labs (all labs ordered are listed, but only abnormal results are displayed) Labs Reviewed  BASIC METABOLIC PANEL - Abnormal; Notable for the following components:      Result Value   Chloride 96 (*)    Glucose,  Bld 102 (*)    Creatinine, Ser 1.13 (*)    GFR calc non Af Amer 46 (*)    GFR calc Af Amer 54 (*)    All other components within normal limits  CBC WITH DIFFERENTIAL/PLATELET  BRAIN NATRIURETIC PEPTIDE  TROPONIN I  PROTIME-INR    EKG EKG Interpretation  Date/Time:  Wednesday October 20 2017 16:01:27 EDT Ventricular Rate:  106 PR Interval:    QRS Duration: 75 QT Interval:  341 QTC Calculation: 453 R Axis:   83 Text Interpretation:  Sinus tachycardia Borderline right axis deviation Minimal ST depression, inferior leads Since last tracing rate faster Confirmed by Isla Pence [phone]) on 10/20/2017 5:50:07 PM   Radiology Dg Chest 2 View  Result Date: 10/20/2017 CLINICAL DATA:  Shortness of breath today, history COPD, hypertension, former smoker, history of follicular lymphoma EXAM: CHEST - 2 VIEW COMPARISON:  11/10/2011 FINDINGS: Normal heart size, mediastinal contours, and pulmonary vascularity. Atherosclerotic calcification aorta. Emphysematous changes without pulmonary infiltrate, pleural effusion or pneumothorax. Bones demineralized. IMPRESSION: Emphysematous changes without acute abnormalities. Electronically Signed   By: Lavonia Dana M.D.   On: 10/20/2017 16:46   Ct Angio Chest Pe W And/or Wo Contrast  Result Date: 10/20/2017 CLINICAL DATA:  Cough and shortness of breath EXAM: CT ANGIOGRAPHY CHEST WITH CONTRAST TECHNIQUE: Multidetector CT imaging of the chest was performed using the standard protocol during bolus administration of intravenous contrast. Multiplanar CT image reconstructions  and MIPs were obtained to evaluate the vascular anatomy. CONTRAST:  147mL ISOVUE-370 IOPAMIDOL (ISOVUE-370) INJECTION 76% COMPARISON:  Chest CT 05/26/2012 FINDINGS: Cardiovascular: --Pulmonary arteries: Contrast injection is sufficient to demonstrate satisfactory opacification of the pulmonary arteries to the segmental level. There is no pulmonary embolus. The main pulmonary artery is within normal limits for size. --Aorta: Satisfactory opacification of the thoracic aorta. No aortic dissection or other acute aortic syndrome. Normal three-vessel pattern. Multifocal atherosclerosis of the proximal left vertebral artery. The aortic course and caliber are normal. There is moderate aortic atherosclerosis. --Heart: Normal size. No pericardial effusion. Mediastinum/Nodes: No mediastinal, hilar or axillary lymphadenopathy. Small sliding hiatal hernia. Lungs/Pleura: Unchanged 6 mm left lower lobe pulmonary nodule. Multiple peripheral nodules in the right middle lobe. Apical predominant emphysema. Multiple peripheral left lower lobe nodules are also noted, unchanged. Upper Abdomen: Contrast bolus timing is not optimized for evaluation of the abdominal organs. Within this limitation, the visualized organs of the upper abdomen are normal. Musculoskeletal: There is fracture of the inferior endplate of F81, age indeterminate. Review of the MIP images confirms the above findings. IMPRESSION: 1. No pulmonary embolus or acute aortic syndrome. 2. Aortic Atherosclerosis (ICD10-I70.0) and Emphysema (ICD10-J43.9). 3. T12 inferior endplate fracture, age indeterminate. Correlate for history of trauma. Thoracic spine MRI may be helpful for further temporal characterization. 4. Multiple bilateral pulmonary nodules, measuring up to 6 mm, unchanged. Electronically Signed   By: Ulyses Jarred M.D.   On: 10/20/2017 19:51   Dg Hip Unilat W Or Wo Pelvis 2-3 Views Left  Result Date: 10/20/2017 CLINICAL DATA:  Fall with left hip pain EXAM: DG  HIP (WITH OR WITHOUT PELVIS) 2-3V LEFT COMPARISON:  None. FINDINGS: There is an impacted fracture of the left femoral neck. Femoral head remains approximated in the acetabulum. IMPRESSION: Impacted fracture of the left femoral neck. Electronically Signed   By: Ulyses Jarred M.D.   On: 10/20/2017 16:36    Procedures Procedures (including critical care time)  Medications Ordered in ED Medications  ipratropium-albuterol (DUONEB) 0.5-2.5 (3) MG/3ML nebulizer solution  3 mL (3 mLs Nebulization Given 10/20/17 1556)  albuterol (PROVENTIL) (2.5 MG/3ML) 0.083% nebulizer solution 2.5 mg (2.5 mg Nebulization Given 10/20/17 1556)  methylPREDNISolone sodium succinate (SOLU-MEDROL) 125 mg/2 mL injection 125 mg (125 mg Intravenous Given 10/20/17 1559)  fentaNYL (SUBLIMAZE) injection 50 mcg (50 mcg Intravenous Given 10/20/17 1743)  ondansetron (ZOFRAN) injection 4 mg (4 mg Intravenous Given 10/20/17 1655)  iopamidol (ISOVUE-370) 76 % injection 100 mL (100 mLs Intravenous Contrast Given 10/20/17 1836)     Initial Impression / Assessment and Plan / ED Course  I have reviewed the triage vital signs and the nursing notes.  Pertinent labs & imaging results that were available during my care of the patient were reviewed by me and considered in my medical decision making (see chart for details).  CRITICAL CARE Performed by: Isla Pence   Total critical care time: 30 minutes  Critical care time was exclusive of separately billable procedures and treating other patients.  Critical care was necessary to treat or prevent imminent or life-threatening deterioration.  Critical care was time spent personally by me on the following activities: development of treatment plan with patient and/or surrogate as well as nursing, discussions with consultants, evaluation of patient's response to treatment, examination of patient, obtaining history from patient or surrogate, ordering and performing treatments and interventions,  ordering and review of laboratory studies, ordering and review of radiographic studies, pulse oximetry and re-evaluation of patient's condition.   Breathing is much improved after solumedrol and nebs.   Pt still requiring oxygen.  Pt d/w Dr. Marlou Sa (ortho) who requests admission to [facility].  Pt d/w Dr. Myna Hidalgo ([facility]) who will admit.    Final Clinical Impressions(s) / ED Diagnoses   Final diagnoses:  Closed fracture of neck of left femur, initial encounter ([place])  COPD exacerbation ([place])  Acute respiratory failure with hypoxia [facility])    ED Discharge Orders    None       Isla Pence, MD 10/20/17 2013

## 2017-10-20 NOTE — ED Notes (Signed)
Pt with congested cough and low sats-pt states she has had cough x 1 week and "that's being taken care of"-pt states she has COPD hx and that sats will get better when she is not moving-sats ranged from high 70s-low 80s-pt advised her low sats will flag as out of range and EDP will be addressing

## 2017-10-21 ENCOUNTER — Inpatient Hospital Stay (HOSPITAL_COMMUNITY): Payer: Medicare HMO | Admitting: Certified Registered"

## 2017-10-21 ENCOUNTER — Inpatient Hospital Stay (HOSPITAL_COMMUNITY): Payer: Medicare HMO

## 2017-10-21 ENCOUNTER — Encounter (HOSPITAL_COMMUNITY): Payer: Self-pay | Admitting: Family Medicine

## 2017-10-21 ENCOUNTER — Encounter (HOSPITAL_COMMUNITY)
Admission: EM | Disposition: A | Discharge status: Skilled Nursing Facility | Payer: Self-pay | Source: Other Acute Inpatient Hospital | Attending: Internal Medicine

## 2017-10-21 DIAGNOSIS — S72042A Displaced fracture of base of neck of left femur, initial encounter for closed fracture: Secondary | ICD-10-CM

## 2017-10-21 HISTORY — PX: TOTAL HIP ARTHROPLASTY: SHX124

## 2017-10-21 LAB — CBC
HCT: 30.9 % — ABNORMAL LOW (ref 36.0–46.0)
HEMOGLOBIN: 10.1 g/dL — AB (ref 12.0–15.0)
MCH: 31.6 pg (ref 26.0–34.0)
MCHC: 32.7 g/dL (ref 30.0–36.0)
MCV: 96.6 fL (ref 78.0–100.0)
Platelets: 141 10*3/uL — ABNORMAL LOW (ref 150–400)
RBC: 3.2 MIL/uL — AB (ref 3.87–5.11)
RDW: 13.2 % (ref 11.5–15.5)
WBC: 15 10*3/uL — ABNORMAL HIGH (ref 4.0–10.5)

## 2017-10-21 LAB — BASIC METABOLIC PANEL
Anion gap: 14 (ref 5–15)
BUN: 12 mg/dL (ref 8–23)
CHLORIDE: 101 mmol/L (ref 98–111)
CO2: 25 mmol/L (ref 22–32)
CREATININE: 1.18 mg/dL — AB (ref 0.44–1.00)
Calcium: 9.1 mg/dL (ref 8.9–10.3)
GFR calc Af Amer: 51 mL/min — ABNORMAL LOW (ref >60)
GFR calc non Af Amer: 44 mL/min — ABNORMAL LOW (ref >60)
Glucose, Bld: 238 mg/dL — ABNORMAL HIGH (ref 70–99)
Potassium: 3.7 mmol/L (ref 3.5–5.1)
SODIUM: 140 mmol/L (ref 135–145)

## 2017-10-21 LAB — TYPE AND SCREEN
ABO/RH(D): O POS
Antibody Screen: NEGATIVE

## 2017-10-21 LAB — SURGICAL PCR SCREEN
MRSA, PCR: NEGATIVE
Staphylococcus aureus: POSITIVE — AB

## 2017-10-21 LAB — ABO/RH: ABO/RH(D): O POS

## 2017-10-21 LAB — CREATININE, SERUM
Creatinine, Ser: 0.88 mg/dL (ref 0.44–1.00)
GFR calc Af Amer: >60 mL/min (ref >60)
GFR calc non Af Amer: >60 mL/min (ref >60)

## 2017-10-21 LAB — GLUCOSE, CAPILLARY: GLUCOSE-CAPILLARY: 121 mg/dL — AB (ref 70–99)

## 2017-10-21 SURGERY — ARTHROPLASTY, HIP, TOTAL, ANTERIOR APPROACH
Anesthesia: Spinal | Laterality: Left

## 2017-10-21 MED ORDER — ONDANSETRON HCL 4 MG/2ML IJ SOLN
INTRAMUSCULAR | Status: AC
Start: 2017-10-21 — End: ?
  Filled 2017-10-21: qty 2

## 2017-10-21 MED ORDER — ACETAMINOPHEN 500 MG PO TABS
500.0000 mg | ORAL_TABLET | Freq: Four times a day (QID) | ORAL | Status: AC
  Administered 2017-10-21 – 2017-10-22 (×3): 500 mg via ORAL
  Filled 2017-10-21 (×4): qty 1

## 2017-10-21 MED ORDER — DOCUSATE SODIUM 100 MG PO CAPS
100.0000 mg | ORAL_CAPSULE | Freq: Two times a day (BID) | ORAL | Status: DC
  Administered 2017-10-21 – 2017-10-26 (×10): 100 mg via ORAL
  Filled 2017-10-21 (×10): qty 1

## 2017-10-21 MED ORDER — VANCOMYCIN HCL 1000 MG IV SOLR
INTRAVENOUS | Status: DC | PRN
  Administered 2017-10-21: 1000 mg via TOPICAL

## 2017-10-21 MED ORDER — FENTANYL CITRATE (PF) 250 MCG/5ML IJ SOLN
INTRAMUSCULAR | Status: AC
  Filled 2017-10-21: qty 5

## 2017-10-21 MED ORDER — TRANEXAMIC ACID 1000 MG/10ML IV SOLN
1000.0000 mg | INTRAVENOUS | Status: AC
  Administered 2017-10-21: 1000 mg via INTRAVENOUS
  Filled 2017-10-21: qty 1100

## 2017-10-21 MED ORDER — ENOXAPARIN SODIUM 40 MG/0.4ML ~~LOC~~ SOLN: 40.0000 mg | Freq: Every day | SUBCUTANEOUS | 0 refills | Status: DC

## 2017-10-21 MED ORDER — PROPOFOL 500 MG/50ML IV EMUL
INTRAVENOUS | Status: DC | PRN
  Administered 2017-10-21: 50 ug/kg/min via INTRAVENOUS

## 2017-10-21 MED ORDER — CEFAZOLIN SODIUM-DEXTROSE 2-4 GM/100ML-% IV SOLN
2.0000 g | INTRAVENOUS | Status: AC
  Administered 2017-10-21: 2 g via INTRAVENOUS
  Filled 2017-10-21: qty 100

## 2017-10-21 MED ORDER — VANCOMYCIN HCL 1000 MG IV SOLR
INTRAVENOUS | Status: AC
  Filled 2017-10-21: qty 1000

## 2017-10-21 MED ORDER — MENTHOL 3 MG MT LOZG
1.0000 | LOZENGE | OROMUCOSAL | Status: DC | PRN
  Administered 2017-10-23: 3 mg via ORAL
  Filled 2017-10-21: qty 9

## 2017-10-21 MED ORDER — SODIUM CHLORIDE 0.9 % IR SOLN
Status: DC | PRN
  Administered 2017-10-21: 3000 mL

## 2017-10-21 MED ORDER — IPRATROPIUM-ALBUTEROL 0.5-2.5 (3) MG/3ML IN SOLN: 3.0000 mL | Freq: Four times a day (QID) | RESPIRATORY_TRACT | Status: DC | PRN

## 2017-10-21 MED ORDER — HYDROCODONE-ACETAMINOPHEN 5-325 MG PO TABS: 1.0000 | ORAL_TABLET | Freq: Four times a day (QID) | ORAL | 0 refills | Status: DC | PRN

## 2017-10-21 MED ORDER — ONDANSETRON HCL 4 MG PO TABS
4.0000 mg | ORAL_TABLET | Freq: Four times a day (QID) | ORAL | Status: DC | PRN
Start: 2017-10-21 — End: 2017-10-26

## 2017-10-21 MED ORDER — PREDNISONE 20 MG PO TABS
60.0000 mg | ORAL_TABLET | Freq: Every day | ORAL | Status: DC
  Filled 2017-10-21: qty 3

## 2017-10-21 MED ORDER — HYDROCODONE-ACETAMINOPHEN 7.5-325 MG PO TABS
1.0000 | ORAL_TABLET | ORAL | Status: DC | PRN
  Administered 2017-10-25 – 2017-10-26 (×3): 1 via ORAL
  Filled 2017-10-21 (×3): qty 1

## 2017-10-21 MED ORDER — POVIDONE-IODINE 10 % EX SWAB: 2.0000 "application " | Freq: Once | CUTANEOUS | Status: DC

## 2017-10-21 MED ORDER — SODIUM CHLORIDE 0.9 % IV SOLN
INTRAVENOUS | Status: DC
  Administered 2017-10-21: 21:00:00 via INTRAVENOUS

## 2017-10-21 MED ORDER — PROPOFOL 10 MG/ML IV BOLUS
INTRAVENOUS | Status: DC | PRN
  Administered 2017-10-21: 15 mg via INTRAVENOUS

## 2017-10-21 MED ORDER — IPRATROPIUM-ALBUTEROL 0.5-2.5 (3) MG/3ML IN SOLN: 3.0000 mL | Freq: Four times a day (QID) | RESPIRATORY_TRACT | Status: DC

## 2017-10-21 MED ORDER — ENOXAPARIN SODIUM 40 MG/0.4ML ~~LOC~~ SOLN
40.0000 mg | SUBCUTANEOUS | Status: DC
  Administered 2017-10-22 – 2017-10-25 (×4): 40 mg via SUBCUTANEOUS
  Filled 2017-10-21 (×4): qty 0.4

## 2017-10-21 MED ORDER — ONDANSETRON HCL 4 MG/2ML IJ SOLN: 4.0000 mg | Freq: Four times a day (QID) | INTRAMUSCULAR | Status: DC | PRN

## 2017-10-21 MED ORDER — FENTANYL CITRATE (PF) 250 MCG/5ML IJ SOLN
INTRAMUSCULAR | Status: DC | PRN
  Administered 2017-10-21: 50 ug via INTRAVENOUS

## 2017-10-21 MED ORDER — PHENOL 1.4 % MT LIQD: 1.0000 | OROMUCOSAL | Status: DC | PRN

## 2017-10-21 MED ORDER — TRANEXAMIC ACID 1000 MG/10ML IV SOLN
2000.0000 mg | INTRAVENOUS | Status: AC
  Administered 2017-10-21: 2000 mg via TOPICAL
  Filled 2017-10-21 (×2): qty 20

## 2017-10-21 MED ORDER — EPHEDRINE SULFATE 50 MG/ML IJ SOLN
INTRAMUSCULAR | Status: DC | PRN
  Administered 2017-10-21: 5 mg via INTRAVENOUS

## 2017-10-21 MED ORDER — PROPOFOL 1000 MG/100ML IV EMUL
INTRAVENOUS | Status: AC
  Filled 2017-10-21: qty 100

## 2017-10-21 MED ORDER — HYDROCODONE-ACETAMINOPHEN 5-325 MG PO TABS
1.0000 | ORAL_TABLET | ORAL | Status: DC | PRN
  Filled 2017-10-21: qty 1

## 2017-10-21 MED ORDER — BUDESONIDE 0.25 MG/2ML IN SUSP
0.2500 mg | Freq: Two times a day (BID) | RESPIRATORY_TRACT | Status: DC
  Administered 2017-10-22 – 2017-10-26 (×9): 0.25 mg via RESPIRATORY_TRACT
  Filled 2017-10-21 (×11): qty 2

## 2017-10-21 MED ORDER — MIDAZOLAM HCL 5 MG/5ML IJ SOLN
INTRAMUSCULAR | Status: DC | PRN
  Administered 2017-10-21: 1 mg via INTRAVENOUS

## 2017-10-21 MED ORDER — ACETAMINOPHEN 325 MG PO TABS
325.0000 mg | ORAL_TABLET | Freq: Four times a day (QID) | ORAL | Status: DC | PRN
  Administered 2017-10-22 – 2017-10-24 (×4): 650 mg via ORAL
  Filled 2017-10-21 (×4): qty 2

## 2017-10-21 MED ORDER — MORPHINE SULFATE (PF) 2 MG/ML IV SOLN
1.0000 mg | INTRAVENOUS | Status: DC | PRN
  Administered 2017-10-21 (×2): 1 mg via INTRAVENOUS
  Filled 2017-10-21 (×2): qty 1

## 2017-10-21 MED ORDER — POLYETHYLENE GLYCOL 3350 17 G PO PACK
17.0000 g | PACK | Freq: Every day | ORAL | Status: DC | PRN
  Administered 2017-10-23: 17 g via ORAL
  Filled 2017-10-21: qty 1

## 2017-10-21 MED ORDER — METHOCARBAMOL 500 MG PO TABS: 500.0000 mg | ORAL_TABLET | Freq: Four times a day (QID) | ORAL | Status: DC | PRN

## 2017-10-21 MED ORDER — CEFAZOLIN SODIUM-DEXTROSE 2-4 GM/100ML-% IV SOLN
2.0000 g | Freq: Four times a day (QID) | INTRAVENOUS | Status: AC
  Administered 2017-10-21 – 2017-10-22 (×2): 2 g via INTRAVENOUS
  Filled 2017-10-21 (×4): qty 100

## 2017-10-21 MED ORDER — ALUM & MAG HYDROXIDE-SIMETH 200-200-20 MG/5ML PO SUSP
30.0000 mL | ORAL | Status: DC | PRN
  Administered 2017-10-22 – 2017-10-23 (×3): 30 mL via ORAL
  Filled 2017-10-21 (×4): qty 30

## 2017-10-21 MED ORDER — MAGNESIUM CITRATE PO SOLN: 1.0000 | Freq: Once | ORAL | Status: DC | PRN

## 2017-10-21 MED ORDER — METHOCARBAMOL 1000 MG/10ML IJ SOLN
500.0000 mg | Freq: Four times a day (QID) | INTRAVENOUS | Status: DC | PRN
  Filled 2017-10-21: qty 5

## 2017-10-21 MED ORDER — ONDANSETRON HCL 4 MG/2ML IJ SOLN
INTRAMUSCULAR | Status: DC | PRN
  Administered 2017-10-21: 4 mg via INTRAVENOUS

## 2017-10-21 MED ORDER — SORBITOL 70 % SOLN: 30.0000 mL | Freq: Every day | Status: DC | PRN

## 2017-10-21 MED ORDER — LACTATED RINGERS IV SOLN
INTRAVENOUS | Status: DC
  Administered 2017-10-21 (×2): via INTRAVENOUS

## 2017-10-21 MED ORDER — MIDAZOLAM HCL 2 MG/2ML IJ SOLN
INTRAMUSCULAR | Status: AC
  Filled 2017-10-21: qty 2

## 2017-10-21 SURGICAL SUPPLY — 46 items
BAG DECANTER FOR FLEXI CONT (MISCELLANEOUS) ×3 IMPLANT
BLADE CLIPPER SURG (BLADE) ×3 IMPLANT
BLADE SAW SGTL 18X1.27X75 (BLADE) ×2 IMPLANT
BLADE SAW SGTL 18X1.27X75MM (BLADE) ×1
CABLE CERLAGE W/CRIMP 1.8 (Cable) ×2 IMPLANT
CABLE CERLAGE W/CRIMP 1.8MM (Cable) ×1 IMPLANT
CAPT HIP TOTAL 2 ×3 IMPLANT
CELLS DAT CNTRL 66122 CELL SVR (MISCELLANEOUS) ×1 IMPLANT
CLOSURE WOUND 1/2 X4 (GAUZE/BANDAGES/DRESSINGS) ×1
COVER PERINEAL POST (MISCELLANEOUS) ×3 IMPLANT
COVER SURGICAL LIGHT HANDLE (MISCELLANEOUS) ×3 IMPLANT
DRAPE C-ARM 42X72 X-RAY (DRAPES) ×3 IMPLANT
DRAPE STERI IOBAN 125X83 (DRAPES) ×3 IMPLANT
DRAPE U-SHAPE 47X51 STRL (DRAPES) ×9 IMPLANT
DRSG MEPILEX BORDER 4X8 (GAUZE/BANDAGES/DRESSINGS) ×3 IMPLANT
DURAPREP 26ML APPLICATOR (WOUND CARE) ×3 IMPLANT
ELECT BLADE 4.0 EZ CLEAN MEGAD (MISCELLANEOUS) ×3
ELECT REM PT RETURN 9FT ADLT (ELECTROSURGICAL) ×3
ELECTRODE BLDE 4.0 EZ CLN MEGD (MISCELLANEOUS) ×1 IMPLANT
ELECTRODE REM PT RTRN 9FT ADLT (ELECTROSURGICAL) ×1 IMPLANT
GLOVE BIOGEL PI IND STRL 8 (GLOVE) ×1 IMPLANT
GLOVE BIOGEL PI INDICATOR 8 (GLOVE) ×2
GLOVE SURG ORTHO 8.0 STRL STRW (GLOVE) ×6 IMPLANT
GOWN STRL REUS W/ TWL LRG LVL3 (GOWN DISPOSABLE) ×3 IMPLANT
GOWN STRL REUS W/TWL LRG LVL3 (GOWN DISPOSABLE) ×6
HANDPIECE INTERPULSE COAX TIP (DISPOSABLE) ×2
HOOD PEEL AWAY FLYTE STAYCOOL (MISCELLANEOUS) ×9 IMPLANT
KIT BASIN OR (CUSTOM PROCEDURE TRAY) ×3 IMPLANT
KIT TURNOVER KIT B (KITS) ×3 IMPLANT
NEEDLE HYPO 22GX1.5 SAFETY (NEEDLE) ×3 IMPLANT
NS IRRIG 1000ML POUR BTL (IV SOLUTION) ×3 IMPLANT
PACK TOTAL JOINT (CUSTOM PROCEDURE TRAY) ×3 IMPLANT
PAD ARMBOARD 7.5X6 YLW CONV (MISCELLANEOUS) ×3 IMPLANT
RTRCTR WOUND ALEXIS 18CM MED (MISCELLANEOUS) ×3
SET HNDPC FAN SPRY TIP SCT (DISPOSABLE) ×1 IMPLANT
STRIP CLOSURE SKIN 1/2X4 (GAUZE/BANDAGES/DRESSINGS) ×2 IMPLANT
SUT ETHIBOND NAB CT1 #1 30IN (SUTURE) ×3 IMPLANT
SUT VIC AB 0 CT1 27 (SUTURE) ×2
SUT VIC AB 0 CT1 27XBRD ANBCTR (SUTURE) ×1 IMPLANT
SUT VIC AB 1 CT1 27 (SUTURE) ×2
SUT VIC AB 1 CT1 27XBRD ANBCTR (SUTURE) ×1 IMPLANT
SUT VIC AB 2-0 CT1 27 (SUTURE) ×4
SUT VIC AB 2-0 CT1 TAPERPNT 27 (SUTURE) ×2 IMPLANT
SYR 30ML LL (SYRINGE) ×3 IMPLANT
TOWEL OR 17X24 6PK STRL BLUE (TOWEL DISPOSABLE) ×3 IMPLANT
TOWEL OR 17X26 10 PK STRL BLUE (TOWEL DISPOSABLE) ×3 IMPLANT

## 2017-10-21 NOTE — Progress Notes (Signed)
PROGRESS NOTE    Renee Hawkins  VZC:[id number] DOB: 09/24/1940 DOA: 10/20/2017 PCP: Unk Pinto, MD    Brief Narrative: Renee Hawkins is a 77 y.o. female with medical history significant for COPD, now presenting to the emergency department for evaluation of left hip pain after a ground-level mechanical fall.  She was also found to have sob, hypoxic and wheezing, with productive cough with clear sputum. Chest x-ray is consistent with emphysema, but no acute finding.  Radiographs of the pelvis are notable for impacted fracture involving the left femoral neck.  CTA chest is negative for PE or acute finding, notable for emphysema and age-indeterminate T12 inferior endplate fracture, and stable nodules bilaterally.    Assessment & Plan:   Principal Problem:   Closed left hip fracture, initial encounter ([employer]) Active Problems:   Acute respiratory failure with hypoxia (H[facility])   COPD with acute exacerbation ([facility])   Acute respiratory failure with hypoxia secondary to Acute COPD exacerbation:  Admitted for IV steroids, duo nebs and use [place] oxygen as needed to keep sats greater than 90%.  Resume Brovana and pulmicort.  Resume doxycycline for bronchitis.  Her sob and wheezing has improved today, will start tapering steroids to prednisone tomorrow.  Currently on 2 lit of [place] oxygen.    Closed left femoral neck fracture: Dr Marlou Sa consulted by EDP at [facility].  Transferred to [facility], for surgical repair.  Pain control.    Mild AKI:  Baseline creatinine is less than 1. Currently its 1.08.  Gently hydrate and repeat renal parameters in am.    Hypertension:  Well controlled.    Hyperglycemia:  Get A1c.  Ordered SSI.     GERD: stable.    Hyperlipidemia: resume crestor.     DVT prophylaxis: Lovenox.  Code Status: full code.  Family Communication: none at bedside.  Disposition Plan: pending clinical improvement and further management.    Consultants:   Orthopedics.     Procedures: None.   Antimicrobials:none.    Subjective: Leg pain and left hip pain better,  Breathing better.   Objective: Vitals:   10/21/17 0517 10/21/17 0823 10/21/17 0824 10/21/17 1000  BP: 132/65     Pulse: 87     Resp: 16     Temp: 98.3 F (36.8 C)     TempSrc: Oral     SpO2: 94% 93% 93% 90%  Weight:      Height:        Intake/Output Summary (Last 24 hours) at 10/21/2017 1105 Last data filed at 10/21/2017 0701 Gross per 24 hour  Intake 514.17 ml  Output 800 ml  Net -285.83 ml   Filed Weights   10/20/17 1537  Weight: 50.8 kg (112 lb)    Examination:  General exam: Appears calm and comfortable  Respiratory system: scattered wheezing posteriorly.  Cardiovascular system: S1 & S2 heard, RRR. No JVD, No pedal edema. Gastrointestinal system: Abdomen is nondistended, soft and nontender. No organomegaly or masses felt. Normal bowel sounds heard. Central nervous system: Alert and oriented. No focal neurological deficits. Extremities: no pedal edema.  Skin: No rashes, lesions or ulcers Psychiatry:  Mood & affect appropriate.     Data Reviewed: I have personally reviewed following labs and imaging studies  CBC: Recent Labs  Lab 10/20/17 1554  WBC 8.5  NEUTROABS 6.9  HGB 12.7  HCT 38.4  MCV 96.2  PLT 128   Basic Metabolic Panel: Recent Labs  Lab 10/20/17 1554 10/21/17 0033  NA 137 140  K  3.9 3.7  CL 96* 101  CO2 28 25  GLUCOSE 102* 238*  BUN 18 12  CREATININE 1.13* 1.18*  CALCIUM 9.4 9.1   GFR: Estimated Creatinine Clearance: 33 mL/min (A) (by C-G formula based on SCr of 1.18 mg/dL (H)). Liver Function Tests: No results for input(s): AST, ALT, ALKPHOS, BILITOT, PROT, ALBUMIN in the last 168 hours. No results for input(s): LIPASE, AMYLASE in the last 168 hours. No results for input(s): AMMONIA in the last 168 hours. Coagulation Profile: Recent Labs  Lab 10/20/17 1644  INR 0.92   Cardiac Enzymes: Recent Labs  Lab 10/20/17 1554   TROPONINI <0.03   BNP (last 3 results) No results for input(s): PROBNP in the last 8760 hours. HbA1C: No results for input(s): HGBA1C in the last 72 hours. CBG: No results for input(s): GLUCAP in the last 168 hours. Lipid Profile: No results for input(s): CHOL, HDL, LDLCALC, TRIG, CHOLHDL, LDLDIRECT in the last 72 hours. Thyroid Function Tests: No results for input(s): TSH, T4TOTAL, FREET4, T3FREE, THYROIDAB in the last 72 hours. Anemia Panel: No results for input(s): VITAMINB12, FOLATE, FERRITIN, TIBC, IRON, RETICCTPCT in the last 72 hours. Sepsis Labs: No results for input(s): PROCALCITON, LATICACIDVEN in the last 168 hours.  Recent Results (from the past 240 hour(s))  Surgical pcr screen     Status: Abnormal   Collection Time: 10/21/17  1:07 AM  Result Value Ref Range Status   MRSA, PCR NEGATIVE NEGATIVE Final   Staphylococcus aureus POSITIVE (A) NEGATIVE Final    Comment: (NOTE) The Xpert SA Assay (FDA approved for NASAL specimens in patients 62 years of age and older), is one component of a comprehensive surveillance program. It is not intended to diagnose infection nor to guide or monitor treatment. Performed at [facility], [place] [address], [place], [place] [address]          Radiology Studies: Dg Chest 2 View  Result Date: 10/20/2017 CLINICAL DATA:  Shortness of breath today, history COPD, hypertension, former smoker, history of follicular lymphoma EXAM: CHEST - 2 VIEW COMPARISON:  11/10/2011 FINDINGS: Normal heart size, mediastinal contours, and pulmonary vascularity. Atherosclerotic calcification aorta. Emphysematous changes without pulmonary infiltrate, pleural effusion or pneumothorax. Bones demineralized. IMPRESSION: Emphysematous changes without acute abnormalities. Electronically Signed   By: Lavonia Dana M.D.   On: 10/20/2017 16:46   Ct Angio Chest Pe W And/or Wo Contrast  Result Date: 10/20/2017 CLINICAL DATA:  Cough and shortness of breath EXAM: CT  ANGIOGRAPHY CHEST WITH CONTRAST TECHNIQUE: Multidetector CT imaging of the chest was performed using the standard protocol during bolus administration of intravenous contrast. Multiplanar CT image reconstructions and MIPs were obtained to evaluate the vascular anatomy. CONTRAST:  161mL ISOVUE-370 IOPAMIDOL (ISOVUE-370) INJECTION 76% COMPARISON:  Chest CT 05/26/2012 FINDINGS: Cardiovascular: --Pulmonary arteries: Contrast injection is sufficient to demonstrate satisfactory opacification of the pulmonary arteries to the segmental level. There is no pulmonary embolus. The main pulmonary artery is within normal limits for size. --Aorta: Satisfactory opacification of the thoracic aorta. No aortic dissection or other acute aortic syndrome. Normal three-vessel pattern. Multifocal atherosclerosis of the proximal left vertebral artery. The aortic course and caliber are normal. There is moderate aortic atherosclerosis. --Heart: Normal size. No pericardial effusion. Mediastinum/Nodes: No mediastinal, hilar or axillary lymphadenopathy. Small sliding hiatal hernia. Lungs/Pleura: Unchanged 6 mm left lower lobe pulmonary nodule. Multiple peripheral nodules in the right middle lobe. Apical predominant emphysema. Multiple peripheral left lower lobe nodules are also noted, unchanged. Upper Abdomen: Contrast bolus timing is  not optimized for evaluation of the abdominal organs. Within this limitation, the visualized organs of the upper abdomen are normal. Musculoskeletal: There is fracture of the inferior endplate of W10, age indeterminate. Review of the MIP images confirms the above findings. IMPRESSION: 1. No pulmonary embolus or acute aortic syndrome. 2. Aortic Atherosclerosis (ICD10-I70.0) and Emphysema (ICD10-J43.9). 3. T12 inferior endplate fracture, age indeterminate. Correlate for history of trauma. Thoracic spine MRI may be helpful for further temporal characterization. 4. Multiple bilateral pulmonary nodules, measuring up to  6 mm, unchanged. Electronically Signed   By: Ulyses Jarred M.D.   On: 10/20/2017 19:51   Dg Hip Unilat W Or Wo Pelvis 2-3 Views Left  Result Date: 10/20/2017 CLINICAL DATA:  Fall with left hip pain EXAM: DG HIP (WITH OR WITHOUT PELVIS) 2-3V LEFT COMPARISON:  None. FINDINGS: There is an impacted fracture of the left femoral neck. Femoral head remains approximated in the acetabulum. IMPRESSION: Impacted fracture of the left femoral neck. Electronically Signed   By: Ulyses Jarred M.D.   On: 10/20/2017 16:36        Scheduled Meds: . arformoterol  15 mcg Nebulization BID  . cholecalciferol  4,000 Units Oral Daily  . doxycycline  100 mg Oral Q12H  . magnesium oxide  400 mg Oral BID  . methylPREDNISolone (SOLU-MEDROL) injection  60 mg Intravenous Q6H  . pantoprazole  40 mg Oral Daily  . rosuvastatin  20 mg Oral q1800  . umeclidinium bromide  1 puff Inhalation Daily   Continuous Infusions: . sodium chloride 90 mL/hr at 10/21/17 0109     LOS: 1 day    Time spent: 35 minutes.     Hosie Poisson, MD [facility] Pager [phone]  If 7PM-7AM, please contact night-coverage [email or web address] Password [email or web address] 10/21/2017, 11:05 AM

## 2017-10-21 NOTE — Op Note (Signed)
TOTAL HIP ARTHROPLASTY ANTERIOR APPROACH  Procedure Note Renee Hawkins   [id number]  Pre-op Diagnosis: left hip fracture     Post-op Diagnosis: same   Operative Procedures  1. Total hip replacement; Left hip; uncemented [id number]   Personnel  Surgeon(s): Leandrew Koyanagi, MD  Assist: Madalyn Rob, PA-C; necessary for the timely completion of procedure and due to complexity of procedure.   Anesthesia: spinal  Prosthesis: Depuy Acetabulum: Pinnacle 54 mm Femur: Corail KA 12 Head: 36 mm size: +1.5 Liner: +0 neutral Bearing Type: metal on poly  Total Hip Arthroplasty (Anterior Approach) Op Note:  After informed consent was obtained and the operative extremity marked in the holding area, the patient was brought back to the operating room and placed supine on the HANA table. Next, the operative extremity was prepped and draped in normal sterile fashion. Surgical timeout occurred verifying patient identification, surgical site, surgical procedure and administration of antibiotics.  A modified anterior Smith-Peterson approach to the hip was performed, using the interval between tensor fascia lata and sartorius.  Dissection was carried bluntly down onto the anterior hip capsule. The lateral femoral circumflex vessels were identified and coagulated. A capsulotomy was performed and the capsular flaps tagged for later repair.  Fluoroscopy was utilized to prepare for the femoral neck cut. The neck osteotomy was performed. The femoral head was removed, the acetabular rim was cleared of soft tissue and attention was turned to reaming the acetabulum.  Sequential reaming was performed under fluoroscopic guidance. We reamed to a size 53 mm, and then impacted the acetabular shell. The liner was then placed after irrigation and attention turned to the femur.  After placing the femoral hook, the leg was taken to externally rotated, extended and adducted position taking care to perform soft tissue  releases to allow for adequate mobilization of the femur. Soft tissue was cleared from the shoulder of the greater trochanter and the hook elevator used to improve exposure of the proximal femur. Sequential broaching performed up to a size 12. During the final broach, a small fracture in the calcar was recognized that extended to the lesser trochanter but not past it.  A single cable was placed and tensioned to treat the calcar fracture.  Trial neck and head were placed. The leg was brought back up to neutral and the construct reduced. The position and sizing of components, offset and leg lengths were checked using fluoroscopy. Stability of the construct was checked in extension and external rotation without any subluxation or impingement of prosthesis. We dislocated the prosthesis, dropped the leg back into position, removed trial components, and irrigated copiously. The final stem and head was then placed, the leg brought back up, the system reduced and fluoroscopy used to verify positioning.  The final stem was stable without any evidence of fracture propagation. We irrigated, obtained hemostasis and closed the capsule using #2 ethibond suture.  One gram of vancomycin powder was placed in the surgical bed. The fascia was closed with #1 vicryl plus, the deep fat layer was closed with 0 vicryl, the subcutaneous layers closed with 2.0 Vicryl Plus and the skin closed with 2.0 nylon and steri strips. A sterile dressing was applied. The patient was awakened in the operating room and taken to recovery in stable condition.  All sponge, needle, and instrument counts were correct at the end of the case.   Position: supine  Complications: none.  Time Out: performed   Drains/Packing: none  Estimated blood loss: 150 cc  Returned to Recovery Room: in good condition.   Antibiotics: yes   Mechanical VTE (DVT) Prophylaxis: sequential compression devices, TED thigh-high  Chemical VTE (DVT) Prophylaxis: lovenox    Fluid Replacement: see anesthesia record  Specimens Removed: 1 to pathology   Sponge and Instrument Count Correct? yes   PACU: portable radiograph - low AP   Admission: inpatient status  Plan/RTC: Return in 2 weeks for staple removal. Weight Bearing/Load Lower Extremity: full  Hip precautions: none Suture Removal: 10-14 days  Betadine to incision twice daily once dressing is removed on POD#7  N. Eduard Roux, MD [place] 4:39 PM     Implant Name Type Inv. Item Serial No. Manufacturer Lot No. LRB No. Used  ACETABULAR CUP Daisy Blossom 54MM - [id number] Plate ACETABULAR CUP W GRIPTION 54MM  DEPUY SYNTHES [id number] Left 1  SCREW 6.5MMX25MM - [id number] Screw SCREW 6.5MMX25MM  DEPUY SYNTHES [id number] Left 1  CABLE CERLAGE W/CRIMP 1.8MM - [id number] Cable CABLE CERLAGE W/CRIMP 1.8MM  ZIMMER RECON(ORTH,TRAU,BIO,SG) [id number] Left 1  LINER NEUTRAL 36ID 54OD - [id number] Liner LINER NEUTRAL 36ID 54OD  DEPUY SYNTHES [id number] Left 1  STEM CORAIL KA12 - [id number] Stem STEM CORAIL KA12  DEPUY SYNTHES [id number] Left 1

## 2017-10-21 NOTE — Consult Note (Signed)
ORTHOPAEDIC CONSULTATION  REQUESTING PHYSICIAN: Hosie Poisson, MD  Chief Complaint: Left femoral neck hip fracture  HPI: Renee Hawkins is a 77 y.o. female who presents with left hip fracture s/p mechanical fall last night at home.  The patient endorses severe pain in the left hip, that does not radiate, grinding in quality, worse with any movement, better with immobilization.  Denies LOC/fever/chills/nausea/vomiting.  Walks without assistive devices (walker, cane, wheelchair).  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  Denies LOC, neck pain, abd pain.  Past Medical History:  Diagnosis Date  . Anxiety   . COPD (chronic obstructive pulmonary disease) ([place])   . Diverticulosis   . GERD (gastroesophageal reflux disease)   . Hx of adenomatous colonic polyps   . Hyperlipidemia   . Hypertension   . Hypomagnesemia   . Neuropathy   . Peripheral neuropathy   . Prediabetes   . RLS (restless legs syndrome)   . Sleep apnea    CPAP  . Vitamin B12 deficiency   . Vitamin D deficiency    Past Surgical History:  Procedure Laterality Date  . APPENDECTOMY    . COLONOSCOPY N/A 05/18/2014   Procedure: COLONOSCOPY;  Surgeon: Lafayette Dragon, MD;  Location: [facility];  Service: Endoscopy;  Laterality: N/A;  . DIRECT LARYNGOSCOPY Right 02/04/2016   Procedure: DIRECT LARYNGOSCOPY;  Surgeon: Rozetta Nunnery, MD;  Location: [facility];  Service: ENT;  Laterality: Right;  . EYE SURGERY  2017   Bilateral cataract Dr. Bing Plume  . LYMPH NODE BIOPSY Right 02/04/2016   Procedure: EXCISIONAL BIOPSY RIGHT NECK LYMPH NODE;  Surgeon: Rozetta Nunnery, MD;  Location: [facility];  Service: ENT;  Laterality: Right;  . TONSILLECTOMY     Social History   Socioeconomic History  . Marital status: Widowed    Spouse name: Not on file  . Number of children: 0  . Years of education: Not on file  . Highest education level: Not on file  Occupational History  .  Occupation: Retired     Fish farm manager: RETIRED  Social Needs  . Financial resource strain: Not on file  . Food insecurity:    Worry: Not on file    Inability: Not on file  . Transportation needs:    Medical: Not on file    Non-medical: Not on file  Tobacco Use  . Smoking status: Former Smoker    Packs/day: 1.00    Years: 30.00    Pack years: 30.00    Types: Cigarettes    Last attempt to quit: 04/06/2012    Years since quitting: 5.5  . Smokeless tobacco: Never Used  Substance and Sexual Activity  . Alcohol use: No  . Drug use: No  . Sexual activity: Not Currently  Lifestyle  . Physical activity:    Days per week: Not on file    Minutes per session: Not on file  . Stress: Not on file  Relationships  . Social connections:    Talks on phone: Not on file    Gets together: Not on file    Attends religious service: Not on file    Active member of club or organization: Not on file    Attends meetings of clubs or organizations: Not on file    Relationship status: Not on file  Other Topics Concern  . Not on file  Social History Narrative   Daily caffeine    Family History  Problem Relation Age of Onset  .  Stroke Mother   . Stroke Maternal Grandmother   . Colon cancer Neg Hx    No Known Allergies Prior to Admission medications   Medication Sig Start Date End Date Taking? Authorizing Provider  aspirin EC 81 MG tablet Take 81 mg by mouth daily.   Yes [provider]  Cholecalciferol 4000 UNITS CAPS Take 1 capsule by mouth 2 (two) times daily.    Yes [provider]  Coenzyme Q10 10 MG capsule Take 10 mg by mouth 2 (two) times daily.   Yes [provider]  gabapentin (NEURONTIN) 300 MG capsule TAKE 1 CAPSULE BY MOUTH 3 TO 4 TIMES DAILY FOR PAIN Patient taking differently: Take 600 mg by mouth 2 (two) times daily.  05/26/17  Yes Unk Pinto, MD  Magnesium 250 MG TABS Take 250 mg by mouth daily.    Yes [provider]  potassium chloride  (K-DUR,KLOR-CON) 10 MEQ tablet Take 10-20 mEq by mouth See admin instructions. 12mEq in the AM and 6mEq in the PM   Yes [provider]  Tiotropium Bromide-Olodaterol (STIOLTO RESPIMAT) 2.5-2.5 MCG/ACT AERS Inhale 2 Act into the lungs daily. 05/28/17  Yes Vicie Mutters, PA-C  Vinpocetine POWD Take 10 mcg by mouth daily. Capsule form   Yes [provider]  vitamin C (ASCORBIC ACID) 500 MG tablet Take 500 mg by mouth daily.   Yes [provider]  omeprazole (PRILOSEC) 40 MG capsule TAKE ONE CAPSULE BY MOUTH ONCE DAILY FOR  ACID  REFLUX Patient not taking: Reported on 10/21/2017 05/26/17   Unk Pinto, MD  predniSONE (DELTASONE) 20 MG tablet 1 tab 3 x day for 2 days, then 1 tab 2 x day for 2 days, then 1 tab 1 x day for 3 days Patient not taking: Reported on 10/21/2017 10/15/17   Unk Pinto, MD  rosuvastatin (CRESTOR) 40 MG tablet Take 1/2 to 1 tablet daily or as directed for Cholesterol Patient not taking: Reported on 10/21/2017 08/19/17   Unk Pinto, MD  prochlorperazine (COMPAZINE) 10 MG tablet Take 1 tablet (10 mg total) by mouth every 6 (six) hours as needed (Nausea or vomiting). 02/25/16 05/28/16  Heath Lark, MD   Dg Chest 2 View  Result Date: 10/20/2017 CLINICAL DATA:  Shortness of breath today, history COPD, hypertension, former smoker, history of follicular lymphoma EXAM: CHEST - 2 VIEW COMPARISON:  11/10/2011 FINDINGS: Normal heart size, mediastinal contours, and pulmonary vascularity. Atherosclerotic calcification aorta. Emphysematous changes without pulmonary infiltrate, pleural effusion or pneumothorax. Bones demineralized. IMPRESSION: Emphysematous changes without acute abnormalities. Electronically Signed   By: Lavonia Dana M.D.   On: 10/20/2017 16:46   Ct Angio Chest Pe W And/or Wo Contrast  Result Date: 10/20/2017 CLINICAL DATA:  Cough and shortness of breath EXAM: CT ANGIOGRAPHY CHEST WITH CONTRAST TECHNIQUE: Multidetector CT imaging of the  chest was performed using the standard protocol during bolus administration of intravenous contrast. Multiplanar CT image reconstructions and MIPs were obtained to evaluate the vascular anatomy. CONTRAST:  156mL ISOVUE-370 IOPAMIDOL (ISOVUE-370) INJECTION 76% COMPARISON:  Chest CT 05/26/2012 FINDINGS: Cardiovascular: --Pulmonary arteries: Contrast injection is sufficient to demonstrate satisfactory opacification of the pulmonary arteries to the segmental level. There is no pulmonary embolus. The main pulmonary artery is within normal limits for size. --Aorta: Satisfactory opacification of the thoracic aorta. No aortic dissection or other acute aortic syndrome. Normal three-vessel pattern. Multifocal atherosclerosis of the proximal left vertebral artery. The aortic course and caliber are normal. There is moderate aortic atherosclerosis. --Heart: Normal size. No  pericardial effusion. Mediastinum/Nodes: No mediastinal, hilar or axillary lymphadenopathy. Small sliding hiatal hernia. Lungs/Pleura: Unchanged 6 mm left lower lobe pulmonary nodule. Multiple peripheral nodules in the right middle lobe. Apical predominant emphysema. Multiple peripheral left lower lobe nodules are also noted, unchanged. Upper Abdomen: Contrast bolus timing is not optimized for evaluation of the abdominal organs. Within this limitation, the visualized organs of the upper abdomen are normal. Musculoskeletal: There is fracture of the inferior endplate of Z61, age indeterminate. Review of the MIP images confirms the above findings. IMPRESSION: 1. No pulmonary embolus or acute aortic syndrome. 2. Aortic Atherosclerosis (ICD10-I70.0) and Emphysema (ICD10-J43.9). 3. T12 inferior endplate fracture, age indeterminate. Correlate for history of trauma. Thoracic spine MRI may be helpful for further temporal characterization. 4. Multiple bilateral pulmonary nodules, measuring up to 6 mm, unchanged. Electronically Signed   By: Ulyses Jarred M.D.   On:  10/20/2017 19:51   Dg Hip Unilat W Or Wo Pelvis 2-3 Views Left  Result Date: 10/20/2017 CLINICAL DATA:  Fall with left hip pain EXAM: DG HIP (WITH OR WITHOUT PELVIS) 2-3V LEFT COMPARISON:  None. FINDINGS: There is an impacted fracture of the left femoral neck. Femoral head remains approximated in the acetabulum. IMPRESSION: Impacted fracture of the left femoral neck. Electronically Signed   By: Ulyses Jarred M.D.   On: 10/20/2017 16:36    All pertinent xrays, MRI, CT independently reviewed and interpreted  Positive ROS: All other systems have been reviewed and were otherwise negative with the exception of those mentioned in the HPI and as above.  Physical Exam: General: Alert, no acute distress Cardiovascular: No pedal edema Respiratory: No cyanosis, no use of accessory musculature GI: No organomegaly, abdomen is soft and non-tender Skin: No lesions in the area of chief complaint Neurologic: Sensation intact distally Psychiatric: Patient is competent for consent with normal mood and affect Lymphatic: No axillary or cervical lymphadenopathy  MUSCULOSKELETAL:  - pain with movement of the hip and extremity - skin intact - NVI distally - compartments soft  Assessment: Left femoral neck hip fracture  Plan: - total hip replacement is recommended, patient and family are aware of r/b/a and wish to proceed - consent obtained - medical optimization per primary team - surgery is planned for today  Thank you for the consult and the opportunity to see Ms. Donisha Hoch. Eduard Roux, MD [place] 3:10 PM

## 2017-10-21 NOTE — Plan of Care (Signed)
  Problem: Education: Goal: Knowledge of General Education information will improve Description Including pain rating scale, medication(s)/side effects and non-pharmacologic comfort measures Outcome: Progressing Note:  POC and orders reviewed with pt./ family.

## 2017-10-21 NOTE — Anesthesia Postprocedure Evaluation (Signed)
Anesthesia Post Note  Patient: Renee Hawkins  Procedure(s) Performed: TOTAL HIP ARTHROPLASTY ANTERIOR APPROACH (Left )     Patient location during evaluation: PACU Anesthesia Type: Spinal Level of consciousness: oriented and awake and alert Pain management: pain level controlled Vital Signs Assessment: post-procedure vital signs reviewed and stable Respiratory status: spontaneous breathing, respiratory function stable and patient connected to nasal cannula oxygen Cardiovascular status: blood pressure returned to baseline and stable Postop Assessment: no headache, no backache and no apparent nausea or vomiting Anesthetic complications: no    Last Vitals:  Vitals:   10/21/17 1705 10/21/17 1720  BP: (!) 114/97 129/73  Pulse: 88 81  Resp: 10 18  Temp: (!) 36.3 C   SpO2: (!) 84% (!) 86%    Last Pain:  Vitals:   10/21/17 1705  TempSrc:   PainSc: 0-No pain                 Anesha Hackert DAVID

## 2017-10-21 NOTE — Anesthesia Preprocedure Evaluation (Signed)
Anesthesia Evaluation  Patient identified by MRN, date of birth, ID band Patient awake    Reviewed: Allergy & Precautions, NPO status , Patient's Chart, lab work & pertinent test results  Airway Mallampati: I  TM Distance: >3 FB Neck ROM: Full    Dental   Pulmonary sleep apnea , COPD, former smoker,    Pulmonary exam normal        Cardiovascular hypertension, Pt. on medications Normal cardiovascular exam     Neuro/Psych Anxiety    GI/Hepatic   Endo/Other    Renal/GU      Musculoskeletal   Abdominal   Peds  Hematology   Anesthesia Other Findings   Reproductive/Obstetrics                             Anesthesia Physical Anesthesia Plan  ASA: II  Anesthesia Plan: Spinal   Post-op Pain Management:    Induction: Intravenous  PONV Risk Score and Plan: 2  Airway Management Planned: Simple Face Mask  Additional Equipment:   Intra-op Plan:   Post-operative Plan:   Informed Consent: I have reviewed the patients History and Physical, chart, labs and discussed the procedure including the risks, benefits and alternatives for the proposed anesthesia with the patient or authorized representative who has indicated his/her understanding and acceptance.     Plan Discussed with: CRNA and Surgeon  Anesthesia Plan Comments:         Anesthesia Quick Evaluation

## 2017-10-21 NOTE — Discharge Instructions (Signed)
° ° °  1. Change dressings as needed °2. May shower but keep incisions covered and dry °3. Take lovenox to prevent blood clots °4. Take stool softeners as needed °5. Take pain meds as needed ° °

## 2017-10-21 NOTE — Progress Notes (Signed)
Pt. transferred via [place] from [place] to [facility] 5N-12; alert and oriented x4; transferred from stretcher to bed; unable to get sheets from underneath pt. and check skin on posterior side( the back and buttocks) pt. states this would cause pain in (L) hip and refuse to turn. Pt. trying to void- purewick in place after situated in bed; pt. unable to void and Irene,NT bladder scanned pt. with > than 700cc urine;  In/out cath to be be done- will leave foley in since pt. for surgery and will let MD know; Dr. Myna Hidalgo informed later. Sister and friend with pt.- and informed all that pt.'s surgery schedule around 3:15pm 10/21/17; and that pt.'s NPO after mn. Pt. oriented to call button and room.

## 2017-10-21 NOTE — Progress Notes (Signed)
Pt refusing to turn over so RN can look at backside, pt states "it'll hurt and I don't want to move my leg." Will reassess after surgery.

## 2017-10-21 NOTE — Transfer of Care (Signed)
Immediate Anesthesia Transfer of Care Note  Patient: Renee Hawkins  Procedure(s) Performed: TOTAL HIP ARTHROPLASTY ANTERIOR APPROACH (Left )  Patient Location: PACU  Anesthesia Type:Spinal  Level of Consciousness: awake, alert  and oriented  Airway & Oxygen Therapy: Patient Spontanous Breathing and Patient connected to face mask oxygen  Post-op Assessment: Report given to RN and Post -op Vital signs reviewed and stable  Post vital signs: Reviewed and stable  Last Vitals:  Vitals Value Taken Time  BP 114/97 10/21/2017  5:05 PM  Temp    Pulse 86 10/21/2017  5:05 PM  Resp 8 10/21/2017  5:05 PM  SpO2 95 % 10/21/2017  5:05 PM  Vitals shown include unvalidated device data.  Last Pain:  Vitals:   10/21/17 1353  TempSrc:   PainSc: 0-No pain         Complications: No apparent anesthesia complications

## 2017-10-21 NOTE — Progress Notes (Signed)
Inpatient Diabetes Program Recommendations  AACE/ADA: New Consensus Statement on Inpatient Glycemic Control (2015)  Target Ranges:  Prepandial:   less than 140 mg/dL      Peak postprandial:   less than 180 mg/dL (1-2 hours)      Critically ill patients:  140 - 180 mg/dL   Lab Results  Component Value Date   GLUCAP 95 05/27/2016   HGBA1C 5.4 08/18/2017    Review of Glycemic Control Results for Renee Hawkins, Renee Hawkins (MRN [id number]) as of 10/21/2017 11:45  Ref. Range 10/21/2017 00:33  Glucose Latest Ref Range: 70 - 99 mg/dL 238 (H)   Diabetes history: none Outpatient Diabetes medications: none Current orders for Inpatient glycemic control: none   Inpatient Diabetes Program Recommendations:    Noted administration of Solumedrol 60 mg Q6H with an increase to serum glucose. Consider ordering Novolog 0-9 units TID and obtaining CBGs TID + HS under the Glycemic control order set.   Thanks, Bronson Curb, MSN, RNC-OB Diabetes Coordinator [phone] (8a-5p)

## 2017-10-22 ENCOUNTER — Encounter (HOSPITAL_COMMUNITY): Payer: Self-pay | Admitting: Orthopaedic Surgery

## 2017-10-22 LAB — BASIC METABOLIC PANEL
Anion gap: 5 (ref 5–15)
BUN: 14 mg/dL (ref 8–23)
CALCIUM: 7.9 mg/dL — AB (ref 8.9–10.3)
CO2: 27 mmol/L (ref 22–32)
CREATININE: 0.83 mg/dL (ref 0.44–1.00)
Chloride: 109 mmol/L (ref 98–111)
GFR calc Af Amer: >60 mL/min (ref >60)
GFR calc non Af Amer: >60 mL/min (ref >60)
GLUCOSE: 126 mg/dL — AB (ref 70–99)
Potassium: 4.2 mmol/L (ref 3.5–5.1)
Sodium: 141 mmol/L (ref 135–145)

## 2017-10-22 LAB — CBC
HEMATOCRIT: 29.3 % — AB (ref 36.0–46.0)
Hemoglobin: 9.3 g/dL — ABNORMAL LOW (ref 12.0–15.0)
MCH: 31 pg (ref 26.0–34.0)
MCHC: 31.7 g/dL (ref 30.0–36.0)
MCV: 97.7 fL (ref 78.0–100.0)
Platelets: 132 10*3/uL — ABNORMAL LOW (ref 150–400)
RBC: 3 MIL/uL — ABNORMAL LOW (ref 3.87–5.11)
RDW: 13.2 % (ref 11.5–15.5)
WBC: 9.7 10*3/uL (ref 4.0–10.5)

## 2017-10-22 MED ORDER — ENSURE ENLIVE PO LIQD
237.0000 mL | Freq: Two times a day (BID) | ORAL | Status: DC
  Administered 2017-10-22 – 2017-10-26 (×7): 237 mL via ORAL

## 2017-10-22 MED ORDER — PREDNISONE 20 MG PO TABS
40.0000 mg | ORAL_TABLET | Freq: Every day | ORAL | Status: DC
  Administered 2017-10-22 – 2017-10-24 (×3): 40 mg via ORAL
  Filled 2017-10-22 (×2): qty 2

## 2017-10-22 NOTE — Progress Notes (Signed)
Physical Therapy Treatment Patient Details Name: Renee Hawkins MRN: [id number] DOB: 1940/12/17 Today's Date: 10/22/2017    History of Present Illness Pt is a 77 y/o female s/p L THA secondary to sustaining a mechanical fall at home in which she fractured her L femoral neck. PMH including but not limited to COPD    PT Comments    Pt making steady progress with functional mobility but remains limited secondary to pain, weakness and fatigue. Continue to feel pt would benefit from [place] rehab at a SNF prior to returning home. Pt would continue to benefit from skilled physical therapy services at this time while admitted and after d/c to address the below listed limitations in order to improve overall safety and independence with functional mobility.    Follow Up Recommendations  SNF     Equipment Recommendations  None recommended by PT    Recommendations for Other Services       Precautions / Restrictions Precautions Precautions: Fall Restrictions Weight Bearing Restrictions: Yes LLE Weight Bearing: Weight bearing as tolerated    Mobility  Bed Mobility Overal bed mobility: Needs Assistance Bed Mobility: Supine to Sit;Sit to Supine     Supine to sit: Min assist Sit to supine: Min assist   General bed mobility comments: increased time and effort, assist with L LE movement on and off of bed  Transfers Overall transfer level: Needs assistance Equipment used: Rolling walker (2 wheeled) Transfers: Sit to/from Stand Sit to Stand: Min guard         General transfer comment: increased time and effort, min guard for safety  Ambulation/Gait Ambulation/Gait assistance: Min guard Gait Distance (Feet): 40 Feet Assistive device: Rolling walker (2 wheeled) Gait Pattern/deviations: Decreased step length - right;Decreased step length - left;Decreased stride length;Step-to pattern;Step-through pattern;Decreased stance time - left;Decreased weight shift to left;Antalgic Gait  velocity: decreased Gait velocity interpretation: <1.31 ft/sec, indicative of household ambulator General Gait Details: pt steady with RW, limited secondary to pain and bilateral UE fatigue. Pt continuing to demonstrate a step-to pattern and moderately antalgic gait   Stairs             Wheelchair Mobility    Modified Rankin (Stroke Patients Only)       Balance Overall balance assessment: Needs assistance;History of Falls Sitting-balance support: Feet supported Sitting balance-Leahy Scale: Good     Standing balance support: During functional activity;Bilateral upper extremity supported Standing balance-Leahy Scale: Poor                              Cognition Arousal/Alertness: Awake/alert Behavior During Therapy: WFL for tasks assessed/performed Overall Cognitive Status: Within Functional Limits for tasks assessed                                        Exercises Total Joint Exercises Gluteal Sets: AROM;Strengthening;Both;10 reps;Supine Long Arc Quad: AROM;Strengthening;Left;10 reps;Seated Marching in Standing: Seated;AROM;Strengthening;Left;10 reps    General Comments        Pertinent Vitals/Pain Pain Assessment: Faces Pain Score: 2  Faces Pain Scale: Hurts a little bit Pain Location: L hip Pain Descriptors / Indicators: Sore Pain Intervention(s): Monitored during session;Repositioned    Home Living Family/patient expects to be discharged to:: Private residence Living Arrangements: Alone Available Help at Discharge: Family;Friend(s);Available 24 hours/day Type of Home: House Home Access: Stairs to enter   Home Layout:  One level Home Equipment: None      Prior Function Level of Independence: Independent          PT Goals (current goals can now be found in the care plan section) Acute Rehab PT Goals Patient Stated Goal: decrease pain, return to independence PT Goal Formulation: With patient/family Time For Goal  Achievement: 11/05/17 Potential to Achieve Goals: Good Progress towards PT goals: Progressing toward goals    Frequency    7X/week      PT Plan Current plan remains appropriate    Co-evaluation              AM-PAC PT "6 Clicks" Daily Activity  Outcome Measure  Difficulty turning over in bed (including adjusting bedclothes, sheets and blankets)?: Unable Difficulty moving from lying on back to sitting on the side of the bed? : Unable Difficulty sitting down on and standing up from a chair with arms (e.g., wheelchair, bedside commode, etc,.)?: Unable Help needed moving to and from a bed to chair (including a wheelchair)?: A Little Help needed walking in hospital room?: A Little Help needed climbing 3-5 steps with a railing? : A Little 6 Click Score: 12    End of Session Equipment Utilized During Treatment: Gait belt Activity Tolerance: Patient limited by pain;Patient limited by fatigue Patient left: in bed;with call bell/phone within reach;with family/visitor present Nurse Communication: Mobility status PT Visit Diagnosis: Other abnormalities of gait and mobility (R26.89);Pain Pain - Right/Left: Left Pain - part of body: Hip     Time: 2500-3704 PT Time Calculation (min) (ACUTE ONLY): 21 min  Charges:  $Gait Training: 8-22 mins                    G Codes:       [place], [place], [place] 10/22/2017, 2:36 PM

## 2017-10-22 NOTE — Evaluation (Signed)
Occupational Therapy Evaluation Patient Details Name: Renee Hawkins MRN: [id number] DOB: Sep 18, 1940 Today's Date: 10/22/2017    History of Present Illness Pt is a 77 y/o female s/p L THA secondary to sustaining a mechanical fall at home in which she fractured her L femoral neck. PMH including but not limited to COPD   Clinical Impression   Pt with decline in function and safety with ADLs and ADL mobility with decreased strength, balance and endurance. Pt would benefit from acute OT services to maximize level of function and safety. PTA, pt lived at home alone and was independent and would like to have ST rehab at [facility] after acute d/c.    Follow Up Recommendations  SNF    Equipment Recommendations  Other (comment)(TBD at SNF)    Recommendations for Other Services       Precautions / Restrictions Precautions Precautions: Fall Restrictions Weight Bearing Restrictions: Yes LLE Weight Bearing: Weight bearing as tolerated      Mobility Bed Mobility               General bed mobility comments: pt OOB in recliner chair upon arrival  Transfers Overall transfer level: Needs assistance Equipment used: Rolling walker (2 wheeled) Transfers: Sit to/from Stand Sit to Stand: Min guard         General transfer comment: increased time and effort, min guard for safety    Balance Overall balance assessment: Needs assistance;History of Falls Sitting-balance support: Feet supported Sitting balance-Leahy Scale: Good     Standing balance support: During functional activity;Bilateral upper extremity supported Standing balance-Leahy Scale: Poor                             ADL either performed or assessed with clinical judgement   ADL Overall ADL's : Needs assistance/impaired Eating/Feeding: Independent;Sitting   Grooming: Wash/dry hands;Wash/dry face;Oral care;Min guard;Standing   Upper Body Bathing: Supervision/ safety;Set up;Sitting   Lower Body Bathing:  Moderate assistance   Upper Body Dressing : Set up;Supervision/safety;Sitting   Lower Body Dressing: Moderate assistance   Toilet Transfer: Min guard;Ambulation;RW;Comfort height toilet;Grab bars;BSC   Toileting- Clothing Manipulation and Hygiene: Minimal assistance;Sit to/from stand       Functional mobility during ADLs: Min guard;Rolling walker General ADL Comments: pt participated in ADLs at sink     Vision Baseline Vision/History: Wears glasses Wears Glasses: Reading only Patient Visual Report: No change from baseline       Perception     Praxis      Pertinent Vitals/Pain Pain Assessment: 0-10 Pain Score: 2  Faces Pain Scale: Hurts a little bit Pain Location: L hip Pain Descriptors / Indicators: Sore Pain Intervention(s): Monitored during session;RN gave pain meds during session;Repositioned     Hand Dominance Right   Extremity/Trunk Assessment Upper Extremity Assessment Upper Extremity Assessment: Generalized weakness   Lower Extremity Assessment Lower Extremity Assessment: Defer to PT evaluation LLE Deficits / Details: pt with decreased strength and ROM limitations secondary to post-op pain and weakness. Pt with diminished sensation to light touch throughout as compared to R LE   Cervical / Trunk Assessment Cervical / Trunk Assessment: Normal   Communication Communication Communication: No difficulties   Cognition Arousal/Alertness: Awake/alert Behavior During Therapy: WFL for tasks assessed/performed Overall Cognitive Status: Within Functional Limits for tasks assessed  General Comments       Exercises     Shoulder Instructions      Home Living Family/patient expects to be discharged to:: Private residence Living Arrangements: Alone Available Help at Discharge: Family;Friend(s);Available 24 hours/day Type of Home: House Home Access: Stairs to enter [employer] of Steps: 1   Home  Layout: One level     Bathroom Shower/Tub: Teacher, early years/pre: Standard     Home Equipment: None          Prior Functioning/Environment Level of Independence: Independent                 OT Problem List: Decreased activity tolerance;Decreased knowledge of use of DME or AE;Impaired balance (sitting and/or standing);Decreased safety awareness;Pain      OT Treatment/Interventions: Self-care/ADL training;Therapeutic exercise;DME and/or AE instruction;Neuromuscular education;Therapeutic activities;Patient/family education    OT Goals(Current goals can be found in the care plan section) Acute Rehab OT Goals Patient Stated Goal: decrease pain, return to independence OT Goal Formulation: With patient/family Time For Goal Achievement: 11/05/17 Potential to Achieve Goals: Good ADL Goals Pt Will Perform Grooming: with supervision;with set-up;standing Pt Will Perform Lower Body Bathing: with min assist;sit to/from stand Pt Will Perform Lower Body Dressing: with min assist;sit to/from stand Pt Will Transfer to Toilet: with supervision;ambulating;grab bars Pt Will Perform Toileting - Clothing Manipulation and hygiene: with min guard assist;sit to/from stand  OT Frequency:     Barriers to D/C: Decreased caregiver support          Co-evaluation              AM-PAC PT "6 Clicks" Daily Activity     Outcome Measure Help from another person eating meals?: None Help from another person taking care of personal grooming?: A Little Help from another person toileting, which includes using toliet, bedpan, or urinal?: A Little Help from another person bathing (including washing, rinsing, drying)?: A Lot Help from another person to put on and taking off regular upper body clothing?: A Little Help from another person to put on and taking off regular lower body clothing?: A Lot 6 Click Score: 17   End of Session Equipment Utilized During Treatment: Gait belt;Rolling  walker;Other (comment)(3 in 1)  Activity Tolerance: Patient tolerated treatment well Patient left: in chair;with call bell/phone within reach;with family/visitor present;with nursing/sitter in room  OT Visit Diagnosis: Unsteadiness on feet (R26.81);Other abnormalities of gait and mobility (R26.89);Muscle weakness (generalized) (M62.81);History of falling (Z91.81);Pain Pain - Right/Left: Left Pain - part of body: Hip                Time: 0093-8182 OT Time Calculation (min): 28 min Charges:  OT Evaluation $OT Eval Moderate Complexity: 1 Mod OT Treatments $Self Care/Home Management : 8-22 mins G-Codes: OT G-codes **NOT FOR INPATIENT CLASS** Functional Assessment Tool Used: AM-PAC 6 Clicks Daily Activity     Britt Bottom 10/22/2017, 12:17 PM

## 2017-10-22 NOTE — Clinical Social Work Note (Signed)
Clinical Social Work Assessment  Patient Details  Name: Renee Hawkins MRN: [id number] Date of Birth: 1941/01/02  Date of referral:  10/22/17               Reason for consult:  Facility Placement                Permission sought to share information with:  Family Supports Permission granted to share information::  Yes, Verbal Permission Granted  Name::     Horris Latino  Agency::  [facility]  Relationship::  sister  Contact Information:  [phone]  Housing/Transportation Living arrangements for the past 2 months:  Single Family Home Source of Information:  Patient Patient Interpreter Needed:  None Criminal Activity/Legal Involvement Pertinent to Current Situation/Hospitalization:  No - Comment as needed Significant Relationships:  Siblings Lives with:  Self Do you feel safe going back to the place where you live?  No Need for family participation in patient care:  No (Coment)  Care giving concerns:  Pt alert and oriented. Pt was living alone, independently prior to admission.  Social Worker assessment / plan:  CSW spoke with pt at bedside. Pt's sister was also present at bedside. Pt is agreeable to SNF at d/c and is understanding of the recommendation. Pt wants to go to [place].  CSW spoke with [facility] and they will not have a bed until Tuesday of next week. Pt is agreeable to [facility]. CSW has reached out to [facility].  CSW will start [employer] authorization--CSW is unsure if we will get the authorization before the end of the day and they do not authorize on the weekend.  Employment status:  Retired Nurse, adult PT Recommendations:  [place] / Referral to community resources:  [place]  Patient/Family's Response to care:  Pt verbalized understanding of CSW role and expressed appreciation for support. Pt denies any concern regarding pt care at this time.   Patient/Family's Understanding  of and Emotional Response to Diagnosis, Current Treatment, and Prognosis:  Pt understanding and realistic regarding physical limitations. Pt understands the need for SNF placement at d/c. Pt agreeable to SNF placement at d/c, at this time. Pt's responses emotionally appropriate during conversation with CSW. Pt denies any concern regarding treatment plan at this time. CSW will continue to provide support and facilitate d/c needs.    Emotional Assessment Appearance:  Appears stated age Attitude/Demeanor/Rapport:  (PAtient was appropriate) Affect (typically observed):  Accepting, Appropriate, Calm Orientation:  Oriented to Self, Oriented to Place, Oriented to  Time, Oriented to Situation Alcohol / Substance use:  Not Applicable Psych involvement (Current and /or in the community):  No (Comment)  Discharge Needs  Concerns to be addressed:  Basic Needs, Care Coordination Readmission within the last 30 days:  No Current discharge risk:  Dependent with Mobility, Lives alone Barriers to Discharge:  Continued Medical Work up   [employer], LCSW 10/22/2017, 12:36 PM

## 2017-10-22 NOTE — Progress Notes (Signed)
Initial Nutrition Assessment  DOCUMENTATION CODES:   Not applicable  INTERVENTION:   Ensure Enlive po BID, each supplement provides 350 kcal and 20 grams of protein   NUTRITION DIAGNOSIS:   Increased nutrient needs related to (hip fx) as evidenced by estimated needs.  GOAL:   Patient will meet greater than or equal to 90% of their needs  MONITOR:   PO intake, Supplement acceptance  REASON FOR ASSESSMENT:   Consult Hip fracture protocol  ASSESSMENT:   Pt with PMH of COPD admitted after fall with hip fx s/p repair.    Per therapy plan for SNF at d/c. Per pt she has a good appetite but may have lost a couple of pounds this summer since she has not wanted to cook.  Pt eating lunch currently. She reports that she likes ensure and has consumed these in the past when needed.   Medications reviewed and include: vitamin D, colace, mag-ox, prednisone  Labs reviewed Meal Completion: 50%   NUTRITION - FOCUSED PHYSICAL EXAM:  Complete on follow up   Diet Order:   Diet Order           Diet regular Room service appropriate? Yes; Fluid consistency: Thin  Diet effective now          EDUCATION NEEDS:   Education needs have been addressed  Skin:  Skin Assessment: Reviewed RN Assessment  Last BM:  7/16  Height:   Ht Readings from Last 1 Encounters:  10/21/17 5\' 3"  (1.6 m)    Weight:   Wt Readings from Last 1 Encounters:  10/21/17 112 lb (50.8 kg)    Ideal Body Weight:  52.2 kg  BMI:  Body mass index is 19.84 kg/m.  Estimated Nutritional Needs:   Kcal:  1400-1600  Protein:  65-75 grams  Fluid:  > 1.5 L/day  Maylon Peppers RD, LDN, CNSC [phone] Pager [phone] After Hours Pager

## 2017-10-22 NOTE — NC FL2 (Signed)
[place] LEVEL OF CARE SCREENING TOOL     IDENTIFICATION  Patient Name: Renee Hawkins Birthdate: 1940-08-28 Sex: female Admission Date (Current Location): 10/20/2017  [facility] and [place] Number:  Herbalist and Address:  [facility], [place] [address], [place], [place] [address]      Provider Number: [id number]  Attending Physician Name and Address:  Hosie Poisson, MD  Relative Name and Phone Number:       Current Level of Care: Hospital Recommended Level of Care: [place] Prior Approval Number:    Date Approved/Denied:   PASRR Number: [id number]  Discharge Plan: SNF    Current Diagnoses: Patient Active Problem List   Diagnosis Date Noted  . Acute respiratory failure with hypoxia ([place]) 10/20/2017  . Closed left hip fracture, initial encounter ([place]) 10/20/2017  . COPD with acute exacerbation ([place]) 10/20/2017  . Pain of right humerus 10/12/2017  . Pain of left scapula 10/12/2017  . Elevated BP without diagnosis of hypertension 08/13/2017  . Hypokalemia 05/28/2016  . Follicular lymphoma grade iiia, lymph nodes of head, face, and neck ([place]) 02/11/2016  . Dyspnea 02/11/2016  . Encounter for antineoplastic chemotherapy 02/11/2016  . OSA and COPD overlap syndrome ([place]) 12/05/2014  . Osteoporosis 12/05/2014  . Medication management 03/06/2014  . CKD (chronic kidney disease) stage 2, GFR 60-89 ml/min 11/27/2013  . COPD (chronic obstructive pulmonary disease) with emphysema ([place]) 11/27/2013  . Hypertension   . Diverticulosis of colon with hemorrhage   . GERD    . Hx of adenomatous colonic polyps   . Hyperlipidemia   . Prediabetes   . Vitamin D deficiency   . RLS (restless legs syndrome)   . Vitamin B12 deficiency   . Anxiety     Orientation RESPIRATION BLADDER Height & Weight     Self, Time, Situation, Place  O2(Nasal Cannula 2L) Continent Weight: 112 lb (50.8 kg) Height:  5\' 3"  (160 cm)   BEHAVIORAL SYMPTOMS/MOOD NEUROLOGICAL BOWEL NUTRITION STATUS      Continent Diet(regular, thin liquids)  AMBULATORY STATUS COMMUNICATION OF NEEDS Skin   Limited Assist Verbally Surgical wounds(Closed incision left hip, Hydrocolloid dressing)                       Personal Care Assistance Level of Assistance  Dressing, Feeding, Bathing Bathing Assistance: Limited assistance Feeding assistance: Independent Dressing Assistance: Limited assistance     Functional Limitations Info  Hearing, Speech, Sight Sight Info: Adequate Hearing Info: Adequate Speech Info: Adequate    SPECIAL CARE FACTORS FREQUENCY  PT (By licensed PT), OT (By licensed OT)     PT Frequency: 7x OT Frequency: 7x            Contractures Contractures Info: Not present    Additional Factors Info  Code Status, Allergies Code Status Info: Full Code Allergies Info: No known allergies           Current Medications (10/22/2017):  This is the current hospital active medication list Current Facility-Administered Medications  Medication Dose Route Frequency Provider Last Rate Last Dose  . 0.9 %  sodium chloride infusion   Intravenous Continuous Leandrew Koyanagi, MD 75 mL/hr at 10/22/17 0313    . acetaminophen (TYLENOL) tablet 325-650 mg  325-650 mg Oral Q6H PRN Leandrew Koyanagi, MD      . acetaminophen (TYLENOL) tablet 500 mg  500 mg Oral Q6H Leandrew Koyanagi, MD   500 mg at 10/22/17  Liza.Sleeper  . alum & mag hydroxide-simeth (MAALOX/MYLANTA) 200-200-20 MG/5ML suspension 30 mL  30 mL Oral Q4H PRN Leandrew Koyanagi, MD      . arformoterol [facility]) nebulizer solution 15 mcg  15 mcg Nebulization BID Leandrew Koyanagi, MD   15 mcg at 10/22/17 0825  . bisacodyl (DULCOLAX) EC tablet 5 mg  5 mg Oral Daily PRN Leandrew Koyanagi, MD      . budesonide (PULMICORT) nebulizer solution 0.25 mg  0.25 mg Nebulization BID Leandrew Koyanagi, MD   0.25 mg at 10/22/17 0825  . ceFAZolin (ANCEF) IVPB 2g/100 mL premix  2 g Intravenous Q6H Leandrew Koyanagi, MD 200  mL/hr at 10/22/17 0548 2 g at 10/22/17 0548  . cholecalciferol (VITAMIN D) tablet 4,000 Units  4,000 Units Oral Daily Leandrew Koyanagi, MD   4,000 Units at 10/22/17 1013  . docusate sodium (COLACE) capsule 100 mg  100 mg Oral BID Leandrew Koyanagi, MD   100 mg at 10/22/17 1013  . doxycycline (VIBRA-TABS) tablet 100 mg  100 mg Oral Q12H Leandrew Koyanagi, MD   100 mg at 10/22/17 1013  . enoxaparin (LOVENOX) injection 40 mg  40 mg Subcutaneous Q24H Leandrew Koyanagi, MD      . gabapentin (NEURONTIN) capsule 300 mg  300 mg Oral TID PRN Leandrew Koyanagi, MD   300 mg at 10/21/17 2133  . HYDROcodone-acetaminophen (NORCO) 7.5-325 MG per tablet 1-2 tablet  1-2 tablet Oral Q4H PRN Leandrew Koyanagi, MD      . HYDROcodone-acetaminophen (NORCO/VICODIN) 5-325 MG per tablet 1-2 tablet  1-2 tablet Oral Q4H PRN Leandrew Koyanagi, MD      . ipratropium-albuterol (DUONEB) 0.5-2.5 (3) MG/3ML nebulizer solution 3 mL  3 mL Nebulization Q6H PRN Leandrew Koyanagi, MD      . lactated ringers infusion   Intravenous Continuous Leandrew Koyanagi, MD 10 mL/hr at 10/21/17 1355    . magnesium citrate solution 1 Bottle  1 Bottle Oral Once PRN Leandrew Koyanagi, MD      . magnesium oxide (MAG-OX) tablet 400 mg  400 mg Oral BID Leandrew Koyanagi, MD   400 mg at 10/22/17 1013  . menthol-cetylpyridinium (CEPACOL) lozenge 3 mg  1 lozenge Oral PRN Leandrew Koyanagi, MD       Or  . phenol (CHLORASEPTIC) mouth spray 1 spray  1 spray Mouth/Throat PRN Leandrew Koyanagi, MD      . methocarbamol (ROBAXIN) tablet 500 mg  500 mg Oral Q6H PRN Leandrew Koyanagi, MD       Or  . methocarbamol (ROBAXIN) 500 mg in dextrose 5 % 50 mL IVPB  500 mg Intravenous Q6H PRN Leandrew Koyanagi, MD      . morphine 2 MG/ML injection 1 mg  1 mg Intravenous Q2H PRN Leandrew Koyanagi, MD   1 mg at 10/21/17 2248  . ondansetron (ZOFRAN) tablet 4 mg  4 mg Oral Q6H PRN Leandrew Koyanagi, MD       Or  . ondansetron [facility]) injection 4 mg  4 mg Intravenous Q6H PRN Leandrew Koyanagi, MD      . pantoprazole (PROTONIX) EC tablet 40 mg   40 mg Oral Daily Leandrew Koyanagi, MD   40 mg at 10/22/17 1013  . polyethylene glycol (MIRALAX / GLYCOLAX) packet 17 g  17 g Oral Daily PRN Leandrew Koyanagi, MD      . predniSONE (DELTASONE) tablet 40 mg  40 mg Oral Q breakfast Hosie Poisson, MD   40 mg at 10/22/17 1014  . rosuvastatin (CRESTOR) tablet 20 mg  20 mg Oral q1800 Leandrew Koyanagi, MD      . sorbitol 70 % solution 30 mL  30 mL Oral Daily PRN Leandrew Koyanagi, MD         Discharge Medications: Please see discharge summary for a list of discharge medications.  Relevant Imaging Results:  Relevant Lab Results:   Additional Information SSN: [id number]  Eileen Stanford, LCSW

## 2017-10-22 NOTE — Progress Notes (Signed)
Subjective: 1 Day Post-Op Procedure(s) (LRB): TOTAL HIP ARTHROPLASTY ANTERIOR APPROACH (Left) Patient reports pain as moderate.  Moderate pain overnight.  Much better this am.  No nausea/vomiting, lightheadedness/dizziness, chest pain/sob.  Objective: Vital signs in last 24 hours: Temp:  [96.4 F (35.8 C)-97.9 F (36.6 C)] 97.9 F (36.6 C) (07/19 0537) Pulse Rate:  [71-88] 74 (07/19 0537) Resp:  [10-18] 16 (07/18 2300) BP: (106-163)/(53-97) 126/70 (07/19 0537) SpO2:  [84 %-99 %] 94 % (07/19 0547) Weight:  [112 lb (50.8 kg)] 112 lb (50.8 kg) (07/18 1353)  Intake/Output from previous day: 07/18 0701 - 07/19 0700 In: 2854.1 [P.O.:240; I.V.:2516.5; IV Piggyback:97.7] Out: 1550 [Urine:1250; Blood:300] Intake/Output this shift: No intake/output data recorded.  Recent Labs    10/20/17 1554 10/21/17 2156 10/22/17 0418  HGB 12.7 10.1* 9.3*   Recent Labs    10/21/17 2156 10/22/17 0418  WBC 15.0* 9.7  RBC 3.20* 3.00*  HCT 30.9* 29.3*  PLT 141* 132*   Recent Labs    10/21/17 0033 10/21/17 2156 10/22/17 0418  NA 140  --  141  K 3.7  --  4.2  CL 101  --  109  CO2 25  --  27  BUN 12  --  14  CREATININE 1.18* 0.88 0.83  GLUCOSE 238*  --  126*  CALCIUM 9.1  --  7.9*   Recent Labs    10/20/17 1644  INR 0.92    Neurologically intact Neurovascular intact Sensation intact distally Intact pulses distally Dorsiflexion/Plantar flexion intact Incision: dressing C/D/I No cellulitis present Compartment soft    Assessment/Plan: 1 Day Post-Op Procedure(s) (LRB): TOTAL HIP ARTHROPLASTY ANTERIOR APPROACH (Left) Advance diet Up with therapy  WBAT LLE-no precautions ABLA-mild and stable D/C dispo per medicine    Renee Hawkins 10/22/2017, 7:47 AM

## 2017-10-22 NOTE — Progress Notes (Addendum)
Spoke with pharmacy after sending missing dose message for ancef starting at 0300 am that medication was not sent. Pharmacy stated that medication will be sent in 10-15 min.

## 2017-10-22 NOTE — Evaluation (Signed)
Physical Therapy Evaluation Patient Details Name: Renee Hawkins MRN: [id number] DOB: Aug 24, 1940 Today's Date: 10/22/2017   History of Present Illness  Pt is a 77 y/o female s/p L THA secondary to sustaining a mechanical fall at home in which she fractured her L femoral neck. PMH including but not limited to COPD    Clinical Impression  Pt presented sitting OOB in recliner chair, awake and willing to participate in therapy session. Prior to admission, pt reported that she was independent with all functional mobility and ADLs. Pt lives alone and has family/neighbors that can assist 24/7 if needed; however, sister reported that she would not be able to physically assist pt. Pt currently able to perform transfers with min guard and ambulate a very short distance within her room with RW and min guard for safety. Pt would continue to benefit from skilled physical therapy services at this time while admitted and after d/c to address the below listed limitations in order to improve overall safety and independence with functional mobility.     Follow Up Recommendations SNF;Other (comment)(ST rehab prior to returning home)    Equipment Recommendations  None recommended by PT    Recommendations for Other Services       Precautions / Restrictions Precautions Precautions: Fall Restrictions Weight Bearing Restrictions: Yes LLE Weight Bearing: Weight bearing as tolerated      Mobility  Bed Mobility               General bed mobility comments: pt OOB in recliner chair upon arrival  Transfers Overall transfer level: Needs assistance Equipment used: Rolling walker (2 wheeled) Transfers: Sit to/from Stand Sit to Stand: Min guard         General transfer comment: increased time and effort, min guard for safety  Ambulation/Gait Ambulation/Gait assistance: Min guard Gait Distance (Feet): 20 Feet Assistive device: Rolling walker (2 wheeled) Gait Pattern/deviations: Decreased step  length - right;Decreased step length - left;Decreased stride length;Step-to pattern;Step-through pattern;Decreased stance time - left;Decreased weight shift to left Gait velocity: decreased Gait velocity interpretation: <1.31 ft/sec, indicative of household ambulator General Gait Details: pt with mild instability but no overt LOB or need for physical assistance, close min guard for safety. pt required cueing for sequencing with RW. pt limited secondary to pain and fatigue  Stairs            Wheelchair Mobility    Modified Rankin (Stroke Patients Only)       Balance Overall balance assessment: Needs assistance;History of Falls Sitting-balance support: Feet supported Sitting balance-Leahy Scale: Good     Standing balance support: During functional activity;Bilateral upper extremity supported Standing balance-Leahy Scale: Poor                               Pertinent Vitals/Pain Pain Assessment: Faces Faces Pain Scale: Hurts a little bit Pain Location: L hip Pain Descriptors / Indicators: Sore Pain Intervention(s): Monitored during session;Repositioned    Home Living Family/patient expects to be discharged to:: Private residence Living Arrangements: Alone Available Help at Discharge: Family;Friend(s);Available 24 hours/day Type of Home: House Home Access: Stairs to enter   [employer] of Steps: 1 Home Layout: One level Home Equipment: None      Prior Function Level of Independence: Independent               Hand Dominance        Extremity/Trunk Assessment   Upper Extremity Assessment Upper  Extremity Assessment: Defer to OT evaluation;Overall [facility] for tasks assessed    Lower Extremity Assessment Lower Extremity Assessment: Generalized weakness;LLE deficits/detail LLE Deficits / Details: pt with decreased strength and ROM limitations secondary to post-op pain and weakness. Pt with diminished sensation to light touch throughout as  compared to R LE       Communication   Communication: No difficulties  Cognition Arousal/Alertness: Awake/alert Behavior During Therapy: WFL for tasks assessed/performed Overall Cognitive Status: Within Functional Limits for tasks assessed                                        General Comments      Exercises     Assessment/Plan    PT Assessment Patient needs continued PT services  PT Problem List Decreased strength;Decreased range of motion;Decreased activity tolerance;Decreased balance;Decreased mobility;Decreased coordination;Decreased knowledge of use of DME;Decreased safety awareness;Pain;Cardiopulmonary status limiting activity;Decreased knowledge of precautions       PT Treatment Interventions DME instruction;Therapeutic exercise;Balance training;Gait training;Stair training;Functional mobility training;Therapeutic activities;Neuromuscular re-education;Patient/family education    PT Goals (Current goals can be found in the Care Plan section)  Acute Rehab PT Goals Patient Stated Goal: decrease pain, return to independence PT Goal Formulation: With patient/family Time For Goal Achievement: 11/05/17 Potential to Achieve Goals: Good    Frequency 7X/week   Barriers to discharge        Co-evaluation               AM-PAC PT "6 Clicks" Daily Activity  Outcome Measure Difficulty turning over in bed (including adjusting bedclothes, sheets and blankets)?: A Little Difficulty moving from lying on back to sitting on the side of the bed? : A Little Difficulty sitting down on and standing up from a chair with arms (e.g., wheelchair, bedside commode, etc,.)?: Unable Help needed moving to and from a bed to chair (including a wheelchair)?: A Little Help needed walking in hospital room?: A Little Help needed climbing 3-5 steps with a railing? : A Little 6 Click Score: 16    End of Session Equipment Utilized During Treatment: Gait belt Activity  Tolerance: Patient limited by pain;Patient limited by fatigue Patient left: in chair;with call bell/phone within reach;with family/visitor present Nurse Communication: Mobility status;Other (comment)(SPO2 decreased with activity on RA; reapplied [place]) PT Visit Diagnosis: Other abnormalities of gait and mobility (R26.89);Pain Pain - Right/Left: Left Pain - part of body: Hip    Time: 8527-7824 PT Time Calculation (min) (ACUTE ONLY): 27 min   Charges:   PT Evaluation $PT Eval Moderate Complexity: 1 Mod PT Treatments $Gait Training: 8-22 mins   PT G Codes:        Coy, PT, DPT [place] 10/22/2017, 10:56 AM

## 2017-10-22 NOTE — Progress Notes (Signed)
Pt refused CPAP for tonight, stated that she was in too much pain. Pt has home nasal prongs at bedside and I educated pt that when she is ready to wear CPAP, I would connect hospital machine to her mask. Advised pt to call.. RN aware as well.

## 2017-10-22 NOTE — Progress Notes (Signed)
PROGRESS NOTE    Renee Hawkins  GEX:[id number] DOB: Sep 21, 1940 DOA: 10/20/2017 PCP: Unk Pinto, MD    Brief Narrative: Renee Hawkins is a 77 y.o. female with medical history significant for COPD, now presenting to the emergency department for evaluation of left hip pain after a ground-level mechanical fall.  She was also found to have sob, hypoxic and wheezing, with productive cough with clear sputum. Chest x-ray is consistent with emphysema, but no acute finding.  Radiographs of the pelvis are notable for impacted fracture involving the left femoral neck.  CTA chest is negative for PE or acute finding, notable for emphysema and age-indeterminate T12 inferior endplate fracture, and stable nodules bilaterally.    Assessment & Plan:   Principal Problem:   Closed left hip fracture, initial encounter ([place]) Active Problems:   Acute respiratory failure with hypoxia (HCC)   COPD with acute exacerbation (HCC)   Acute respiratory failure with hypoxia secondary to Acute COPD exacerbation:  Admitted for IV steroids, duo nebs and use  [place] oxygen as needed to keep sats greater than 90%.  Resume Brovana and pulmicort.  Resume doxycycline for bronchitis.  Her sob and wheezing has resolved. Tapering steroids.  Currently on 2 lit of [place] oxygen. Wean her off the the oxygen in the next 24 hours.    Closed left femoral neck fracture: Dr Marlou Sa consulted by EDP at [facility]  Transferred to [facility], for surgical repair. Underwent total hip arthroplasty and working with PT. Social work consult for SNF placement.  Pain control.    Mild AKI:  Baseline creatinine is less than 1. On admission it was 1.08, back to baseline today.     Hypertension:  Well controlled.    Hyperglycemia:   A1c was 5.4 in May  Ordered SSI.     GERD: stable.    Hyperlipidemia: resume crestor.   Constipation:  On stool softeners.     DVT prophylaxis: Lovenox.  Code Status: full code.  Family Communication:  none at bedside.  Disposition Plan: discharge to SNF in 1 to 2 days, when bed available.    Consultants:   Orthopedics.    Procedures: None.   Antimicrobials:none.    Subjective: Leg pain and left hip pain better,  Breathing better.no cough.  Objective: Vitals:   10/22/17 0537 10/22/17 0547 10/22/17 0826 10/22/17 0827  BP: 126/70     Pulse: 74     Resp:      Temp: 97.9 F (36.6 C)     TempSrc: Oral     SpO2: 97% 94% 94% 94%  Weight:      Height:        Intake/Output Summary (Last 24 hours) at 10/22/2017 1024 Last data filed at 10/22/2017 0600 Gross per 24 hour  Intake 2339.96 ml  Output 1550 ml  Net 789.96 ml   Filed Weights   10/20/17 1537 10/21/17 1353  Weight: 50.8 kg (112 lb) 50.8 kg (112 lb)    Examination:  General exam: Appears calm and comfortable on 2 lit of [place] oxygen ,no distress.  Respiratory system: no wheezing heard today. Air entry fair.  Cardiovascular system: S1 & S2 heard, RRR. No JVD, No pedal edema. Gastrointestinal system: Abdomen is soft NT ND BS+ Central nervous system: Alert and oriented. No focal neurological deficits. Extremities: no pedal edema.  Skin: No rashes, lesions or ulcers Psychiatry:  Mood & affect appropriate.     Data Reviewed: I have personally reviewed following labs and imaging studies  CBC: Recent  Labs  Lab 10/20/17 1554 10/21/17 2156 10/22/17 0418  WBC 8.5 15.0* 9.7  NEUTROABS 6.9  --   --   HGB 12.7 10.1* 9.3*  HCT 38.4 30.9* 29.3*  MCV 96.2 96.6 97.7  PLT 184 141* 323*   Basic Metabolic Panel: Recent Labs  Lab 10/20/17 1554 10/21/17 0033 10/21/17 2156 10/22/17 0418  NA 137 140  --  141  K 3.9 3.7  --  4.2  CL 96* 101  --  109  CO2 28 25  --  27  GLUCOSE 102* 238*  --  126*  BUN 18 12  --  14  CREATININE 1.13* 1.18* 0.88 0.83  CALCIUM 9.4 9.1  --  7.9*   GFR: Estimated Creatinine Clearance: 47 mL/min (by C-G formula based on SCr of 0.83 mg/dL). Liver Function Tests: No results for  input(s): AST, ALT, ALKPHOS, BILITOT, PROT, ALBUMIN in the last 168 hours. No results for input(s): LIPASE, AMYLASE in the last 168 hours. No results for input(s): AMMONIA in the last 168 hours. Coagulation Profile: Recent Labs  Lab 10/20/17 1644  INR 0.92   Cardiac Enzymes: Recent Labs  Lab 10/20/17 1554  TROPONINI <0.03   BNP (last 3 results) No results for input(s): PROBNP in the last 8760 hours. HbA1C: No results for input(s): HGBA1C in the last 72 hours. CBG: Recent Labs  Lab 10/21/17 1322  GLUCAP 121*   Lipid Profile: No results for input(s): CHOL, HDL, LDLCALC, TRIG, CHOLHDL, LDLDIRECT in the last 72 hours. Thyroid Function Tests: No results for input(s): TSH, T4TOTAL, FREET4, T3FREE, THYROIDAB in the last 72 hours. Anemia Panel: No results for input(s): VITAMINB12, FOLATE, FERRITIN, TIBC, IRON, RETICCTPCT in the last 72 hours. Sepsis Labs: No results for input(s): PROCALCITON, LATICACIDVEN in the last 168 hours.  Recent Results (from the past 240 hour(s))  Surgical pcr screen     Status: Abnormal   Collection Time: 10/21/17  1:07 AM  Result Value Ref Range Status   MRSA, PCR NEGATIVE NEGATIVE Final   Staphylococcus aureus POSITIVE (A) NEGATIVE Final    Comment: (NOTE) The Xpert SA Assay (FDA approved for NASAL specimens in patients 58 years of age and older), is one component of a comprehensive surveillance program. It is not intended to diagnose infection nor to guide or monitor treatment. Performed at [facility], [place] [address] [place], [place] [address]          Radiology Studies: Dg Chest 2 View  Result Date: 10/20/2017 CLINICAL DATA:  Shortness of breath today, history COPD, hypertension, former smoker, history of follicular lymphoma EXAM: CHEST - 2 VIEW COMPARISON:  11/10/2011 FINDINGS: Normal heart size, mediastinal contours, and pulmonary vascularity. Atherosclerotic calcification aorta. Emphysematous changes without pulmonary  infiltrate, pleural effusion or pneumothorax. Bones demineralized. IMPRESSION: Emphysematous changes without acute abnormalities. Electronically Signed   By: Lavonia Dana M.D.   On: 10/20/2017 16:46   Ct Angio Chest Pe W And/or Wo Contrast  Result Date: 10/20/2017 CLINICAL DATA:  Cough and shortness of breath EXAM: CT ANGIOGRAPHY CHEST WITH CONTRAST TECHNIQUE: Multidetector CT imaging of the chest was performed using the standard protocol during bolus administration of intravenous contrast. Multiplanar CT image reconstructions and MIPs were obtained to evaluate the vascular anatomy. CONTRAST:  125mL ISOVUE-370 IOPAMIDOL (ISOVUE-370) INJECTION 76% COMPARISON:  Chest CT 05/26/2012 FINDINGS: Cardiovascular: --Pulmonary arteries: Contrast injection is sufficient to demonstrate satisfactory opacification of the pulmonary arteries to the segmental level. There is no pulmonary embolus. The main pulmonary artery is within  normal limits for size. --Aorta: Satisfactory opacification of the thoracic aorta. No aortic dissection or other acute aortic syndrome. Normal three-vessel pattern. Multifocal atherosclerosis of the proximal left vertebral artery. The aortic course and caliber are normal. There is moderate aortic atherosclerosis. --Heart: Normal size. No pericardial effusion. Mediastinum/Nodes: No mediastinal, hilar or axillary lymphadenopathy. Small sliding hiatal hernia. Lungs/Pleura: Unchanged 6 mm left lower lobe pulmonary nodule. Multiple peripheral nodules in the right middle lobe. Apical predominant emphysema. Multiple peripheral left lower lobe nodules are also noted, unchanged. Upper Abdomen: Contrast bolus timing is not optimized for evaluation of the abdominal organs. Within this limitation, the visualized organs of the upper abdomen are normal. Musculoskeletal: There is fracture of the inferior endplate of J00, age indeterminate. Review of the MIP images confirms the above findings. IMPRESSION: 1. No  pulmonary embolus or acute aortic syndrome. 2. Aortic Atherosclerosis (ICD10-I70.0) and Emphysema (ICD10-J43.9). 3. T12 inferior endplate fracture, age indeterminate. Correlate for history of trauma. Thoracic spine MRI may be helpful for further temporal characterization. 4. Multiple bilateral pulmonary nodules, measuring up to 6 mm, unchanged. Electronically Signed   By: Ulyses Jarred M.D.   On: 10/20/2017 19:51   Pelvis Portable  Result Date: 10/22/2017 CLINICAL DATA:  Left hip arthroplasty today. EXAM: PORTABLE PELVIS 1-2 VIEWS COMPARISON:  Intraoperative images from earlier on the same day. FINDINGS: Soft tissue emphysema from recent surgery adjacent to new left uncemented total hip arthroplasty. No hardware failure nor immediate postoperative complications. Single encircling cerclage wire is seen along the subtrochanteric portion of the left femur. IMPRESSION: Postop change status post recent left total hip arthroplasty without complicating features. Electronically Signed   By: Ashley Royalty M.D.   On: 10/22/2017 01:04   Dg C-arm 1-60 Min  Result Date: 10/21/2017 CLINICAL DATA:  Total left hip arthroplasty for left femoral neck fracture. EXAM: DG C-ARM 61-120 MIN; OPERATIVE LEFT HIP WITH PELVIS COMPARISON:  Preoperative study from 10/20/2017 FINDINGS: 5 C-arm fluoroscopic views were acquired during placement of a left total uncemented hip arthroplasty. A total of 36.2 seconds of fluoroscopic time was utilized. Images demonstrate an uncemented left total hip arthroplasty in place of the previously noted impacted subcapital left femoral neck fracture. Single cerclage wire is seen traversing the subtrochanteric portion of the left femur. No immediate intraoperative complications are identified. IMPRESSION: Fluoroscopic time utilized for left total hip arthroplasty. No immediate intraoperative complication identified on images provided. Electronically Signed   By: Ashley Royalty M.D.   On: 10/21/2017 18:25    Dg Hip Operative Unilat W Or W/o Pelvis Left  Result Date: 10/21/2017 CLINICAL DATA:  Total left hip arthroplasty for left femoral neck fracture. EXAM: DG C-ARM 61-120 MIN; OPERATIVE LEFT HIP WITH PELVIS COMPARISON:  Preoperative study from 10/20/2017 FINDINGS: 5 C-arm fluoroscopic views were acquired during placement of a left total uncemented hip arthroplasty. A total of 36.2 seconds of fluoroscopic time was utilized. Images demonstrate an uncemented left total hip arthroplasty in place of the previously noted impacted subcapital left femoral neck fracture. Single cerclage wire is seen traversing the subtrochanteric portion of the left femur. No immediate intraoperative complications are identified. IMPRESSION: Fluoroscopic time utilized for left total hip arthroplasty. No immediate intraoperative complication identified on images provided. Electronically Signed   By: Ashley Royalty M.D.   On: 10/21/2017 18:25   Dg Hip Unilat W Or Wo Pelvis 2-3 Views Left  Result Date: 10/20/2017 CLINICAL DATA:  Fall with left hip pain EXAM: DG HIP (WITH OR WITHOUT PELVIS) 2-3V LEFT  COMPARISON:  None. FINDINGS: There is an impacted fracture of the left femoral neck. Femoral head remains approximated in the acetabulum. IMPRESSION: Impacted fracture of the left femoral neck. Electronically Signed   By: Ulyses Jarred M.D.   On: 10/20/2017 16:36        Scheduled Meds: . acetaminophen  500 mg Oral Q6H  . arformoterol  15 mcg Nebulization BID  . budesonide (PULMICORT) nebulizer solution  0.25 mg Nebulization BID  . cholecalciferol  4,000 Units Oral Daily  . docusate sodium  100 mg Oral BID  . doxycycline  100 mg Oral Q12H  . enoxaparin (LOVENOX) injection  40 mg Subcutaneous Q24H  . magnesium oxide  400 mg Oral BID  . pantoprazole  40 mg Oral Daily  . predniSONE  40 mg Oral Q breakfast  . rosuvastatin  20 mg Oral q1800   Continuous Infusions: . sodium chloride 75 mL/hr at 10/22/17 0313  .  ceFAZolin (ANCEF)  IV 2 g (10/22/17 0548)  . lactated ringers 10 mL/hr at 10/21/17 1355  . methocarbamol (ROBAXIN)  IV       LOS: 2 days    Time spent: 35 minutes.     Hosie Poisson, MD [facility] Pager [phone]  If 7PM-7AM, please contact night-coverage [email or web address] Password [facility] 10/22/2017, 10:24 AM

## 2017-10-23 LAB — BASIC METABOLIC PANEL
Anion gap: 7 (ref 5–15)
BUN: 14 mg/dL (ref 8–23)
CHLORIDE: 105 mmol/L (ref 98–111)
CO2: 30 mmol/L (ref 22–32)
Calcium: 8.4 mg/dL — ABNORMAL LOW (ref 8.9–10.3)
Creatinine, Ser: 0.9 mg/dL (ref 0.44–1.00)
GFR calc Af Amer: >60 mL/min (ref >60)
GFR calc non Af Amer: >60 mL/min (ref >60)
Glucose, Bld: 99 mg/dL (ref 70–99)
POTASSIUM: 3.5 mmol/L (ref 3.5–5.1)
SODIUM: 142 mmol/L (ref 135–145)

## 2017-10-23 LAB — CBC
HCT: 30.7 % — ABNORMAL LOW (ref 36.0–46.0)
HEMOGLOBIN: 9.7 g/dL — AB (ref 12.0–15.0)
MCH: 31 pg (ref 26.0–34.0)
MCHC: 31.6 g/dL (ref 30.0–36.0)
MCV: 98.1 fL (ref 78.0–100.0)
Platelets: 139 10*3/uL — ABNORMAL LOW (ref 150–400)
RBC: 3.13 MIL/uL — ABNORMAL LOW (ref 3.87–5.11)
RDW: 13.6 % (ref 11.5–15.5)
WBC: 8.9 10*3/uL (ref 4.0–10.5)

## 2017-10-23 MED ORDER — BENZONATATE 100 MG PO CAPS
200.0000 mg | ORAL_CAPSULE | Freq: Three times a day (TID) | ORAL | Status: DC
  Administered 2017-10-23 – 2017-10-26 (×10): 200 mg via ORAL
  Filled 2017-10-23 (×10): qty 2

## 2017-10-23 MED ORDER — GUAIFENESIN-DM 100-10 MG/5ML PO SYRP
5.0000 mL | ORAL_SOLUTION | ORAL | Status: DC | PRN
  Filled 2017-10-23: qty 5

## 2017-10-23 NOTE — Progress Notes (Signed)
PROGRESS NOTE    Renee Hawkins  VEH:[id number] DOB: December 09, 1940 DOA: 10/20/2017 PCP: Unk Pinto, MD    Brief Narrative: Renee Hawkins is a 77 y.o. female with medical history significant for COPD, now presenting to the emergency department for evaluation of left hip pain after a ground-level mechanical fall.  She was also found to have sob, hypoxic and wheezing, with productive cough with clear sputum. Chest x-ray is consistent with emphysema, but no acute finding.  Radiographs of the pelvis are notable for impacted fracture involving the left femoral neck.  CTA chest is negative for PE or acute finding, notable for emphysema and age-indeterminate T12 inferior endplate fracture, and stable nodules bilaterally.    Assessment & Plan:   Principal Problem:   Closed left hip fracture, initial encounter ([place]) Active Problems:   Acute respiratory failure with hypoxia (HCC)   COPD with acute exacerbation (HCC)   Acute respiratory failure with hypoxia secondary to Acute COPD exacerbation:  Admitted for IV steroids, duo nebs and use [place] oxygen as needed to keep sats greater than 90%.  Resume Brovana and pulmicort.  Resume doxycycline for bronchitis.  . Tapering steroids. Scattered wheezing heard today.  Weaned her off the oxygen.    Closed left femoral neck fracture: Dr Marlou Sa consulted by EDP at [facility].  Transferred to [facility], for surgical repair. Underwent total hip arthroplasty and working with PT. Social work consult for SNF placement. Awaiting insurance authorization.  Pain control.    Mild AKI:  Baseline creatinine is less than 1. On admission it was 1.08, back to baseline.     Hypertension:  Well controlled.    Hyperglycemia:   A1c was 5.4 in May  Ordered SSI.     GERD: stable.    Hyperlipidemia: resume crestor.   Constipation:  On stool softeners.     DVT prophylaxis: Lovenox.  Code Status: full code.  Family Communication: none at bedside.  Disposition  Plan: discharge to SNF on Monday.    Consultants:   Orthopedics.    Procedures: None.   Antimicrobials:none.    Subjective: No chest pain or sob.  Coughing and indigestion.  Objective: Vitals:   10/22/17 2151 10/23/17 0644 10/23/17 0900 10/23/17 0908  BP: (!) 127/59 128/61    Pulse: 87 81    Resp:      Temp: 98.6 F (37 C) 98.8 F (37.1 C)    TempSrc: Oral Oral    SpO2: 91% (!) 87% 92% 92%  Weight:      Height:        Intake/Output Summary (Last 24 hours) at 10/23/2017 1421 Last data filed at 10/23/2017 0900 Gross per 24 hour  Intake 120 ml  Output 350 ml  Net -230 ml   Filed Weights   10/20/17 1537 10/21/17 1353  Weight: 50.8 kg (112 lb) 50.8 kg (112 lb)    Examination:  General exam: calm on RA no distress.  Respiratory system:scattered wheezing heard.  Cardiovascular system: S1 & S2 heard, RRR. No JVD, No pedal edema. Gastrointestinal system: Abdomen is soft non tender non distended bowel sounds good.  Central nervous system: Alert and oriented. Non focal. Extremities: no pedal edema.  Skin: No rashes, lesions or ulcers Psychiatry:  Mood & affect appropriate.     Data Reviewed: I have personally reviewed following labs and imaging studies  CBC: Recent Labs  Lab 10/20/17 1554 10/21/17 2156 10/22/17 0418 10/23/17 0355  WBC 8.5 15.0* 9.7 8.9  NEUTROABS 6.9  --   --   --  HGB 12.7 10.1* 9.3* 9.7*  HCT 38.4 30.9* 29.3* 30.7*  MCV 96.2 96.6 97.7 98.1  PLT 184 141* 132* 353*   Basic Metabolic Panel: Recent Labs  Lab 10/20/17 1554 10/21/17 0033 10/21/17 2156 10/22/17 0418 10/23/17 0355  NA 137 140  --  141 142  K 3.9 3.7  --  4.2 3.5  CL 96* 101  --  109 105  CO2 28 25  --  27 30  GLUCOSE 102* 238*  --  126* 99  BUN 18 12  --  14 14  CREATININE 1.13* 1.18* 0.88 0.83 0.90  CALCIUM 9.4 9.1  --  7.9* 8.4*   GFR: Estimated Creatinine Clearance: 43.3 mL/min (by C-G formula based on SCr of 0.9 mg/dL). Liver Function Tests: No results  for input(s): AST, ALT, ALKPHOS, BILITOT, PROT, ALBUMIN in the last 168 hours. No results for input(s): LIPASE, AMYLASE in the last 168 hours. No results for input(s): AMMONIA in the last 168 hours. Coagulation Profile: Recent Labs  Lab 10/20/17 1644  INR 0.92   Cardiac Enzymes: Recent Labs  Lab 10/20/17 1554  TROPONINI <0.03   BNP (last 3 results) No results for input(s): PROBNP in the last 8760 hours. HbA1C: No results for input(s): HGBA1C in the last 72 hours. CBG: Recent Labs  Lab 10/21/17 1322  GLUCAP 121*   Lipid Profile: No results for input(s): CHOL, HDL, LDLCALC, TRIG, CHOLHDL, LDLDIRECT in the last 72 hours. Thyroid Function Tests: No results for input(s): TSH, T4TOTAL, FREET4, T3FREE, THYROIDAB in the last 72 hours. Anemia Panel: No results for input(s): VITAMINB12, FOLATE, FERRITIN, TIBC, IRON, RETICCTPCT in the last 72 hours. Sepsis Labs: No results for input(s): PROCALCITON, LATICACIDVEN in the last 168 hours.  Recent Results (from the past 240 hour(s))  Surgical pcr screen     Status: Abnormal   Collection Time: 10/21/17  1:07 AM  Result Value Ref Range Status   MRSA, PCR NEGATIVE NEGATIVE Final   Staphylococcus aureus POSITIVE (A) NEGATIVE Final    Comment: (NOTE) The Xpert SA Assay (FDA approved for NASAL specimens in patients 44 years of age and older), is one component of a comprehensive surveillance program. It is not intended to diagnose infection nor to guide or monitor treatment. Performed at [facility], [place] [address], [place], [place] [address]          Radiology Studies: Pelvis Portable  Result Date: 10/22/2017 CLINICAL DATA:  Left hip arthroplasty today. EXAM: PORTABLE PELVIS 1-2 VIEWS COMPARISON:  Intraoperative images from earlier on the same day. FINDINGS: Soft tissue emphysema from recent surgery adjacent to new left uncemented total hip arthroplasty. No hardware failure nor immediate postoperative complications. Single  encircling cerclage wire is seen along the subtrochanteric portion of the left femur. IMPRESSION: Postop change status post recent left total hip arthroplasty without complicating features. Electronically Signed   By: Ashley Royalty M.D.   On: 10/22/2017 01:04        Scheduled Meds: . arformoterol  15 mcg Nebulization BID  . benzonatate  200 mg Oral TID  . budesonide (PULMICORT) nebulizer solution  0.25 mg Nebulization BID  . cholecalciferol  4,000 Units Oral Daily  . docusate sodium  100 mg Oral BID  . doxycycline  100 mg Oral Q12H  . enoxaparin (LOVENOX) injection  40 mg Subcutaneous Q24H  . feeding supplement (ENSURE ENLIVE)  237 mL Oral BID BM  . magnesium oxide  400 mg Oral BID  . pantoprazole  40 mg Oral  Daily  . predniSONE  40 mg Oral Q breakfast  . rosuvastatin  20 mg Oral q1800   Continuous Infusions: . sodium chloride 75 mL/hr at 10/22/17 0313  . lactated ringers 10 mL/hr at 10/21/17 1355  . methocarbamol (ROBAXIN)  IV       LOS: 3 days    Time spent: 25 minutes.     Hosie Poisson, MD [facility] Pager [phone]  If 7PM-7AM, please contact night-coverage [email or web address] Password [email or web address] 10/23/2017, 2:21 PM

## 2017-10-23 NOTE — Progress Notes (Signed)
RT placed patient on CPAP. Patient tolerating well at this time with no respiratory distress noted. RT will monitor as needed.

## 2017-10-23 NOTE — Progress Notes (Signed)
Physical Therapy Treatment Patient Details Name: Renee Hawkins MRN: [id number] DOB: [birth year] Today's Date: 10/23/2017    History of Present Illness Pt is a 77 y/o female s/p L THA secondary to sustaining a mechanical fall at home in which she fractured her L femoral neck. PMH including but not limited to COPD    PT Comments    Today's skilled session focused on HEP training and gait. Pt was able to tolerate increased ambulation distance with min A. Patient would benefit from continued skilled PT to maximize functional independence and safety with mobility. Will continue to follow acutely.    Follow Up Recommendations  SNF     Equipment Recommendations  None recommended by PT    Recommendations for Other Services       Precautions / Restrictions Precautions Precautions: Fall Restrictions Weight Bearing Restrictions: Yes LLE Weight Bearing: Weight bearing as tolerated    Mobility  Bed Mobility Overal bed mobility: Needs Assistance Bed Mobility: Sit to Supine       Sit to supine: Min assist   General bed mobility comments: min A to progress LLE on to bed.  Transfers Overall transfer level: Needs assistance Equipment used: Rolling walker (2 wheeled) Transfers: Sit to/from Stand Sit to Stand: Min guard         General transfer comment: increased time and effort, min guard for safety  Ambulation/Gait Ambulation/Gait assistance: Min assist Gait Distance (Feet): 75 Feet Assistive device: Rolling walker (2 wheeled) Gait Pattern/deviations: Decreased step length - right;Decreased step length - left;Decreased stride length;Step-to pattern;Step-through pattern;Decreased stance time - left;Decreased weight shift to left;Antalgic Gait velocity: decreased Gait velocity interpretation: <1.31 ft/sec, indicative of household ambulator General Gait Details: Pt initially with step to pattern. Able to progress to step through with cueing. Intermittant assist required for RW  management. Two standing rest breaks secondary to UE fatigue. One LOB, due to buckling on LLE. No assist required to stabalize.   Stairs             Wheelchair Mobility    Modified Rankin (Stroke Patients Only)       Balance Overall balance assessment: Needs assistance;History of Falls Sitting-balance support: Feet supported Sitting balance-Leahy Scale: Good     Standing balance support: During functional activity;Bilateral upper extremity supported Standing balance-Leahy Scale: Poor                              Cognition Arousal/Alertness: Awake/alert Behavior During Therapy: WFL for tasks assessed/performed Overall Cognitive Status: Within Functional Limits for tasks assessed                                        Exercises Total Joint Exercises Ankle Circles/Pumps: AROM;Both;10 reps;Seated Quad Sets: AROM;Left;10 reps;Seated Short Arc Quad: AROM;Left;10 reps;Seated Heel Slides: AROM;Left;10 reps;Seated Hip ABduction/ADduction: AROM;Left;10 reps;Seated    General Comments        Pertinent Vitals/Pain Pain Assessment: Faces Faces Pain Scale: Hurts little more Pain Location: L hip Pain Descriptors / Indicators: Sore Pain Intervention(s): Monitored during session;Limited activity within patient's tolerance;Repositioned    Home Living                      Prior Function            PT Goals (current goals can now be found in the care plan  section) Acute Rehab PT Goals Patient Stated Goal: decrease pain, return to independence PT Goal Formulation: With patient/family Time For Goal Achievement: 11/05/17 Potential to Achieve Goals: Good Progress towards PT goals: Progressing toward goals    Frequency    7X/week      PT Plan Current plan remains appropriate    Co-evaluation              AM-PAC PT "6 Clicks" Daily Activity  Outcome Measure  Difficulty turning over in bed (including adjusting  bedclothes, sheets and blankets)?: Unable Difficulty moving from lying on back to sitting on the side of the bed? : Unable Difficulty sitting down on and standing up from a chair with arms (e.g., wheelchair, bedside commode, etc,.)?: Unable Help needed moving to and from a bed to chair (including a wheelchair)?: A Little Help needed walking in hospital room?: A Little Help needed climbing 3-5 steps with a railing? : A Little 6 Click Score: 12    End of Session Equipment Utilized During Treatment: Gait belt Activity Tolerance: Patient tolerated treatment well Patient left: in bed;with call bell/phone within reach;with family/visitor present Nurse Communication: Mobility status PT Visit Diagnosis: Other abnormalities of gait and mobility (R26.89);Pain Pain - Right/Left: Left Pain - part of body: Hip     Time: 0092-3300 PT Time Calculation (min) (ACUTE ONLY): 35 min  Charges:  $Gait Training: 8-22 mins $Therapeutic Exercise: 8-22 mins                    G Codes:       Benjiman Core, [place] Pager [phone] Acute Rehab   Allena Katz 10/23/2017, 2:59 PM

## 2017-10-24 LAB — CBC
HEMATOCRIT: 29.7 % — AB (ref 36.0–46.0)
Hemoglobin: 9.4 g/dL — ABNORMAL LOW (ref 12.0–15.0)
MCH: 31.1 pg (ref 26.0–34.0)
MCHC: 31.6 g/dL (ref 30.0–36.0)
MCV: 98.3 fL (ref 78.0–100.0)
PLATELETS: 148 10*3/uL — AB (ref 150–400)
RBC: 3.02 MIL/uL — ABNORMAL LOW (ref 3.87–5.11)
RDW: 13.5 % (ref 11.5–15.5)
WBC: 7.3 10*3/uL (ref 4.0–10.5)

## 2017-10-24 NOTE — Progress Notes (Signed)
PROGRESS NOTE    ALERIA Hawkins  AOZ:[id number] DOB: 15-Jul-1940 DOA: 10/20/2017 PCP: Unk Pinto, MD    Brief Narrative: Renee Hawkins is a 77 y.o. female with medical history significant for COPD, now presenting to the emergency department for evaluation of left hip pain after a ground-level mechanical fall.  She was also found to have sob, hypoxic and wheezing, with productive cough with clear sputum. Chest x-ray is consistent with emphysema, but no acute finding.  Radiographs of the pelvis are notable for impacted fracture involving the left femoral neck.  CTA chest is negative for PE or acute finding, notable for emphysema and age-indeterminate T12 inferior endplate fracture, and stable nodules bilaterally.    Assessment & Plan:   Principal Problem:   Closed left hip fracture, initial encounter ([place]) Active Problems:   Acute respiratory failure with hypoxia (HCC)   COPD with acute exacerbation (HCC)   Acute respiratory failure with hypoxia secondary to Acute COPD exacerbation:  Admitted for IV steroids, duo nebs and use [place] oxygen as needed to keep sats greater than 90%.  Resume Brovana and pulmicort.  Resume doxycycline for bronchitis.  Weaned her off the oxygen. Tapered steroids. No wheezing heard today.  Prn tessalon.  Prn robitussin.  Closed left femoral neck fracture: Dr Marlou Sa consulted by EDP at [facility].  Transferred to [facility], for surgical repair. Underwent total hip arthroplasty and working with PT. Social work consult for SNF placement. Awaiting insurance authorization.  Pain control.  Possible dc in am.    Mild AKI:  Baseline creatinine is less than 1. On admission it was 1.08, back to baseline.     Hypertension:  Well controlled.   Hyperglycemia:   A1c was 5.4 in May  Ordered SSI.     GERD: stable.    Hyperlipidemia: resume crestor.   Constipation:  On stool softeners. Had BM today.     DVT prophylaxis: Lovenox.  Code Status: full code.    Family Communication: none at bedside.  Disposition Plan: discharge to SNF on Monday.    Consultants:   Orthopedics.    Procedures: None.   Antimicrobials:doxycyline.   Subjective: No chest pain or sob. No nausea, vomiting.   Objective: Vitals:   10/23/17 2357 10/24/17 0803 10/24/17 0805 10/24/17 1522  BP:    139/71  Pulse: 88   82  Resp: 16   16  Temp:    98.1 F (36.7 C)  TempSrc:    Oral  SpO2: 93% 93% 93% 96%  Weight:      Height:        Intake/Output Summary (Last 24 hours) at 10/24/2017 1555 Last data filed at 10/24/2017 0900 Gross per 24 hour  Intake 240 ml  Output -  Net 240 ml   Filed Weights   10/20/17 1537 10/21/17 1353  Weight: 50.8 kg (112 lb) 50.8 kg (112 lb)    Examination:  General exam:calm and comfortable on 2 lit of [place] oxygen  Respiratory system: clear to auscultation, no wheezing heard.  Cardiovascular system: S1 & S2 heard, RRR. No JVD, No pedal edema. Gastrointestinal system: Abdomen is soft NT ND BS+ Central nervous system: Alert and oriented. Non focal. Extremities: no pedal edema.  Skin: No rashes, lesions or ulcers Psychiatry:  Mood & affect appropriate.     Data Reviewed: I have personally reviewed following labs and imaging studies  CBC: Recent Labs  Lab 10/20/17 1554 10/21/17 2156 10/22/17 0418 10/23/17 0355 10/24/17 0552  WBC 8.5 15.0* 9.7 8.9  7.3  NEUTROABS 6.9  --   --   --   --   HGB 12.7 10.1* 9.3* 9.7* 9.4*  HCT 38.4 30.9* 29.3* 30.7* 29.7*  MCV 96.2 96.6 97.7 98.1 98.3  PLT 184 141* 132* 139* 938*   Basic Metabolic Panel: Recent Labs  Lab 10/20/17 1554 10/21/17 0033 10/21/17 2156 10/22/17 0418 10/23/17 0355  NA 137 140  --  141 142  K 3.9 3.7  --  4.2 3.5  CL 96* 101  --  109 105  CO2 28 25  --  27 30  GLUCOSE 102* 238*  --  126* 99  BUN 18 12  --  14 14  CREATININE 1.13* 1.18* 0.88 0.83 0.90  CALCIUM 9.4 9.1  --  7.9* 8.4*   GFR: Estimated Creatinine Clearance: 43.3 mL/min (by C-G formula  based on SCr of 0.9 mg/dL). Liver Function Tests: No results for input(s): AST, ALT, ALKPHOS, BILITOT, PROT, ALBUMIN in the last 168 hours. No results for input(s): LIPASE, AMYLASE in the last 168 hours. No results for input(s): AMMONIA in the last 168 hours. Coagulation Profile: Recent Labs  Lab 10/20/17 1644  INR 0.92   Cardiac Enzymes: Recent Labs  Lab 10/20/17 1554  TROPONINI <0.03   BNP (last 3 results) No results for input(s): PROBNP in the last 8760 hours. HbA1C: No results for input(s): HGBA1C in the last 72 hours. CBG: Recent Labs  Lab 10/21/17 1322  GLUCAP 121*   Lipid Profile: No results for input(s): CHOL, HDL, LDLCALC, TRIG, CHOLHDL, LDLDIRECT in the last 72 hours. Thyroid Function Tests: No results for input(s): TSH, T4TOTAL, FREET4, T3FREE, THYROIDAB in the last 72 hours. Anemia Panel: No results for input(s): VITAMINB12, FOLATE, FERRITIN, TIBC, IRON, RETICCTPCT in the last 72 hours. Sepsis Labs: No results for input(s): PROCALCITON, LATICACIDVEN in the last 168 hours.  Recent Results (from the past 240 hour(s))  Surgical pcr screen     Status: Abnormal   Collection Time: 10/21/17  1:07 AM  Result Value Ref Range Status   MRSA, PCR NEGATIVE NEGATIVE Final   Staphylococcus aureus POSITIVE (A) NEGATIVE Final    Comment: (NOTE) The Xpert SA Assay (FDA approved for NASAL specimens in patients 37 years of age and older), is one component of a comprehensive surveillance program. It is not intended to diagnose infection nor to guide or monitor treatment. Performed at [facility], [place] [address] [place], [place] [address]          Radiology Studies: No results found.      Scheduled Meds: . arformoterol  15 mcg Nebulization BID  . benzonatate  200 mg Oral TID  . budesonide (PULMICORT) nebulizer solution  0.25 mg Nebulization BID  . cholecalciferol  4,000 Units Oral Daily  . docusate sodium  100 mg Oral BID  . doxycycline  100 mg Oral  Q12H  . enoxaparin (LOVENOX) injection  40 mg Subcutaneous Q24H  . feeding supplement (ENSURE ENLIVE)  237 mL Oral BID BM  . magnesium oxide  400 mg Oral BID  . pantoprazole  40 mg Oral Daily  . predniSONE  40 mg Oral Q breakfast  . rosuvastatin  20 mg Oral q1800   Continuous Infusions: . sodium chloride 75 mL/hr at 10/22/17 0313  . lactated ringers 10 mL/hr at 10/21/17 1355  . methocarbamol (ROBAXIN)  IV       LOS: 4 days    Time spent: 25 minutes.     Hosie Poisson, MD [facility] Pager [phone]  If 7PM-7AM, please contact night-coverage [email or web address] Password [facility] 10/24/2017, 3:55 PM

## 2017-10-24 NOTE — Progress Notes (Signed)
Physical Therapy Treatment Patient Details Name: Renee Hawkins MRN: [id number] DOB: Sep 25, 1940 Today's Date: 10/24/2017    History of Present Illness Pt is a 77 y/o female s/p L THA secondary to sustaining a mechanical fall at home in which she fractured her L femoral neck. PMH including but not limited to COPD    PT Comments    Patient continues to progress, needing less assistance overall for exercises and mobility. She thinks she will "have a place to go" tomorrow.    Follow Up Recommendations  SNF     Equipment Recommendations  None recommended by PT    Recommendations for Other Services       Precautions / Restrictions Precautions Precautions: Fall Restrictions Weight Bearing Restrictions: Yes LLE Weight Bearing: Weight bearing as tolerated    Mobility  Bed Mobility Overal bed mobility: Needs Assistance Bed Mobility: Sit to Supine       Sit to supine: Min assist   General bed mobility comments: min A to progress LLE on to bed.  Transfers Overall transfer level: Needs assistance Equipment used: Rolling walker (2 wheeled) Transfers: Sit to/from Stand Sit to Stand: Min guard         General transfer comment: vc to use LLE as much as she can (she tends to pull into NWB or TDWB)  Ambulation/Gait Ambulation/Gait assistance: Min assist Gait Distance (Feet): 100 Feet Assistive device: Rolling walker (2 wheeled) Gait Pattern/deviations: Decreased step length - right;Decreased step length - left;Decreased stride length;Step-to pattern;Step-through pattern;Decreased stance time - left;Decreased weight shift to left;Antalgic Gait velocity: decreased Gait velocity interpretation: <1.8 ft/sec, indicate of risk for recurrent falls General Gait Details: Pt initially with step to pattern. Able to progress to step through with cueing. Assist required for RW management with attempts to "push like a grocery cart" with consecutive steps. Youth RW provided and pt reported  much less UE fatigue   Stairs             Wheelchair Mobility    Modified Rankin (Stroke Patients Only)       Balance Overall balance assessment: Needs assistance;History of Falls Sitting-balance support: Feet supported Sitting balance-Leahy Scale: Good     Standing balance support: During functional activity;Single extremity supported Standing balance-Leahy Scale: Poor                              Cognition Arousal/Alertness: Awake/alert Behavior During Therapy: WFL for tasks assessed/performed Overall Cognitive Status: Within Functional Limits for tasks assessed                                        Exercises Total Joint Exercises Ankle Circles/Pumps: AROM;Both;10 reps;Supine Quad Sets: AROM;Left;10 reps;Supine Short Arc Quad: AROM;Left;10 reps;Seated;AAROM Heel Slides: AROM;Left;10 reps;Seated;AAROM Hip ABduction/ADduction: AROM;Left;10 reps;Seated;AAROM    General Comments        Pertinent Vitals/Pain Pain Assessment: 0-10 Pain Score: 3  Pain Location: L hip Pain Descriptors / Indicators: Sore Pain Intervention(s): Limited activity within patient's tolerance;Monitored during session;Repositioned    Home Living                      Prior Function            PT Goals (current goals can now be found in the care plan section) Acute Rehab PT Goals Patient Stated Goal: decrease pain,  return to independence Time For Goal Achievement: 11/05/17 Progress towards PT goals: Progressing toward goals    Frequency    7X/week      PT Plan Current plan remains appropriate    Co-evaluation              AM-PAC PT "6 Clicks" Daily Activity  Outcome Measure  Difficulty turning over in bed (including adjusting bedclothes, sheets and blankets)?: Unable Difficulty moving from lying on back to sitting on the side of the bed? : A Lot Difficulty sitting down on and standing up from a chair with arms (e.g.,  wheelchair, bedside commode, etc,.)?: A Little Help needed moving to and from a bed to chair (including a wheelchair)?: A Little Help needed walking in hospital room?: A Little Help needed climbing 3-5 steps with a railing? : A Little 6 Click Score: 15    End of Session Equipment Utilized During Treatment: Gait belt Activity Tolerance: Patient tolerated treatment well Patient left: with call bell/phone within reach;with family/visitor present;in chair Nurse Communication: Mobility status PT Visit Diagnosis: Other abnormalities of gait and mobility (R26.89);Pain Pain - Right/Left: Left Pain - part of body: Hip     Time: 1587-2761 PT Time Calculation (min) (ACUTE ONLY): 43 min  Charges:  $Gait Training: 23-37 mins $Therapeutic Exercise: 8-22 mins                    G Codes:          [employer], PT 10/24/2017, 12:55 PM

## 2017-10-25 MED ORDER — BENZONATATE 200 MG PO CAPS: 200.0000 mg | ORAL_CAPSULE | Freq: Three times a day (TID) | ORAL | 0 refills | Status: DC

## 2017-10-25 MED ORDER — POLYETHYLENE GLYCOL 3350 17 G PO PACK: 17.0000 g | PACK | Freq: Every day | ORAL | 0 refills | Status: DC | PRN

## 2017-10-25 MED ORDER — PREDNISONE 20 MG PO TABS
20.0000 mg | ORAL_TABLET | Freq: Once | ORAL | Status: AC
  Administered 2017-10-25: 20 mg via ORAL
  Filled 2017-10-25: qty 1

## 2017-10-25 MED ORDER — DOCUSATE SODIUM 100 MG PO CAPS: 100.0000 mg | ORAL_CAPSULE | Freq: Two times a day (BID) | ORAL | 0 refills | Status: DC

## 2017-10-25 MED ORDER — ENSURE ENLIVE PO LIQD: 237.0000 mL | Freq: Two times a day (BID) | ORAL | 12 refills | Status: DC

## 2017-10-25 MED ORDER — IPRATROPIUM-ALBUTEROL 0.5-2.5 (3) MG/3ML IN SOLN: 3.0000 mL | Freq: Four times a day (QID) | RESPIRATORY_TRACT | 2 refills | Status: DC | PRN

## 2017-10-25 MED ORDER — BISACODYL 5 MG PO TBEC: 5.0000 mg | DELAYED_RELEASE_TABLET | Freq: Every day | ORAL | 0 refills | Status: DC | PRN

## 2017-10-25 NOTE — Clinical Social Work Note (Signed)
Clinical Social Worker facilitated patient discharge including contacting patient family and facility to confirm patient discharge plans.  Clinical information faxed to facility and family agreeable with plan.  CSW arranged ambulance transport via [facility] (1:30 PM) to [facility]--room 710 P.  RN to call [phone] for report prior to discharge.  Clinical Social Worker will sign off for now as social work intervention is no longer needed. Please consult [place] again if new need arises.  Loletha Grayer, MSW [phone]

## 2017-10-25 NOTE — Progress Notes (Signed)
Physical Therapy Treatment Patient Details Name: Renee Hawkins MRN: [id number] DOB: [birth year] Today's Date: 10/25/2017    History of Present Illness Pt is a 77 y/o female s/p L THA secondary to sustaining a mechanical fall at home in which she fractured her L femoral neck. PMH including but not limited to COPD    PT Comments    Pt performed gait training and functional mobility.  Pt limited due to DOE with SPO2 on RA dropping to 85% after toilet transfer.  Pt required 2-3L to improve O2 sats and placed on 3L for remainder of gait training.  With O2 applied she maintained 88%-91%.  Pt continues to require min guard to min assistance and continues to benefit from short term rehab at SNF.  Plan for transport later this pm.    Follow Up Recommendations  SNF     Equipment Recommendations  None recommended by PT    Recommendations for Other Services       Precautions / Restrictions Precautions Precautions: Fall Restrictions Weight Bearing Restrictions: Yes LLE Weight Bearing: Weight bearing as tolerated    Mobility  Bed Mobility               General bed mobility comments: Pt sitting on commode on arrival.   Transfers Overall transfer level: Needs assistance Equipment used: Rolling walker (2 wheeled) Transfers: Sit to/from Stand Sit to Stand: Min guard         General transfer comment: VCs for hand placement to and from seated surface.  Pt with poor eccentric loading when returning seated surface.    Ambulation/Gait Ambulation/Gait assistance: Min assist Gait Distance (Feet): 100 Feet Assistive device: Rolling walker (2 wheeled) Gait Pattern/deviations: Decreased step length - right;Decreased step length - left;Decreased stride length;Step-to pattern;Step-through pattern;Decreased stance time - left;Decreased weight shift to left;Antalgic Gait velocity: decreased   General Gait Details: Pt initially with step to pattern. Able to progress to step through with  cueing. Assist required for RW management to keep continuous step through pattern.     Stairs             Wheelchair Mobility    Modified Rankin (Stroke Patients Only)       Balance Overall balance assessment: Needs assistance;History of Falls   Sitting balance-Leahy Scale: Good       Standing balance-Leahy Scale: Poor                              Cognition Arousal/Alertness: Awake/alert Behavior During Therapy: WFL for tasks assessed/performed Overall Cognitive Status: Within Functional Limits for tasks assessed                                        Exercises      General Comments        Pertinent Vitals/Pain Pain Assessment: 0-10 Pain Score: 5  Pain Location: L hip Pain Descriptors / Indicators: Sore Pain Intervention(s): Monitored during session;Repositioned    Home Living                      Prior Function            PT Goals (current goals can now be found in the care plan section) Acute Rehab PT Goals Patient Stated Goal: decrease pain, return to independence Potential to Achieve Goals: Good Progress towards  PT goals: Progressing toward goals    Frequency    7X/week      PT Plan Current plan remains appropriate    Co-evaluation              AM-PAC PT "6 Clicks" Daily Activity  Outcome Measure  Difficulty turning over in bed (including adjusting bedclothes, sheets and blankets)?: Unable Difficulty moving from lying on back to sitting on the side of the bed? : A Lot Difficulty sitting down on and standing up from a chair with arms (e.g., wheelchair, bedside commode, etc,.)?: A Little Help needed moving to and from a bed to chair (including a wheelchair)?: A Little Help needed walking in hospital room?: A Little Help needed climbing 3-5 steps with a railing? : A Little 6 Click Score: 15    End of Session Equipment Utilized During Treatment: Gait belt Activity Tolerance: Patient  tolerated treatment well Patient left: with call bell/phone within reach;with family/visitor present;in chair Nurse Communication: Mobility status PT Visit Diagnosis: Other abnormalities of gait and mobility (R26.89);Pain Pain - Right/Left: Left Pain - part of body: Hip     Time: 8119-1478 PT Time Calculation (min) (ACUTE ONLY): 34 min  Charges:  $Gait Training: 8-22 mins $Therapeutic Activity: 8-22 mins                    G Codes:       Governor Rooks, PTA pager [phone]    Cristela Blue 10/25/2017, 12:31 PM

## 2017-10-25 NOTE — Progress Notes (Signed)
Written and verbal discharge instructions provided.  Report called to the Nurse Arminda Resides)  Patient is awaiting PTAR

## 2017-10-25 NOTE — Clinical Social Work Placement (Signed)
   CLINICAL SOCIAL WORK PLACEMENT  NOTE  Date:  10/25/2017  Patient Details  Name: Renee Hawkins MRN: [id number] Date of Birth: 1940-10-26  Clinical Social Work is seeking post-discharge placement for this patient at the   level of care (*CSW will initial, date and re-position this form in  chart as items are completed):      Patient/family provided with [facility] Work Department's list of facilities offering this level of care within the geographic area requested by the patient (or if unable, by the patient's family).  Yes   Patient/family informed of their freedom to choose among providers that offer the needed level of care, that participate in Medicare, Medicaid or managed care program needed by the patient, have an available bed and are willing to accept the patient.      Patient/family informed of [facility]'s ownership interest in [facility] and [facility], as well as of the fact that they are under no obligation to receive care at these facilities.  PASRR submitted to EDS on       PASRR number received on 10/22/17     Existing PASRR number confirmed on       FL2 transmitted to all facilities in geographic area requested by pt/family on 10/22/17     FL2 transmitted to all facilities within larger geographic area on       Patient informed that his/her managed care company has contracts with or will negotiate with certain facilities, including the following:        Yes   Patient/family informed of bed offers received.  Patient chooses bed at [facility]     Physician recommends and patient chooses bed at      [facility] to be transferred to [facility] on 10[facility]/17.  Patient to be transferred to facility by PTAR     Patient family notified on 10/25/17 of transfer.  Name of family member notified:  Pt and Horris Latino, pt's sister     PHYSICIAN Please prepare priority discharge summary, including medications, Please prepare prescriptions, Please  sign FL2     Additional Comment:    _______________________________________________ Eileen Stanford, LCSW 10/25/2017, 12:06 PM

## 2017-10-25 NOTE — Progress Notes (Signed)
Oxgyen levels dropped to 82% on 3L. Oxygen increased to  4L.

## 2017-10-25 NOTE — Clinical Social Work Note (Signed)
[employer] [facility]auf[facility] has been obtained, information was provided to [employer] at [employer]. Pt's sister will complete admission paperwork at facility--pt agreeable.   Loletha Grayer, MSW [phone]

## 2017-10-25 NOTE — Progress Notes (Addendum)
PTAR here to pick patient up. Upon arrival oxygen sats 68% on Room air. Placed on 2.5 L of oxygen. Sats 88-91%. Patient denies SOB. Lamar Blinks , NP made aware of patient's condition . Orders for discharge updated to 10/26/17. reevaluation in the morning. Patient Sister Horris Latino called and updated

## 2017-10-25 NOTE — Discharge Summary (Signed)
Physician Discharge Summary  Renee Hawkins LNL:[id number] DOB: May 31, 1940 DOA: 10/20/2017  PCP: Unk Pinto, MD  Admit date: 10/20/2017 Discharge date: 10/25/2017  Admitted From: home.  Disposition: SNF   Recommendations for Outpatient Follow-up:  1. Follow up with PCP in 1-2 weeks 2. Please obtain BMP/CBC in one week Please follow up with orthopedics as recommended.   Discharge Condition:stable.  CODE STATUS: FULL CODE.  Diet recommendation: Heart Healthy  Brief/Interim Summary: Renee Hawkins a 77 y.o.femalewith medical history significant forCOPD, now presenting to the emergency department for evaluation of left hip pain after a ground-level mechanical fall.  She was also found to have sob, hypoxic and wheezing, with productive cough with clear sputum. Chest x-ray is consistent with emphysema, but no acute finding. Radiographs of the pelvis are notable for impacted fracture involving the left femoral neck. CTA chest is negative for PE or acute finding, notable for emphysema and age-indeterminate T12 inferior endplate fracture, and stable nodules bilaterally.    Discharge Diagnoses:  Principal Problem:   Closed left hip fracture, initial encounter [facility]) Active Problems:   Acute respiratory failure with hypoxia ([place])   COPD with acute exacerbation ([facility])  Acute respiratory failure with hypoxia secondary to Acute COPD exacerbation:  Admitted for IV steroids, duo nebs and use [place] oxygen as needed to keep sats greater than 90%.  Completed 5 days of doxycycline.  Weaned her off the oxygen. Tapered steroids. No wheezing heard today.  Prn tessalon.  Prn robitussin.  Closed left femoral neck fracture: Dr Marlou Sa consulted by EDP at [facility].  Transferred to [facility], for surgical repair. Underwent total hip arthroplasty and working with PT. Social work consult for SNF placement. Awaiting insurance authorization.  Pain control.     Mild AKI:  Baseline creatinine is less  than 1. On admission it was 1.08, back to baseline.     Hypertension:  Well controlled.   Hyperglycemia:   A1c was 5.4 in May      GERD: stable.    Hyperlipidemia: resume crestor.   Constipation:  On stool softeners.      Discharge Instructions  Discharge Instructions    Diet - low sodium heart healthy   Complete by:  As directed    Discharge instructions   Complete by:  As directed    Follow up with orthopedics as recommended.   Weight bearing as tolerated   Complete by:  As directed      Allergies as of 10/25/2017   No Known Allergies     Medication List    STOP taking these medications   predniSONE 20 MG tablet Commonly known as:  DELTASONE     TAKE these medications   aspirin EC 81 MG tablet Take 81 mg by mouth daily.   benzonatate 200 MG capsule Commonly known as:  TESSALON Take 1 capsule (200 mg total) by mouth 3 (three) times daily.   bisacodyl 5 MG EC tablet Commonly known as:  DULCOLAX Take 1 tablet (5 mg total) by mouth daily as needed for moderate constipation.   Cholecalciferol 4000 units Caps Take 1 capsule by mouth 2 (two) times daily.   Coenzyme Q10 10 MG capsule Take 10 mg by mouth 2 (two) times daily.   docusate sodium 100 MG capsule Commonly known as:  COLACE Take 1 capsule (100 mg total) by mouth 2 (two) times daily.   enoxaparin 40 MG/0.4ML injection Commonly known as:  LOVENOX Inject 0.4 mLs (40 mg total) into the skin daily.  feeding supplement (ENSURE ENLIVE) Liqd Take 237 mLs by mouth 2 (two) times daily between meals.   gabapentin 300 MG capsule Commonly known as:  NEURONTIN TAKE 1 CAPSULE BY MOUTH 3 TO 4 TIMES DAILY FOR PAIN What changed:    how much to take  how to take this  when to take this  additional instructions   HYDROcodone-acetaminophen 5-325 MG tablet Commonly known as:  NORCO Take 1-2 tablets by mouth every 6 (six) hours as needed.   ipratropium-albuterol 0.5-2.5 (3) MG/3ML  Soln Commonly known as:  DUONEB Take 3 mLs by nebulization every 6 (six) hours as needed.   Magnesium 250 MG Tabs Take 250 mg by mouth daily.   omeprazole 40 MG capsule Commonly known as:  PRILOSEC TAKE ONE CAPSULE BY MOUTH ONCE DAILY FOR  ACID  REFLUX   polyethylene glycol packet Commonly known as:  MIRALAX / GLYCOLAX Take 17 g by mouth daily as needed for mild constipation.   potassium chloride 10 MEQ tablet Commonly known as:  K-DUR,KLOR-CON Take 10-20 mEq by mouth See admin instructions. 40mEq in the AM and 29mEq in the PM   rosuvastatin 40 MG tablet Commonly known as:  CRESTOR Take 1/2 to 1 tablet daily or as directed for Cholesterol   Tiotropium Bromide-Olodaterol 2.5-2.5 MCG/ACT Aers Commonly known as:  STIOLTO RESPIMAT Inhale 2 Act into the lungs daily.   Vinpocetine Powd Take 10 mcg by mouth daily. Capsule form   vitamin C 500 MG tablet Commonly known as:  ASCORBIC ACID Take 500 mg by mouth daily.            Discharge Care Instructions  (From admission, onward)        Start     Ordered   10/21/17 0000  Weight bearing as tolerated     10/21/17 1643     Follow-up Information    Leandrew Koyanagi, MD In 2 weeks.   Specialty:  Orthopedic Surgery Why:  For suture removal, For wound re-check Contact information: [place] [id number] [phone]        Unk Pinto, MD. Schedule an appointment as soon as possible for a visit in 1 week(s).   Specialty:  Internal Medicine Contact information: [address] [place] [address] [phone]          No Known Allergies  Consultations:  Orthopedics.    Procedures/Studies: Dg Chest 2 View  Result Date: 10/20/2017 CLINICAL DATA:  Shortness of breath today, history COPD, hypertension, former smoker, history of follicular lymphoma EXAM: CHEST - 2 VIEW COMPARISON:  11/10/2011 FINDINGS: Normal heart size, mediastinal contours, and pulmonary  vascularity. Atherosclerotic calcification aorta. Emphysematous changes without pulmonary infiltrate, pleural effusion or pneumothorax. Bones demineralized. IMPRESSION: Emphysematous changes without acute abnormalities. Electronically Signed   By: Lavonia Dana M.D.   On: 10/20/2017 16:46   Dg Scapula Left  Addendum Date: 10/12/2017   ADDENDUM REPORT: 10/12/2017 19:44 ADDENDUM: The superior aspect of the scapula is poorly delineated on oblique frontal projection. This may be normal related to projection however if the patient had persistent scapular discomfort, correlation with bone scan, CT or MR may be considered. Electronically Signed   By: Genia Del M.D.   On: 10/12/2017 19:44   Result Date: 10/12/2017 CLINICAL DATA:  77 year old female. Left scapular pain. History of lymphoma. Initial encounter. EXAM: LEFT SCAPULA - 2+ VIEWS COMPARISON:  None. FINDINGS: No fracture or dislocation. No bony destructive lesion Minimal acromioclavicular joint degenerative changes. Visualized lungs clear. Calcified  aorta. IMPRESSION: Minimal acromioclavicular joint degenerative changes. No plain film evidence of scapular destructive lesion. Aortic Atherosclerosis (ICD10-I70.0). Electronically Signed: By: Genia Del M.D. On: 10/12/2017 19:37   Dg Shoulder 1v Right  Result Date: 10/12/2017 CLINICAL DATA:  77 year old female with right humerus pain. History of lymphoma. Initial encounter. EXAM: RIGHT SHOULDER - 1 VIEW COMPARISON:  None. FINDINGS: Mild acromioclavicular joint degenerative changes. No fracture or dislocation. No bony destructive lesion. Right lung apical pleural thickening without associated bony destruction. IMPRESSION: Mild right acromioclavicular joint degenerative changes. No plain film evidence of osseous destructive lesion. Electronically Signed   By: Genia Del M.D.   On: 10/12/2017 19:40   Ct Angio Chest Pe W And/or Wo Contrast  Result Date: 10/20/2017 CLINICAL DATA:  Cough and shortness of  breath EXAM: CT ANGIOGRAPHY CHEST WITH CONTRAST TECHNIQUE: Multidetector CT imaging of the chest was performed using the standard protocol during bolus administration of intravenous contrast. Multiplanar CT image reconstructions and MIPs were obtained to evaluate the vascular anatomy. CONTRAST:  169mL ISOVUE-370 IOPAMIDOL (ISOVUE-370) INJECTION 76% COMPARISON:  Chest CT 05/26/2012 FINDINGS: Cardiovascular: --Pulmonary arteries: Contrast injection is sufficient to demonstrate satisfactory opacification of the pulmonary arteries to the segmental level. There is no pulmonary embolus. The main pulmonary artery is within normal limits for size. --Aorta: Satisfactory opacification of the thoracic aorta. No aortic dissection or other acute aortic syndrome. Normal three-vessel pattern. Multifocal atherosclerosis of the proximal left vertebral artery. The aortic course and caliber are normal. There is moderate aortic atherosclerosis. --Heart: Normal size. No pericardial effusion. Mediastinum/Nodes: No mediastinal, hilar or axillary lymphadenopathy. Small sliding hiatal hernia. Lungs/Pleura: Unchanged 6 mm left lower lobe pulmonary nodule. Multiple peripheral nodules in the right middle lobe. Apical predominant emphysema. Multiple peripheral left lower lobe nodules are also noted, unchanged. Upper Abdomen: Contrast bolus timing is not optimized for evaluation of the abdominal organs. Within this limitation, the visualized organs of the upper abdomen are normal. Musculoskeletal: There is fracture of the inferior endplate of Y60, age indeterminate. Review of the MIP images confirms the above findings. IMPRESSION: 1. No pulmonary embolus or acute aortic syndrome. 2. Aortic Atherosclerosis (ICD10-I70.0) and Emphysema (ICD10-J43.9). 3. T12 inferior endplate fracture, age indeterminate. Correlate for history of trauma. Thoracic spine MRI may be helpful for further temporal characterization. 4. Multiple bilateral pulmonary nodules,  measuring up to 6 mm, unchanged. Electronically Signed   By: Ulyses Jarred M.D.   On: 10/20/2017 19:51   Pelvis Portable  Result Date: 10/22/2017 CLINICAL DATA:  Left hip arthroplasty today. EXAM: PORTABLE PELVIS 1-2 VIEWS COMPARISON:  Intraoperative images from earlier on the same day. FINDINGS: Soft tissue emphysema from recent surgery adjacent to new left uncemented total hip arthroplasty. No hardware failure nor immediate postoperative complications. Single encircling cerclage wire is seen along the subtrochanteric portion of the left femur. IMPRESSION: Postop change status post recent left total hip arthroplasty without complicating features. Electronically Signed   By: Ashley Royalty M.D.   On: 10/22/2017 01:04   Dg Humerus Right  Result Date: 10/12/2017 CLINICAL DATA:  77 year old female with right humerus pain. History of lymphoma. Initial encounter. EXAM: RIGHT HUMERUS - 2+ VIEW COMPARISON:  Right shoulder single-view same date. FINDINGS: Mild right acromioclavicular joint degenerative changes. No fracture or dislocation. No osseous destructive lesion. IMPRESSION: Mild right acromioclavicular joint degenerative changes. No plain film evidence of osseous destructive lesion. Electronically Signed   By: Genia Del M.D.   On: 10/12/2017 19:42   Dg C-arm 1-60 Min  Result Date:  10/21/2017 CLINICAL DATA:  Total left hip arthroplasty for left femoral neck fracture. EXAM: DG C-ARM 61-120 MIN; OPERATIVE LEFT HIP WITH PELVIS COMPARISON:  Preoperative study from 10/20/2017 FINDINGS: 5 C-arm fluoroscopic views were acquired during placement of a left total uncemented hip arthroplasty. A total of 36.2 seconds of fluoroscopic time was utilized. Images demonstrate an uncemented left total hip arthroplasty in place of the previously noted impacted subcapital left femoral neck fracture. Single cerclage wire is seen traversing the subtrochanteric portion of the left femur. No immediate intraoperative complications  are identified. IMPRESSION: Fluoroscopic time utilized for left total hip arthroplasty. No immediate intraoperative complication identified on images provided. Electronically Signed   By: Ashley Royalty M.D.   On: 10/21/2017 18:25   Dg Hip Operative Unilat W Or W/o Pelvis Left  Result Date: 10/21/2017 CLINICAL DATA:  Total left hip arthroplasty for left femoral neck fracture. EXAM: DG C-ARM 61-120 MIN; OPERATIVE LEFT HIP WITH PELVIS COMPARISON:  Preoperative study from 10/20/2017 FINDINGS: 5 C-arm fluoroscopic views were acquired during placement of a left total uncemented hip arthroplasty. A total of 36.2 seconds of fluoroscopic time was utilized. Images demonstrate an uncemented left total hip arthroplasty in place of the previously noted impacted subcapital left femoral neck fracture. Single cerclage wire is seen traversing the subtrochanteric portion of the left femur. No immediate intraoperative complications are identified. IMPRESSION: Fluoroscopic time utilized for left total hip arthroplasty. No immediate intraoperative complication identified on images provided. Electronically Signed   By: Ashley Royalty M.D.   On: 10/21/2017 18:25   Dg Hip Unilat W Or Wo Pelvis 2-3 Views Left  Result Date: 10/20/2017 CLINICAL DATA:  Fall with left hip pain EXAM: DG HIP (WITH OR WITHOUT PELVIS) 2-3V LEFT COMPARISON:  None. FINDINGS: There is an impacted fracture of the left femoral neck. Femoral head remains approximated in the acetabulum. IMPRESSION: Impacted fracture of the left femoral neck. Electronically Signed   By: Ulyses Jarred M.D.   On: 10/20/2017 16:36       Subjective: No chest pain or sob, nausea, vomiting or abdominal pain.   Discharge Exam: Vitals:   10/24/17 2329 10/25/17 0357  BP:  (!) 150/70  Pulse: 80 77  Resp: 16 16  Temp:  98 F (36.7 C)  SpO2: 92% 93%   Vitals:   10/24/17 2144 10/24/17 2147 10/24/17 2329 10/25/17 0357  BP:    (!) 150/70  Pulse:   80 77  Resp:   16 16  Temp:     98 F (36.7 C)  TempSrc:    Oral  SpO2: (!) 88% 93% 92% 93%  Weight:      Height:        General: Pt is alert, awake, not in acute distress Cardiovascular: RRR, S1/S2 +, no rubs, no gallops Respiratory: CTA bilaterally, no wheezing, no rhonchi Abdominal: Soft, NT, ND, bowel sounds + Extremities: no edema, no cyanosis    The results of significant diagnostics from this hospitalization (including imaging, microbiology, ancillary and laboratory) are listed below for reference.     Microbiology: Recent Results (from the past 240 hour(s))  Surgical pcr screen     Status: Abnormal   Collection Time: 10/21/17  1:07 AM  Result Value Ref Range Status   MRSA, PCR NEGATIVE NEGATIVE Final   Staphylococcus aureus POSITIVE (A) NEGATIVE Final    Comment: (NOTE) The Xpert SA Assay (FDA approved for NASAL specimens in patients 36 years of age and older), is one component of a comprehensive  surveillance program. It is not intended to diagnose infection nor to guide or monitor treatment. Performed at [facility], [place] [address], [place], [place] [address]      Labs: BNP (last 3 results) Recent Labs    10/20/17 1554  BNP 27.7   Basic Metabolic Panel: Recent Labs  Lab 10/20/17 1554 10/21/17 0033 10/21/17 2156 10/22/17 0418 10/23/17 0355  NA 137 140  --  141 142  K 3.9 3.7  --  4.2 3.5  CL 96* 101  --  109 105  CO2 28 25  --  27 30  GLUCOSE 102* 238*  --  126* 99  BUN 18 12  --  14 14  CREATININE 1.13* 1.18* 0.88 0.83 0.90  CALCIUM 9.4 9.1  --  7.9* 8.4*   Liver Function Tests: No results for input(s): AST, ALT, ALKPHOS, BILITOT, PROT, ALBUMIN in the last 168 hours. No results for input(s): LIPASE, AMYLASE in the last 168 hours. No results for input(s): AMMONIA in the last 168 hours. CBC: Recent Labs  Lab 10/20/17 1554 10/21/17 2156 10/22/17 0418 10/23/17 0355 10/24/17 0552  WBC 8.5 15.0* 9.7 8.9 7.3  NEUTROABS 6.9  --   --   --   --   HGB 12.7 10.1* 9.3*  9.7* 9.4*  HCT 38.4 30.9* 29.3* 30.7* 29.7*  MCV 96.2 96.6 97.7 98.1 98.3  PLT 184 141* 132* 139* 148*   Cardiac Enzymes: Recent Labs  Lab 10/20/17 1554  TROPONINI <0.03   BNP: Invalid input(s): POCBNP CBG: Recent Labs  Lab 10/21/17 1322  GLUCAP 121*   D-Dimer No results for input(s): DDIMER in the last 72 hours. Hgb A1c No results for input(s): HGBA1C in the last 72 hours. Lipid Profile No results for input(s): CHOL, HDL, LDLCALC, TRIG, CHOLHDL, LDLDIRECT in the last 72 hours. Thyroid function studies No results for input(s): TSH, T4TOTAL, T3FREE, THYROIDAB in the last 72 hours.  Invalid input(s): FREET3 Anemia work up No results for input(s): VITAMINB12, FOLATE, FERRITIN, TIBC, IRON, RETICCTPCT in the last 72 hours. Urinalysis    Component Value Date/Time   COLORURINE YELLOW 02/22/2017 1502   APPEARANCEUR CLEAR 02/22/2017 1502   LABSPEC 1.010 02/22/2017 1502   PHURINE 6.5 02/22/2017 1502   GLUCOSEU NEGATIVE 02/22/2017 1502   HGBUR NEGATIVE 02/22/2017 1502   [place] 03/10/2016 1221   KETONESUR NEGATIVE 02/22/2017 1502   PROTEINUR NEGATIVE 02/22/2017 1502   UROBILINOGEN 0.2 11/27/2013 1453   NITRITE NEGATIVE 02/22/2017 1502   LEUKOCYTESUR TRACE (A) 02/22/2017 1502   Sepsis Labs Invalid input(s): PROCALCITONIN,  WBC,  LACTICIDVEN Microbiology Recent Results (from the past 240 hour(s))  Surgical pcr screen     Status: Abnormal   Collection Time: 10/21/17  1:07 AM  Result Value Ref Range Status   MRSA, PCR NEGATIVE NEGATIVE Final   Staphylococcus aureus POSITIVE (A) NEGATIVE Final    Comment: (NOTE) The Xpert SA Assay (FDA approved for NASAL specimens in patients 72 years of age and older), is one component of a comprehensive surveillance program. It is not intended to diagnose infection nor to guide or monitor treatment. Performed at [facility], [place] [address], [place], [place] [address]      Time coordinating discharge: 35   minutes  SIGNED:   Hosie Poisson, MD  [facility] 10/25/2017, 8:08 AM Pager   If 7PM-7AM, please contact night-coverage [email or web address] Password [email or web address]

## 2017-10-25 NOTE — Progress Notes (Signed)
[facility] was contacted regarding the delay in services today for the patient.  The patient was given an update on arrival time.  Apologies given on behalf of the [facility] delay.

## 2017-10-25 NOTE — Progress Notes (Signed)
RT called to assess patient. Patient placed on 100% NRB. Pulse ox showed 75% however patient showed no symptoms of hypoxia or respiratory distress. Portable pulse ox showed 100% on NRB. RT removed NRB and assessed patient on room air for 5 minutes, spo2 is 92%. RT placed patient on 2L [place]. Patient appears comfortable at this time. No respiratory distress noted. RT will return at 2300 to place patient on CPAP. RT will continue to monitor as needed.

## 2017-10-25 NOTE — Care Management Important Message (Signed)
Important Message  Patient Details  Name: Renee Hawkins MRN: [id number] Date of Birth: 1940-10-12   Medicare Important Message Given:  Yes    Orbie Pyo 10/25/2017, 4:12 PM

## 2017-10-25 NOTE — Progress Notes (Signed)
RT set up CPAP with new nasal mask and 3L O2 bled into circuit. Patient tolerating well at this time with spo2 93%. RT will monitor as needed.

## 2017-10-26 ENCOUNTER — Inpatient Hospital Stay (HOSPITAL_COMMUNITY): Payer: Medicare HMO

## 2017-10-26 DIAGNOSIS — J9601 Acute respiratory failure with hypoxia: Secondary | ICD-10-CM | POA: Diagnosis not present

## 2017-10-26 DIAGNOSIS — J439 Emphysema, unspecified: Secondary | ICD-10-CM | POA: Diagnosis not present

## 2017-10-26 DIAGNOSIS — K5900 Constipation, unspecified: Secondary | ICD-10-CM | POA: Diagnosis not present

## 2017-10-26 DIAGNOSIS — S72002D Fracture of unspecified part of neck of left femur, subsequent encounter for closed fracture with routine healing: Secondary | ICD-10-CM | POA: Diagnosis not present

## 2017-10-26 DIAGNOSIS — I700 Atherosclerosis of aorta: Secondary | ICD-10-CM | POA: Diagnosis not present

## 2017-10-26 DIAGNOSIS — E876 Hypokalemia: Secondary | ICD-10-CM | POA: Diagnosis not present

## 2017-10-26 DIAGNOSIS — E785 Hyperlipidemia, unspecified: Secondary | ICD-10-CM | POA: Diagnosis not present

## 2017-10-26 DIAGNOSIS — E559 Vitamin D deficiency, unspecified: Secondary | ICD-10-CM | POA: Diagnosis not present

## 2017-10-26 DIAGNOSIS — J449 Chronic obstructive pulmonary disease, unspecified: Secondary | ICD-10-CM | POA: Diagnosis not present

## 2017-10-26 DIAGNOSIS — M2550 Pain in unspecified joint: Secondary | ICD-10-CM | POA: Diagnosis not present

## 2017-10-26 DIAGNOSIS — F419 Anxiety disorder, unspecified: Secondary | ICD-10-CM | POA: Diagnosis not present

## 2017-10-26 DIAGNOSIS — R262 Difficulty in walking, not elsewhere classified: Secondary | ICD-10-CM | POA: Diagnosis not present

## 2017-10-26 DIAGNOSIS — Z7401 Bed confinement status: Secondary | ICD-10-CM | POA: Diagnosis not present

## 2017-10-26 DIAGNOSIS — I10 Essential (primary) hypertension: Secondary | ICD-10-CM | POA: Diagnosis not present

## 2017-10-26 DIAGNOSIS — W010XXD Fall on same level from slipping, tripping and stumbling without subsequent striking against object, subsequent encounter: Secondary | ICD-10-CM | POA: Diagnosis not present

## 2017-10-26 DIAGNOSIS — W19XXXA Unspecified fall, initial encounter: Secondary | ICD-10-CM | POA: Diagnosis not present

## 2017-10-26 DIAGNOSIS — K219 Gastro-esophageal reflux disease without esophagitis: Secondary | ICD-10-CM | POA: Diagnosis not present

## 2017-10-26 DIAGNOSIS — J441 Chronic obstructive pulmonary disease with (acute) exacerbation: Secondary | ICD-10-CM | POA: Diagnosis not present

## 2017-10-26 DIAGNOSIS — M25512 Pain in left shoulder: Secondary | ICD-10-CM | POA: Diagnosis not present

## 2017-10-26 DIAGNOSIS — M6281 Muscle weakness (generalized): Secondary | ICD-10-CM | POA: Diagnosis not present

## 2017-10-26 DIAGNOSIS — S72002A Fracture of unspecified part of neck of left femur, initial encounter for closed fracture: Secondary | ICD-10-CM | POA: Diagnosis not present

## 2017-10-26 NOTE — Discharge Summary (Signed)
Physician Discharge Summary  Renee Hawkins TKW:[id number] DOB: 09-13-1940 DOA: 10/20/2017  PCP: Unk Pinto, MD  Admit date: 10/20/2017 Discharge date: 10/26/2017  Admitted From: home.  Disposition: SNF   Recommendations for Outpatient Follow-up:  1. Follow up with PCP in 1-2 weeks 2. Please obtain BMP/CBC in one week Please follow up with orthopedics as recommended.  3. She will require 2 -3 lit of [place] oxygen on discharge.   Discharge Condition:stable.  CODE STATUS: FULL CODE.  Diet recommendation: Heart Healthy  Brief/Interim Summary: Renee Hawkins a 77 y.o.femalewith medical history significant forCOPD, now presenting to the emergency department for evaluation of left hip pain after a ground-level mechanical fall.  She was also found to have sob, hypoxic and wheezing, with productive cough with clear sputum. Chest x-ray is consistent with emphysema, but no acute finding. Radiographs of the pelvis are notable for impacted fracture involving the left femoral neck. CTA chest is negative for PE or acute finding, notable for emphysema and age-indeterminate T12 inferior endplate fracture, and stable nodules bilaterally.    Discharge Diagnoses:  Principal Problem:   Closed left hip fracture, initial encounter [facility]) Active Problems:   Acute respiratory failure with hypoxia ([place])   COPD with acute exacerbation ([facility])  Acute respiratory failure with hypoxia secondary to Acute COPD exacerbation:  Admitted for IV steroids, duo nebs and use [place] oxygen as needed to keep sats greater than 90%. She is currently requiring up to 2.5 lit of [place] oxygen to keep sats greater than 90%.  Completed 5 days of doxycycline.  Weaned her off the oxygen. Tapered steroids. No wheezing heard today.  Prn tessalon.  Prn robitussin.  Closed left femoral neck fracture: Dr Marlou Sa consulted by EDP at [facility].  Transferred to [facility] for surgical repair. Underwent total hip arthroplasty and working with Renee Hawkins.  Social work consult for SNF placement. Pain control.  Plan to discharge her today to SNF.     Mild AKI:  Baseline creatinine is less than 1. On admission it was 1.08, back to baseline.     Hypertension:  Well controlled.   Hyperglycemia:   A1c was 5.4 in May      GERD: stable.    Hyperlipidemia: resume crestor.   Constipation:  On stool softeners.      Discharge Instructions  Discharge Instructions    Diet - low sodium heart healthy   Complete by:  As directed    Diet - low sodium heart healthy   Complete by:  As directed    Discharge instructions   Complete by:  As directed    Follow up with orthopedics as recommended.   Weight bearing as tolerated   Complete by:  As directed      Allergies as of 10/26/2017   No Known Allergies     Medication List    STOP taking these medications   predniSONE 20 MG tablet Commonly known as:  DELTASONE     TAKE these medications   aspirin EC 81 MG tablet Take 81 mg by mouth daily.   benzonatate 200 MG capsule Commonly known as:  TESSALON Take 1 capsule (200 mg total) by mouth 3 (three) times daily.   bisacodyl 5 MG EC tablet Commonly known as:  DULCOLAX Take 1 tablet (5 mg total) by mouth daily as needed for moderate constipation.   Cholecalciferol 4000 units Caps Take 1 capsule by mouth 2 (two) times daily.   Coenzyme Q10 10 MG capsule Take 10 mg by mouth 2 (  two) times daily.   docusate sodium 100 MG capsule Commonly known as:  COLACE Take 1 capsule (100 mg total) by mouth 2 (two) times daily.   enoxaparin 40 MG/0.4ML injection Commonly known as:  LOVENOX Inject 0.4 mLs (40 mg total) into the skin daily.   feeding supplement (ENSURE ENLIVE) Liqd Take 237 mLs by mouth 2 (two) times daily between meals.   gabapentin 300 MG capsule Commonly known as:  NEURONTIN TAKE 1 CAPSULE BY MOUTH 3 TO 4 TIMES DAILY FOR PAIN What changed:    how much to take  how to take this  when to take  this  additional instructions   HYDROcodone-acetaminophen 5-325 MG tablet Commonly known as:  NORCO Take 1-2 tablets by mouth every 6 (six) hours as needed.   ipratropium-albuterol 0.5-2.5 (3) MG/3ML Soln Commonly known as:  DUONEB Take 3 mLs by nebulization every 6 (six) hours as needed.   Magnesium 250 MG Tabs Take 250 mg by mouth daily.   omeprazole 40 MG capsule Commonly known as:  PRILOSEC TAKE ONE CAPSULE BY MOUTH ONCE DAILY FOR  ACID  REFLUX   polyethylene glycol packet Commonly known as:  MIRALAX / GLYCOLAX Take 17 g by mouth daily as needed for mild constipation.   potassium chloride 10 MEQ tablet Commonly known as:  K-DUR,KLOR-CON Take 10-20 mEq by mouth See admin instructions. 47mEq in the AM and 17mEq in the PM   rosuvastatin 40 MG tablet Commonly known as:  CRESTOR Take 1/2 to 1 tablet daily or as directed for Cholesterol   Tiotropium Bromide-Olodaterol 2.5-2.5 MCG/ACT Aers Commonly known as:  STIOLTO RESPIMAT Inhale 2 Act into the lungs daily.   Vinpocetine Powd Take 10 mcg by mouth daily. Capsule form   vitamin C 500 MG tablet Commonly known as:  ASCORBIC ACID Take 500 mg by mouth daily.            Durable Medical Equipment  (From admission, onward)        Start     Ordered   10/26/17 1232  For home use only DME oxygen  Once    Question Answer Comment  Mode or (Route) Nasal cannula   Liters per Minute 2   Frequency Continuous (stationary and portable oxygen unit needed)   Oxygen conserving device Yes   Oxygen delivery system Gas      10/26/17 1231       Discharge Care Instructions  (From admission, onward)        Start     Ordered   10/21/17 0000  Weight bearing as tolerated     10/21/17 1643      Contact information for follow-up providers    Leandrew Koyanagi, MD In 2 weeks.   Specialty:  Orthopedic Surgery Why:  For suture removal, For wound re-check Contact information: [place]  [id number] [phone]        Unk Pinto, MD. Schedule an appointment as soon as possible for a visit in 1 week(s).   Specialty:  Internal Medicine Contact information: [address] [place] [address] [phone]            Contact information for after-discharge care    Destination    [facility] .   Service:  Skilled Nursing Contact information: [address] [place] [phone]                 No Known Allergies  Consultations:  Orthopedics.  Procedures/Studies: Dg Chest 2 View  Result Date: 10/26/2017 CLINICAL DATA:  Hypoxia EXAM: CHEST - 2 VIEW COMPARISON:  10/20/2017 FINDINGS: There is hyperinflation of the lungs compatible with COPD. Heart and mediastinal contours are within normal limits. No focal opacities or effusions. No acute bony abnormality. IMPRESSION: COPD.  No active disease. Electronically Signed   By: Rolm Baptise M.D.   On: 10/26/2017 10:36   Dg Chest 2 View  Result Date: 10/20/2017 CLINICAL DATA:  Shortness of breath today, history COPD, hypertension, former smoker, history of follicular lymphoma EXAM: CHEST - 2 VIEW COMPARISON:  11/10/2011 FINDINGS: Normal heart size, mediastinal contours, and pulmonary vascularity. Atherosclerotic calcification aorta. Emphysematous changes without pulmonary infiltrate, pleural effusion or pneumothorax. Bones demineralized. IMPRESSION: Emphysematous changes without acute abnormalities. Electronically Signed   By: Lavonia Dana M.D.   On: 10/20/2017 16:46   Dg Scapula Left  Addendum Date: 10/12/2017   ADDENDUM REPORT: 10/12/2017 19:44 ADDENDUM: The superior aspect of the scapula is poorly delineated on oblique frontal projection. This may be normal related to projection however if the patient had persistent scapular discomfort, correlation with bone scan, CT or MR may be considered. Electronically Signed   By: Genia Del M.D.   On: 10/12/2017  19:44   Result Date: 10/12/2017 CLINICAL DATA:  77 year old female. Left scapular pain. History of lymphoma. Initial encounter. EXAM: LEFT SCAPULA - 2+ VIEWS COMPARISON:  None. FINDINGS: No fracture or dislocation. No bony destructive lesion Minimal acromioclavicular joint degenerative changes. Visualized lungs clear. Calcified aorta. IMPRESSION: Minimal acromioclavicular joint degenerative changes. No plain film evidence of scapular destructive lesion. Aortic Atherosclerosis (ICD10-I70.0). Electronically Signed: By: Genia Del M.D. On: 10/12/2017 19:37   Dg Shoulder 1v Right  Result Date: 10/12/2017 CLINICAL DATA:  77 year old female with right humerus pain. History of lymphoma. Initial encounter. EXAM: RIGHT SHOULDER - 1 VIEW COMPARISON:  None. FINDINGS: Mild acromioclavicular joint degenerative changes. No fracture or dislocation. No bony destructive lesion. Right lung apical pleural thickening without associated bony destruction. IMPRESSION: Mild right acromioclavicular joint degenerative changes. No plain film evidence of osseous destructive lesion. Electronically Signed   By: Genia Del M.D.   On: 10/12/2017 19:40   Ct Angio Chest Pe W And/or Wo Contrast  Result Date: 10/20/2017 CLINICAL DATA:  Cough and shortness of breath EXAM: CT ANGIOGRAPHY CHEST WITH CONTRAST TECHNIQUE: Multidetector CT imaging of the chest was performed using the standard protocol during bolus administration of intravenous contrast. Multiplanar CT image reconstructions and MIPs were obtained to evaluate the vascular anatomy. CONTRAST:  117mL ISOVUE-370 IOPAMIDOL (ISOVUE-370) INJECTION 76% COMPARISON:  Chest CT 05/26/2012 FINDINGS: Cardiovascular: --Pulmonary arteries: Contrast injection is sufficient to demonstrate satisfactory opacification of the pulmonary arteries to the segmental level. There is no pulmonary embolus. The main pulmonary artery is within normal limits for size. --Aorta: Satisfactory opacification of the  thoracic aorta. No aortic dissection or other acute aortic syndrome. Normal three-vessel pattern. Multifocal atherosclerosis of the proximal left vertebral artery. The aortic course and caliber are normal. There is moderate aortic atherosclerosis. --Heart: Normal size. No pericardial effusion. Mediastinum/Nodes: No mediastinal, hilar or axillary lymphadenopathy. Small sliding hiatal hernia. Lungs/Pleura: Unchanged 6 mm left lower lobe pulmonary nodule. Multiple peripheral nodules in the right middle lobe. Apical predominant emphysema. Multiple peripheral left lower lobe nodules are also noted, unchanged. Upper Abdomen: Contrast bolus timing is not optimized for evaluation of the abdominal organs. Within this limitation, the visualized organs of the upper abdomen are normal. Musculoskeletal: There is fracture of the inferior endplate  of T12, age indeterminate. Review of the MIP images confirms the above findings. IMPRESSION: 1. No pulmonary embolus or acute aortic syndrome. 2. Aortic Atherosclerosis (ICD10-I70.0) and Emphysema (ICD10-J43.9). 3. T12 inferior endplate fracture, age indeterminate. Correlate for history of trauma. Thoracic spine MRI may be helpful for further temporal characterization. 4. Multiple bilateral pulmonary nodules, measuring up to 6 mm, unchanged. Electronically Signed   By: Ulyses Jarred M.D.   On: 10/20/2017 19:51   Pelvis Portable  Result Date: 10/22/2017 CLINICAL DATA:  Left hip arthroplasty today. EXAM: PORTABLE PELVIS 1-2 VIEWS COMPARISON:  Intraoperative images from earlier on the same day. FINDINGS: Soft tissue emphysema from recent surgery adjacent to new left uncemented total hip arthroplasty. No hardware failure nor immediate postoperative complications. Single encircling cerclage wire is seen along the subtrochanteric portion of the left femur. IMPRESSION: Postop change status post recent left total hip arthroplasty without complicating features. Electronically Signed   By:  Ashley Royalty M.D.   On: 10/22/2017 01:04   Dg Humerus Right  Result Date: 10/12/2017 CLINICAL DATA:  77 year old female with right humerus pain. History of lymphoma. Initial encounter. EXAM: RIGHT HUMERUS - 2+ VIEW COMPARISON:  Right shoulder single-view same date. FINDINGS: Mild right acromioclavicular joint degenerative changes. No fracture or dislocation. No osseous destructive lesion. IMPRESSION: Mild right acromioclavicular joint degenerative changes. No plain film evidence of osseous destructive lesion. Electronically Signed   By: Genia Del M.D.   On: 10/12/2017 19:42   Dg C-arm 1-60 Min  Result Date: 10/21/2017 CLINICAL DATA:  Total left hip arthroplasty for left femoral neck fracture. EXAM: DG C-ARM 61-120 MIN; OPERATIVE LEFT HIP WITH PELVIS COMPARISON:  Preoperative study from 10/20/2017 FINDINGS: 5 C-arm fluoroscopic views were acquired during placement of a left total uncemented hip arthroplasty. A total of 36.2 seconds of fluoroscopic time was utilized. Images demonstrate an uncemented left total hip arthroplasty in place of the previously noted impacted subcapital left femoral neck fracture. Single cerclage wire is seen traversing the subtrochanteric portion of the left femur. No immediate intraoperative complications are identified. IMPRESSION: Fluoroscopic time utilized for left total hip arthroplasty. No immediate intraoperative complication identified on images provided. Electronically Signed   By: Ashley Royalty M.D.   On: 10/21/2017 18:25   Dg Hip Operative Unilat W Or W/o Pelvis Left  Result Date: 10/21/2017 CLINICAL DATA:  Total left hip arthroplasty for left femoral neck fracture. EXAM: DG C-ARM 61-120 MIN; OPERATIVE LEFT HIP WITH PELVIS COMPARISON:  Preoperative study from 10/20/2017 FINDINGS: 5 C-arm fluoroscopic views were acquired during placement of a left total uncemented hip arthroplasty. A total of 36.2 seconds of fluoroscopic time was utilized. Images demonstrate an  uncemented left total hip arthroplasty in place of the previously noted impacted subcapital left femoral neck fracture. Single cerclage wire is seen traversing the subtrochanteric portion of the left femur. No immediate intraoperative complications are identified. IMPRESSION: Fluoroscopic time utilized for left total hip arthroplasty. No immediate intraoperative complication identified on images provided. Electronically Signed   By: Ashley Royalty M.D.   On: 10/21/2017 18:25   Dg Hip Unilat W Or Wo Pelvis 2-3 Views Left  Result Date: 10/20/2017 CLINICAL DATA:  Fall with left hip pain EXAM: DG HIP (WITH OR WITHOUT PELVIS) 2-3V LEFT COMPARISON:  None. FINDINGS: There is an impacted fracture of the left femoral neck. Femoral head remains approximated in the acetabulum. IMPRESSION: Impacted fracture of the left femoral neck. Electronically Signed   By: Ulyses Jarred M.D.   On: 10/20/2017 16:36  Subjective: No chest pain or sob, nausea, vomiting or abdominal pain.   Discharge Exam: Vitals:   10/26/17 0427 10/26/17 0928  BP: (!) 145/67   Pulse: 80   Resp: 17   Temp: 97.8 F (36.6 C)   SpO2: 95% 98%   Vitals:   10/25/17 2111 10/25/17 2313 10/26/17 0427 10/26/17 0928  BP: (!) 143/69  (!) 145/67   Pulse: 74 84 80   Resp: 15 16 17    Temp:   97.8 F (36.6 C)   TempSrc:   Oral   SpO2: (!) 84% 93% 95% 98%  Weight:      Height:        General: Renee Hawkins is alert, awake, not in acute distress Cardiovascular: RRR, S1/S2 +, no rubs, no gallops Respiratory: CTA bilaterally, no wheezing, no rhonchi Abdominal: Soft, NT, ND, bowel sounds + Extremities: no edema, no cyanosis    The results of significant diagnostics from this hospitalization (including imaging, microbiology, ancillary and laboratory) are listed below for reference.     Microbiology: Recent Results (from the past 240 hour(s))  Surgical pcr screen     Status: Abnormal   Collection Time: 10/21/17  1:07 AM  Result Value Ref Range  Status   MRSA, PCR NEGATIVE NEGATIVE Final   Staphylococcus aureus POSITIVE (A) NEGATIVE Final    Comment: (NOTE) The Xpert SA Assay (FDA approved for NASAL specimens in patients 68 years of age and older), is one component of a comprehensive surveillance program. It is not intended to diagnose infection nor to guide or monitor treatment. Performed at [facility], [place] [address], [place], [place] [address]      Labs: BNP (last 3 results) Recent Labs    10/20/17 1554  BNP 02.5   Basic Metabolic Panel: Recent Labs  Lab 10/20/17 1554 10/21/17 0033 10/21/17 2156 10/22/17 0418 10/23/17 0355  NA 137 140  --  141 142  K 3.9 3.7  --  4.2 3.5  CL 96* 101  --  109 105  CO2 28 25  --  27 30  GLUCOSE 102* 238*  --  126* 99  BUN 18 12  --  14 14  CREATININE 1.13* 1.18* 0.88 0.83 0.90  CALCIUM 9.4 9.1  --  7.9* 8.4*   Liver Function Tests: No results for input(s): AST, ALT, ALKPHOS, BILITOT, PROT, ALBUMIN in the last 168 hours. No results for input(s): LIPASE, AMYLASE in the last 168 hours. No results for input(s): AMMONIA in the last 168 hours. CBC: Recent Labs  Lab 10/20/17 1554 10/21/17 2156 10/22/17 0418 10/23/17 0355 10/24/17 0552  WBC 8.5 15.0* 9.7 8.9 7.3  NEUTROABS 6.9  --   --   --   --   HGB 12.7 10.1* 9.3* 9.7* 9.4*  HCT 38.4 30.9* 29.3* 30.7* 29.7*  MCV 96.2 96.6 97.7 98.1 98.3  PLT 184 141* 132* 139* 148*   Cardiac Enzymes: Recent Labs  Lab 10/20/17 1554  TROPONINI <0.03   BNP: Invalid input(s): POCBNP CBG: Recent Labs  Lab 10/21/17 1322  GLUCAP 121*   D-Dimer No results for input(s): DDIMER in the last 72 hours. Hgb A1c No results for input(s): HGBA1C in the last 72 hours. Lipid Profile No results for input(s): CHOL, HDL, LDLCALC, TRIG, CHOLHDL, LDLDIRECT in the last 72 hours. Thyroid function studies No results for input(s): TSH, T4TOTAL, T3FREE, THYROIDAB in the last 72 hours.  Invalid input(s): FREET3 Anemia work up No results  for input(s): VITAMINB12, FOLATE, FERRITIN, TIBC,  IRON, RETICCTPCT in the last 72 hours. Urinalysis    Component Value Date/Time   COLORURINE YELLOW 02/22/2017 1502   APPEARANCEUR CLEAR 02/22/2017 1502   LABSPEC 1.010 02/22/2017 1502   PHURINE 6.5 02/22/2017 1502   GLUCOSEU NEGATIVE 02/22/2017 1502   HGBUR NEGATIVE 02/22/2017 1502   [place] 03/10/2016 1221   KETONESUR NEGATIVE 02/22/2017 1502   PROTEINUR NEGATIVE 02/22/2017 1502   UROBILINOGEN 0.2 11/27/2013 1453   NITRITE NEGATIVE 02/22/2017 1502   LEUKOCYTESUR TRACE (A) 02/22/2017 1502   Sepsis Labs Invalid input(s): PROCALCITONIN,  WBC,  LACTICIDVEN Microbiology Recent Results (from the past 240 hour(s))  Surgical pcr screen     Status: Abnormal   Collection Time: 10/21/17  1:07 AM  Result Value Ref Range Status   MRSA, PCR NEGATIVE NEGATIVE Final   Staphylococcus aureus POSITIVE (A) NEGATIVE Final    Comment: (NOTE) The Xpert SA Assay (FDA approved for NASAL specimens in patients 38 years of age and older), is one component of a comprehensive surveillance program. It is not intended to diagnose infection nor to guide or monitor treatment. Performed at [facility], [place] [address] [place], [place] [address]      Time coordinating discharge: 35  minutes  SIGNED:   Hosie Poisson, MD  [facility] 10/26/2017, 1:28 PM Pager   If 7PM-7AM, please contact night-coverage [email or web address] Password [email or web address]

## 2017-10-26 NOTE — Progress Notes (Signed)
Patient discharging to [facility] today. Report called in to Nurse Aisha. Awaiting PTAR for Transportation.

## 2017-10-26 NOTE — Progress Notes (Signed)
PTAR here to transport patient. Horris Latino updated of patient transport to [facility]. No belongings sent with patient other than cell phone.

## 2017-10-26 NOTE — Progress Notes (Addendum)
Pt will DC to: [facility] DC date: 10/26/17 Family notified: Patient  Transport JY:LTEI  Please make sure signed prescription goes with the patient.  RN, patient, and facility notified of DC. Discharge Summary sent to facility. RN given number for report, [facility] at [phone] room [id number]. DC packet on chart. Ambulance transport requested for patient and informed the patient of anticipated discharge time.  CSW signing off.  Thurmond Butts, [place] Social Worker [phone]

## 2017-10-26 NOTE — Progress Notes (Signed)
Physical Therapy Treatment Patient Details Name: Renee Hawkins MRN: [id number] DOB: [birth year] Today's Date: 10/26/2017    History of Present Illness Pt is a 77 y/o female s/p L THA secondary to sustaining a mechanical fall at home in which she fractured her L femoral neck. PMH including but not limited to COPD    PT Comments    Pt performed gait training and reviewed part of HEP in seated and supine.  Pt remains limited due to SPO2 and required 3L with exertion to maintain 90%.  Pt presents with non productive cough.  Plan for SNF remains appropriate at this time until strength and function improves.  Pt is home alone and need to function at an independent level before returning home.    Follow Up Recommendations  SNF     Equipment Recommendations  None recommended by PT    Recommendations for Other Services       Precautions / Restrictions Precautions Precautions: Fall Restrictions Weight Bearing Restrictions: Yes LLE Weight Bearing: Weight bearing as tolerated    Mobility  Bed Mobility Overal bed mobility: Needs Assistance Bed Mobility: Supine to Sit     Supine to sit: Min assist;HOB elevated     General bed mobility comments: Pt required assistance to advance LLE to edge of bed  Transfers Overall transfer level: Needs assistance Equipment used: Rolling walker (2 wheeled) Transfers: Sit to/from Stand Sit to Stand: Min guard         General transfer comment: VCs for hand placement to and from seated surface.  Pt with poor eccentric loading when returning seated surface.    Ambulation/Gait Ambulation/Gait assistance: Min guard Gait Distance (Feet): 120 Feet Assistive device: Rolling walker (2 wheeled) Gait Pattern/deviations: Decreased step length - right;Decreased step length - left;Decreased stride length;Step-to pattern;Step-through pattern;Decreased stance time - left;Decreased weight shift to left;Antalgic;Trunk flexed Gait velocity: decreased    General Gait Details: Cues remain for RW safety, symmetry and postural awareness.     Stairs             Wheelchair Mobility    Modified Rankin (Stroke Patients Only)       Balance Overall balance assessment: Needs assistance;History of Falls Sitting-balance support: Feet supported Sitting balance-Leahy Scale: Good       Standing balance-Leahy Scale: Poor                              Cognition Arousal/Alertness: Awake/alert Behavior During Therapy: WFL for tasks assessed/performed Overall Cognitive Status: Within Functional Limits for tasks assessed                                        Exercises Total Joint Exercises Ankle Circles/Pumps: AROM;Both;10 reps;Supine Quad Sets: AROM;Left;10 reps;Supine Long Arc Quad: AROM;Strengthening;Left;10 reps;Seated    General Comments        Pertinent Vitals/Pain Pain Assessment: 0-10 Pain Score: 5  Pain Location: L hip Pain Descriptors / Indicators: Sore Pain Intervention(s): Monitored during session;Repositioned;Ice applied    Home Living                      Prior Function            PT Goals (current goals can now be found in the care plan section) Acute Rehab PT Goals Patient Stated Goal: decrease pain, return to independence PT  Goal Formulation: With patient/family Potential to Achieve Goals: Good Progress towards PT goals: Progressing toward goals    Frequency    7X/week      PT Plan Current plan remains appropriate    Co-evaluation              AM-PAC PT "6 Clicks" Daily Activity  Outcome Measure  Difficulty turning over in bed (including adjusting bedclothes, sheets and blankets)?: Unable Difficulty moving from lying on back to sitting on the side of the bed? : A Lot Difficulty sitting down on and standing up from a chair with arms (e.g., wheelchair, bedside commode, etc,.)?: A Little Help needed moving to and from a bed to chair (including a  wheelchair)?: A Little Help needed walking in hospital room?: A Little Help needed climbing 3-5 steps with a railing? : A Little 6 Click Score: 15    End of Session Equipment Utilized During Treatment: Gait belt Activity Tolerance: Patient tolerated treatment well Patient left: with call bell/phone within reach;with family/visitor present;in chair Nurse Communication: Mobility status PT Visit Diagnosis: Other abnormalities of gait and mobility (R26.89);Pain Pain - Right/Left: Left Pain - part of body: Hip     Time: 9379-0240 PT Time Calculation (min) (ACUTE ONLY): 20 min  Charges:  $Gait Training: 8-22 mins                    G Codes:       Governor Rooks, PTA pager [phone]    Cristela Blue 10/26/2017, 12:46 PM

## 2017-10-27 DIAGNOSIS — F419 Anxiety disorder, unspecified: Secondary | ICD-10-CM | POA: Diagnosis not present

## 2017-10-27 DIAGNOSIS — K219 Gastro-esophageal reflux disease without esophagitis: Secondary | ICD-10-CM | POA: Diagnosis not present

## 2017-10-27 DIAGNOSIS — J449 Chronic obstructive pulmonary disease, unspecified: Secondary | ICD-10-CM | POA: Diagnosis not present

## 2017-10-27 NOTE — Consult Note (Signed)
            [facility] CM Primary Care Navigator  10/27/2017  Renee Hawkins 25-Jun-1940 [id number]   Went to see patient at the bedside to identify possible discharge needs butshe wasalreadydischarge per staff.  Patientwas discharged yesterday to skilled nursing facility per therapy recommendation ([facility]).  Per chart review,patient presented to the emergency department for evaluation of left hip pain after a ground-level mechanical fall and was also found to have shortness of breath, hypoxia and wheezing, with productive cough (closed left hip fracture- status post left total hip arthroplasty; acute respiratory failure with hypoxia, COPD with acute exacerbation- requiring 2 -3 liters of oxygen per [name] on discharge)   Patient has discharge instruction to follow-up withprimary care provider in 1- 2 weeks.   For additional questions please contact:  Edwena Felty A. Barry Faircloth, BSN, RN-BC [facility] PRIMARY CARE Navigator Cell: [phone]

## 2017-10-28 DIAGNOSIS — J449 Chronic obstructive pulmonary disease, unspecified: Secondary | ICD-10-CM | POA: Diagnosis not present

## 2017-10-28 DIAGNOSIS — E876 Hypokalemia: Secondary | ICD-10-CM | POA: Diagnosis not present

## 2017-10-28 DIAGNOSIS — K219 Gastro-esophageal reflux disease without esophagitis: Secondary | ICD-10-CM | POA: Diagnosis not present

## 2017-10-28 DIAGNOSIS — E785 Hyperlipidemia, unspecified: Secondary | ICD-10-CM | POA: Diagnosis not present

## 2017-11-04 DIAGNOSIS — K219 Gastro-esophageal reflux disease without esophagitis: Secondary | ICD-10-CM | POA: Diagnosis not present

## 2017-11-04 DIAGNOSIS — K5900 Constipation, unspecified: Secondary | ICD-10-CM | POA: Diagnosis not present

## 2017-11-04 DIAGNOSIS — J449 Chronic obstructive pulmonary disease, unspecified: Secondary | ICD-10-CM | POA: Diagnosis not present

## 2017-11-05 ENCOUNTER — Encounter (INDEPENDENT_AMBULATORY_CARE_PROVIDER_SITE_OTHER): Payer: Self-pay | Admitting: Physician Assistant

## 2017-11-05 ENCOUNTER — Ambulatory Visit (INDEPENDENT_AMBULATORY_CARE_PROVIDER_SITE_OTHER): Payer: Medicare HMO | Admitting: Physician Assistant

## 2017-11-05 DIAGNOSIS — Z96652 Presence of left artificial knee joint: Secondary | ICD-10-CM | POA: Insufficient documentation

## 2017-11-05 HISTORY — DX: Presence of left artificial knee joint: Z96.652

## 2017-11-05 NOTE — Progress Notes (Signed)
Post-Op Visit Note   Patient: Renee Hawkins           Date of Birth: March 02, 1941           MRN: [id number] Visit Date: 11/05/2017 PCP: Unk Pinto, MD   Assessment & Plan:  Chief Complaint:  Chief Complaint  Patient presents with  . Left Hip - Routine Post Op, Wound Check   Visit Diagnoses:  1. H/O total knee replacement, left     Plan: Patient is a pleasant 77 year old female who presents to our clinic today 15 days status post left anterior approach total hip replacement, date of surgery 10/21/2017.  She has been doing well.  Residing at [facility] skilled nursing facility.  Minimal pain.  She has been improving with mobility as she has been working with physical therapy at the facility.  No fevers, chills or any other systemic symptoms.  Examination of her left hip reveals a well healing surgical incision with nylon sutures in place.  No evidence of cellulitis or infection.  Calf is soft and nontender.  She has near full hip flexion.  She is neurovascularly intact distally.  At this point, stitches were removed and Steri-Strips applied today.  She will continue with [facility].  She will follow-up with [place] in 4 weeks time for repeat evaluation and x-ray.  Follow-Up Instructions: Return in about 1 month (around 12/03/2017).   Orders:  No orders of the defined types were placed in this encounter.  No orders of the defined types were placed in this encounter.   Imaging: No results found.  PMFS History: Patient Active Problem List   Diagnosis Date Noted  . H/O total knee replacement, left 11/05/2017  . Acute respiratory failure with hypoxia ([place]) 10/20/2017  . Closed left hip fracture, initial encounter ([place]) 10/20/2017  . COPD with acute exacerbation ([place]) 10/20/2017  . Pain of right humerus 10/12/2017  . Pain of left scapula 10/12/2017  . Elevated BP without diagnosis of hypertension 08/13/2017  . Hypokalemia 05/28/2016  . Follicular  lymphoma grade iiia, lymph nodes of head, face, and neck ([place]) 02/11/2016  . Dyspnea 02/11/2016  . Encounter for antineoplastic chemotherapy 02/11/2016  . OSA and COPD overlap syndrome ([place]) 12/05/2014  . Osteoporosis 12/05/2014  . Medication management 03/06/2014  . CKD (chronic kidney disease) stage 2, GFR 60-89 ml/min 11/27/2013  . COPD (chronic obstructive pulmonary disease) with emphysema ([place]) 11/27/2013  . Hypertension   . Diverticulosis of colon with hemorrhage   . GERD    . Hx of adenomatous colonic polyps   . Hyperlipidemia   . Prediabetes   . Vitamin D deficiency   . RLS (restless legs syndrome)   . Vitamin B12 deficiency   . Anxiety    Past Medical History:  Diagnosis Date  . Anxiety   . COPD (chronic obstructive pulmonary disease) ([place])   . Diverticulosis   . GERD (gastroesophageal reflux disease)   . Hx of adenomatous colonic polyps   . Hyperlipidemia   . Hypertension   . Hypomagnesemia   . Neuropathy   . Peripheral neuropathy   . Prediabetes   . RLS (restless legs syndrome)   . Sleep apnea    CPAP  . Vitamin B12 deficiency   . Vitamin D deficiency     Family History  Problem Relation Age of Onset  . Stroke Mother   . Stroke Maternal Grandmother   . Colon cancer Neg Hx     Past  Surgical History:  Procedure Laterality Date  . APPENDECTOMY    . COLONOSCOPY N/A 05/18/2014   Procedure: COLONOSCOPY;  Surgeon: Lafayette Dragon, MD;  Location: [facility];  Service: Endoscopy;  Laterality: N/A;  . DIRECT LARYNGOSCOPY Right 02/04/2016   Procedure: DIRECT LARYNGOSCOPY;  Surgeon: Rozetta Nunnery, MD;  Location: [facility];  Service: ENT;  Laterality: Right;  . EYE SURGERY  2017   Bilateral cataract Dr. Bing Plume  . LYMPH NODE BIOPSY Right 02/04/2016   Procedure: EXCISIONAL BIOPSY RIGHT NECK LYMPH NODE;  Surgeon: Rozetta Nunnery, MD;  Location: [place];  Service: ENT;  Laterality: Right;  . TONSILLECTOMY    . TOTAL HIP  ARTHROPLASTY Left 10/21/2017   Procedure: TOTAL HIP ARTHROPLASTY ANTERIOR APPROACH;  Surgeon: Leandrew Koyanagi, MD;  Location: [place];  Service: Orthopedics;  Laterality: Left;   Social History   Occupational History  . Occupation: Retired     Fish farm manager: RETIRED  Tobacco Use  . Smoking status: Former Smoker    Packs/day: 1.00    Years: 30.00    Pack years: 30.00    Types: Cigarettes    Last attempt to quit: 04/06/2012    Years since quitting: 5.5  . Smokeless tobacco: Never Used  Substance and Sexual Activity  . Alcohol use: No  . Drug use: No  . Sexual activity: Not Currently

## 2017-11-09 ENCOUNTER — Telehealth (INDEPENDENT_AMBULATORY_CARE_PROVIDER_SITE_OTHER): Payer: Self-pay | Admitting: Orthopaedic Surgery

## 2017-11-09 NOTE — Telephone Encounter (Signed)
See message.

## 2017-11-09 NOTE — Telephone Encounter (Signed)
Recommend this for a total of 14 days after surgery.  Can be discontinued after that

## 2017-11-09 NOTE — Telephone Encounter (Signed)
Renee Hawkins  [phone]   Please discontinue Enoxaparin patient is complaining about the injection

## 2017-11-10 NOTE — Telephone Encounter (Signed)
Called and advised [place].

## 2017-11-11 DIAGNOSIS — E559 Vitamin D deficiency, unspecified: Secondary | ICD-10-CM | POA: Diagnosis not present

## 2017-11-11 DIAGNOSIS — J449 Chronic obstructive pulmonary disease, unspecified: Secondary | ICD-10-CM | POA: Diagnosis not present

## 2017-11-11 DIAGNOSIS — K219 Gastro-esophageal reflux disease without esophagitis: Secondary | ICD-10-CM | POA: Diagnosis not present

## 2017-11-13 DIAGNOSIS — I700 Atherosclerosis of aorta: Secondary | ICD-10-CM | POA: Diagnosis not present

## 2017-11-13 DIAGNOSIS — J9601 Acute respiratory failure with hypoxia: Secondary | ICD-10-CM | POA: Diagnosis not present

## 2017-11-13 DIAGNOSIS — J439 Emphysema, unspecified: Secondary | ICD-10-CM | POA: Diagnosis not present

## 2017-11-13 DIAGNOSIS — I10 Essential (primary) hypertension: Secondary | ICD-10-CM | POA: Diagnosis not present

## 2017-11-13 DIAGNOSIS — S72002D Fracture of unspecified part of neck of left femur, subsequent encounter for closed fracture with routine healing: Secondary | ICD-10-CM | POA: Diagnosis not present

## 2017-11-13 DIAGNOSIS — M25512 Pain in left shoulder: Secondary | ICD-10-CM | POA: Diagnosis not present

## 2017-11-15 DIAGNOSIS — I700 Atherosclerosis of aorta: Secondary | ICD-10-CM | POA: Diagnosis not present

## 2017-11-15 DIAGNOSIS — J439 Emphysema, unspecified: Secondary | ICD-10-CM | POA: Diagnosis not present

## 2017-11-15 DIAGNOSIS — J9601 Acute respiratory failure with hypoxia: Secondary | ICD-10-CM | POA: Diagnosis not present

## 2017-11-15 DIAGNOSIS — M25512 Pain in left shoulder: Secondary | ICD-10-CM | POA: Diagnosis not present

## 2017-11-15 DIAGNOSIS — I10 Essential (primary) hypertension: Secondary | ICD-10-CM | POA: Diagnosis not present

## 2017-11-15 DIAGNOSIS — S72002D Fracture of unspecified part of neck of left femur, subsequent encounter for closed fracture with routine healing: Secondary | ICD-10-CM | POA: Diagnosis not present

## 2017-11-17 DIAGNOSIS — J439 Emphysema, unspecified: Secondary | ICD-10-CM | POA: Diagnosis not present

## 2017-11-17 DIAGNOSIS — I700 Atherosclerosis of aorta: Secondary | ICD-10-CM | POA: Diagnosis not present

## 2017-11-17 DIAGNOSIS — S72002D Fracture of unspecified part of neck of left femur, subsequent encounter for closed fracture with routine healing: Secondary | ICD-10-CM | POA: Diagnosis not present

## 2017-11-17 DIAGNOSIS — J9601 Acute respiratory failure with hypoxia: Secondary | ICD-10-CM | POA: Diagnosis not present

## 2017-11-17 DIAGNOSIS — M25512 Pain in left shoulder: Secondary | ICD-10-CM | POA: Diagnosis not present

## 2017-11-17 DIAGNOSIS — I10 Essential (primary) hypertension: Secondary | ICD-10-CM | POA: Diagnosis not present

## 2017-11-19 DIAGNOSIS — I10 Essential (primary) hypertension: Secondary | ICD-10-CM | POA: Diagnosis not present

## 2017-11-19 DIAGNOSIS — J439 Emphysema, unspecified: Secondary | ICD-10-CM | POA: Diagnosis not present

## 2017-11-19 DIAGNOSIS — S72002D Fracture of unspecified part of neck of left femur, subsequent encounter for closed fracture with routine healing: Secondary | ICD-10-CM | POA: Diagnosis not present

## 2017-11-19 DIAGNOSIS — I700 Atherosclerosis of aorta: Secondary | ICD-10-CM | POA: Diagnosis not present

## 2017-11-19 DIAGNOSIS — M25512 Pain in left shoulder: Secondary | ICD-10-CM | POA: Diagnosis not present

## 2017-11-19 DIAGNOSIS — J9601 Acute respiratory failure with hypoxia: Secondary | ICD-10-CM | POA: Diagnosis not present

## 2017-11-22 DIAGNOSIS — I10 Essential (primary) hypertension: Secondary | ICD-10-CM | POA: Diagnosis not present

## 2017-11-22 DIAGNOSIS — I700 Atherosclerosis of aorta: Secondary | ICD-10-CM | POA: Diagnosis not present

## 2017-11-22 DIAGNOSIS — J9601 Acute respiratory failure with hypoxia: Secondary | ICD-10-CM | POA: Diagnosis not present

## 2017-11-22 DIAGNOSIS — M25512 Pain in left shoulder: Secondary | ICD-10-CM | POA: Diagnosis not present

## 2017-11-22 DIAGNOSIS — S72002D Fracture of unspecified part of neck of left femur, subsequent encounter for closed fracture with routine healing: Secondary | ICD-10-CM | POA: Diagnosis not present

## 2017-11-22 DIAGNOSIS — J439 Emphysema, unspecified: Secondary | ICD-10-CM | POA: Diagnosis not present

## 2017-11-23 ENCOUNTER — Ambulatory Visit: Payer: Self-pay | Admitting: Adult Health

## 2017-11-24 DIAGNOSIS — I700 Atherosclerosis of aorta: Secondary | ICD-10-CM | POA: Diagnosis not present

## 2017-11-24 DIAGNOSIS — J9601 Acute respiratory failure with hypoxia: Secondary | ICD-10-CM | POA: Diagnosis not present

## 2017-11-24 DIAGNOSIS — I10 Essential (primary) hypertension: Secondary | ICD-10-CM | POA: Diagnosis not present

## 2017-11-24 DIAGNOSIS — S72002D Fracture of unspecified part of neck of left femur, subsequent encounter for closed fracture with routine healing: Secondary | ICD-10-CM | POA: Diagnosis not present

## 2017-11-24 DIAGNOSIS — M25512 Pain in left shoulder: Secondary | ICD-10-CM | POA: Diagnosis not present

## 2017-11-24 DIAGNOSIS — J439 Emphysema, unspecified: Secondary | ICD-10-CM | POA: Diagnosis not present

## 2017-11-26 DIAGNOSIS — I700 Atherosclerosis of aorta: Secondary | ICD-10-CM | POA: Diagnosis not present

## 2017-11-26 DIAGNOSIS — J439 Emphysema, unspecified: Secondary | ICD-10-CM | POA: Diagnosis not present

## 2017-11-26 DIAGNOSIS — S72002D Fracture of unspecified part of neck of left femur, subsequent encounter for closed fracture with routine healing: Secondary | ICD-10-CM | POA: Diagnosis not present

## 2017-11-26 DIAGNOSIS — J9601 Acute respiratory failure with hypoxia: Secondary | ICD-10-CM | POA: Diagnosis not present

## 2017-11-26 DIAGNOSIS — M25512 Pain in left shoulder: Secondary | ICD-10-CM | POA: Diagnosis not present

## 2017-11-26 DIAGNOSIS — I10 Essential (primary) hypertension: Secondary | ICD-10-CM | POA: Diagnosis not present

## 2017-11-29 DIAGNOSIS — J9601 Acute respiratory failure with hypoxia: Secondary | ICD-10-CM | POA: Diagnosis not present

## 2017-11-29 DIAGNOSIS — I10 Essential (primary) hypertension: Secondary | ICD-10-CM | POA: Diagnosis not present

## 2017-11-29 DIAGNOSIS — I700 Atherosclerosis of aorta: Secondary | ICD-10-CM | POA: Diagnosis not present

## 2017-11-29 DIAGNOSIS — S72002D Fracture of unspecified part of neck of left femur, subsequent encounter for closed fracture with routine healing: Secondary | ICD-10-CM | POA: Diagnosis not present

## 2017-11-29 DIAGNOSIS — J439 Emphysema, unspecified: Secondary | ICD-10-CM | POA: Diagnosis not present

## 2017-11-29 DIAGNOSIS — M25512 Pain in left shoulder: Secondary | ICD-10-CM | POA: Diagnosis not present

## 2017-12-07 ENCOUNTER — Ambulatory Visit (INDEPENDENT_AMBULATORY_CARE_PROVIDER_SITE_OTHER): Payer: Medicare HMO

## 2017-12-07 ENCOUNTER — Ambulatory Visit (INDEPENDENT_AMBULATORY_CARE_PROVIDER_SITE_OTHER): Payer: Medicare HMO | Admitting: Orthopaedic Surgery

## 2017-12-07 ENCOUNTER — Encounter (INDEPENDENT_AMBULATORY_CARE_PROVIDER_SITE_OTHER): Payer: Self-pay | Admitting: Orthopaedic Surgery

## 2017-12-07 DIAGNOSIS — S72002A Fracture of unspecified part of neck of left femur, initial encounter for closed fracture: Secondary | ICD-10-CM

## 2017-12-07 NOTE — Progress Notes (Signed)
Post-Op Visit Note   Patient: Renee Hawkins           Date of Birth: 1940/11/01           MRN: [id number] Visit Date: 12/07/2017 PCP: Unk Pinto, MD   Assessment & Plan:  Chief Complaint:  Chief Complaint  Patient presents with  . Left Hip - Pain   Visit Diagnoses:  1. Left displaced femoral neck fracture ([place])     Plan: Dub Mikes is 6 weeks status post left total hip replacement for femoral neck fracture.  She is overall doing well.  She is ambulating without any assistive devices.  Surgical scar is fully healed.  Leg lengths are equal.  Painless rotation of the hip.  Overall she is doing very well.  She is not taking any pain medicines.  From my standpoint she is doing very well and progressing appropriately.  I think she is doing better than most hip fracture patients.  I want her to continue with home exercises for continued strengthening.  Recheck in 6 weeks with AP pelvis and lateral hip  Follow-Up Instructions: Return in about 6 weeks (around 01/18/2018).   Orders:  Orders Placed This Encounter  Procedures  . XR HIP UNILAT W OR W/O PELVIS 2-3 VIEWS LEFT   No orders of the defined types were placed in this encounter.   Imaging: Xr Hip Unilat W Or W/o Pelvis 2-3 Views Left  Result Date: 12/07/2017 Stable left total hip replacement in good alignment.  No evidence of subsidence of the stem.  No evidence of proximal femur fracture.   PMFS History: Patient Active Problem List   Diagnosis Date Noted  . H/O total knee replacement, left 11/05/2017  . Acute respiratory failure with hypoxia ([place]) 10/20/2017  . Closed left hip fracture, initial encounter ([place]) 10/20/2017  . COPD with acute exacerbation ([place]) 10/20/2017  . Pain of right humerus 10/12/2017  . Pain of left scapula 10/12/2017  . Elevated BP without diagnosis of hypertension 08/13/2017  . Hypokalemia 05/28/2016  . Follicular lymphoma grade iiia, lymph nodes of head, face, and neck ([place]) 02/11/2016  .  Dyspnea 02/11/2016  . Encounter for antineoplastic chemotherapy 02/11/2016  . OSA and COPD overlap syndrome ([place]) 12/05/2014  . Osteoporosis 12/05/2014  . Medication management 03/06/2014  . CKD (chronic kidney disease) stage 2, GFR 60-89 ml/min 11/27/2013  . COPD (chronic obstructive pulmonary disease) with emphysema ([place]) 11/27/2013  . Hypertension   . Diverticulosis of colon with hemorrhage   . GERD    . Hx of adenomatous colonic polyps   . Hyperlipidemia   . Prediabetes   . Vitamin D deficiency   . RLS (restless legs syndrome)   . Vitamin B12 deficiency   . Anxiety    Past Medical History:  Diagnosis Date  . Anxiety   . COPD (chronic obstructive pulmonary disease) ([place])   . Diverticulosis   . GERD (gastroesophageal reflux disease)   . Hx of adenomatous colonic polyps   . Hyperlipidemia   . Hypertension   . Hypomagnesemia   . Neuropathy   . Peripheral neuropathy   . Prediabetes   . RLS (restless legs syndrome)   . Sleep apnea    CPAP  . Vitamin B12 deficiency   . Vitamin D deficiency     Family History  Problem Relation Age of Onset  . Stroke Mother   . Stroke Maternal Grandmother   . Colon cancer Neg Hx     Past Surgical History:  Procedure Laterality Date  . APPENDECTOMY    . COLONOSCOPY N/A 05/18/2014   Procedure: COLONOSCOPY;  Surgeon: Lafayette Dragon, MD;  [facility];  Service: Endoscopy;  Laterality: N/A;  . DIRECT LARYNGOSCOPY Right 02/04/2016   Procedure: DIRECT LARYNGOSCOPY;  Surgeon: Rozetta Nunnery, MD;  Location: [place];  Service: ENT;  Laterality: Right;  . EYE SURGERY  2017   Bilateral cataract Dr. Bing Plume  . LYMPH NODE BIOPSY Right 02/04/2016   Procedure: EXCISIONAL BIOPSY RIGHT NECK LYMPH NODE;  Surgeon: Rozetta Nunnery, MD;  Location: [place];  Service: ENT;  Laterality: Right;  . TONSILLECTOMY    . TOTAL HIP ARTHROPLASTY Left 10/21/2017   Procedure: TOTAL HIP ARTHROPLASTY ANTERIOR  APPROACH;  Surgeon: Leandrew Koyanagi, MD;  Location: [place];  Service: Orthopedics;  Laterality: Left;   Social History   Occupational History  . Occupation: Retired     Fish farm manager: RETIRED  Tobacco Use  . Smoking status: Former Smoker    Packs/day: 1.00    Years: 30.00    Pack years: 30.00    Types: Cigarettes    Last attempt to quit: 04/06/2012    Years since quitting: 5.6  . Smokeless tobacco: Never Used  Substance and Sexual Activity  . Alcohol use: No  . Drug use: No  . Sexual activity: Not Currently

## 2017-12-13 ENCOUNTER — Inpatient Hospital Stay: Payer: Medicare HMO | Attending: Hematology and Oncology

## 2017-12-13 ENCOUNTER — Encounter: Payer: Self-pay | Admitting: Hematology and Oncology

## 2017-12-13 ENCOUNTER — Telehealth: Payer: Self-pay | Admitting: Hematology and Oncology

## 2017-12-13 ENCOUNTER — Inpatient Hospital Stay: Payer: Medicare HMO

## 2017-12-13 ENCOUNTER — Inpatient Hospital Stay (HOSPITAL_BASED_OUTPATIENT_CLINIC_OR_DEPARTMENT_OTHER): Payer: Medicare HMO | Admitting: Hematology and Oncology

## 2017-12-13 VITALS — BP 100/51 | HR 72 | Temp 97.9°F | Resp 18

## 2017-12-13 DIAGNOSIS — Z5112 Encounter for antineoplastic immunotherapy: Secondary | ICD-10-CM | POA: Insufficient documentation

## 2017-12-13 DIAGNOSIS — Z7982 Long term (current) use of aspirin: Secondary | ICD-10-CM | POA: Diagnosis not present

## 2017-12-13 DIAGNOSIS — Z79899 Other long term (current) drug therapy: Secondary | ICD-10-CM | POA: Insufficient documentation

## 2017-12-13 DIAGNOSIS — C8231 Follicular lymphoma grade IIIa, lymph nodes of head, face, and neck: Secondary | ICD-10-CM

## 2017-12-13 DIAGNOSIS — D638 Anemia in other chronic diseases classified elsewhere: Secondary | ICD-10-CM | POA: Insufficient documentation

## 2017-12-13 LAB — CBC WITH DIFFERENTIAL/PLATELET
BASOS ABS: 0 10*3/uL (ref 0.0–0.1)
Basophils Relative: 1 %
Eosinophils Absolute: 0.1 10*3/uL (ref 0.0–0.5)
Eosinophils Relative: 3 %
HCT: 34.4 % — ABNORMAL LOW (ref 34.8–46.6)
HEMOGLOBIN: 10.9 g/dL — AB (ref 11.6–15.9)
Lymphocytes Relative: 16 %
Lymphs Abs: 0.7 10*3/uL — ABNORMAL LOW (ref 0.9–3.3)
MCH: 29.5 pg (ref 25.1–34.0)
MCHC: 31.8 g/dL (ref 31.5–36.0)
MCV: 92.8 fL (ref 79.5–101.0)
Monocytes Absolute: 0.3 10*3/uL (ref 0.1–0.9)
Monocytes Relative: 7 %
NEUTROS PCT: 73 %
Neutro Abs: 3.2 10*3/uL (ref 1.5–6.5)
PLATELETS: 204 10*3/uL (ref 145–400)
RBC: 3.7 MIL/uL (ref 3.70–5.45)
RDW: 15.5 % — ABNORMAL HIGH (ref 11.2–14.5)
WBC: 4.3 10*3/uL (ref 3.9–10.3)

## 2017-12-13 LAB — COMPREHENSIVE METABOLIC PANEL
ALT: 9 U/L (ref 0–44)
ANION GAP: 12 (ref 5–15)
AST: 15 U/L (ref 15–41)
Albumin: 3.6 g/dL (ref 3.5–5.0)
Alkaline Phosphatase: 84 U/L (ref 38–126)
BUN: 12 mg/dL (ref 8–23)
CALCIUM: 9.3 mg/dL (ref 8.9–10.3)
CHLORIDE: 107 mmol/L (ref 98–111)
CO2: 28 mmol/L (ref 22–32)
CREATININE: 1.13 mg/dL — AB (ref 0.44–1.00)
GFR calc non Af Amer: 46 mL/min — ABNORMAL LOW (ref >60)
GFR, EST AFRICAN AMERICAN: 54 mL/min — AB (ref >60)
GLUCOSE: 92 mg/dL (ref 70–99)
Potassium: 3.3 mmol/L — ABNORMAL LOW (ref 3.5–5.1)
SODIUM: 147 mmol/L — AB (ref 135–145)
TOTAL PROTEIN: 6.6 g/dL (ref 6.5–8.1)
Total Bilirubin: 0.3 mg/dL (ref 0.3–1.2)

## 2017-12-13 MED ORDER — SODIUM CHLORIDE 0.9 % IV SOLN
Freq: Once | INTRAVENOUS | Status: AC
  Administered 2017-12-13: 10:00:00 via INTRAVENOUS
  Filled 2017-12-13: qty 250

## 2017-12-13 MED ORDER — FAMOTIDINE IN NACL 20-0.9 MG/50ML-% IV SOLN
20.0000 mg | Freq: Two times a day (BID) | INTRAVENOUS | Status: DC
  Administered 2017-12-13: 20 mg via INTRAVENOUS

## 2017-12-13 MED ORDER — SODIUM CHLORIDE 0.9 % IV SOLN
375.0000 mg/m2 | Freq: Once | INTRAVENOUS | Status: AC
  Administered 2017-12-13: 600 mg via INTRAVENOUS
  Filled 2017-12-13: qty 50

## 2017-12-13 MED ORDER — ACETAMINOPHEN 325 MG PO TABS
ORAL_TABLET | ORAL | Status: AC
  Filled 2017-12-13: qty 2

## 2017-12-13 MED ORDER — FAMOTIDINE IN NACL 20-0.9 MG/50ML-% IV SOLN
INTRAVENOUS | Status: AC
  Filled 2017-12-13: qty 50

## 2017-12-13 MED ORDER — ACETAMINOPHEN 325 MG PO TABS
650.0000 mg | ORAL_TABLET | Freq: Once | ORAL | Status: AC
  Administered 2017-12-13: 650 mg via ORAL

## 2017-12-13 NOTE — Patient Instructions (Signed)
[facility] Discharge Instructions for Patients Receiving Chemotherapy  Today you received the following chemotherapy agent:  Rituxan  To help prevent nausea and vomiting after your treatment, we encourage you to take your nausea medication as prescribed.   If you develop nausea and vomiting that is not controlled by your nausea medication, call the clinic.   BELOW ARE SYMPTOMS THAT SHOULD BE REPORTED IMMEDIATELY:  *FEVER GREATER THAN 100.5 F  *CHILLS WITH OR WITHOUT FEVER  NAUSEA AND VOMITING THAT IS NOT CONTROLLED WITH YOUR NAUSEA MEDICATION  *UNUSUAL SHORTNESS OF BREATH  *UNUSUAL BRUISING OR BLEEDING  TENDERNESS IN MOUTH AND THROAT WITH OR WITHOUT PRESENCE OF ULCERS  *URINARY PROBLEMS  *BOWEL PROBLEMS  UNUSUAL RASH Items with * indicate a potential emergency and should be followed up as soon as possible.  Feel free to call the clinic should you have any questions or concerns. The clinic phone number is [phone].  Please show the CHEMO ALERT CARD at check-in to the Emergency Department and triage nurse.   

## 2017-12-13 NOTE — Progress Notes (Signed)
OK to treat today with abnormal labs per MD Alvy Bimler

## 2017-12-13 NOTE — Assessment & Plan Note (Signed)
She has anemia chronic disease likely is still in the recovery phase from her recent hip fracture It is overall improving She is not symptomatic Observe only

## 2017-12-13 NOTE — Assessment & Plan Note (Addendum)
Her last PET/CT scan in February 2018 showed complete response to treatment. We will proceed with consolidation treatment with maintenance rituximab every 60 days for 2 years, last dose January 2020 Due to no signs of disease, I would defer imaging study until later in the year. She has no signs or symptoms of cancer recurrence We will proceed with treatment today without delay I recommend influenza vaccination when it is available.

## 2017-12-13 NOTE — Progress Notes (Signed)
[place] OFFICE PROGRESS NOTE  Patient Care Team: Unk Pinto, MD as PCP - General (Internal Medicine) Lafayette Dragon, MD (Inactive) as Consulting Physician (Gastroenterology) Loletha Carrow, MD (Pulmonary Disease) Calvert Cantor, MD as Consulting Physician (Ophthalmology) Heath Lark, MD as Consulting Physician (Hematology and Oncology) Rozetta Nunnery, MD as Consulting Physician (Otolaryngology)  ASSESSMENT & PLAN:  Follicular lymphoma grade iiia, lymph nodes of head, face, and neck [facility]) Her last PET/CT scan in February 2018 showed complete response to treatment. We will proceed with consolidation treatment with maintenance rituximab every 60 days for 2 years, last dose January 2020 Due to no signs of disease, I would defer imaging study until later in the year. She has no signs or symptoms of cancer recurrence We will proceed with treatment today without delay I recommend influenza vaccination when it is available.  Anemia, chronic disease She has anemia chronic disease likely is still in the recovery phase from her recent hip fracture It is overall improving She is not symptomatic Observe only   No orders of the defined types were placed in this encounter.   INTERVAL HISTORY: Please see below for problem oriented charting. She returns for cycle #10 Since her last time I saw her, she had a hip fracture requiring surgery She is mobilizing well now.  She denies pain.  Her previous shoulder pain has resolved. No new lymphadenopathy or recent infection  SUMMARY OF ONCOLOGIC HISTORY:   Follicular lymphoma grade iiia, lymph nodes of head, face, and neck ([place])   01/29/2016 Imaging    Ct neck showed right submandibular lymphadenopathy and asymmetric prominence of jugular chain nodes. Metastatic disease is the primary diagnostic concern and submandibular node sampling is recommended. No primary lesion is seen, limited at the oral cavity due to dental  artifact. 2. Emphysema    02/04/2016 Pathology Results    Accession: [id number] Biopsy showed high grade follicular lymphoma    25/95/6387 PET scan    Hypermetabolic right submandibular and right level II/III lymph nodes, consistent with the given history of lymphoma. No hypermetabolic lymph nodes in the chest, abdomen or pelvis. Aortic atherosclerosis (ICD10-170.0). Coronary artery calcification. Emphysema (ICD10-J43.9).    02/24/2016 Imaging    ECHO showed LV The cavity size was normal. Systolic function was   normal. The estimated ejection fraction was in the range of 55% to 60%. Wall motion was normal; there were no regional wall motion abnormalities. Doppler parameters are consistent with abnormal left ventricular relaxation (grade 1 diastolic dysfunction).    03/13/2016 - 04/24/2016 Chemotherapy    The patient had R-CHOP chemo x 3 cycles    05/27/2016 PET scan    No metabolic activity associated with the RIGHT submandibular gland or RIGHT cervical lymph nodes consistent with a complete response. 2. Bilateral pulmonary nodules are not changed from comparison exam. 3. Diffuse mild esophagus metabolic activity suggests mild esophagitis.     REVIEW OF SYSTEMS:   Constitutional: Denies fevers, chills or abnormal weight loss Eyes: Denies blurriness of vision Ears, nose, mouth, throat, and face: Denies mucositis or sore throat Respiratory: Denies cough, dyspnea or wheezes Cardiovascular: Denies palpitation, chest discomfort or lower extremity swelling Gastrointestinal:  Denies nausea, heartburn or change in bowel habits Skin: Denies abnormal skin rashes Lymphatics: Denies new lymphadenopathy or easy bruising Neurological:Denies numbness, tingling or new weaknesses Behavioral/Psych: Mood is stable, no new changes  All other systems were reviewed with the patient and are negative.  I have reviewed the past medical history, past surgical  history, social history and family history with the  patient and they are unchanged from previous note.  ALLERGIES:  has No Known Allergies.  MEDICATIONS:  Current Outpatient Medications  Medication Sig Dispense Refill  . aspirin EC 81 MG tablet Take 81 mg by mouth daily.    . benzonatate (TESSALON) 200 MG capsule Take 1 capsule (200 mg total) by mouth 3 (three) times daily. 20 capsule 0  . bisacodyl (DULCOLAX) 5 MG EC tablet Take 1 tablet (5 mg total) by mouth daily as needed for moderate constipation. 30 tablet 0  . Cholecalciferol 4000 UNITS CAPS Take 1 capsule by mouth 2 (two) times daily.     . Coenzyme Q10 10 MG capsule Take 10 mg by mouth 2 (two) times daily.    Marland Kitchen docusate sodium (COLACE) 100 MG capsule Take 1 capsule (100 mg total) by mouth 2 (two) times daily. 10 capsule 0  . enoxaparin (LOVENOX) 40 MG/0.4ML injection Inject 0.4 mLs (40 mg total) into the skin daily. 14 Syringe 0  . feeding supplement, ENSURE ENLIVE, (ENSURE ENLIVE) LIQD Take 237 mLs by mouth 2 (two) times daily between meals. 237 mL 12  . gabapentin (NEURONTIN) 300 MG capsule TAKE 1 CAPSULE BY MOUTH 3 TO 4 TIMES DAILY FOR PAIN (Patient taking differently: Take 600 mg by mouth 2 (two) times daily. ) 360 capsule 1  . HYDROcodone-acetaminophen (NORCO) 5-325 MG tablet Take 1-2 tablets by mouth every 6 (six) hours as needed. 30 tablet 0  . ipratropium-albuterol (DUONEB) 0.5-2.5 (3) MG/3ML SOLN Take 3 mLs by nebulization every 6 (six) hours as needed. 360 mL 2  . Magnesium 250 MG TABS Take 250 mg by mouth daily.     Marland Kitchen omeprazole (PRILOSEC) 40 MG capsule TAKE ONE CAPSULE BY MOUTH ONCE DAILY FOR  ACID  REFLUX 90 capsule 1  . polyethylene glycol (MIRALAX / GLYCOLAX) packet Take 17 g by mouth daily as needed for mild constipation. 14 each 0  . potassium chloride (K-DUR,KLOR-CON) 10 MEQ tablet Take 10-20 mEq by mouth See admin instructions. 91mEq in the AM and 55mEq in the PM    . rosuvastatin (CRESTOR) 40 MG tablet Take 1/2 to 1 tablet daily or as directed for Cholesterol 30  tablet 5  . Tiotropium Bromide-Olodaterol (STIOLTO RESPIMAT) 2.5-2.5 MCG/ACT AERS Inhale 2 Act into the lungs daily. 12 g 4  . Vinpocetine POWD Take 10 mcg by mouth daily. Capsule form    . vitamin C (ASCORBIC ACID) 500 MG tablet Take 500 mg by mouth daily.     No current facility-administered medications for this visit.     PHYSICAL EXAMINATION: ECOG PERFORMANCE STATUS: 1 - Symptomatic but completely ambulatory  Vitals:   12/13/17 0831  BP: (!) 126/59  Pulse: 93  Resp: 17  Temp: 98.6 F (37 C)  SpO2: 93%   Filed Weights   12/13/17 0831  Weight: 106 lb 6.4 oz (48.3 kg)    GENERAL:alert, no distress and comfortable SKIN: skin color, texture, turgor are normal, no rashes or significant lesions EYES: normal, Conjunctiva are pink and non-injected, sclera clear OROPHARYNX:no exudate, no erythema and lips, buccal mucosa, and tongue normal  NECK: supple, thyroid normal size, non-tender, without nodularity LYMPH:  no palpable lymphadenopathy in the cervical, axillary or inguinal LUNGS: clear to auscultation and percussion with normal breathing effort HEART: regular rate & rhythm and no murmurs and no lower extremity edema ABDOMEN:abdomen soft, non-tender and normal bowel sounds Musculoskeletal:no cyanosis of digits and no clubbing  NEURO: alert &  oriented x 3 with fluent speech, no focal motor/sensory deficits  LABORATORY DATA:  I have reviewed the data as listed    Component Value Date/Time   NA 142 10/23/2017 0355   NA 142 02/12/2017 0809   K 3.5 10/23/2017 0355   K 4.0 02/12/2017 0809   CL 105 10/23/2017 0355   CO2 30 10/23/2017 0355   CO2 28 02/12/2017 0809   GLUCOSE 99 10/23/2017 0355   GLUCOSE 97 02/12/2017 0809   BUN 14 10/23/2017 0355   BUN 11.2 02/12/2017 0809   CREATININE 0.90 10/23/2017 0355   CREATININE 0.97 (H) 08/18/2017 1451   CREATININE 0.9 02/12/2017 0809   CALCIUM 8.4 (L) 10/23/2017 0355   CALCIUM 9.3 02/12/2017 0809   PROT 6.6 10/12/2017 0812    PROT 6.7 02/12/2017 0809   ALBUMIN 4.1 10/12/2017 0812   ALBUMIN 3.8 02/12/2017 0809   AST 17 10/12/2017 0812   AST 16 02/12/2017 0809   ALT 16 10/12/2017 0812   ALT 15 02/12/2017 0809   ALKPHOS 67 10/12/2017 0812   ALKPHOS 88 02/12/2017 0809   BILITOT 0.4 10/12/2017 0812   BILITOT 0.40 02/12/2017 0809   GFRNONAA >60 10/23/2017 0355   GFRNONAA 57 (L) 08/18/2017 1451   GFRAA >60 10/23/2017 0355   GFRAA 66 08/18/2017 1451    No results found for: SPEP, UPEP  Lab Results  Component Value Date   WBC 4.3 12/13/2017   NEUTROABS 3.2 12/13/2017   HGB 10.9 (L) 12/13/2017   HCT 34.4 (L) 12/13/2017   MCV 92.8 12/13/2017   PLT 204 12/13/2017      Chemistry      Component Value Date/Time   NA 142 10/23/2017 0355   NA 142 02/12/2017 0809   K 3.5 10/23/2017 0355   K 4.0 02/12/2017 0809   CL 105 10/23/2017 0355   CO2 30 10/23/2017 0355   CO2 28 02/12/2017 0809   BUN 14 10/23/2017 0355   BUN 11.2 02/12/2017 0809   CREATININE 0.90 10/23/2017 0355   CREATININE 0.97 (H) 08/18/2017 1451   CREATININE 0.9 02/12/2017 0809      Component Value Date/Time   CALCIUM 8.4 (L) 10/23/2017 0355   CALCIUM 9.3 02/12/2017 0809   ALKPHOS 67 10/12/2017 0812   ALKPHOS 88 02/12/2017 0809   AST 17 10/12/2017 0812   AST 16 02/12/2017 0809   ALT 16 10/12/2017 0812   ALT 15 02/12/2017 0809   BILITOT 0.4 10/12/2017 0812   BILITOT 0.40 02/12/2017 0809       RADIOGRAPHIC STUDIES: I have personally reviewed the radiological images as listed and agreed with the findings in the report. Xr Hip Unilat W Or W/o Pelvis 2-3 Views Left  Result Date: 12/07/2017 Stable left total hip replacement in good alignment.  No evidence of subsidence of the stem.  No evidence of proximal femur fracture.   All questions were answered. The patient knows to call the clinic with any problems, questions or concerns. No barriers to learning was detected.  I spent 10 minutes counseling the patient face to face. The total  time spent in the appointment was 15 minutes and more than 50% was on counseling and review of test results  Heath Lark, MD 12/13/2017 8:45 AM

## 2017-12-13 NOTE — Telephone Encounter (Signed)
Gave avs and calendar ° °

## 2018-01-10 ENCOUNTER — Encounter: Payer: Self-pay | Admitting: Adult Health

## 2018-01-10 DIAGNOSIS — Z0100 Encounter for examination of eyes and vision without abnormal findings: Secondary | ICD-10-CM | POA: Diagnosis not present

## 2018-01-10 DIAGNOSIS — H524 Presbyopia: Secondary | ICD-10-CM | POA: Diagnosis not present

## 2018-01-10 DIAGNOSIS — H52223 Regular astigmatism, bilateral: Secondary | ICD-10-CM | POA: Diagnosis not present

## 2018-01-10 DIAGNOSIS — I700 Atherosclerosis of aorta: Secondary | ICD-10-CM | POA: Insufficient documentation

## 2018-01-10 DIAGNOSIS — H4720 Unspecified optic atrophy: Secondary | ICD-10-CM | POA: Diagnosis not present

## 2018-01-10 DIAGNOSIS — H5202 Hypermetropia, left eye: Secondary | ICD-10-CM | POA: Diagnosis not present

## 2018-01-10 NOTE — Progress Notes (Signed)
Medicare wellness visit and follow up.   Assessment:   Encounter for annual medicare wellness visit  Atherosclerosis of aorta Control blood pressure, cholesterol, glucose, increase exercise.   Essential hypertension - continue medications, DASH diet, exercise and monitor at home. Call if greater than 130/80.  - CBC with Differential/Platelet - CMP/GFR - TSH  OSA with COPD overlap Sleep apnea- continue CPAP, weight loss advised.   Pulmonary emphysema, unspecified emphysema type Follow up pulmonary, continue inhaler.   Follicular lymphoma S/p treatment, followed by Dr. Alvy Bimler  Other abnormal glucose  Recent A1Cs at goal Discussed diet/exercise, weight management  Defer A1C; check CMP  CKD (chronic kidney disease) stage 3 Increase fluids, avoid NSAIDS, monitor sugars, will monitor - CMP WITH GFR  Hyperlipidemia -continue medications, check lipids, decrease fatty foods, increase activity.  - Lipid panel  Vitamin D deficiency - Vit D  25 hydroxy (rtn osteoporosis monitoring)   Medication management - Magnesium   Hypomagnesemia  Vitamin B12 deficiency Continue supplement  Gastroesophageal reflux disease with esophagitis Continue PPI/H2 blocker, diet discussed   RLS (restless legs syndrome) Exercise   Hx of adenomatous colonic polyps Up to date  Anxiety remission   Osteoporosis Had 04/2017, repeat 2021, currently off alendronate for over 1 year, will restart  Dysuria Maintain adequate hydration. Follow up if symptoms not improving, and as needed. - UA with reflex culture  - cipro 500 mg BID x 7 days  Future Appointments  Date Time Provider [place]  01/18/2018  1:00 PM Leandrew Koyanagi, MD [facility] None  02/11/2018  9:00 AM [facility] LAB 2 [facility] None  02/11/2018  9:30 AM Heath Lark, MD [facility] None  02/11/2018 10:30 AM [facility] INFUSION [facility] None  03/09/2018  2:00 PM Vicie Mutters, PA-C [facility] None  04/12/2018  9:00 AM  [facility] LAB 4 [facility] None  04/12/2018  9:30 AM Heath Lark, MD [facility] None  04/12/2018 10:00 AM [facility] INFUSION [facility] None     Plan:   During the course of the visit the patient was educated and counseled about appropriate screening and preventive services including:    Pneumococcal vaccine   Influenza vaccine  Td vaccine  Screening electrocardiogram  Screening mammography  Bone densitometry screening  Colorectal cancer screening  Diabetes screening  Glaucoma screening  Nutrition counseling   .  Subjective:   Renee Hawkins is a 77 y.o. female who presents for Medicare Annual Wellness Visit and follow up.   Patient is s/p treatment follicular lymphoma, right neck, following oncology Dr. Alvy Bimler. Finishing up with treatment over the next few months and doing very well.  She has COPD with OSA, on CPAP and respimet, wears CPAP nightly, 6-8 hours, states it is helping with breathing and energy, well controlled. Follows with Dr. Welford Roche PRN.   She reports dysuria ongoing for several weeks/months since admission for hip fracture in July.   She is on fosamax for osteoporosis x 2016 which was stopped over 1 year ago.    She is on potassium twice a day or 14meq total.   BMI is Body mass index is 19.49 kg/m., she has been working on diet and exercise. Wt Readings from Last 3 Encounters:  01/11/18 110 lb (49.9 kg)  12/13/17 106 lb 6.4 oz (48.3 kg)  10/21/17 112 lb (50.8 kg)   Her blood pressure has been controlled at home, today their BP is BP: 136/76 She does workout, still at [employer].  She denies chest pain, shortness of breath, dizziness.  She is on cholesterol medication, lipitor 80mg  1/2 pill 3 days a week and denies myalgias. Her cholesterol is at goal. The cholesterol last visit was:   Lab Results  Component Value Date   CHOL 226 (H) 08/18/2017   HDL 53 08/18/2017   LDLCALC 137 (H) 08/18/2017   TRIG 217 (H) 08/18/2017    CHOLHDL 4.3 08/18/2017   Lab Results  Component Value Date   GFRNONAA 46 (L) 12/13/2017   She has been working on diet and exercise for glucose management, and denies nausea, paresthesia of the feet and polydipsia. Last A1C in the office was:  Lab Results  Component Value Date   HGBA1C 5.4 08/18/2017   Patient is on Vitamin D supplement.   Lab Results  Component Value Date   VD25OH 77 08/18/2017     Lab Results  Component Value Date   GFRNONAA 46 (L) 12/13/2017      Medication Review Current Outpatient Medications on File Prior to Visit  Medication Sig Dispense Refill  . aspirin EC 81 MG tablet Take 81 mg by mouth daily.    . bisacodyl (DULCOLAX) 5 MG EC tablet Take 1 tablet (5 mg total) by mouth daily as needed for moderate constipation. 30 tablet 0  . Cholecalciferol 4000 UNITS CAPS Take 1 capsule by mouth 2 (two) times daily.     . Coenzyme Q10 10 MG capsule Take 10 mg by mouth 2 (two) times daily.    . feeding supplement, ENSURE ENLIVE, (ENSURE ENLIVE) LIQD Take 237 mLs by mouth 2 (two) times daily between meals. 237 mL 12  . gabapentin (NEURONTIN) 300 MG capsule TAKE 1 CAPSULE BY MOUTH 3 TO 4 TIMES DAILY FOR PAIN (Patient taking differently: Take 600 mg by mouth 2 (two) times daily. ) 360 capsule 1  . HYDROcodone-acetaminophen (NORCO) 5-325 MG tablet Take 1-2 tablets by mouth every 6 (six) hours as needed. 30 tablet 0  . Magnesium 250 MG TABS Take 250 mg by mouth daily.     Marland Kitchen omeprazole (PRILOSEC) 40 MG capsule TAKE ONE CAPSULE BY MOUTH ONCE DAILY FOR  ACID  REFLUX 90 capsule 1  . potassium chloride (K-DUR,KLOR-CON) 10 MEQ tablet Take 10-20 mEq by mouth See admin instructions. 74mEq in the AM and 81mEq in the PM    . rosuvastatin (CRESTOR) 40 MG tablet Take 1/2 to 1 tablet daily or as directed for Cholesterol 30 tablet 5  . Tiotropium Bromide-Olodaterol (STIOLTO RESPIMAT) 2.5-2.5 MCG/ACT AERS Inhale 2 Act into the lungs daily. 12 g 4  . vitamin C (ASCORBIC ACID) 500 MG  tablet Take 500 mg by mouth daily.    . Vinpocetine POWD Take 10 mcg by mouth daily. Capsule form    . [DISCONTINUED] prochlorperazine (COMPAZINE) 10 MG tablet Take 1 tablet (10 mg total) by mouth every 6 (six) hours as needed (Nausea or vomiting). 30 tablet 6   No current facility-administered medications on file prior to visit.     Current Problems (verified) Patient Active Problem List   Diagnosis Date Noted  . Aortic atherosclerosis ([place]) 01/10/2018  . Anemia, chronic disease 12/13/2017  . H/O total knee replacement, left 11/05/2017  . Closed right hip fracture, sequela 10/20/2017  . Pain of left scapula 10/12/2017  . Hypokalemia 05/28/2016  . Follicular lymphoma grade iiia, lymph nodes of head, face, and neck ([place]) 02/11/2016  . Encounter for antineoplastic chemotherapy 02/11/2016  . OSA and COPD overlap syndrome ([place]) 12/05/2014  . Osteoporosis 12/05/2014  . Medication management 03/06/2014  .  CKD (chronic kidney disease) stage 3, GFR 30-59 ml/min (HCC) 11/27/2013  . COPD (chronic obstructive pulmonary disease) with emphysema ([place]) 11/27/2013  . Hypertension   . GERD    . Hx of adenomatous colonic polyps   . Hyperlipidemia   . Other abnormal glucose   . Vitamin D deficiency   . RLS (restless legs syndrome)   . Vitamin B12 deficiency   . Anxiety     Screening Tests Immunization History  Administered Date(s) Administered  . Influenza Split 01/18/2013  . Influenza, High Dose Seasonal PF 12/05/2014, 01/23/2016, 12/22/2016  . Pneumococcal Conjugate-13 11/27/2013  . Pneumococcal Polysaccharide-23 11/19/2011  . Td 08/13/2008  . Zoster 04/06/2010   Preventative care: Last colonoscopy: 05/2014 Last mammogram: 04/2017 Last pap smear/pelvic exam: Done per GYN DEXA:02/2015 Started fosamax 2016, 04/2017 T -3.2 CT chest 2015, stable pulmonary nodule CT neck 01/29/2016 Echo 02/24/2016  Prior vaccinations: Tdap: 08/2008 Influenza: 2018, out in office Pneumonia:  2013 Prevnar: 2015 Shingles: 2012  Names of Other Physician/Practitioners you currently use: 1. [facility]- here for primary care 2. [place] eye doctor, 2019 3. dentist, last visit as needed 2017, needs to schedule  Patient Care Team: Unk Pinto, MD as PCP - General (Internal Medicine) Lafayette Dragon, MD (Inactive) as Consulting Physician (Gastroenterology) Loletha Carrow, MD (Pulmonary Disease) Calvert Cantor, MD as Consulting Physician (Ophthalmology) Heath Lark, MD as Consulting Physician (Hematology and Oncology) Rozetta Nunnery, MD as Consulting Physician (Otolaryngology)   Allergies No Known Allergies  SURGICAL HISTORY She  has a past surgical history that includes Appendectomy; Tonsillectomy; Colonoscopy (N/A, 05/18/2014); Lymph node biopsy (Right, 02/04/2016); Direct laryngoscopy (Right, 02/04/2016); Total hip arthroplasty (Left, 10/21/2017); and Cataract extraction, bilateral (2017). FAMILY HISTORY Her family history includes Stroke in her maternal grandmother and mother. SOCIAL HISTORY She  reports that she quit smoking about 5 years ago. Her smoking use included cigarettes. She has a 30.00 pack-year smoking history. She has never used smokeless tobacco. She reports that she does not drink alcohol or use drugs.  MEDICARE WELLNESS OBJECTIVES: Physical activity: Current Exercise Habits: Home exercise routine, Type of exercise: stretching;yoga;strength training/weights, Time (Minutes): 30, Frequency (Times/Week): 5, Weekly Exercise (Minutes/Week): 150, Intensity: Mild, Exercise limited by: orthopedic condition(s) Cardiac risk factors: Cardiac Risk Factors include: advanced age (>19men, >11 women);hypertension;dyslipidemia;smoking/ tobacco exposure Depression/mood screen:   Depression screen [facility] 2/9 01/11/2018  Decreased Interest 0  Down, Depressed, Hopeless 0  PHQ - 2 Score 0    ADLs:  In your present state of  health, do you have any difficulty performing the following activities: 01/11/2018 10/21/2017  Hearing? N N  Vision? N N  Difficulty concentrating or making decisions? N Y  Walking or climbing stairs? N Y  Comment - since fall  Dressing or bathing? N N  Doing errands, shopping? N N  Some recent data might be hidden    Cognitive Testing  Alert? Yes  Normal Appearance?Yes  Oriented to person? Yes  Place? Yes   Time? Yes  Recall of three objects?  1/3  Can perform simple calculations? Yes  Displays appropriate judgment?Yes  Can read the correct time from a watch face?Yes  EOL planning: Does Patient Have a Medical Advance Directive?: Yes Type of Advance Directive: Healthcare Power of Attorney, Living will Does patient want to make changes to medical advance directive?: No - Patient declined Copy of [place] in Chart?: No - copy requested  Review of Systems  Constitutional: Negative.  HENT: Negative.        + Snoring, better on CPAP  Respiratory: Positive for shortness of breath (improved with CPAP, not using O2 anymore). Negative for cough, hemoptysis, sputum production and wheezing.   Cardiovascular: Negative.   Gastrointestinal: Negative for abdominal pain, blood in stool, constipation, diarrhea, heartburn, melena, nausea and vomiting.  Genitourinary: Negative for dysuria, flank pain, frequency, hematuria and urgency.  Musculoskeletal: Negative.  Negative for falls.  Skin: Negative.   Neurological: Positive for sensory change (bilateral legs). Negative for dizziness, tingling, tremors, speech change, focal weakness, seizures and loss of consciousness.  Psychiatric/Behavioral: Negative.  Negative for depression.    Objective:     Blood pressure 136/76, pulse 75, temperature (!) 97.3 F (36.3 C), height 5\' 3"  (1.6 m), weight 110 lb (49.9 kg), SpO2 95 %. Body mass index is 19.49 kg/m.  General appearance: alert, no distress, WD/WN,  female HEENT:  normocephalic, sclerae anicteric, TMs pearly, nares patent, no discharge or erythema, pharynx normal Oral cavity: MMM, no lesions Neck: supple,  no thyromegaly, no masses Heart: RRR, normal S1, S2, no murmurs Lungs: CTA bilaterally, no wheezes, rhonchi, or rales Abdomen: +bs, soft, non tender, no rebound, distended, no masses, no hepatomegaly, no splenomegaly Musculoskeletal: nontender, no swelling, no obvious deformity Extremities: no edema, no cyanosis, no clubbing Pulses: 2+ symmetric, upper and lower extremities, normal cap refill Neurological: alert, oriented x 3, CN2-12 intact, strength normal upper extremities and lower extremities, sensation normal throughout, DTRs 2+ throughout, no cerebellar signs, gait normal Psychiatric: normal affect, behavior normal, pleasant  Breast: defer Gyn: defer  Rectal:   Medicare Attestation I have personally reviewed: The patient's medical and social history Their use of alcohol, tobacco or illicit drugs Their current medications and supplements The patient's functional ability including ADLs,fall risks, home safety risks, cognitive, and hearing and visual impairment Diet and physical activities Evidence for depression or mood disorders  The patient's weight, height, BMI, and visual acuity have been recorded in the chart.  I have made referrals, counseling, and provided education to the patient based on review of the above and I have provided the patient with a written personalized care plan for preventive services.     Izora Ribas, NP   01/11/2018

## 2018-01-11 ENCOUNTER — Ambulatory Visit (INDEPENDENT_AMBULATORY_CARE_PROVIDER_SITE_OTHER): Payer: Medicare HMO | Admitting: Adult Health

## 2018-01-11 ENCOUNTER — Encounter: Payer: Self-pay | Admitting: Adult Health

## 2018-01-11 VITALS — BP 136/76 | HR 75 | Temp 97.3°F | Ht 63.0 in | Wt 110.0 lb

## 2018-01-11 DIAGNOSIS — Z0000 Encounter for general adult medical examination without abnormal findings: Secondary | ICD-10-CM

## 2018-01-11 DIAGNOSIS — G2581 Restless legs syndrome: Secondary | ICD-10-CM

## 2018-01-11 DIAGNOSIS — J449 Chronic obstructive pulmonary disease, unspecified: Secondary | ICD-10-CM

## 2018-01-11 DIAGNOSIS — R300 Dysuria: Secondary | ICD-10-CM

## 2018-01-11 DIAGNOSIS — C8231 Follicular lymphoma grade IIIa, lymph nodes of head, face, and neck: Secondary | ICD-10-CM | POA: Diagnosis not present

## 2018-01-11 DIAGNOSIS — K219 Gastro-esophageal reflux disease without esophagitis: Secondary | ICD-10-CM

## 2018-01-11 DIAGNOSIS — I700 Atherosclerosis of aorta: Secondary | ICD-10-CM | POA: Diagnosis not present

## 2018-01-11 DIAGNOSIS — M810 Age-related osteoporosis without current pathological fracture: Secondary | ICD-10-CM

## 2018-01-11 DIAGNOSIS — Z0001 Encounter for general adult medical examination with abnormal findings: Secondary | ICD-10-CM | POA: Diagnosis not present

## 2018-01-11 DIAGNOSIS — N182 Chronic kidney disease, stage 2 (mild): Secondary | ICD-10-CM | POA: Diagnosis not present

## 2018-01-11 DIAGNOSIS — I10 Essential (primary) hypertension: Secondary | ICD-10-CM | POA: Diagnosis not present

## 2018-01-11 DIAGNOSIS — E559 Vitamin D deficiency, unspecified: Secondary | ICD-10-CM

## 2018-01-11 DIAGNOSIS — Z79899 Other long term (current) drug therapy: Secondary | ICD-10-CM

## 2018-01-11 DIAGNOSIS — J439 Emphysema, unspecified: Secondary | ICD-10-CM

## 2018-01-11 DIAGNOSIS — E538 Deficiency of other specified B group vitamins: Secondary | ICD-10-CM

## 2018-01-11 DIAGNOSIS — R7309 Other abnormal glucose: Secondary | ICD-10-CM

## 2018-01-11 DIAGNOSIS — D638 Anemia in other chronic diseases classified elsewhere: Secondary | ICD-10-CM

## 2018-01-11 DIAGNOSIS — R6889 Other general symptoms and signs: Secondary | ICD-10-CM | POA: Diagnosis not present

## 2018-01-11 DIAGNOSIS — E785 Hyperlipidemia, unspecified: Secondary | ICD-10-CM

## 2018-01-11 DIAGNOSIS — N183 Chronic kidney disease, stage 3 unspecified: Secondary | ICD-10-CM

## 2018-01-11 DIAGNOSIS — Z86010 Personal history of colonic polyps: Secondary | ICD-10-CM

## 2018-01-11 DIAGNOSIS — D649 Anemia, unspecified: Secondary | ICD-10-CM | POA: Diagnosis not present

## 2018-01-11 DIAGNOSIS — F419 Anxiety disorder, unspecified: Secondary | ICD-10-CM

## 2018-01-11 DIAGNOSIS — G4733 Obstructive sleep apnea (adult) (pediatric): Secondary | ICD-10-CM | POA: Diagnosis not present

## 2018-01-11 DIAGNOSIS — S72001S Fracture of unspecified part of neck of right femur, sequela: Secondary | ICD-10-CM

## 2018-01-11 DIAGNOSIS — M816 Localized osteoporosis [Lequesne]: Secondary | ICD-10-CM

## 2018-01-11 MED ORDER — ALENDRONATE SODIUM 70 MG PO TABS: ORAL_TABLET | ORAL | 3 refills | Status: DC

## 2018-01-11 MED ORDER — CIPROFLOXACIN HCL 500 MG PO TABS: 500.0000 mg | ORAL_TABLET | Freq: Two times a day (BID) | ORAL | 0 refills | Status: DC

## 2018-01-11 NOTE — Patient Instructions (Addendum)
  Renee Hawkins , Thank you for taking time to come for your Medicare Wellness Visit. I appreciate your ongoing commitment to your health goals. Please review the following plan we discussed and let me know if I can assist you in the future.   These are the goals we discussed: Goals    . Gain weight    . LDL CALC < 100       This is a list of the screening recommended for you and due dates:  Health Maintenance  Topic Date Due  . Flu Shot  11/04/2017  . Tetanus Vaccine  08/14/2018  . Colon Cancer Screening  05/18/2024  . DEXA scan (bone density measurement)  Completed  . Pneumonia vaccines  Completed    Know what a healthy weight is for you (roughly BMI <25) and aim to maintain this  Aim for 7+ servings of fruits and vegetables daily  65-80+ fluid ounces of water or unsweet tea for healthy kidneys  Limit to max 1 drink of alcohol per day; avoid smoking/tobacco  Limit animal fats in diet for cholesterol and heart health - choose grass fed whenever available  Avoid highly processed foods, and foods high in saturated/trans fats  Aim for low stress - take time to unwind and care for your mental health  Aim for 150 min of moderate intensity exercise weekly for heart health, and weights twice weekly for bone health  Aim for 7-9 hours of sleep daily

## 2018-01-12 ENCOUNTER — Other Ambulatory Visit: Payer: Self-pay | Admitting: Adult Health

## 2018-01-13 ENCOUNTER — Ambulatory Visit (HOSPITAL_COMMUNITY)
Admission: RE | Admit: 2018-01-13 | Discharge: 2018-01-13 | Disposition: A | Discharge status: Home / Self Care | Payer: Medicare HMO | Source: Ambulatory Visit | Attending: Adult Health | Admitting: Adult Health

## 2018-01-13 ENCOUNTER — Other Ambulatory Visit: Payer: Medicare HMO

## 2018-01-13 ENCOUNTER — Other Ambulatory Visit: Payer: Self-pay | Admitting: Adult Health

## 2018-01-13 ENCOUNTER — Encounter: Payer: Self-pay | Admitting: Neurology

## 2018-01-13 DIAGNOSIS — H5340 Unspecified visual field defects: Secondary | ICD-10-CM

## 2018-01-13 DIAGNOSIS — G319 Degenerative disease of nervous system, unspecified: Secondary | ICD-10-CM | POA: Insufficient documentation

## 2018-01-13 DIAGNOSIS — Q142 Congenital malformation of optic disc: Secondary | ICD-10-CM

## 2018-01-13 DIAGNOSIS — H53459 Other localized visual field defect, unspecified eye: Secondary | ICD-10-CM | POA: Diagnosis not present

## 2018-01-13 DIAGNOSIS — I6782 Cerebral ischemia: Secondary | ICD-10-CM | POA: Diagnosis not present

## 2018-01-13 DIAGNOSIS — Q140 Congenital malformation of vitreous humor: Secondary | ICD-10-CM | POA: Insufficient documentation

## 2018-01-13 LAB — COMPLETE METABOLIC PANEL WITH GFR
AG RATIO: 2.3 (calc) (ref 1.0–2.5)
ALKALINE PHOSPHATASE (APISO): 77 U/L (ref 33–130)
ALT: 10 U/L (ref 6–29)
AST: 14 U/L (ref 10–35)
Albumin: 4.3 g/dL (ref 3.6–5.1)
BILIRUBIN TOTAL: 0.4 mg/dL (ref 0.2–1.2)
BUN/Creatinine Ratio: 16 (calc) (ref 6–22)
BUN: 15 mg/dL (ref 7–25)
CHLORIDE: 102 mmol/L (ref 98–110)
CO2: 34 mmol/L — ABNORMAL HIGH (ref 20–32)
Calcium: 10 mg/dL (ref 8.6–10.4)
Creat: 0.94 mg/dL — ABNORMAL HIGH (ref 0.60–0.93)
GFR, EST AFRICAN AMERICAN: 69 mL/min/{1.73_m2} (ref >=60)
GFR, Est Non African American: 59 mL/min/{1.73_m2} — ABNORMAL LOW (ref >=60)
GLUCOSE: 77 mg/dL (ref 65–99)
Globulin: 1.9 g/dL (calc) (ref 1.9–3.7)
POTASSIUM: 4 mmol/L (ref 3.5–5.3)
Sodium: 143 mmol/L (ref 135–146)
Total Protein: 6.2 g/dL (ref 6.1–8.1)

## 2018-01-13 LAB — URINE CULTURE
MICRO NUMBER:: 91214076
SPECIMEN QUALITY:: ADEQUATE

## 2018-01-13 LAB — LIPID PANEL
Cholesterol: 182 mg/dL (ref <200)
HDL: 52 mg/dL (ref >50)
LDL Cholesterol (Calc): 108 mg/dL (calc) — ABNORMAL HIGH
NON-HDL CHOLESTEROL (CALC): 130 mg/dL — AB (ref <130)
TRIGLYCERIDES: 112 mg/dL (ref <150)
Total CHOL/HDL Ratio: 3.5 (calc) (ref <5.0)

## 2018-01-13 LAB — CBC WITH DIFFERENTIAL/PLATELET
Basophils Absolute: 21 cells/uL (ref 0–200)
Basophils Relative: 0.5 %
EOS PCT: 1.7 %
Eosinophils Absolute: 71 cells/uL (ref 15–500)
HEMATOCRIT: 34.5 % — AB (ref 35.0–45.0)
HEMOGLOBIN: 11 g/dL — AB (ref 11.7–15.5)
Lymphs Abs: 941 cells/uL (ref 850–3900)
MCH: 29.6 pg (ref 27.0–33.0)
MCHC: 31.9 g/dL — ABNORMAL LOW (ref 32.0–36.0)
MCV: 93 fL (ref 80.0–100.0)
MONOS PCT: 7.2 %
MPV: 11.1 fL (ref 7.5–12.5)
NEUTROS ABS: 2864 {cells}/uL (ref 1500–7800)
NEUTROS PCT: 68.2 %
Platelets: 177 10*3/uL (ref 140–400)
RBC: 3.71 10*6/uL — ABNORMAL LOW (ref 3.80–5.10)
RDW: 14 % (ref 11.0–15.0)
Total Lymphocyte: 22.4 %
WBC mixed population: 302 cells/uL (ref 200–950)
WBC: 4.2 10*3/uL (ref 3.8–10.8)

## 2018-01-13 LAB — URINALYSIS W MICROSCOPIC + REFLEX CULTURE
BILIRUBIN URINE: NEGATIVE
Glucose, UA: NEGATIVE
HGB URINE DIPSTICK: NEGATIVE
Hyaline Cast: NONE SEEN /LPF
KETONES UR: NEGATIVE
NITRITES URINE, INITIAL: NEGATIVE
Protein, ur: NEGATIVE
RBC / HPF: NONE SEEN /HPF (ref 0–2)
SPECIFIC GRAVITY, URINE: 1.01 (ref 1.001–1.03)
SQUAMOUS EPITHELIAL / LPF: NONE SEEN /HPF (ref <=5)

## 2018-01-13 LAB — TEST AUTHORIZATION

## 2018-01-13 LAB — IRON,TIBC AND FERRITIN PANEL
%SAT: 21 % (calc) (ref 16–45)
FERRITIN: 161 ng/mL (ref 16–288)
IRON: 63 ug/dL (ref 45–160)
TIBC: 297 mcg/dL (calc) (ref 250–450)

## 2018-01-13 LAB — CULTURE INDICATED

## 2018-01-13 LAB — TSH: TSH: 1.44 m[IU]/L (ref 0.40–4.50)

## 2018-01-13 LAB — MAGNESIUM: MAGNESIUM: 1.9 mg/dL (ref 1.5–2.5)

## 2018-01-13 MED ORDER — GADOBUTROL 1 MMOL/ML IV SOLN
5.0000 mL | Freq: Once | INTRAVENOUS | Status: AC | PRN
  Administered 2018-01-13: 5 mL via INTRAVENOUS

## 2018-01-13 NOTE — Progress Notes (Signed)
Received urgent communication via phone from Dr. Felix Ahmadi, OD, regarding abnormal exam noted today; left optic nerve demonstrated pallor with visual field loss (loss of RNFL central, nasal, inf/temp, sup/temp). STAT orders for CBC w/diff, ESR, CRP and MR W & W/O of brain and orbits has been placed; r/o stroke, mass/tumor, GCA. Radiology was consulted regarding most appropriate MR order. Patient has been notified and will present this afternoon for labs.

## 2018-01-14 ENCOUNTER — Ambulatory Visit (HOSPITAL_COMMUNITY): Payer: Medicare HMO

## 2018-01-14 ENCOUNTER — Telehealth: Payer: Self-pay | Admitting: Internal Medicine

## 2018-01-14 LAB — CBC WITH DIFFERENTIAL/PLATELET
BASOS ABS: 29 {cells}/uL (ref 0–200)
Basophils Relative: 0.8 %
EOS PCT: 1.7 %
Eosinophils Absolute: 61 cells/uL (ref 15–500)
HCT: 36.2 % (ref 35.0–45.0)
Hemoglobin: 11.4 g/dL — ABNORMAL LOW (ref 11.7–15.5)
Lymphs Abs: 821 cells/uL — ABNORMAL LOW (ref 850–3900)
MCH: 29.1 pg (ref 27.0–33.0)
MCHC: 31.5 g/dL — ABNORMAL LOW (ref 32.0–36.0)
MCV: 92.3 fL (ref 80.0–100.0)
MONOS PCT: 7.9 %
MPV: 11.9 fL (ref 7.5–12.5)
NEUTROS PCT: 66.8 %
Neutro Abs: 2405 cells/uL (ref 1500–7800)
PLATELETS: 135 10*3/uL — AB (ref 140–400)
RBC: 3.92 10*6/uL (ref 3.80–5.10)
RDW: 14 % (ref 11.0–15.0)
Total Lymphocyte: 22.8 %
WBC mixed population: 284 cells/uL (ref 200–950)
WBC: 3.6 10*3/uL — ABNORMAL LOW (ref 3.8–10.8)

## 2018-01-14 LAB — C-REACTIVE PROTEIN: CRP: 6.6 mg/L (ref <8.0)

## 2018-01-14 LAB — SEDIMENTATION RATE: Sed Rate: 6 mm/h (ref 0–30)

## 2018-01-14 NOTE — Telephone Encounter (Signed)
Dr Mickey Farber, [facility], requested urgent evaluation on mutual patient after abnormal eye exam. Faxed [facility] results from evaluation for optic nerve head pallor w/ visual field loss. MRI negative.

## 2018-01-18 ENCOUNTER — Ambulatory Visit (INDEPENDENT_AMBULATORY_CARE_PROVIDER_SITE_OTHER): Payer: Medicare HMO | Admitting: Orthopaedic Surgery

## 2018-01-18 ENCOUNTER — Ambulatory Visit (INDEPENDENT_AMBULATORY_CARE_PROVIDER_SITE_OTHER): Payer: Medicare HMO

## 2018-01-18 DIAGNOSIS — S72002A Fracture of unspecified part of neck of left femur, initial encounter for closed fracture: Secondary | ICD-10-CM | POA: Diagnosis not present

## 2018-01-18 DIAGNOSIS — M5416 Radiculopathy, lumbar region: Secondary | ICD-10-CM | POA: Insufficient documentation

## 2018-01-18 DIAGNOSIS — M25552 Pain in left hip: Secondary | ICD-10-CM

## 2018-01-18 DIAGNOSIS — Z96652 Presence of left artificial knee joint: Secondary | ICD-10-CM

## 2018-01-18 MED ORDER — PREDNISONE 10 MG (21) PO TBPK: ORAL_TABLET | ORAL | 0 refills | Status: DC

## 2018-01-18 NOTE — Progress Notes (Signed)
Office Visit Note   Patient: Renee Hawkins           Date of Birth: 1941/03/25           MRN: [id number] Visit Date: 01/18/2018              Requested by: Unk Pinto, [place] [address] [place], [place] [address] PCP: Unk Pinto, MD   Assessment & Plan: Visit Diagnoses:  1. Pain in left hip   2. Left displaced femoral neck fracture ([place])   3. H/O total knee replacement, left   4. Lumbar radiculopathy     Plan: Impression lumbar radiculopathy and low back pain.  Symptoms are more consistent with lumbar spine pathology.  Steroid Dosepak was called in today.  I gave her a referral to physical therapy.  Recheck in 3 months with standing AP pelvis and lateral hip.  Follow-Up Instructions: Return in about 3 months (around 04/20/2018).   Orders:  Orders Placed This Encounter  Procedures  . XR HIP UNILAT W OR W/O PELVIS 2-3 VIEWS LEFT   Meds ordered this encounter  Medications  . predniSONE (STERAPRED UNI-PAK 21 TAB) 10 MG (21) TBPK tablet    Sig: Take as directed    Dispense:  21 tablet    Refill:  0      Procedures: No procedures performed   Clinical Data: No additional findings.   Subjective: Chief Complaint  Patient presents with  . Left Hip - Pain    Manus Gunning is almost 3 months status post left total hip replacement for femoral neck fracture.  She is complaining of low back pain with radicular symptoms.  She denies any hip pain.  She has completed physical therapy.  The back pain is worse with prolonged sitting.  The pain will radiate down the back of the thigh.   Review of Systems  Constitutional: Negative.   HENT: Negative.   Eyes: Negative.   Respiratory: Negative.   Cardiovascular: Negative.   Endocrine: Negative.   Musculoskeletal: Negative.   Neurological: Negative.   Hematological: Negative.   Psychiatric/Behavioral: Negative.   All other systems reviewed and are negative.    Objective: Vital Signs: There were no  vitals taken for this visit.  Physical Exam  Constitutional: She is oriented to person, place, and time. She appears well-developed and well-nourished.  HENT:  Head: Normocephalic and atraumatic.  Eyes: EOM are normal.  Neck: Neck supple.  Pulmonary/Chest: Effort normal.  Abdominal: Soft.  Neurological: She is alert and oriented to person, place, and time.  Skin: Skin is warm. Capillary refill takes less than 2 seconds.  Psychiatric: She has a normal mood and affect. Her behavior is normal. Judgment and thought content normal.  Nursing note and vitals reviewed.   Ortho Exam Left hip exam shows fully healed surgical scar.  Leg lengths are equal.  No pain with hip rotation.  Negative sciatic tension signs.  Lumbar spine is tender to palpation.  Initial tuberosity is tender to palpation. Specialty Comments:  No specialty comments available.  Imaging: Xr Hip Unilat W Or W/o Pelvis 2-3 Views Left  Result Date: 01/18/2018 Stable left total hip replacement.    PMFS History: Patient Active Problem List   Diagnosis Date Noted  . Left displaced femoral neck fracture ([place]) 01/18/2018  . Lumbar radiculopathy 01/18/2018  . Aortic atherosclerosis ([place]) 01/10/2018  . Anemia, chronic disease 12/13/2017  . H/O total knee replacement, left 11/05/2017  . Closed right hip fracture, sequela 10/20/2017  .  Pain of left scapula 10/12/2017  . Hypokalemia 05/28/2016  . Follicular lymphoma grade iiia, lymph nodes of head, face, and neck ([place]) 02/11/2016  . Encounter for antineoplastic chemotherapy 02/11/2016  . OSA and COPD overlap syndrome ([place]) 12/05/2014  . Osteoporosis 12/05/2014  . Medication management 03/06/2014  . CKD (chronic kidney disease) stage 3, GFR 30-59 ml/min (HCC) 11/27/2013  . COPD (chronic obstructive pulmonary disease) with emphysema ([place]) 11/27/2013  . Hypertension   . GERD    . Hx of adenomatous colonic polyps   . Hyperlipidemia   . Other abnormal glucose   . Vitamin D  deficiency   . RLS (restless legs syndrome)   . Vitamin B12 deficiency   . Anxiety    Past Medical History:  Diagnosis Date  . Anxiety   . COPD (chronic obstructive pulmonary disease) ([place])   . Diverticulosis   . Diverticulosis of colon with hemorrhage   . GERD (gastroesophageal reflux disease)   . Hx of adenomatous colonic polyps   . Hyperlipidemia   . Hypertension   . Hypomagnesemia   . Neuropathy   . Peripheral neuropathy   . Prediabetes   . RLS (restless legs syndrome)   . Sleep apnea    CPAP  . Vitamin B12 deficiency   . Vitamin D deficiency     Family History  Problem Relation Age of Onset  . Stroke Mother   . Stroke Maternal Grandmother   . Colon cancer Neg Hx     Past Surgical History:  Procedure Laterality Date  . APPENDECTOMY    . CATARACT EXTRACTION, BILATERAL  2017   Bilateral cataract Dr. Bing Plume  . COLONOSCOPY N/A 05/18/2014   Procedure: COLONOSCOPY;  Surgeon: Lafayette Dragon, MD;  Location: [facility];  Service: Endoscopy;  Laterality: N/A;  . DIRECT LARYNGOSCOPY Right 02/04/2016   Procedure: DIRECT LARYNGOSCOPY;  Surgeon: Rozetta Nunnery, MD;  Location: [facility];  Service: ENT;  Laterality: Right;  . LYMPH NODE BIOPSY Right 02/04/2016   Procedure: EXCISIONAL BIOPSY RIGHT NECK LYMPH NODE;  Surgeon: Rozetta Nunnery, MD;  Location: [facility];  Service: ENT;  Laterality: Right;  . TONSILLECTOMY    . TOTAL HIP ARTHROPLASTY Left 10/21/2017   Procedure: TOTAL HIP ARTHROPLASTY ANTERIOR APPROACH;  Surgeon: Leandrew Koyanagi, MD;  Location: [facility];  Service: Orthopedics;  Laterality: Left;   Social History   Occupational History  . Occupation: Retired     Fish farm manager: RETIRED  Tobacco Use  . Smoking status: Former Smoker    Packs/day: 1.00    Years: 30.00    Pack years: 30.00    Types: Cigarettes    Last attempt to quit: 04/06/2012    Years since quitting: 5.7  . Smokeless tobacco: Never Used  Substance and Sexual Activity   . Alcohol use: No  . Drug use: No  . Sexual activity: Not Currently

## 2018-02-03 ENCOUNTER — Telehealth: Payer: Self-pay | Admitting: Hematology and Oncology

## 2018-02-03 NOTE — Telephone Encounter (Signed)
NG out 10/30 thru 11/29 - moved 11/8 f/u to [facility]. Left message for patient and mailed schedule. Patient also my chart active.

## 2018-02-11 ENCOUNTER — Inpatient Hospital Stay: Payer: Medicare HMO | Attending: Hematology and Oncology

## 2018-02-11 ENCOUNTER — Inpatient Hospital Stay: Payer: Medicare HMO

## 2018-02-11 ENCOUNTER — Telehealth: Payer: Self-pay | Admitting: Hematology and Oncology

## 2018-02-11 ENCOUNTER — Inpatient Hospital Stay (HOSPITAL_BASED_OUTPATIENT_CLINIC_OR_DEPARTMENT_OTHER): Payer: Medicare HMO | Admitting: Oncology

## 2018-02-11 ENCOUNTER — Encounter: Payer: Self-pay | Admitting: Oncology

## 2018-02-11 VITALS — BP 134/80 | HR 74 | Temp 97.8°F | Resp 18

## 2018-02-11 VITALS — BP 117/70 | HR 96 | Temp 98.0°F | Resp 18 | Ht 63.0 in | Wt 110.5 lb

## 2018-02-11 DIAGNOSIS — C8231 Follicular lymphoma grade IIIa, lymph nodes of head, face, and neck: Secondary | ICD-10-CM | POA: Diagnosis not present

## 2018-02-11 DIAGNOSIS — Z79899 Other long term (current) drug therapy: Secondary | ICD-10-CM | POA: Diagnosis not present

## 2018-02-11 DIAGNOSIS — Z7982 Long term (current) use of aspirin: Secondary | ICD-10-CM | POA: Diagnosis not present

## 2018-02-11 DIAGNOSIS — Z5112 Encounter for antineoplastic immunotherapy: Secondary | ICD-10-CM | POA: Insufficient documentation

## 2018-02-11 DIAGNOSIS — R7303 Prediabetes: Secondary | ICD-10-CM | POA: Insufficient documentation

## 2018-02-11 DIAGNOSIS — J449 Chronic obstructive pulmonary disease, unspecified: Secondary | ICD-10-CM

## 2018-02-11 DIAGNOSIS — G629 Polyneuropathy, unspecified: Secondary | ICD-10-CM | POA: Diagnosis not present

## 2018-02-11 DIAGNOSIS — I10 Essential (primary) hypertension: Secondary | ICD-10-CM

## 2018-02-11 DIAGNOSIS — D638 Anemia in other chronic diseases classified elsewhere: Secondary | ICD-10-CM | POA: Insufficient documentation

## 2018-02-11 DIAGNOSIS — F419 Anxiety disorder, unspecified: Secondary | ICD-10-CM | POA: Insufficient documentation

## 2018-02-11 LAB — CBC WITH DIFFERENTIAL/PLATELET
Abs Immature Granulocytes: 0.01 10*3/uL (ref 0.00–0.07)
BASOS ABS: 0 10*3/uL (ref 0.0–0.1)
Basophils Relative: 1 %
EOS ABS: 0.1 10*3/uL (ref 0.0–0.5)
EOS PCT: 2 %
HEMATOCRIT: 37.3 % (ref 36.0–46.0)
Hemoglobin: 11.7 g/dL — ABNORMAL LOW (ref 12.0–15.0)
Immature Granulocytes: 0 %
Lymphocytes Relative: 17 %
Lymphs Abs: 0.7 10*3/uL (ref 0.7–4.0)
MCH: 29.9 pg (ref 26.0–34.0)
MCHC: 31.4 g/dL (ref 30.0–36.0)
MCV: 95.4 fL (ref 80.0–100.0)
Monocytes Absolute: 0.3 10*3/uL (ref 0.1–1.0)
Monocytes Relative: 7 %
NRBC: 0 % (ref 0.0–0.2)
Neutro Abs: 3.2 10*3/uL (ref 1.7–7.7)
Neutrophils Relative %: 73 %
Platelets: 159 10*3/uL (ref 150–400)
RBC: 3.91 MIL/uL (ref 3.87–5.11)
RDW: 14 % (ref 11.5–15.5)
WBC: 4.4 10*3/uL (ref 4.0–10.5)

## 2018-02-11 LAB — COMPREHENSIVE METABOLIC PANEL
ALT: 9 U/L (ref 0–44)
ANION GAP: 11 (ref 5–15)
AST: 12 U/L — ABNORMAL LOW (ref 15–41)
Albumin: 3.5 g/dL (ref 3.5–5.0)
Alkaline Phosphatase: 81 U/L (ref 38–126)
BILIRUBIN TOTAL: 0.3 mg/dL (ref 0.3–1.2)
BUN: 12 mg/dL (ref 8–23)
CO2: 28 mmol/L (ref 22–32)
Calcium: 8.9 mg/dL (ref 8.9–10.3)
Chloride: 106 mmol/L (ref 98–111)
Creatinine, Ser: 1.01 mg/dL — ABNORMAL HIGH (ref 0.44–1.00)
GFR, EST NON AFRICAN AMERICAN: 53 mL/min — AB (ref >60)
Glucose, Bld: 97 mg/dL (ref 70–99)
POTASSIUM: 3.7 mmol/L (ref 3.5–5.1)
Sodium: 145 mmol/L (ref 135–145)
TOTAL PROTEIN: 6.5 g/dL (ref 6.5–8.1)

## 2018-02-11 MED ORDER — FAMOTIDINE IN NACL 20-0.9 MG/50ML-% IV SOLN: 20.0000 mg | Freq: Two times a day (BID) | INTRAVENOUS | Status: DC

## 2018-02-11 MED ORDER — SODIUM CHLORIDE 0.9 % IV SOLN
375.0000 mg/m2 | Freq: Once | INTRAVENOUS | Status: AC
  Administered 2018-02-11: 600 mg via INTRAVENOUS
  Filled 2018-02-11: qty 50

## 2018-02-11 MED ORDER — ACETAMINOPHEN 325 MG PO TABS
650.0000 mg | ORAL_TABLET | Freq: Once | ORAL | Status: AC
  Administered 2018-02-11: 650 mg via ORAL

## 2018-02-11 MED ORDER — SODIUM CHLORIDE 0.9 % IV SOLN
Freq: Once | INTRAVENOUS | Status: AC
  Administered 2018-02-11: 10:00:00 via INTRAVENOUS
  Filled 2018-02-11: qty 250

## 2018-02-11 MED ORDER — ACETAMINOPHEN 325 MG PO TABS
ORAL_TABLET | ORAL | Status: AC
  Filled 2018-02-11: qty 2

## 2018-02-11 MED ORDER — SODIUM CHLORIDE 0.9 % IV SOLN
20.0000 mg | Freq: Once | INTRAVENOUS | Status: AC
  Administered 2018-02-11: 20 mg via INTRAVENOUS
  Filled 2018-02-11: qty 2

## 2018-02-11 NOTE — Progress Notes (Signed)
[facility] OFFICE PROGRESS NOTE  Unk Pinto, MD [address] [place] [address]    Follicular lymphoma grade iiia, lymph nodes of head, face, and neck ([place])   01/29/2016 Imaging    Ct neck showed right submandibular lymphadenopathy and asymmetric prominence of jugular chain nodes. Metastatic disease is the primary diagnostic concern and submandibular node sampling is recommended. No primary lesion is seen, limited at the oral cavity due to dental artifact. 2. Emphysema    02/04/2016 Pathology Results    Accession: [id number] Biopsy showed high grade follicular lymphoma    88/41/6606 PET scan    Hypermetabolic right submandibular and right level II/III lymph nodes, consistent with the given history of lymphoma. No hypermetabolic lymph nodes in the chest, abdomen or pelvis. Aortic atherosclerosis (ICD10-170.0). Coronary artery calcification. Emphysema (ICD10-J43.9).    02/24/2016 Imaging    ECHO showed LV The cavity size was normal. Systolic function was   normal. The estimated ejection fraction was in the range of 55% to 60%. Wall motion was normal; there were no regional wall motion abnormalities. Doppler parameters are consistent with abnormal left ventricular relaxation (grade 1 diastolic dysfunction).    03/13/2016 - 04/24/2016 Chemotherapy    The patient had R-CHOP chemo x 3 cycles    05/27/2016 PET scan    No metabolic activity associated with the RIGHT submandibular gland or RIGHT cervical lymph nodes consistent with a complete response. 2. Bilateral pulmonary nodules are not changed from comparison exam. 3. Diffuse mild esophagus metabolic activity suggests mild esophagitis.     CURRENT THERAPY: Maintenance Rituxan every 2 months.  INTERVAL HISTORY: Renee Hawkins 77 y.o. female returns for routine follow-up visit accompanied by her family member.  The patient is feeling fine today and has no specific complaints.  Her hip pain has  resolved.  Denies fevers and chills.  Denies chest pain, shortness breath, cough, hemoptysis.  Denies nausea, vomiting, constipation, diarrhea.  Denies recent weight loss or night sweats.  Denies new lymphadenopathy or recent infection.  The patient is here for evaluation prior to her next dose of maintenance Rituxan.  MEDICAL HISTORY: Past Medical History:  Diagnosis Date  . Anxiety   . COPD (chronic obstructive pulmonary disease) ([place])   . Diverticulosis   . Diverticulosis of colon with hemorrhage   . GERD (gastroesophageal reflux disease)   . Hx of adenomatous colonic polyps   . Hyperlipidemia   . Hypertension   . Hypomagnesemia   . Neuropathy   . Peripheral neuropathy   . Prediabetes   . RLS (restless legs syndrome)   . Sleep apnea    CPAP  . Vitamin B12 deficiency   . Vitamin D deficiency     ALLERGIES:  has No Known Allergies.  MEDICATIONS:  Current Outpatient Medications  Medication Sig Dispense Refill  . alendronate (FOSAMAX) 70 MG tablet Take 1 tablet weekly with a full glass of water for 30 minutes on an empty stomach 12 tablet 3  . aspirin EC 81 MG tablet Take 81 mg by mouth daily.    . Coenzyme Q10 10 MG capsule Take 10 mg by mouth 2 (two) times daily.    Marland Kitchen gabapentin (NEURONTIN) 300 MG capsule TAKE 1 CAPSULE BY MOUTH 3 TO 4 TIMES DAILY FOR PAIN (Patient taking differently: Take 600 mg by mouth 2 (two) times daily. ) 360 capsule 1  . Magnesium 250 MG TABS Take 250 mg by mouth daily.     Marland Kitchen omeprazole (PRILOSEC) 40  MG capsule TAKE ONE CAPSULE BY MOUTH ONCE DAILY FOR  ACID  REFLUX 90 capsule 1  . potassium chloride (K-DUR,KLOR-CON) 10 MEQ tablet Take 10-20 mEq by mouth See admin instructions. 93mEq in the AM and 24mEq in the PM    . rosuvastatin (CRESTOR) 40 MG tablet Take 1/2 to 1 tablet daily or as directed for Cholesterol 30 tablet 5  . Tiotropium Bromide-Olodaterol (STIOLTO RESPIMAT) 2.5-2.5 MCG/ACT AERS Inhale 2 Act into the lungs daily. 12 g 4  . Vinpocetine POWD  Take 10 mcg by mouth daily. Capsule form    . vitamin C (ASCORBIC ACID) 500 MG tablet Take 500 mg by mouth daily.    . bisacodyl (DULCOLAX) 5 MG EC tablet Take 1 tablet (5 mg total) by mouth daily as needed for moderate constipation. 30 tablet 0  . Cholecalciferol 4000 UNITS CAPS Take 1 capsule by mouth 2 (two) times daily.     . feeding supplement, ENSURE ENLIVE, (ENSURE ENLIVE) LIQD Take 237 mLs by mouth 2 (two) times daily between meals. 237 mL 12  . HYDROcodone-acetaminophen (NORCO) 5-325 MG tablet Take 1-2 tablets by mouth every 6 (six) hours as needed. 30 tablet 0   No current facility-administered medications for this visit.    Facility-Administered Medications Ordered in Other Visits  Medication Dose Route Frequency Provider Last Rate Last Dose  . riTUXimab (RITUXAN) 600 mg in sodium chloride 0.9 % 190 mL infusion  375 mg/m2 (Treatment Plan Recorded) Intravenous Once Heath Lark, MD        SURGICAL HISTORY:  Past Surgical History:  Procedure Laterality Date  . APPENDECTOMY    . CATARACT EXTRACTION, BILATERAL  2017   Bilateral cataract Dr. Bing Plume  . COLONOSCOPY N/A 05/18/2014   Procedure: COLONOSCOPY;  Surgeon: Lafayette Dragon, MD;  Location: [facility];  Service: Endoscopy;  Laterality: N/A;  . DIRECT LARYNGOSCOPY Right 02/04/2016   Procedure: DIRECT LARYNGOSCOPY;  Surgeon: Rozetta Nunnery, MD;  Location: [facility];  Service: ENT;  Laterality: Right;  . LYMPH NODE BIOPSY Right 02/04/2016   Procedure: EXCISIONAL BIOPSY RIGHT NECK LYMPH NODE;  Surgeon: Rozetta Nunnery, MD;  Location: [facility];  Service: ENT;  Laterality: Right;  . TONSILLECTOMY    . TOTAL HIP ARTHROPLASTY Left 10/21/2017   Procedure: TOTAL HIP ARTHROPLASTY ANTERIOR APPROACH;  Surgeon: Leandrew Koyanagi, MD;  Location: [facility];  Service: Orthopedics;  Laterality: Left;    REVIEW OF SYSTEMS:   Review of Systems  Constitutional: Negative for appetite change, chills, fatigue, fever  and unexpected weight change.  HENT:   Negative for mouth sores, nosebleeds, sore throat and trouble swallowing.   Eyes: Negative for eye problems and icterus.  Respiratory: Negative for cough, hemoptysis, shortness of breath and wheezing.   Cardiovascular: Negative for chest pain and leg swelling.  Gastrointestinal: Negative for abdominal pain, constipation, diarrhea, nausea and vomiting.  Genitourinary: Negative for bladder incontinence, difficulty urinating, dysuria, frequency and hematuria.   Musculoskeletal: Negative for back pain, gait problem, neck pain and neck stiffness.  Skin: Negative for itching and rash.  Neurological: Negative for dizziness, extremity weakness, gait problem, headaches, light-headedness and seizures.  Hematological: Negative for adenopathy. Does not bruise/bleed easily.  Psychiatric/Behavioral: Negative for confusion, depression and sleep disturbance. The patient is not nervous/anxious.     PHYSICAL EXAMINATION:  Blood pressure 117/70, pulse 96, temperature 98 F (36.7 C), temperature source Oral, resp. rate 18, height 5\' 3"  (1.6 m), weight 110 lb 8 oz (50.1 kg), SpO2  90 %.  ECOG PERFORMANCE STATUS: 1 - Symptomatic but completely ambulatory  Physical Exam  Constitutional: Oriented to person, place, and time and well-developed, well-nourished, and in no distress. No distress.  HENT:  Head: Normocephalic and atraumatic.  Mouth/Throat: Oropharynx is clear and moist. No oropharyngeal exudate.  Eyes: Conjunctivae are normal. Right eye exhibits no discharge. Left eye exhibits no discharge. No scleral icterus.  Neck: Normal range of motion. Neck supple.  Cardiovascular: Normal rate, regular rhythm, normal heart sounds and intact distal pulses.   Pulmonary/Chest: Effort normal and breath sounds normal. No respiratory distress. No wheezes. No rales.  Abdominal: Soft. Bowel sounds are normal. Exhibits no distension and no mass. There is no tenderness.   Musculoskeletal: Normal range of motion. Exhibits no edema.  Lymphadenopathy:    No cervical adenopathy.  Neurological: Alert and oriented to person, place, and time. Exhibits normal muscle tone. Gait normal. Coordination normal.  Skin: Skin is warm and dry. No rash noted. Not diaphoretic. No erythema. No pallor.  Psychiatric: Mood, memory and judgment normal.  Vitals reviewed.  LABORATORY DATA: Lab Results  Component Value Date   WBC 4.4 02/11/2018   HGB 11.7 (L) 02/11/2018   HCT 37.3 02/11/2018   MCV 95.4 02/11/2018   PLT 159 02/11/2018      Chemistry      Component Value Date/Time   NA 145 02/11/2018 0834   NA 142 02/12/2017 0809   K 3.7 02/11/2018 0834   K 4.0 02/12/2017 0809   CL 106 02/11/2018 0834   CO2 28 02/11/2018 0834   CO2 28 02/12/2017 0809   BUN 12 02/11/2018 0834   BUN 11.2 02/12/2017 0809   CREATININE 1.01 (H) 02/11/2018 0834   CREATININE 0.94 (H) 01/11/2018 1506   CREATININE 0.9 02/12/2017 0809      Component Value Date/Time   CALCIUM 8.9 02/11/2018 0834   CALCIUM 9.3 02/12/2017 0809   ALKPHOS 81 02/11/2018 0834   ALKPHOS 88 02/12/2017 0809   AST 12 (L) 02/11/2018 0834   AST 16 02/12/2017 0809   ALT 9 02/11/2018 0834   ALT 15 02/12/2017 0809   BILITOT 0.3 02/11/2018 0834   BILITOT 0.40 02/12/2017 0809       RADIOGRAPHIC STUDIES:  Mr Jeri Cos UJ Contrast  Result Date: 01/13/2018 CLINICAL DATA:  Initial evaluation for left optic nerve pallor with visual field loss. Evaluate for stroke or mass. EXAM: MRI HEAD AND ORBITS WITHOUT AND WITH CONTRAST TECHNIQUE: Multiplanar, multiecho pulse sequences of the brain and surrounding structures were obtained without and with intravenous contrast. Multiplanar, multiecho pulse sequences of the orbits and surrounding structures were obtained including fat saturation techniques, before and after intravenous contrast administration. CONTRAST:  5 cc of Gadavist. COMPARISON:  None available. FINDINGS: MRI HEAD  FINDINGS Brain: Generalized age-related cerebral volume loss. Mild scattered patchy T2/FLAIR hyperintensity within the periventricular and deep white matter both cerebral hemispheres most consistent with chronic small vessel ischemic disease, mild for age. No abnormal foci of restricted diffusion to suggest acute or subacute ischemia. Gray-white matter differentiation maintained. No areas of chronic cortical infarction. No foci of susceptibility artifact to suggest acute or chronic intracranial hemorrhage. No mass lesion, midline shift or mass effect. No hydrocephalus. No extra-axial fluid collection. Pituitary gland within normal limits. No abnormal enhancement. Vascular: Major intracranial vascular flow voids maintained. Right vertebral artery hypoplastic and not well seen. Skull and upper cervical spine: Craniocervical junction within normal limits. Bone marrow signal intensity within normal limits. No scalp soft tissue  abnormality. Other: Mastoid air cells largely clear. Inner ear structures within normal limits. MRI ORBITS FINDINGS Orbits: Globes symmetric in size with normal appearance and morphology bilaterally. Patient status post ocular lens replacement bilaterally. Optic nerves symmetric in size without acute abnormality. No appreciable optic nerve edema or enhancement. No abnormality at the orbital apices. Optic chiasm normally situated within the suprasellar cistern. Extra-ocular muscles symmetric and within normal limits. Lacrimal glands normal. Superior orbital veins symmetric and within normal limits. Intraconal and extraconal fat well-maintained. Visualized sinuses: Scattered mucosal thickening throughout the visualized ethmoidal air cells and frontal sinuses. Soft tissues: Visualized periorbital soft tissues within normal limits. IMPRESSION: MRI HEAD IMPRESSION: 1. No acute intracranial abnormality. 2. Age-related cerebral atrophy with mild chronic small vessel ischemic disease. MRI ORBITS  IMPRESSION: Negative MRI of the orbits.  No acute abnormality identified. Electronically Signed   By: Jeannine Boga M.D.   On: 01/13/2018 20:01   Xr Hip Unilat W Or W/o Pelvis 2-3 Views Left  Result Date: 01/18/2018 Stable left total hip replacement.  Mr Rosealee Albee Wo Contrast  Result Date: 01/13/2018 CLINICAL DATA:  Initial evaluation for left optic nerve pallor with visual field loss. Evaluate for stroke or mass. EXAM: MRI HEAD AND ORBITS WITHOUT AND WITH CONTRAST TECHNIQUE: Multiplanar, multiecho pulse sequences of the brain and surrounding structures were obtained without and with intravenous contrast. Multiplanar, multiecho pulse sequences of the orbits and surrounding structures were obtained including fat saturation techniques, before and after intravenous contrast administration. CONTRAST:  5 cc of Gadavist. COMPARISON:  None available. FINDINGS: MRI HEAD FINDINGS Brain: Generalized age-related cerebral volume loss. Mild scattered patchy T2/FLAIR hyperintensity within the periventricular and deep white matter both cerebral hemispheres most consistent with chronic small vessel ischemic disease, mild for age. No abnormal foci of restricted diffusion to suggest acute or subacute ischemia. Gray-white matter differentiation maintained. No areas of chronic cortical infarction. No foci of susceptibility artifact to suggest acute or chronic intracranial hemorrhage. No mass lesion, midline shift or mass effect. No hydrocephalus. No extra-axial fluid collection. Pituitary gland within normal limits. No abnormal enhancement. Vascular: Major intracranial vascular flow voids maintained. Right vertebral artery hypoplastic and not well seen. Skull and upper cervical spine: Craniocervical junction within normal limits. Bone marrow signal intensity within normal limits. No scalp soft tissue abnormality. Other: Mastoid air cells largely clear. Inner ear structures within normal limits. MRI ORBITS FINDINGS  Orbits: Globes symmetric in size with normal appearance and morphology bilaterally. Patient status post ocular lens replacement bilaterally. Optic nerves symmetric in size without acute abnormality. No appreciable optic nerve edema or enhancement. No abnormality at the orbital apices. Optic chiasm normally situated within the suprasellar cistern. Extra-ocular muscles symmetric and within normal limits. Lacrimal glands normal. Superior orbital veins symmetric and within normal limits. Intraconal and extraconal fat well-maintained. Visualized sinuses: Scattered mucosal thickening throughout the visualized ethmoidal air cells and frontal sinuses. Soft tissues: Visualized periorbital soft tissues within normal limits. IMPRESSION: MRI HEAD IMPRESSION: 1. No acute intracranial abnormality. 2. Age-related cerebral atrophy with mild chronic small vessel ischemic disease. MRI ORBITS IMPRESSION: Negative MRI of the orbits.  No acute abnormality identified. Electronically Signed   By: Jeannine Boga M.D.   On: 01/13/2018 20:01     ASSESSMENT/PLAN:  Follicular lymphoma grade iiia, lymph nodes of head, face, and neck ([place]) This is a pleasant 77 year old female with follicular lymphoma currently on maintenance Rituxan every 2 months.  Last dose expected in January 2020.  She is asymptomatic today recommend for her  to proceed with Rituxan today as scheduled.  She will follow-up in 2 months for evaluation prior to her next dose of maintenance Rituxan.  I will defer the decision regarding timing of any restaging scans to Dr. Alvy Bimler.  The patient has already received her influenza vaccine this season.  Anemia, chronic disease The patient has anemia chronic disease.  Hemoglobin continues to improve.  She is asymptomatic.  We will continue to monitor.   No orders of the defined types were placed in this encounter.    Mikey Bussing, DNP, AGPCNP-BC, AOCNP 02/11/18

## 2018-02-11 NOTE — Patient Instructions (Signed)
[facility] Discharge Instructions for Patients Receiving Chemotherapy  Today you received the following chemotherapy agents Rituximab (Rituxan).  To help prevent nausea and vomiting after your treatment, we encourage you to take your nausea medication as prescribed.   If you develop nausea and vomiting that is not controlled by your nausea medication, call the clinic.   BELOW ARE SYMPTOMS THAT SHOULD BE REPORTED IMMEDIATELY:  *FEVER GREATER THAN 100.5 F  *CHILLS WITH OR WITHOUT FEVER  NAUSEA AND VOMITING THAT IS NOT CONTROLLED WITH YOUR NAUSEA MEDICATION  *UNUSUAL SHORTNESS OF BREATH  *UNUSUAL BRUISING OR BLEEDING  TENDERNESS IN MOUTH AND THROAT WITH OR WITHOUT PRESENCE OF ULCERS  *URINARY PROBLEMS  *BOWEL PROBLEMS  UNUSUAL RASH Items with * indicate a potential emergency and should be followed up as soon as possible.  Feel free to call the clinic should you have any questions or concerns. The clinic phone number is [phone].  Please show the CHEMO ALERT CARD at check-in to the Emergency Department and triage nurse.   

## 2018-02-11 NOTE — Telephone Encounter (Signed)
Appts scheduled avs/calendar printed per 11/8 los °

## 2018-02-11 NOTE — Assessment & Plan Note (Signed)
The patient has anemia chronic disease.  Hemoglobin continues to improve.  She is asymptomatic.  We will continue to monitor.

## 2018-02-11 NOTE — Assessment & Plan Note (Signed)
This is a pleasant 77 year old female with follicular lymphoma currently on maintenance Rituxan every 2 months.  Last dose expected in January 2020.  She is asymptomatic today recommend for her to proceed with Rituxan today as scheduled.  She will follow-up in 2 months for evaluation prior to her next dose of maintenance Rituxan.  I will defer the decision regarding timing of any restaging scans to Dr. Alvy Bimler.  The patient has already received her influenza vaccine this season.

## 2018-03-09 ENCOUNTER — Encounter: Payer: Self-pay | Admitting: Physician Assistant

## 2018-03-22 ENCOUNTER — Ambulatory Visit: Payer: Medicare HMO | Admitting: Neurology

## 2018-04-04 ENCOUNTER — Inpatient Hospital Stay (HOSPITAL_BASED_OUTPATIENT_CLINIC_OR_DEPARTMENT_OTHER): Payer: Medicare HMO | Admitting: Hematology and Oncology

## 2018-04-04 ENCOUNTER — Inpatient Hospital Stay: Payer: Medicare HMO

## 2018-04-04 ENCOUNTER — Inpatient Hospital Stay: Payer: Medicare HMO | Attending: Hematology and Oncology

## 2018-04-04 ENCOUNTER — Telehealth: Payer: Self-pay | Admitting: Hematology and Oncology

## 2018-04-04 ENCOUNTER — Encounter: Payer: Self-pay | Admitting: Hematology and Oncology

## 2018-04-04 VITALS — BP 125/65 | HR 71 | Temp 97.7°F | Resp 18

## 2018-04-04 VITALS — BP 148/72 | HR 82 | Temp 98.2°F | Resp 18 | Ht 63.0 in | Wt 113.8 lb

## 2018-04-04 DIAGNOSIS — I129 Hypertensive chronic kidney disease with stage 1 through stage 4 chronic kidney disease, or unspecified chronic kidney disease: Secondary | ICD-10-CM

## 2018-04-04 DIAGNOSIS — Z5112 Encounter for antineoplastic immunotherapy: Secondary | ICD-10-CM | POA: Diagnosis not present

## 2018-04-04 DIAGNOSIS — N183 Chronic kidney disease, stage 3 unspecified: Secondary | ICD-10-CM

## 2018-04-04 DIAGNOSIS — I10 Essential (primary) hypertension: Secondary | ICD-10-CM

## 2018-04-04 DIAGNOSIS — Z7982 Long term (current) use of aspirin: Secondary | ICD-10-CM | POA: Insufficient documentation

## 2018-04-04 DIAGNOSIS — C8231 Follicular lymphoma grade IIIa, lymph nodes of head, face, and neck: Secondary | ICD-10-CM | POA: Diagnosis not present

## 2018-04-04 DIAGNOSIS — Z79899 Other long term (current) drug therapy: Secondary | ICD-10-CM

## 2018-04-04 LAB — COMPREHENSIVE METABOLIC PANEL
ALK PHOS: 61 U/L (ref 38–126)
ALT: 12 U/L (ref 0–44)
AST: 15 U/L (ref 15–41)
Albumin: 3.4 g/dL — ABNORMAL LOW (ref 3.5–5.0)
Anion gap: 10 (ref 5–15)
BUN: 16 mg/dL (ref 8–23)
CALCIUM: 8.9 mg/dL (ref 8.9–10.3)
CO2: 28 mmol/L (ref 22–32)
CREATININE: 0.87 mg/dL (ref 0.44–1.00)
Chloride: 106 mmol/L (ref 98–111)
Glucose, Bld: 91 mg/dL (ref 70–99)
Potassium: 3.6 mmol/L (ref 3.5–5.1)
Sodium: 144 mmol/L (ref 135–145)
Total Bilirubin: <0.2 mg/dL — ABNORMAL LOW (ref 0.3–1.2)
Total Protein: 6.3 g/dL — ABNORMAL LOW (ref 6.5–8.1)

## 2018-04-04 LAB — CBC WITH DIFFERENTIAL/PLATELET
Abs Immature Granulocytes: 0 K/uL (ref 0.00–0.07)
Basophils Absolute: 0 K/uL (ref 0.0–0.1)
Basophils Relative: 1 %
Eosinophils Absolute: 0.1 K/uL (ref 0.0–0.5)
Eosinophils Relative: 2 %
HCT: 37.8 % (ref 36.0–46.0)
Hemoglobin: 12.1 g/dL (ref 12.0–15.0)
Immature Granulocytes: 0 %
Lymphocytes Relative: 20 %
Lymphs Abs: 0.8 K/uL (ref 0.7–4.0)
MCH: 31 pg (ref 26.0–34.0)
MCHC: 32 g/dL (ref 30.0–36.0)
MCV: 96.9 fL (ref 80.0–100.0)
Monocytes Absolute: 0.4 K/uL (ref 0.1–1.0)
Monocytes Relative: 10 %
Neutro Abs: 2.7 K/uL (ref 1.7–7.7)
Neutrophils Relative %: 67 %
Platelets: 171 K/uL (ref 150–400)
RBC: 3.9 MIL/uL (ref 3.87–5.11)
RDW: 13.3 % (ref 11.5–15.5)
WBC: 4 K/uL (ref 4.0–10.5)
nRBC: 0 % (ref 0.0–0.2)

## 2018-04-04 MED ORDER — ACETAMINOPHEN 325 MG PO TABS
650.0000 mg | ORAL_TABLET | Freq: Once | ORAL | Status: AC
  Administered 2018-04-04: 650 mg via ORAL

## 2018-04-04 MED ORDER — FAMOTIDINE IN NACL 20-0.9 MG/50ML-% IV SOLN
INTRAVENOUS | Status: AC
  Filled 2018-04-04: qty 50

## 2018-04-04 MED ORDER — ACETAMINOPHEN 325 MG PO TABS
ORAL_TABLET | ORAL | Status: AC
  Filled 2018-04-04: qty 2

## 2018-04-04 MED ORDER — FAMOTIDINE IN NACL 20-0.9 MG/50ML-% IV SOLN
20.0000 mg | Freq: Two times a day (BID) | INTRAVENOUS | Status: DC
  Administered 2018-04-04: 20 mg via INTRAVENOUS

## 2018-04-04 MED ORDER — SODIUM CHLORIDE 0.9 % IV SOLN
375.0000 mg/m2 | Freq: Once | INTRAVENOUS | Status: AC
  Administered 2018-04-04: 600 mg via INTRAVENOUS
  Filled 2018-04-04: qty 10

## 2018-04-04 MED ORDER — SODIUM CHLORIDE 0.9 % IV SOLN
Freq: Once | INTRAVENOUS | Status: AC
  Administered 2018-04-04: 10:00:00 via INTRAVENOUS
  Filled 2018-04-04: qty 250

## 2018-04-04 NOTE — Assessment & Plan Note (Signed)
She will complete last treatment of rituximab today I plan to order CT imaging next month for objective response to therapy.

## 2018-04-04 NOTE — Progress Notes (Signed)
[facility] OFFICE PROGRESS NOTE  Patient Care Team: Unk Pinto, MD as PCP - General (Internal Medicine) Lafayette Dragon, MD (Inactive) as Consulting Physician (Gastroenterology) Loletha Carrow, MD (Pulmonary Disease) Heath Lark, MD as Consulting Physician (Hematology and Oncology) Rozetta Nunnery, MD as Consulting Physician (Otolaryngology) Chales Salmon, OD as Consulting Physician (Optometry)  ASSESSMENT & PLAN:  Follicular lymphoma grade iiia, lymph nodes of head, face, and neck [facility]) She will complete last treatment of rituximab today I plan to order CT imaging next month for objective response to therapy.  CKD (chronic kidney disease) stage 3, GFR 30-59 ml/min (HCC) She has stable kidney function.  Observe only.  Hypertension She has intermittent elevated blood pressure.  Continue close monitoring.   Orders Placed This Encounter  Procedures  . CT CHEST W CONTRAST    Standing Status:   Future    Standing Expiration Date:   04/05/2019    Order Specific Question:   If indicated for the ordered procedure, I authorize the administration of contrast media per Radiology protocol    Answer:   Yes    Order Specific Question:   Preferred imaging location?    Answer:   [facility]    Order Specific Question:   Radiology Contrast Protocol - do NOT remove file path    Answer:   \\[email or web address]  . CT ABDOMEN PELVIS W CONTRAST    Standing Status:   Future    Standing Expiration Date:   04/05/2019    Order Specific Question:   If indicated for the ordered procedure, I authorize the administration of contrast media per Radiology protocol    Answer:   Yes    Order Specific Question:   Preferred imaging location?    Answer:   [facility]    Order Specific Question:   Radiology Contrast Protocol - do NOT remove file path    Answer:   \\[email or web address]    INTERVAL HISTORY: Please see  below for problem oriented charting. She returns for further follow-up She had recent left hip fracture secondary to accidental injury She recovered fully Denies recent infection, fever or chills No recent cough.  No new lymphadenopathy.  Denies infusion reactions  SUMMARY OF ONCOLOGIC HISTORY:   Follicular lymphoma grade iiia, lymph nodes of head, face, and neck ([place])   01/29/2016 Imaging    Ct neck showed right submandibular lymphadenopathy and asymmetric prominence of jugular chain nodes. Metastatic disease is the primary diagnostic concern and submandibular node sampling is recommended. No primary lesion is seen, limited at the oral cavity due to dental artifact. 2. Emphysema    02/04/2016 Pathology Results    Accession: [id number] Biopsy showed high grade follicular lymphoma    39/76/7341 PET scan    Hypermetabolic right submandibular and right level II/III lymph nodes, consistent with the given history of lymphoma. No hypermetabolic lymph nodes in the chest, abdomen or pelvis. Aortic atherosclerosis (ICD10-170.0). Coronary artery calcification. Emphysema (ICD10-J43.9).    02/24/2016 Imaging    ECHO showed LV The cavity size was normal. Systolic function was   normal. The estimated ejection fraction was in the range of 55% to 60%. Wall motion was normal; there were no regional wall motion abnormalities. Doppler parameters are consistent with abnormal left ventricular relaxation (grade 1 diastolic dysfunction).    03/13/2016 - 04/24/2016 Chemotherapy    The patient had R-CHOP chemo x 3 cycles    05/27/2016 PET scan    No metabolic activity associated with the RIGHT  submandibular gland or RIGHT cervical lymph nodes consistent with a complete response. 2. Bilateral pulmonary nodules are not changed from comparison exam. 3. Diffuse mild esophagus metabolic activity suggests mild esophagitis.     REVIEW OF SYSTEMS:   Constitutional: Denies fevers, chills or abnormal weight loss Eyes:  Denies blurriness of vision Ears, nose, mouth, throat, and face: Denies mucositis or sore throat Respiratory: Denies cough, dyspnea or wheezes Cardiovascular: Denies palpitation, chest discomfort or lower extremity swelling Gastrointestinal:  Denies nausea, heartburn or change in bowel habits Skin: Denies abnormal skin rashes Lymphatics: Denies new lymphadenopathy or easy bruising Neurological:Denies numbness, tingling or new weaknesses Behavioral/Psych: Mood is stable, no new changes  All other systems were reviewed with the patient and are negative.  I have reviewed the past medical history, past surgical history, social history and family history with the patient and they are unchanged from previous note.  ALLERGIES:  has No Known Allergies.  MEDICATIONS:  Current Outpatient Medications  Medication Sig Dispense Refill  . alendronate (FOSAMAX) 70 MG tablet Take 1 tablet weekly with a full glass of water for 30 minutes on an empty stomach 12 tablet 3  . aspirin EC 81 MG tablet Take 81 mg by mouth daily.    . Coenzyme Q10 10 MG capsule Take 10 mg by mouth 2 (two) times daily.    Marland Kitchen gabapentin (NEURONTIN) 300 MG capsule TAKE 1 CAPSULE BY MOUTH 3 TO 4 TIMES DAILY FOR PAIN (Patient taking differently: Take 600 mg by mouth 2 (two) times daily. ) 360 capsule 1  . Magnesium 250 MG TABS Take 250 mg by mouth daily.     Marland Kitchen omeprazole (PRILOSEC) 40 MG capsule TAKE ONE CAPSULE BY MOUTH ONCE DAILY FOR  ACID  REFLUX 90 capsule 1  . potassium chloride (K-DUR,KLOR-CON) 10 MEQ tablet Take 10-20 mEq by mouth See admin instructions. 40mEq in the AM and 70mEq in the PM    . rosuvastatin (CRESTOR) 40 MG tablet Take 1/2 to 1 tablet daily or as directed for Cholesterol 30 tablet 5  . Tiotropium Bromide-Olodaterol (STIOLTO RESPIMAT) 2.5-2.5 MCG/ACT AERS Inhale 2 Act into the lungs daily. 12 g 4  . Vinpocetine POWD Take 10 mcg by mouth daily. Capsule form    . vitamin C (ASCORBIC ACID) 500 MG tablet Take 500 mg by  mouth daily.     No current facility-administered medications for this visit.     PHYSICAL EXAMINATION: ECOG PERFORMANCE STATUS: 1 - Symptomatic but completely ambulatory  Vitals:   04/04/18 0900  BP: (!) 148/72  Pulse: 82  Resp: 18  Temp: 98.2 F (36.8 C)  SpO2: 100%   Filed Weights   04/04/18 0900  Weight: 113 lb 12.8 oz (51.6 kg)    GENERAL:alert, no distress and comfortable SKIN: skin color, texture, turgor are normal, no rashes or significant lesions EYES: normal, Conjunctiva are pink and non-injected, sclera clear OROPHARYNX:no exudate, no erythema and lips, buccal mucosa, and tongue normal  NECK: supple, thyroid normal size, non-tender, without nodularity LYMPH:  no palpable lymphadenopathy in the cervical, axillary or inguinal LUNGS: clear to auscultation and percussion with normal breathing effort HEART: regular rate & rhythm and no murmurs and no lower extremity edema ABDOMEN:abdomen soft, non-tender and normal bowel sounds Musculoskeletal:no cyanosis of digits and no clubbing  NEURO: alert & oriented x 3 with fluent speech, no focal motor/sensory deficits  LABORATORY DATA:  I have reviewed the data as listed    Component Value Date/Time   NA 145 02/11/2018  0834   NA 142 02/12/2017 0809   K 3.7 02/11/2018 0834   K 4.0 02/12/2017 0809   CL 106 02/11/2018 0834   CO2 28 02/11/2018 0834   CO2 28 02/12/2017 0809   GLUCOSE 97 02/11/2018 0834   GLUCOSE 97 02/12/2017 0809   BUN 12 02/11/2018 0834   BUN 11.2 02/12/2017 0809   CREATININE 1.01 (H) 02/11/2018 0834   CREATININE 0.94 (H) 01/11/2018 1506   CREATININE 0.9 02/12/2017 0809   CALCIUM 8.9 02/11/2018 0834   CALCIUM 9.3 02/12/2017 0809   PROT 6.5 02/11/2018 0834   PROT 6.7 02/12/2017 0809   ALBUMIN 3.5 02/11/2018 0834   ALBUMIN 3.8 02/12/2017 0809   AST 12 (L) 02/11/2018 0834   AST 16 02/12/2017 0809   ALT 9 02/11/2018 0834   ALT 15 02/12/2017 0809   ALKPHOS 81 02/11/2018 0834   ALKPHOS 88  02/12/2017 0809   BILITOT 0.3 02/11/2018 0834   BILITOT 0.40 02/12/2017 0809   GFRNONAA 53 (L) 02/11/2018 0834   GFRNONAA 59 (L) 01/11/2018 1506   GFRAA >60 02/11/2018 0834   GFRAA 69 01/11/2018 1506    No results found for: SPEP, UPEP  Lab Results  Component Value Date   WBC 4.0 04/04/2018   NEUTROABS 2.7 04/04/2018   HGB 12.1 04/04/2018   HCT 37.8 04/04/2018   MCV 96.9 04/04/2018   PLT 171 04/04/2018      Chemistry      Component Value Date/Time   NA 145 02/11/2018 0834   NA 142 02/12/2017 0809   K 3.7 02/11/2018 0834   K 4.0 02/12/2017 0809   CL 106 02/11/2018 0834   CO2 28 02/11/2018 0834   CO2 28 02/12/2017 0809   BUN 12 02/11/2018 0834   BUN 11.2 02/12/2017 0809   CREATININE 1.01 (H) 02/11/2018 0834   CREATININE 0.94 (H) 01/11/2018 1506   CREATININE 0.9 02/12/2017 0809      Component Value Date/Time   CALCIUM 8.9 02/11/2018 0834   CALCIUM 9.3 02/12/2017 0809   ALKPHOS 81 02/11/2018 0834   ALKPHOS 88 02/12/2017 0809   AST 12 (L) 02/11/2018 0834   AST 16 02/12/2017 0809   ALT 9 02/11/2018 0834   ALT 15 02/12/2017 0809   BILITOT 0.3 02/11/2018 0834   BILITOT 0.40 02/12/2017 0809     All questions were answered. The patient knows to call the clinic with any problems, questions or concerns. No barriers to learning was detected.  I spent 15 minutes counseling the patient face to face. The total time spent in the appointment was 20 minutes and more than 50% was on counseling and review of test results  Heath Lark, MD 04/04/2018 9:14 AM

## 2018-04-04 NOTE — Telephone Encounter (Signed)
Gave avs and calendar ° °

## 2018-04-04 NOTE — Assessment & Plan Note (Signed)
She has stable kidney function.  Observe only.

## 2018-04-04 NOTE — Patient Instructions (Signed)
[facility] Discharge Instructions for Patients Receiving Chemotherapy  Today you received the following chemotherapy agents rituxan  To help prevent nausea and vomiting after your treatment, we encourage you to take your nausea medication as directed.   If you develop nausea and vomiting that is not controlled by your nausea medication, call the clinic.   BELOW ARE SYMPTOMS THAT SHOULD BE REPORTED IMMEDIATELY:  *FEVER GREATER THAN 100.5 F  *CHILLS WITH OR WITHOUT FEVER  NAUSEA AND VOMITING THAT IS NOT CONTROLLED WITH YOUR NAUSEA MEDICATION  *UNUSUAL SHORTNESS OF BREATH  *UNUSUAL BRUISING OR BLEEDING  TENDERNESS IN MOUTH AND THROAT WITH OR WITHOUT PRESENCE OF ULCERS  *URINARY PROBLEMS  *BOWEL PROBLEMS  UNUSUAL RASH Items with * indicate a potential emergency and should be followed up as soon as possible.  Feel free to call the clinic should you have any questions or concerns. The clinic phone number is [phone].  Please show the CHEMO ALERT CARD at check-in to the Emergency Department and triage nurse.   

## 2018-04-04 NOTE — Assessment & Plan Note (Signed)
She has intermittent elevated blood pressure.  Continue close monitoring.

## 2018-04-12 ENCOUNTER — Other Ambulatory Visit: Payer: Medicare HMO

## 2018-04-12 ENCOUNTER — Ambulatory Visit: Payer: Medicare HMO

## 2018-04-12 ENCOUNTER — Ambulatory Visit: Payer: Medicare HMO | Admitting: Hematology and Oncology

## 2018-04-20 ENCOUNTER — Ambulatory Visit (INDEPENDENT_AMBULATORY_CARE_PROVIDER_SITE_OTHER): Payer: Medicare HMO | Admitting: Orthopaedic Surgery

## 2018-04-20 ENCOUNTER — Encounter (INDEPENDENT_AMBULATORY_CARE_PROVIDER_SITE_OTHER): Payer: Self-pay | Admitting: Orthopaedic Surgery

## 2018-04-20 ENCOUNTER — Ambulatory Visit (INDEPENDENT_AMBULATORY_CARE_PROVIDER_SITE_OTHER): Payer: Medicare HMO

## 2018-04-20 DIAGNOSIS — S72002A Fracture of unspecified part of neck of left femur, initial encounter for closed fracture: Secondary | ICD-10-CM | POA: Diagnosis not present

## 2018-04-20 DIAGNOSIS — M25552 Pain in left hip: Secondary | ICD-10-CM | POA: Diagnosis not present

## 2018-04-20 NOTE — Progress Notes (Signed)
Office Visit Note   Patient: Renee Hawkins           Date of Birth: January 31, 1941           MRN: [id number] Visit Date: 04/20/2018              Requested by: Unk Pinto, [address] [place], [place] [address] PCP: Unk Pinto, MD   Assessment & Plan: Visit Diagnoses:  1. Left displaced femoral neck fracture (HCC)   2. Pain in left hip     Plan: Patient is doing very well at this point.  She has returned back to her baseline level of activity.  She would prefer to just follow-up as needed because she would rather not have to pay the co-pay if she is doing all right.  Dental prophylaxis was discussed.  Follow-up as needed.  Follow-Up Instructions: Return if symptoms worsen or fail to improve.   Orders:  Orders Placed This Encounter  Procedures  . XR HIP UNILAT W OR W/O PELVIS 2-3 VIEWS LEFT   No orders of the defined types were placed in this encounter.     Procedures: No procedures performed   Clinical Data: No additional findings.   Subjective: Chief Complaint  Patient presents with  . Left Hip - Pain    Manus Gunning is 6 months status post left total hip replacement for femoral neck fracture.  She is doing well.  She has returned back to her normal activity level and function.  She reports no pain.  She just reports some numbness in her left thigh distal to the surgical scar.   Review of Systems   Objective: Vital Signs: There were no vitals taken for this visit.  Physical Exam  Ortho Exam Left hip exam is unremarkable.  Fully healed surgical scar. Specialty Comments:  No specialty comments available.  Imaging: Xr Hip Unilat W Or W/o Pelvis 2-3 Views Left  Result Date: 04/20/2018 Stable left total hip replacement.  No complications.    PMFS History: Patient Active Problem List   Diagnosis Date Noted  . Left displaced femoral neck fracture ([place]) 01/18/2018  . Lumbar radiculopathy 01/18/2018  . Aortic atherosclerosis  ([place]) 01/10/2018  . Anemia, chronic disease 12/13/2017  . H/O total knee replacement, left 11/05/2017  . Closed right hip fracture, sequela 10/20/2017  . Pain of left scapula 10/12/2017  . Hypokalemia 05/28/2016  . Follicular lymphoma grade iiia, lymph nodes of head, face, and neck ([place]) 02/11/2016  . Encounter for antineoplastic chemotherapy 02/11/2016  . OSA and COPD overlap syndrome ([place]) 12/05/2014  . Osteoporosis 12/05/2014  . Medication management 03/06/2014  . CKD (chronic kidney disease) stage 3, GFR 30-59 ml/min (HCC) 11/27/2013  . COPD (chronic obstructive pulmonary disease) with emphysema ([place]) 11/27/2013  . Hypertension   . GERD    . Hx of adenomatous colonic polyps   . Hyperlipidemia   . Other abnormal glucose   . Vitamin D deficiency   . RLS (restless legs syndrome)   . Vitamin B12 deficiency   . Anxiety    Past Medical History:  Diagnosis Date  . Anxiety   . COPD (chronic obstructive pulmonary disease) ([place])   . Diverticulosis   . Diverticulosis of colon with hemorrhage   . GERD (gastroesophageal reflux disease)   . Hx of adenomatous colonic polyps   . Hyperlipidemia   . Hypertension   . Hypomagnesemia   . Neuropathy   . Peripheral neuropathy   . Prediabetes   .  RLS (restless legs syndrome)   . Sleep apnea    CPAP  . Vitamin B12 deficiency   . Vitamin D deficiency     Family History  Problem Relation Age of Onset  . Stroke Mother   . Stroke Maternal Grandmother   . Colon cancer Neg Hx     Past Surgical History:  Procedure Laterality Date  . APPENDECTOMY    . CATARACT EXTRACTION, BILATERAL  2017   Bilateral cataract Dr. Bing Plume  . COLONOSCOPY N/A 05/18/2014   Procedure: COLONOSCOPY;  Surgeon: Lafayette Dragon, MD;  Location: [facility];  Service: Endoscopy;  Laterality: N/A;  . DIRECT LARYNGOSCOPY Right 02/04/2016   Procedure: DIRECT LARYNGOSCOPY;  Surgeon: Rozetta Nunnery, MD;  Location: [facility];  Service: ENT;  Laterality:  Right;  . LYMPH NODE BIOPSY Right 02/04/2016   Procedure: EXCISIONAL BIOPSY RIGHT NECK LYMPH NODE;  Surgeon: Rozetta Nunnery, MD;  Location: [facility];  Service: ENT;  Laterality: Right;  . TONSILLECTOMY    . TOTAL HIP ARTHROPLASTY Left 10/21/2017   Procedure: TOTAL HIP ARTHROPLASTY ANTERIOR APPROACH;  Surgeon: Leandrew Koyanagi, MD;  Location: [facility];  Service: Orthopedics;  Laterality: Left;   Social History   Occupational History  . Occupation: Retired     Fish farm manager: RETIRED  Tobacco Use  . Smoking status: Former Smoker    Packs/day: 1.00    Years: 30.00    Pack years: 30.00    Types: Cigarettes    Last attempt to quit: 04/06/2012    Years since quitting: 6.0  . Smokeless tobacco: Never Used  Substance and Sexual Activity  . Alcohol use: No  . Drug use: No  . Sexual activity: Not Currently

## 2018-04-25 ENCOUNTER — Encounter: Payer: Self-pay | Admitting: Physician Assistant

## 2018-04-25 NOTE — Progress Notes (Signed)
Medicare wellness visit and follow up.   Assessment:   Encounter for annual medicare wellness visit  Atherosclerosis of aorta Control blood pressure, cholesterol, glucose, increase exercise.   Essential hypertension - continue medications, DASH diet, exercise and monitor at home. Call if greater than 130/80.  - CBC with Differential/Platelet - CMP/GFR - TSH  OSA with COPD overlap Sleep apnea- continue CPAP, weight loss advised.   Pulmonary emphysema, unspecified emphysema type Follow up pulmonary, continue inhaler.   Follicular lymphoma S/p treatment, followed by Dr. Alvy Bimler  Other abnormal glucose  Recent A1Cs at goal Discussed diet/exercise, weight management  Defer A1C; check CMP  CKD (chronic kidney disease) stage 3 Increase fluids, avoid NSAIDS, monitor sugars, will monitor - CMP WITH GFR  Hyperlipidemia -continue medications, check lipids, decrease fatty foods, increase activity.  - Lipid panel  Vitamin D deficiency - Vit D  25 hydroxy (rtn osteoporosis monitoring)   Medication management - Magnesium   Hypomagnesemia  Vitamin B12 deficiency Continue supplement  Gastroesophageal reflux disease with esophagitis Continue PPI/H2 blocker, diet discussed   RLS (restless legs syndrome) Exercise   Hx of adenomatous colonic polyps Up to date  Anxiety remission   Osteoporosis Will refer for possible prolia start  Future Appointments  Date Time Provider [place]  05/05/2018 11:00 AM [facility] 1 [facility] None  05/05/2018 12:00 PM [facility]-CT 2 [facility] [place]  05/06/2018  9:15 AM Heath Lark, MD [facility] None  08/10/2018  3:45 PM Unk Pinto, MD [facility] None  05/04/2019  3:00 PM Vicie Mutters, PA-C [facility] None     Plan:   During the course of the visit the patient was educated and counseled about appropriate screening and preventive services including:    Pneumococcal vaccine   Influenza vaccine  Td  vaccine  Screening electrocardiogram  Screening mammography  Bone densitometry screening  Colorectal cancer screening  Diabetes screening  Glaucoma screening  Nutrition counseling   .  Subjective:   Renee Hawkins is a 78 y.o. female who presents for Medicare Annual Wellness Visit and follow up.   Patient is s/p treatment follicular lymphoma, right neck, following oncology Dr. Alvy Bimler. Finishing up with treatment over the next few months and doing very well.  Dr. Felix Ahmadi, OD earlier this year had abnormal left optic nerve, had negative CBC, ESR, CRP and negative MR of brain and orbits.   She has COPD with OSA, on CPAP and respimet, wears CPAP nightly, 6-8 hours, states it is helping with breathing and energy, well controlled. Follows with Dr. Welford Roche PRN.    Had Dexa 04/2017, went from -3.5 to -3.2, on fosamax x 2016.   She is on potassium twice a day or 27mq total.   BMI is Body mass index is 21.23 kg/m., she has been working on diet and exercise. Wt Readings from Last 3 Encounters:  04/27/18 114 lb 3.2 oz (51.8 kg)  04/04/18 113 lb 12.8 oz (51.6 kg)  02/11/18 110 lb 8 oz (50.1 kg)   Her blood pressure has been controlled at home, today their BP is BP: 136/72 She does workout, still at [employer].  She denies chest pain, shortness of breath, dizziness.   She is on cholesterol medication, lipitor '80mg'$  1/2 pill 3 days a week and denies myalgias. Her cholesterol is at goal. The cholesterol last visit was:   Lab Results  Component Value Date   CHOL 250 (H) 04/27/2018   HDL 49 (L) 04/27/2018   LDLCALC 164 (H) 04/27/2018  TRIG 209 (H) 04/27/2018   CHOLHDL 5.1 (H) 04/27/2018   Lab Results  Component Value Date   GFRNONAA 57 (L) 04/27/2018   She has been working on diet and exercise for glucose management, and denies nausea, paresthesia of the feet and polydipsia. Last A1C in the office was:  Lab Results  Component Value Date   HGBA1C 5.2  04/27/2018   Patient is on Vitamin D supplement.   Lab Results  Component Value Date   VD25OH 77 08/18/2017     Lab Results  Component Value Date   GFRNONAA 67 (L) 04/27/2018      Medication Review  Current Outpatient Medications (Endocrine & Metabolic):  .  alendronate (FOSAMAX) 70 MG tablet, Take 1 tablet weekly with a full glass of water for 30 minutes on an empty stomach  Current Outpatient Medications (Cardiovascular):  .  rosuvastatin (CRESTOR) 40 MG tablet, Take 1/2 to 1 tablet daily or as directed for Cholesterol  Current Outpatient Medications (Respiratory):  Marland Kitchen  Tiotropium Bromide-Olodaterol (STIOLTO RESPIMAT) 2.5-2.5 MCG/ACT AERS, Inhale 2 Act into the lungs daily. .  Fluticasone-Umeclidin-Vilant (TRELEGY ELLIPTA) 100-62.5-25 MCG/INH AEPB, Inhale 100 mcg into the lungs daily.  Current Outpatient Medications (Analgesics):  .  aspirin EC 81 MG tablet, Take 81 mg by mouth daily.   Current Outpatient Medications (Other):  Marland Kitchen  Coenzyme Q10 10 MG capsule, Take 10 mg by mouth 2 (two) times daily. Marland Kitchen  gabapentin (NEURONTIN) 300 MG capsule, TAKE 1 CAPSULE BY MOUTH 3 TO 4 TIMES DAILY FOR PAIN (Patient taking differently: Take 600 mg by mouth 2 (two) times daily. ) .  Magnesium 250 MG TABS, Take 250 mg by mouth daily.  Marland Kitchen  omeprazole (PRILOSEC) 40 MG capsule, TAKE ONE CAPSULE BY MOUTH ONCE DAILY FOR  ACID  REFLUX .  potassium chloride (K-DUR,KLOR-CON) 10 MEQ tablet, Take 10-20 mEq by mouth See admin instructions. 90mq in the AM and 266m in the PM .  Vinpocetine POWD, Take 10 mcg by mouth daily. Capsule form .  vitamin C (ASCORBIC ACID) 500 MG tablet, Take 500 mg by mouth daily.  Current Problems (verified) Patient Active Problem List   Diagnosis Date Noted  . Left displaced femoral neck fracture ([facility] [place]/15/2019  . Lumbar radiculopathy 01/18/2018  . Aortic atherosclerosis ([facility]CC[facility]10/10/2017  . Anemia, chronic disease 12/13/2017  . Follicular lymphoma grade iiia, lymph nodes  of head, face, and neck ([facility]CT[facility]11/10/2015  . OSA and COPD overlap syndrome ([facility]CPorter08/31/2016  . Osteoporosis 12/05/2014  . Medication management 03/06/2014  . CKD (chronic kidney disease) stage 3, GFR 30-59 ml/min (HCC) 11/27/2013  . COPD (chronic obstructive pulmonary disease) with emphysema (HCCastaic08/24/2015  . Hypertension   . GERD    . Hx of adenomatous colonic polyps   . Hyperlipidemia   . Other abnormal glucose   . Vitamin D deficiency   . RLS (restless legs syndrome)   . Vitamin B12 deficiency   . Anxiety     Screening Tests Immunization History  Administered Date(s) Administered  . Influenza Split 01/18/2013  . Influenza, High Dose Seasonal PF 12/05/2014, 01/23/2016, 12/22/2016  . Pneumococcal Conjugate-13 11/27/2013  . Pneumococcal Polysaccharide-23 11/19/2011  . Td 08/13/2008  . Zoster 04/06/2010   Preventative care: Last colonoscopy: 05/2014 Last mammogram: 04/2017 Last pap smear/pelvic exam: Done per GYN DEXA:02/2015 Started fosamax 2016, 04/2017 T -3.2 CT chest 2015, stable pulmonary nodule CT neck 01/29/2016 Echo 02/24/2016  Prior vaccinations: Tdap: 08/2008 Influenza: 2018, out in office Pneumonia: 2013 Prevnar: 2015  Shingles: 2012  Names of Other Physician/Practitioners you currently use: 1. [facility]- here for primary care 2. [place] eye doctor, 2019 3. dentist, last visit as needed 2017, needs to schedule  Patient Care Team: Unk Pinto, MD as PCP - General (Internal Medicine) [place] Dragon, MD (Inactive) as Consulting Physician (Gastroenterology) Loletha Carrow, MD (Pulmonary Disease) Heath Lark, MD as Consulting Physician (Hematology and Oncology) Rozetta Nunnery, MD as Consulting Physician (Otolaryngology) Chales Salmon, OD as Consulting Physician (Optometry)   Allergies No Known Allergies  SURGICAL HISTORY She  has a past surgical history that includes Appendectomy;  Tonsillectomy; Colonoscopy (N/A, 05/18/2014); Lymph node biopsy (Right, 02/04/2016); Direct laryngoscopy (Right, 02/04/2016); Total hip arthroplasty (Left, 10/21/2017); and Cataract extraction, bilateral (2017). FAMILY HISTORY Her family history includes Stroke in her maternal grandmother and mother. SOCIAL HISTORY She  reports that she quit smoking about 6 years ago. Her smoking use included cigarettes. She has a 30.00 pack-year smoking history. She has never used smokeless tobacco. She reports that she does not drink alcohol or use drugs.  MEDICARE WELLNESS OBJECTIVES: Physical activity: Current Exercise Habits: Home exercise routine, Type of exercise: yoga;walking Cardiac risk factors: Cardiac Risk Factors include: advanced age (>47mn, >>84women);dyslipidemia Depression/mood screen:   Dep[facility] 04/27/2018  Decreased Interest 0  Down, Depressed, Hopeless 0  PHQ - 2 Score 0    ADLs:  In your present state of health, do you have any difficulty performing the following activities: 04/27/2018 01/11/2018  Hearing? N N  Vision? N N  Difficulty concentrating or making decisions? N N  Walking or climbing stairs? N N  Comment - -  Dressing or bathing? N N  Doing errands, shopping? N N  Some recent data might be hidden    Cognitive Testing  Alert? Yes  Normal Appearance?Yes  Oriented to person? Yes  Place? Yes   Time? Yes  Recall of three objects?  2/3  Can perform simple calculations? Yes  Displays appropriate judgment?Yes  Can read the correct time from a watch face?Yes  EOL planning: Does Patient Have a Medical Advance Directive?: Yes Type of Advance Directive: Healthcare Power of Attorney, Living will Does patient want to make changes to medical advance directive?: No - Patient declined  Review of Systems  Constitutional: Negative.   HENT: Negative.        + Snoring, better on CPAP  Respiratory: Positive for shortness of breath (improved with CPAP, not using O2  anymore). Negative for cough, hemoptysis, sputum production and wheezing.   Cardiovascular: Negative.   Gastrointestinal: Negative for abdominal pain, blood in stool, constipation, diarrhea, heartburn, melena, nausea and vomiting.  Genitourinary: Negative for dysuria, flank pain, frequency, hematuria and urgency.  Musculoskeletal: Negative.  Negative for falls.  Skin: Negative.   Neurological: Positive for sensory change (bilateral legs). Negative for dizziness, tingling, tremors, speech change, focal weakness, seizures and loss of consciousness.  Psychiatric/Behavioral: Negative.  Negative for depression.    Objective:     Blood pressure 136/72, pulse 86, temperature (!) 97.4 F (36.3 C), height 5' 1.5" (1.562 m), weight 114 lb 3.2 oz (51.8 kg), SpO2 96 %. Body mass index is 21.23 kg/m.  General appearance: alert, no distress, WD/WN,  female HEENT: normocephalic, sclerae anicteric, TMs pearly, nares patent, no discharge or erythema, pharynx normal Oral cavity: MMM, no lesions Neck: supple,  no thyromegaly, no masses Heart: RRR, normal S1, S2, no murmurs Lungs: CTA bilaterally, no wheezes, rhonchi,  or rales Abdomen: +bs, soft, non tender, no rebound, distended, no masses, no hepatomegaly, no splenomegaly Musculoskeletal: nontender, no swelling, no obvious deformity Extremities: no edema, no cyanosis, no clubbing Pulses: 2+ symmetric, upper and lower extremities, normal cap refill Neurological: alert, oriented x 3, CN2-12 intact, strength normal upper extremities and lower extremities, sensation normal throughout, DTRs 2+ throughout, no cerebellar signs, gait normal Psychiatric: normal affect, behavior normal, pleasant  Breast: defer Gyn: defer  Rectal:   Medicare Attestation I have personally reviewed: The patient's medical and social history Their use of alcohol, tobacco or illicit drugs Their current medications and supplements The patient's functional ability including  ADLs,fall risks, home safety risks, cognitive, and hearing and visual impairment Diet and physical activities Evidence for depression or mood disorders  The patient's weight, height, BMI, and visual acuity have been recorded in the chart.  I have made referrals, counseling, and provided education to the patient based on review of the above and I have provided the patient with a written personalized care plan for preventive services.     Vicie Mutters, PA-C   04/28/2018

## 2018-04-27 ENCOUNTER — Ambulatory Visit (INDEPENDENT_AMBULATORY_CARE_PROVIDER_SITE_OTHER): Payer: Medicare HMO | Admitting: Physician Assistant

## 2018-04-27 ENCOUNTER — Encounter: Payer: Self-pay | Admitting: Physician Assistant

## 2018-04-27 VITALS — BP 136/72 | HR 86 | Temp 97.4°F | Ht 61.5 in | Wt 114.2 lb

## 2018-04-27 DIAGNOSIS — G4733 Obstructive sleep apnea (adult) (pediatric): Secondary | ICD-10-CM | POA: Diagnosis not present

## 2018-04-27 DIAGNOSIS — Z0001 Encounter for general adult medical examination with abnormal findings: Secondary | ICD-10-CM

## 2018-04-27 DIAGNOSIS — I700 Atherosclerosis of aorta: Secondary | ICD-10-CM | POA: Diagnosis not present

## 2018-04-27 DIAGNOSIS — E559 Vitamin D deficiency, unspecified: Secondary | ICD-10-CM

## 2018-04-27 DIAGNOSIS — C8231 Follicular lymphoma grade IIIa, lymph nodes of head, face, and neck: Secondary | ICD-10-CM | POA: Diagnosis not present

## 2018-04-27 DIAGNOSIS — J439 Emphysema, unspecified: Secondary | ICD-10-CM

## 2018-04-27 DIAGNOSIS — E538 Deficiency of other specified B group vitamins: Secondary | ICD-10-CM | POA: Diagnosis not present

## 2018-04-27 DIAGNOSIS — Z79899 Other long term (current) drug therapy: Secondary | ICD-10-CM | POA: Diagnosis not present

## 2018-04-27 DIAGNOSIS — F419 Anxiety disorder, unspecified: Secondary | ICD-10-CM

## 2018-04-27 DIAGNOSIS — E785 Hyperlipidemia, unspecified: Secondary | ICD-10-CM

## 2018-04-27 DIAGNOSIS — K219 Gastro-esophageal reflux disease without esophagitis: Secondary | ICD-10-CM

## 2018-04-27 DIAGNOSIS — R7309 Other abnormal glucose: Secondary | ICD-10-CM

## 2018-04-27 DIAGNOSIS — N183 Chronic kidney disease, stage 3 unspecified: Secondary | ICD-10-CM

## 2018-04-27 DIAGNOSIS — J449 Chronic obstructive pulmonary disease, unspecified: Secondary | ICD-10-CM | POA: Diagnosis not present

## 2018-04-27 DIAGNOSIS — D638 Anemia in other chronic diseases classified elsewhere: Secondary | ICD-10-CM | POA: Diagnosis not present

## 2018-04-27 DIAGNOSIS — Z860101 Personal history of adenomatous and serrated colon polyps: Secondary | ICD-10-CM

## 2018-04-27 DIAGNOSIS — Z0000 Encounter for general adult medical examination without abnormal findings: Secondary | ICD-10-CM

## 2018-04-27 DIAGNOSIS — R6889 Other general symptoms and signs: Secondary | ICD-10-CM

## 2018-04-27 DIAGNOSIS — I10 Essential (primary) hypertension: Secondary | ICD-10-CM | POA: Diagnosis not present

## 2018-04-27 DIAGNOSIS — M816 Localized osteoporosis [Lequesne]: Secondary | ICD-10-CM

## 2018-04-27 DIAGNOSIS — Z86010 Personal history of colonic polyps: Secondary | ICD-10-CM

## 2018-04-27 DIAGNOSIS — M5416 Radiculopathy, lumbar region: Secondary | ICD-10-CM

## 2018-04-27 DIAGNOSIS — G2581 Restless legs syndrome: Secondary | ICD-10-CM

## 2018-04-27 MED ORDER — FLUTICASONE-UMECLIDIN-VILANT 100-62.5-25 MCG/INH IN AEPB: 100.0000 ug | INHALATION_SPRAY | Freq: Every day | RESPIRATORY_TRACT | 6 refills | Status: DC

## 2018-04-27 NOTE — Patient Instructions (Signed)
Stop the fosamax at the end of this year Need to discuss trying prolia With fracture and last DEXA, and patient has been on fosamax since 2016 We will sent to ortho for possible switch to prolia   Preventing Osteoporosis, Adult Osteoporosis is a condition that causes the bones to get weaker. With osteoporosis, the bones become thinner, and the normal spaces in bone tissue become larger. This can make the bones weak and cause them to break more easily. People who have osteoporosis are more likely to break their wrist, spine, or hip. Even a minor accident or injury can be enough to break weak bones. Osteoporosis can occur with aging. Your body constantly replaces old bone tissue with new tissue. As you get older, you may lose bone tissue more quickly, or it may be replaced more slowly. Osteoporosis is more likely to develop if you have poor nutrition or do not get enough calcium or vitamin D. Other lifestyle factors can also play a role. By making some diet and lifestyle changes, you can help to keep your bones healthy and help to prevent osteoporosis. What nutrition changes can be made? Nutrition plays an important role in maintaining healthy, strong bones.  Make sure you get enough calcium every day from food or from calcium supplements. ? If you are age 21 or younger, aim to get 1,000 mg of calcium every day. ? If you are older than age 17, aim to get 1,200 mg of calcium every day.  Try to get enough vitamin D every day. ? If you are age 60 or younger, aim to get 600 international units (IU) every day. ? If you are older than age 34, aim to get 800 international units every day.  Follow a healthy diet. Eat plenty of foods that contain calcium and vitamin D. ? Calcium is in milk, cheese, yogurt, and other dairy products. Some fish and vegetables are also good sources of calcium. Many foods such as cereals and breads have had calcium added to them (are fortified). Check nutrition labels to see  how much calcium is in a food or drink. ? Foods that contain vitamin D include milk, cereals, salmon, and tuna. Your body also makes vitamin D when you are out in the sun. Bare skin exposure to the sun on your face, arms, legs, or back for no more than 30 minutes a day, 2 times per week is more than enough. Beyond that, it is important to use sunblock to protect your skin from sunburn, which increases your risk for skin cancer. What lifestyle changes can be made? Making changes in your everyday life can also play an important role in preventing osteoporosis.  Stay active and get exercise every day. Ask your health care provider what types of exercise are best for you.  Do not use any products that contain nicotine or tobacco, such as cigarettes and e-cigarettes. If you need help quitting, ask your health care provider.  Limit alcohol intake to no more than 1 drink a day for nonpregnant women and 2 drinks a day for men. One drink equals 12 oz of beer, 5 oz of wine, or 1 oz of hard liquor. Why are these changes important? Making these nutrition and lifestyle changes can:  Help you develop and maintain healthy, strong bones.  Prevent loss of bone mass and the problems that are caused by that loss, such as broken bones and delayed healing.  Make you feel better mentally and physically. What can happen if changes  are not made? Problems that can result from osteoporosis can be very serious. These may include:  A higher risk of broken bones that are painful and do not heal well.  Physical malformations, such as a collapsed spine or a hunched back.  Problems with movement. Where to find support If you need help making changes to prevent osteoporosis, talk with your health care provider. You can ask for a referral to a diet and nutrition specialist (dietitian) and a physical therapist. Where to find more information Learn more about osteoporosis from:  NIH Osteoporosis and Related [place]: www.niams.GolfingGoddess.com.br  U.S. Office on Women's Health: SouvenirBaseball.es.html  National Osteoporosis Foundation: ProfilePeek.ch Summary  Osteoporosis is a condition that causes weak bones that are more likely to break.  Eating a healthy diet and making sure you get enough calcium and vitamin D can help prevent osteoporosis.  Other ways to reduce your risk of osteoporosis include getting regular exercise and avoiding alcohol and products that contain nicotine or tobacco. This information is not intended to replace advice given to you by your health care provider. Make sure you discuss any questions you have with your health care provider. Document Released: 04/07/2015 Document Revised: 12/29/2016 Document Reviewed: 12/02/2015 Elsevier Interactive Patient Education  2019 [employer].

## 2018-04-28 ENCOUNTER — Encounter: Payer: Self-pay | Admitting: Physician Assistant

## 2018-04-28 LAB — COMPLETE METABOLIC PANEL WITH GFR
AG Ratio: 1.8 (calc) (ref 1.0–2.5)
ALKALINE PHOSPHATASE (APISO): 75 U/L (ref 33–130)
ALT: 11 U/L (ref 6–29)
AST: 17 U/L (ref 10–35)
Albumin: 4.4 g/dL (ref 3.6–5.1)
BUN/Creatinine Ratio: 12 (calc) (ref 6–22)
BUN: 11 mg/dL (ref 7–25)
CHLORIDE: 101 mmol/L (ref 98–110)
CO2: 30 mmol/L (ref 20–32)
Calcium: 9.6 mg/dL (ref 8.6–10.4)
Creat: 0.95 mg/dL — ABNORMAL HIGH (ref 0.60–0.93)
GFR, Est African American: 68 mL/min/{1.73_m2} (ref >=60)
GFR, Est Non African American: 59 mL/min/{1.73_m2} — ABNORMAL LOW (ref >=60)
GLUCOSE: 88 mg/dL (ref 65–99)
Globulin: 2.4 g/dL (calc) (ref 1.9–3.7)
Potassium: 4.1 mmol/L (ref 3.5–5.3)
Sodium: 143 mmol/L (ref 135–146)
Total Bilirubin: 0.4 mg/dL (ref 0.2–1.2)
Total Protein: 6.8 g/dL (ref 6.1–8.1)

## 2018-04-28 LAB — HEMOGLOBIN A1C
Hgb A1c MFr Bld: 5.2 % of total Hgb (ref <5.7)
MEAN PLASMA GLUCOSE: 103 (calc)
eAG (mmol/L): 5.7 (calc)

## 2018-04-28 LAB — LIPID PANEL
CHOLESTEROL: 250 mg/dL — AB (ref <200)
HDL: 49 mg/dL — ABNORMAL LOW (ref >50)
LDL CHOLESTEROL (CALC): 164 mg/dL — AB
Non-HDL Cholesterol (Calc): 201 mg/dL (calc) — ABNORMAL HIGH (ref <130)
Total CHOL/HDL Ratio: 5.1 (calc) — ABNORMAL HIGH (ref <5.0)
Triglycerides: 209 mg/dL — ABNORMAL HIGH (ref <150)

## 2018-04-28 LAB — CBC WITH DIFFERENTIAL/PLATELET
Absolute Monocytes: 504 cells/uL (ref 200–950)
BASOS PCT: 0.4 %
Basophils Absolute: 18 cells/uL (ref 0–200)
EOS ABS: 171 {cells}/uL (ref 15–500)
Eosinophils Relative: 3.8 %
HCT: 37 % (ref 35.0–45.0)
HEMOGLOBIN: 12.6 g/dL (ref 11.7–15.5)
Lymphs Abs: 932 cells/uL (ref 850–3900)
MCH: 31.7 pg (ref 27.0–33.0)
MCHC: 34.1 g/dL (ref 32.0–36.0)
MCV: 93 fL (ref 80.0–100.0)
MONOS PCT: 11.2 %
MPV: 11.1 fL (ref 7.5–12.5)
NEUTROS ABS: 2876 {cells}/uL (ref 1500–7800)
Neutrophils Relative %: 63.9 %
PLATELETS: 184 10*3/uL (ref 140–400)
RBC: 3.98 10*6/uL (ref 3.80–5.10)
RDW: 12.9 % (ref 11.0–15.0)
TOTAL LYMPHOCYTE: 20.7 %
WBC: 4.5 10*3/uL (ref 3.8–10.8)

## 2018-04-28 LAB — MAGNESIUM: MAGNESIUM: 2 mg/dL (ref 1.5–2.5)

## 2018-04-28 LAB — TSH: TSH: 1.95 m[IU]/L (ref 0.40–4.50)

## 2018-05-02 DIAGNOSIS — J9690 Respiratory failure, unspecified, unspecified whether with hypoxia or hypercapnia: Secondary | ICD-10-CM | POA: Diagnosis not present

## 2018-05-02 DIAGNOSIS — I6789 Other cerebrovascular disease: Secondary | ICD-10-CM | POA: Diagnosis not present

## 2018-05-02 DIAGNOSIS — J449 Chronic obstructive pulmonary disease, unspecified: Secondary | ICD-10-CM | POA: Diagnosis not present

## 2018-05-02 DIAGNOSIS — G4733 Obstructive sleep apnea (adult) (pediatric): Secondary | ICD-10-CM | POA: Diagnosis not present

## 2018-05-02 DIAGNOSIS — R5381 Other malaise: Secondary | ICD-10-CM | POA: Diagnosis not present

## 2018-05-04 ENCOUNTER — Other Ambulatory Visit: Payer: Self-pay

## 2018-05-04 DIAGNOSIS — M549 Dorsalgia, unspecified: Secondary | ICD-10-CM

## 2018-05-04 MED ORDER — GABAPENTIN 300 MG PO CAPS: ORAL_CAPSULE | ORAL | 1 refills | Status: DC

## 2018-05-05 ENCOUNTER — Ambulatory Visit (HOSPITAL_COMMUNITY)
Admission: RE | Admit: 2018-05-05 | Discharge: 2018-05-05 | Disposition: A | Discharge status: Home / Self Care | Payer: Medicare HMO | Source: Ambulatory Visit | Attending: Hematology and Oncology | Admitting: Hematology and Oncology

## 2018-05-05 ENCOUNTER — Inpatient Hospital Stay: Payer: Medicare HMO | Attending: Hematology and Oncology

## 2018-05-05 DIAGNOSIS — K449 Diaphragmatic hernia without obstruction or gangrene: Secondary | ICD-10-CM | POA: Diagnosis not present

## 2018-05-05 DIAGNOSIS — Z7982 Long term (current) use of aspirin: Secondary | ICD-10-CM | POA: Insufficient documentation

## 2018-05-05 DIAGNOSIS — Z79899 Other long term (current) drug therapy: Secondary | ICD-10-CM | POA: Diagnosis not present

## 2018-05-05 DIAGNOSIS — C8231 Follicular lymphoma grade IIIa, lymph nodes of head, face, and neck: Secondary | ICD-10-CM | POA: Insufficient documentation

## 2018-05-05 LAB — CBC WITH DIFFERENTIAL/PLATELET
Abs Immature Granulocytes: 0.02 10*3/uL (ref 0.00–0.07)
BASOS ABS: 0 10*3/uL (ref 0.0–0.1)
BASOS PCT: 1 %
EOS ABS: 0.3 10*3/uL (ref 0.0–0.5)
Eosinophils Relative: 5 %
HEMATOCRIT: 39 % (ref 36.0–46.0)
Hemoglobin: 12.6 g/dL (ref 12.0–15.0)
IMMATURE GRANULOCYTES: 0 %
Lymphocytes Relative: 15 %
Lymphs Abs: 0.8 10*3/uL (ref 0.7–4.0)
MCH: 31.2 pg (ref 26.0–34.0)
MCHC: 32.3 g/dL (ref 30.0–36.0)
MCV: 96.5 fL (ref 80.0–100.0)
Monocytes Absolute: 0.4 10*3/uL (ref 0.1–1.0)
Monocytes Relative: 8 %
NEUTROS PCT: 71 %
NRBC: 0 % (ref 0.0–0.2)
Neutro Abs: 3.6 10*3/uL (ref 1.7–7.7)
PLATELETS: 205 10*3/uL (ref 150–400)
RBC: 4.04 MIL/uL (ref 3.87–5.11)
RDW: 13.2 % (ref 11.5–15.5)
WBC: 5.1 10*3/uL (ref 4.0–10.5)

## 2018-05-05 LAB — COMPREHENSIVE METABOLIC PANEL
ALT: 11 U/L (ref 0–44)
ANION GAP: 10 (ref 5–15)
AST: 17 U/L (ref 15–41)
Albumin: 3.8 g/dL (ref 3.5–5.0)
Alkaline Phosphatase: 87 U/L (ref 38–126)
BILIRUBIN TOTAL: 0.3 mg/dL (ref 0.3–1.2)
BUN: 9 mg/dL (ref 8–23)
CO2: 31 mmol/L (ref 22–32)
Calcium: 9.4 mg/dL (ref 8.9–10.3)
Chloride: 101 mmol/L (ref 98–111)
Creatinine, Ser: 0.89 mg/dL (ref 0.44–1.00)
GLUCOSE: 81 mg/dL (ref 70–99)
Potassium: 3.7 mmol/L (ref 3.5–5.1)
Sodium: 142 mmol/L (ref 135–145)
Total Protein: 7 g/dL (ref 6.5–8.1)

## 2018-05-05 MED ORDER — IOHEXOL 300 MG/ML  SOLN
100.0000 mL | Freq: Once | INTRAMUSCULAR | Status: AC | PRN
  Administered 2018-05-05: 100 mL via INTRAVENOUS

## 2018-05-05 MED ORDER — SODIUM CHLORIDE (PF) 0.9 % IJ SOLN
INTRAMUSCULAR | Status: AC
  Filled 2018-05-05: qty 50

## 2018-05-06 ENCOUNTER — Other Ambulatory Visit: Payer: Self-pay | Admitting: Hematology and Oncology

## 2018-05-06 ENCOUNTER — Encounter: Payer: Self-pay | Admitting: Hematology and Oncology

## 2018-05-06 ENCOUNTER — Inpatient Hospital Stay (HOSPITAL_BASED_OUTPATIENT_CLINIC_OR_DEPARTMENT_OTHER): Payer: Medicare HMO | Admitting: Hematology and Oncology

## 2018-05-06 DIAGNOSIS — C8231 Follicular lymphoma grade IIIa, lymph nodes of head, face, and neck: Secondary | ICD-10-CM | POA: Diagnosis not present

## 2018-05-06 DIAGNOSIS — Z7982 Long term (current) use of aspirin: Secondary | ICD-10-CM | POA: Diagnosis not present

## 2018-05-06 DIAGNOSIS — Z79899 Other long term (current) drug therapy: Secondary | ICD-10-CM | POA: Diagnosis not present

## 2018-05-06 NOTE — Assessment & Plan Note (Signed)
I have reviewed her blood work and imaging study She has no signs of cancer recurrence I plan to see her in 6 months with repeat history, physical examination only.  She will get routine blood test through her primary care doctor She is cancer free for more than 2 years.  There is no benefit for routine surveillance imaging study in the future unless she has signs and symptoms that suggest cancer recurrence.

## 2018-05-06 NOTE — Progress Notes (Signed)
[facility] OFFICE PROGRESS NOTE  Patient Care Team: Unk Pinto, MD as PCP - General (Internal Medicine) Lafayette Dragon, MD (Inactive) as Consulting Physician (Gastroenterology) Loletha Carrow, MD (Pulmonary Disease) Heath Lark, MD as Consulting Physician (Hematology and Oncology) Rozetta Nunnery, MD as Consulting Physician (Otolaryngology) Chales Salmon, OD as Consulting Physician (Optometry)  ASSESSMENT & PLAN:  Follicular lymphoma grade iiia, lymph nodes of head, face, and neck ([place]) I have reviewed her blood work and imaging study She has no signs of cancer recurrence I plan to see her in 6 months with repeat history, physical examination only.  She will get routine blood test through her primary care doctor She is cancer free for more than 2 years.  There is no benefit for routine surveillance imaging study in the future unless she has signs and symptoms that suggest cancer recurrence.   No orders of the defined types were placed in this encounter.   INTERVAL HISTORY: Please see below for problem oriented charting. She returns for further follow-up Denies new lymphadenopathy No recent infection She feels well  SUMMARY OF ONCOLOGIC HISTORY:   Follicular lymphoma grade iiia, lymph nodes of head, face, and neck ([place])   01/29/2016 Imaging    Ct neck showed right submandibular lymphadenopathy and asymmetric prominence of jugular chain nodes. Metastatic disease is the primary diagnostic concern and submandibular node sampling is recommended. No primary lesion is seen, limited at the oral cavity due to dental artifact. 2. Emphysema    02/04/2016 Pathology Results    Accession: [id number] Biopsy showed high grade follicular lymphoma    40/98/1191 PET scan    Hypermetabolic right submandibular and right level II/III lymph nodes, consistent with the given history of lymphoma. No hypermetabolic lymph nodes in the chest, abdomen or pelvis. Aortic  atherosclerosis (ICD10-170.0). Coronary artery calcification. Emphysema (ICD10-J43.9).    02/24/2016 Imaging    ECHO showed LV The cavity size was normal. Systolic function was   normal. The estimated ejection fraction was in the range of 55% to 60%. Wall motion was normal; there were no regional wall motion abnormalities. Doppler parameters are consistent with abnormal left ventricular relaxation (grade 1 diastolic dysfunction).    03/13/2016 - 04/24/2016 Chemotherapy    The patient had R-CHOP chemo x 3 cycles    05/27/2016 PET scan    No metabolic activity associated with the RIGHT submandibular gland or RIGHT cervical lymph nodes consistent with a complete response. 2. Bilateral pulmonary nodules are not changed from comparison exam. 3. Diffuse mild esophagus metabolic activity suggests mild esophagitis.    06/12/2016 - 04/04/2018 Chemotherapy    The patient had consolidation treatment with maintenance Rituximab    05/05/2018 Imaging    1. No findings of active lymphoma in the chest, abdomen, or pelvis. No splenomegaly or focal splenic lesion. 2. Other imaging findings of potential clinical significance: Aortic Atherosclerosis (ICD10-I70.0) and Emphysema (ICD10-J43.9). Coronary atherosclerosis. Small type 1 hiatal hernia. Chronic small basilar pulmonary nodules, long-term chronicity indicating benign etiology. Stable appearance of T12 anterior wedge compression. Cholelithiasis. Descending and sigmoid colon diverticulosis. Prominent stool throughout the colon favors constipation.     REVIEW OF SYSTEMS:   Constitutional: Denies fevers, chills or abnormal weight loss Eyes: Denies blurriness of vision Ears, nose, mouth, throat, and face: Denies mucositis or sore throat Respiratory: Denies cough, dyspnea or wheezes Cardiovascular: Denies palpitation, chest discomfort or lower extremity swelling Gastrointestinal:  Denies nausea, heartburn or change in bowel habits Skin: Denies abnormal skin  rashes Lymphatics: Denies  new lymphadenopathy or easy bruising Neurological:Denies numbness, tingling or new weaknesses Behavioral/Psych: Mood is stable, no new changes  All other systems were reviewed with the patient and are negative.  I have reviewed the past medical history, past surgical history, social history and family history with the patient and they are unchanged from previous note.  ALLERGIES:  has No Known Allergies.  MEDICATIONS:  Current Outpatient Medications  Medication Sig Dispense Refill  . alendronate (FOSAMAX) 70 MG tablet Take 1 tablet weekly with a full glass of water for 30 minutes on an empty stomach 12 tablet 3  . aspirin EC 81 MG tablet Take 81 mg by mouth daily.    . Coenzyme Q10 10 MG capsule Take 10 mg by mouth 2 (two) times daily.    . Fluticasone-Umeclidin-Vilant (TRELEGY ELLIPTA) 100-62.5-25 MCG/INH AEPB Inhale 100 mcg into the lungs daily. 60 each 6  . gabapentin (NEURONTIN) 300 MG capsule TAKE 1 CAPSULE BY MOUTH 3 TO 4 TIMES DAILY FOR PAIN 360 capsule 1  . Magnesium 250 MG TABS Take 250 mg by mouth daily.     Marland Kitchen omeprazole (PRILOSEC) 40 MG capsule TAKE ONE CAPSULE BY MOUTH ONCE DAILY FOR  ACID  REFLUX 90 capsule 1  . potassium chloride (K-DUR,KLOR-CON) 10 MEQ tablet Take 10-20 mEq by mouth See admin instructions. 39mEq in the AM and 74mEq in the PM    . rosuvastatin (CRESTOR) 40 MG tablet Take 1/2 to 1 tablet daily or as directed for Cholesterol 30 tablet 5  . Tiotropium Bromide-Olodaterol (STIOLTO RESPIMAT) 2.5-2.5 MCG/ACT AERS Inhale 2 Act into the lungs daily. 12 g 4  . Vinpocetine POWD Take 10 mcg by mouth daily. Capsule form    . vitamin C (ASCORBIC ACID) 500 MG tablet Take 500 mg by mouth daily.     No current facility-administered medications for this visit.     PHYSICAL EXAMINATION: ECOG PERFORMANCE STATUS: 0 - Asymptomatic  Vitals:   05/06/18 0845  BP: (!) 151/70  Pulse: 87  Resp: 18  Temp: 98 F (36.7 C)  SpO2: 95%   Filed Weights    05/06/18 0845  Weight: 114 lb 6.4 oz (51.9 kg)    GENERAL:alert, no distress and comfortable NEURO: alert & oriented x 3 with fluent speech, no focal motor/sensory deficits  LABORATORY DATA:  I have reviewed the data as listed    Component Value Date/Time   NA 142 05/05/2018 1033   NA 142 02/12/2017 0809   K 3.7 05/05/2018 1033   K 4.0 02/12/2017 0809   CL 101 05/05/2018 1033   CO2 31 05/05/2018 1033   CO2 28 02/12/2017 0809   GLUCOSE 81 05/05/2018 1033   GLUCOSE 97 02/12/2017 0809   BUN 9 05/05/2018 1033   BUN 11.2 02/12/2017 0809   CREATININE 0.89 05/05/2018 1033   CREATININE 0.95 (H) 04/27/2018 1457   CREATININE 0.9 02/12/2017 0809   CALCIUM 9.4 05/05/2018 1033   CALCIUM 9.3 02/12/2017 0809   PROT 7.0 05/05/2018 1033   PROT 6.7 02/12/2017 0809   ALBUMIN 3.8 05/05/2018 1033   ALBUMIN 3.8 02/12/2017 0809   AST 17 05/05/2018 1033   AST 16 02/12/2017 0809   ALT 11 05/05/2018 1033   ALT 15 02/12/2017 0809   ALKPHOS 87 05/05/2018 1033   ALKPHOS 88 02/12/2017 0809   BILITOT 0.3 05/05/2018 1033   BILITOT 0.40 02/12/2017 0809   GFRNONAA >60 05/05/2018 1033   GFRNONAA 59 (L) 04/27/2018 1457   GFRAA >60 05/05/2018 1033  GFRAA 68 04/27/2018 1457    No results found for: SPEP, UPEP  Lab Results  Component Value Date   WBC 5.1 05/05/2018   NEUTROABS 3.6 05/05/2018   HGB 12.6 05/05/2018   HCT 39.0 05/05/2018   MCV 96.5 05/05/2018   PLT 205 05/05/2018      Chemistry      Component Value Date/Time   NA 142 05/05/2018 1033   NA 142 02/12/2017 0809   K 3.7 05/05/2018 1033   K 4.0 02/12/2017 0809   CL 101 05/05/2018 1033   CO2 31 05/05/2018 1033   CO2 28 02/12/2017 0809   BUN 9 05/05/2018 1033   BUN 11.2 02/12/2017 0809   CREATININE 0.89 05/05/2018 1033   CREATININE 0.95 (H) 04/27/2018 1457   CREATININE 0.9 02/12/2017 0809      Component Value Date/Time   CALCIUM 9.4 05/05/2018 1033   CALCIUM 9.3 02/12/2017 0809   ALKPHOS 87 05/05/2018 1033   ALKPHOS 88  02/12/2017 0809   AST 17 05/05/2018 1033   AST 16 02/12/2017 0809   ALT 11 05/05/2018 1033   ALT 15 02/12/2017 0809   BILITOT 0.3 05/05/2018 1033   BILITOT 0.40 02/12/2017 0809       RADIOGRAPHIC STUDIES: I have reviewed all imaging studies with the patient I have personally reviewed the radiological images as listed and agreed with the findings in the report. Ct Chest W Contrast  Result Date: 05/05/2018 CLINICAL DATA:  Facial and neck lymphoma diagnosed in 2018 with chemotherapy completed in 2019. Restaging assessment EXAM: CT CHEST, ABDOMEN, AND PELVIS WITH CONTRAST TECHNIQUE: Multidetector CT imaging of the chest, abdomen and pelvis was performed following the standard protocol during bolus administration of intravenous contrast. CONTRAST:  144mL OMNIPAQUE IOHEXOL 300 MG/ML  SOLN COMPARISON:  Multiple exams, including chest CT from 10/20/2017 and PET-CT from 05/27/2016 FINDINGS: CT CHEST FINDINGS Cardiovascular: Coronary, aortic arch, and branch vessel atherosclerotic vascular disease. Mediastinum/Nodes: Small type 1 hiatal hernia. No appreciable adenopathy in the chest. Lungs/Pleura: Biapical pleuroparenchymal scarring. Prominent centrilobular emphysema. Stable faint sub solid nodularity in the right lower lobe for example including a 3 mm sub solid nodule on image 121. Tiny subpleural nodules along the upper margin of the major fissure appears stable. Mild nodularity in the left lower lobe is similar to the prior exam, with the dominant lesion a 0.6 by 0.5 cm left lower lobe nodule on image 115/4, stable from 2014 hence considered benign. Musculoskeletal: Similar appearance of a T12 inferior endplate compression fracture anteriorly, not changed from 10/20/2017. CT ABDOMEN PELVIS FINDINGS Hepatobiliary: A previously photopenic 1.6 by 1.3 cm fluid density lesion anteriorly in segment 3 of the liver on image 62/2 is compatible with a benign cyst. Multiple gallstones measuring up to about 7 mm in  diameter are present in the gallbladder. No biliary dilatation. The liver appears otherwise unremarkable. Pancreas: Unremarkable Spleen: The spleen measures 7.5 by 5.1 by 5.4 cm (volume = 110 cm^3). No focal splenic lesion. Adrenals/Urinary Tract: The adrenal glands appear normal. Small hypodense renal lesions more concentrated on the left than the right are technically too small to characterize although statistically likely to be benign cysts. The urinary bladder and distal left ureter partially obscured by streak artifact from the patient's left hip hardware. Stomach/Bowel: Descending and sigmoid colon diverticulosis. Prominent stool throughout the colon favors constipation. Vascular/Lymphatic: Aortoiliac atherosclerotic vascular disease. No pathologic adenopathy identified in the abdomen/pelvis. Reproductive: Partially obscured by streak artifact from the hip implant. No gross abnormality observed. Other:  No supplemental non-categorized findings. Musculoskeletal: Left total hip prosthesis. Bony demineralization. Grade 1 degenerative anterolisthesis at L3-4 with considerable loss of disc height at L3-4, L4-5, and L5-S1. IMPRESSION: 1. No findings of active lymphoma in the chest, abdomen, or pelvis. No splenomegaly or focal splenic lesion. 2. Other imaging findings of potential clinical significance: Aortic Atherosclerosis (ICD10-I70.0) and Emphysema (ICD10-J43.9). Coronary atherosclerosis. Small type 1 hiatal hernia. Chronic small basilar pulmonary nodules, long-term chronicity indicating benign etiology. Stable appearance of T12 anterior wedge compression. Cholelithiasis. Descending and sigmoid colon diverticulosis. Prominent stool throughout the colon favors constipation. Electronically Signed   By: Van Clines M.D.   On: 05/05/2018 17:01   Ct Abdomen Pelvis W Contrast  Result Date: 05/05/2018 CLINICAL DATA:  Facial and neck lymphoma diagnosed in 2018 with chemotherapy completed in 2019. Restaging  assessment EXAM: CT CHEST, ABDOMEN, AND PELVIS WITH CONTRAST TECHNIQUE: Multidetector CT imaging of the chest, abdomen and pelvis was performed following the standard protocol during bolus administration of intravenous contrast. CONTRAST:  127mL OMNIPAQUE IOHEXOL 300 MG/ML  SOLN COMPARISON:  Multiple exams, including chest CT from 10/20/2017 and PET-CT from 05/27/2016 FINDINGS: CT CHEST FINDINGS Cardiovascular: Coronary, aortic arch, and branch vessel atherosclerotic vascular disease. Mediastinum/Nodes: Small type 1 hiatal hernia. No appreciable adenopathy in the chest. Lungs/Pleura: Biapical pleuroparenchymal scarring. Prominent centrilobular emphysema. Stable faint sub solid nodularity in the right lower lobe for example including a 3 mm sub solid nodule on image 121. Tiny subpleural nodules along the upper margin of the major fissure appears stable. Mild nodularity in the left lower lobe is similar to the prior exam, with the dominant lesion a 0.6 by 0.5 cm left lower lobe nodule on image 115/4, stable from 2014 hence considered benign. Musculoskeletal: Similar appearance of a T12 inferior endplate compression fracture anteriorly, not changed from 10/20/2017. CT ABDOMEN PELVIS FINDINGS Hepatobiliary: A previously photopenic 1.6 by 1.3 cm fluid density lesion anteriorly in segment 3 of the liver on image 62/2 is compatible with a benign cyst. Multiple gallstones measuring up to about 7 mm in diameter are present in the gallbladder. No biliary dilatation. The liver appears otherwise unremarkable. Pancreas: Unremarkable Spleen: The spleen measures 7.5 by 5.1 by 5.4 cm (volume = 110 cm^3). No focal splenic lesion. Adrenals/Urinary Tract: The adrenal glands appear normal. Small hypodense renal lesions more concentrated on the left than the right are technically too small to characterize although statistically likely to be benign cysts. The urinary bladder and distal left ureter partially obscured by streak artifact  from the patient's left hip hardware. Stomach/Bowel: Descending and sigmoid colon diverticulosis. Prominent stool throughout the colon favors constipation. Vascular/Lymphatic: Aortoiliac atherosclerotic vascular disease. No pathologic adenopathy identified in the abdomen/pelvis. Reproductive: Partially obscured by streak artifact from the hip implant. No gross abnormality observed. Other: No supplemental non-categorized findings. Musculoskeletal: Left total hip prosthesis. Bony demineralization. Grade 1 degenerative anterolisthesis at L3-4 with considerable loss of disc height at L3-4, L4-5, and L5-S1. IMPRESSION: 1. No findings of active lymphoma in the chest, abdomen, or pelvis. No splenomegaly or focal splenic lesion. 2. Other imaging findings of potential clinical significance: Aortic Atherosclerosis (ICD10-I70.0) and Emphysema (ICD10-J43.9). Coronary atherosclerosis. Small type 1 hiatal hernia. Chronic small basilar pulmonary nodules, long-term chronicity indicating benign etiology. Stable appearance of T12 anterior wedge compression. Cholelithiasis. Descending and sigmoid colon diverticulosis. Prominent stool throughout the colon favors constipation. Electronically Signed   By: Van Clines M.D.   On: 05/05/2018 17:01   Xr Hip Unilat W Or W/o Pelvis 2-3 Views Left  Result Date: 04/20/2018 Stable left total hip replacement.  No complications.   All questions were answered. The patient knows to call the clinic with any problems, questions or concerns. No barriers to learning was detected.  I spent 10 minutes counseling the patient face to face. The total time spent in the appointment was 15 minutes and more than 50% was on counseling and review of test results  Heath Lark, MD 05/06/2018 10:09 AM

## 2018-05-09 ENCOUNTER — Ambulatory Visit: Payer: Medicare HMO

## 2018-05-09 ENCOUNTER — Other Ambulatory Visit: Payer: Medicare HMO

## 2018-05-09 ENCOUNTER — Ambulatory Visit: Payer: Medicare HMO | Admitting: Hematology and Oncology

## 2018-05-16 ENCOUNTER — Encounter: Payer: Self-pay | Admitting: *Deleted

## 2018-05-25 ENCOUNTER — Telehealth: Payer: Self-pay

## 2018-05-25 NOTE — Telephone Encounter (Signed)
She called and ask for a letter with dates of treatment be sent to her for her insurance policy. Ask that the letter be mailed to her and she will submit it to her insurance company.

## 2018-05-26 NOTE — Telephone Encounter (Signed)
Mailed letter to patient

## 2018-06-02 ENCOUNTER — Encounter: Payer: Self-pay | Admitting: Internal Medicine

## 2018-06-02 DIAGNOSIS — J449 Chronic obstructive pulmonary disease, unspecified: Secondary | ICD-10-CM | POA: Diagnosis not present

## 2018-06-02 DIAGNOSIS — J9690 Respiratory failure, unspecified, unspecified whether with hypoxia or hypercapnia: Secondary | ICD-10-CM | POA: Diagnosis not present

## 2018-06-02 DIAGNOSIS — I6789 Other cerebrovascular disease: Secondary | ICD-10-CM | POA: Diagnosis not present

## 2018-06-02 DIAGNOSIS — R5381 Other malaise: Secondary | ICD-10-CM | POA: Diagnosis not present

## 2018-06-02 DIAGNOSIS — G4733 Obstructive sleep apnea (adult) (pediatric): Secondary | ICD-10-CM | POA: Diagnosis not present

## 2018-06-08 ENCOUNTER — Telehealth: Payer: Self-pay

## 2018-06-08 NOTE — Telephone Encounter (Signed)
She called and left a message to call her.  Called back. She never got the letter mailed out on 2/20 with dates of treatment be sent to her for her insurance policy. Mailed letter out again. Copy made of letter. Ask her to call the office if she does not get the letter. She verbalized understanding.

## 2018-06-17 ENCOUNTER — Other Ambulatory Visit: Payer: Self-pay | Admitting: Physician Assistant

## 2018-07-01 DIAGNOSIS — I6789 Other cerebrovascular disease: Secondary | ICD-10-CM | POA: Diagnosis not present

## 2018-07-01 DIAGNOSIS — J449 Chronic obstructive pulmonary disease, unspecified: Secondary | ICD-10-CM | POA: Diagnosis not present

## 2018-07-01 DIAGNOSIS — J9690 Respiratory failure, unspecified, unspecified whether with hypoxia or hypercapnia: Secondary | ICD-10-CM | POA: Diagnosis not present

## 2018-07-01 DIAGNOSIS — R5381 Other malaise: Secondary | ICD-10-CM | POA: Diagnosis not present

## 2018-08-01 DIAGNOSIS — I6789 Other cerebrovascular disease: Secondary | ICD-10-CM | POA: Diagnosis not present

## 2018-08-01 DIAGNOSIS — R5381 Other malaise: Secondary | ICD-10-CM | POA: Diagnosis not present

## 2018-08-01 DIAGNOSIS — J9690 Respiratory failure, unspecified, unspecified whether with hypoxia or hypercapnia: Secondary | ICD-10-CM | POA: Diagnosis not present

## 2018-08-01 DIAGNOSIS — J449 Chronic obstructive pulmonary disease, unspecified: Secondary | ICD-10-CM | POA: Diagnosis not present

## 2018-08-09 ENCOUNTER — Encounter: Payer: Self-pay | Admitting: Internal Medicine

## 2018-08-09 DIAGNOSIS — R7303 Prediabetes: Secondary | ICD-10-CM | POA: Insufficient documentation

## 2018-08-09 NOTE — Progress Notes (Signed)
[facility] Unk Pinto, M.D.     Uvaldo Bristle. Silverio Lay, P.A.-C Liane Comber, [place] [address] [place] [address] Telephone [phone] Telefax [phone] Annual Screening/Preventative Visit & Comprehensive Evaluation &  Examination  History of Present Illness:     This very nice 78 y.o. WWF presents for a Screening /Preventative Visit & comprehensive evaluation and management of multiple medical co-morbidities.  Patient has been followed for HTN, HLD, Prediabetes  and Vitamin D Deficiency. Patient also has COPD & OSA/Cpap overlap. She has stopped her Trelegy, but continues her Stiolto Respimat.  Her GERD is controlled on her meds & she stopped her Omeprazole.      Patient was dx'd with a Head & Neck stage IIIa Follicular Lymphoma in Oct 2-017 and was treated by Dr Heath Lark with Chemo thru Dec 2019. CT scans in Jan 2020 were felt to find her tumor in remission.       HTN predates since 70. Patient's BP has been controlled at home and patient denies any cardiac symptoms as chest pain, palpitations, shortness of breath, dizziness or ankle swelling. Today's BP is at goal - 128/82.      Patient's hyperlipidemia is not controlled with diet and she has stopped her Rosuvaststin. Last lipids were not at goal: Lab Results  Component Value Date   CHOL 250 (H) 04/27/2018   HDL 49 (L) 04/27/2018   LDLCALC 164 (H) 04/27/2018   TRIG 209 (H) 04/27/2018   CHOLHDL 5.1 (H) 04/27/2018      Patient has hx/o prediabetes and patient denies reactive hypoglycemic symptoms, visual blurring, diabetic polys or paresthesias. Last A1c was Normal & at goal: Lab Results  Component Value Date   HGBA1C 5.2 04/27/2018      Finally, patient has history of Vitamin D Deficiency ("44" / 2008) and last Vitamin D was at goal: Lab Results  Component Value Date   VD25OH 77 08/18/2017    Current Outpatient Medications on  File Prior to Visit  Medication Sig  . aspirin EC 81 MG tablet Take 81 mg by mouth daily.  . Coenzyme Q10 10 MG capsule Take 10 mg by mouth 2 (two) times daily.  Marland Kitchen gabapentin (NEURONTIN) 300 MG capsule TAKE 1 CAPSULE BY MOUTH 3 TO 4 TIMES DAILY FOR PAIN  . Magnesium 250 MG TABS Take 250 mg by mouth daily.   . potassium chloride (K-DUR,KLOR-CON) 10 MEQ tablet Take 10-20 mEq by mouth See admin instructions. 50mEq in the AM and 3mEq in the PM  . STIOLTO RESPIMAT 2.5-2.5 MCG/ACT AERS INHALE 2 PUFFS EVERY DAY  . vitamin C (ASCORBIC ACID) 500 MG tablet Take 500 mg by mouth daily.  . [DISCONTINUED] prochlorperazine (COMPAZINE) 10 MG tablet Take 1 tablet (10 mg total) by mouth every 6 (six) hours as needed (Nausea or vomiting).   No current facility-administered medications on file prior to visit.    No Active Allergies   Past Medical History:  Diagnosis Date  . Anxiety   . COPD (chronic obstructive pulmonary disease) ([place])   . Diverticulosis   . Diverticulosis of colon with hemorrhage   . GERD (gastroesophageal reflux disease)   . H/O total knee replacement, left 11/05/2017  . Hx of adenomatous colonic polyps   . Hyperlipidemia   . Hypertension   . Hypomagnesemia   . Neuropathy   . Peripheral neuropathy   . Prediabetes   . RLS (restless legs syndrome)   . Sleep apnea  CPAP  . Vitamin B12 deficiency   . Vitamin D deficiency    Health Maintenance  Topic Date Due  . TETANUS/TDAP  08/14/2018  . INFLUENZA VACCINE  11/05/2018  . DEXA SCAN  Completed  . PNA vac Low Risk Adult  Completed   Immunization History  Administered Date(s) Administered  . Influenza Split 01/18/2013  . Influenza, High Dose Seasonal PF 12/05/2014, 01/23/2016, 12/22/2016  . Pneumococcal Conjugate-13 11/27/2013  . Pneumococcal Polysaccharide-23 11/19/2011  . Td 08/13/2008  . Zoster 04/06/2010   Last Colon - 05/18/2014 - Dr Delfin Edis - recc 5 yr f/u - due Feb 2021  Last MGM - 04/07/2017   Past Surgical  History:  Procedure Laterality Date  . APPENDECTOMY    . CATARACT EXTRACTION, BILATERAL  2017   Bilateral cataract Dr. Bing Plume  . COLONOSCOPY N/A 05/18/2014   Procedure: COLONOSCOPY;  Surgeon: Lafayette Dragon, MD;  Location: [facility];  Service: Endoscopy;  Laterality: N/A;  . DIRECT LARYNGOSCOPY Right 02/04/2016   Procedure: DIRECT LARYNGOSCOPY;  Surgeon: Rozetta Nunnery, MD;  Location: [facility];  Service: ENT;  Laterality: Right;  . LYMPH NODE BIOPSY Right 02/04/2016   Procedure: EXCISIONAL BIOPSY RIGHT NECK LYMPH NODE;  Surgeon: Rozetta Nunnery, MD;  Location: [facility];  Service: ENT;  Laterality: Right;  . TONSILLECTOMY    . TOTAL HIP ARTHROPLASTY Left 10/21/2017   Procedure: TOTAL HIP ARTHROPLASTY ANTERIOR APPROACH;  Surgeon: Leandrew Koyanagi, MD;  Location: [facility];  Service: Orthopedics;  Laterality: Left;   Family History  Problem Relation Age of Onset  . Stroke Mother   . Stroke Maternal Grandmother   . Colon cancer Neg Hx    Social History   Tobacco Use  . Smoking status: Former Smoker    Packs/day: 1.00    Years: 30.00    Pack years: 30.00    Types: Cigarettes    Last attempt to quit: 04/06/2012    Years since quitting: 6.3  . Smokeless tobacco: Never Used  Substance Use Topics  . Alcohol use: No  . Drug use: No    ROS Constitutional: Denies fever, chills, weight loss/gain, headaches, insomnia,  night sweats, and change in appetite. Does c/o fatigue. Eyes: Denies redness, blurred vision, diplopia, discharge, itchy, watery eyes.  ENT: Denies discharge, congestion, post nasal drip, epistaxis, sore throat, earache, hearing loss, dental pain, Tinnitus, Vertigo, Sinus pain, snoring.  Cardio: Denies chest pain, palpitations, irregular heartbeat, syncope, dyspnea, diaphoresis, orthopnea, PND, claudication, edema Respiratory: denies cough, dyspnea, DOE, pleurisy, hoarseness, laryngitis, wheezing.  Gastrointestinal: Denies dysphagia,  heartburn, reflux, water brash, pain, cramps, nausea, vomiting, bloating, diarrhea, constipation, hematemesis, melena, hematochezia, jaundice, hemorrhoids Genitourinary: Denies dysuria, frequency, urgency, nocturia, hesitancy, discharge, hematuria, flank pain Breast: Breast lumps, nipple discharge, bleeding.  Musculoskeletal: Denies arthralgia, myalgia, stiffness, Jt. Swelling, pain, limp, and strain/sprain. Denies falls. Skin: Denies puritis, rash, hives, warts, acne, eczema, changing in skin lesion Neuro: No weakness, tremor, incoordination, spasms, paresthesia, pain Psychiatric: Denies confusion, memory loss, sensory loss. Denies Depression. Endocrine: Denies change in weight, skin, hair change, nocturia, and paresthesia, diabetic polys, visual blurring, hyper / hypo glycemic episodes.  Heme/Lymph: No excessive bleeding, bruising, enlarged lymph nodes.  Physical Exam  BP 128/82   Pulse 88   Temp (!) 97.2 F (36.2 C)   Resp 16   Ht 5\' 2"  (1.575 m)   Wt 116 lb (52.6 kg)   BMI 21.22 kg/m   General Appearance: Well nourished, well groomed and in  no apparent distress.  Eyes: PERRLA, EOMs, conjunctiva no swelling or erythema, normal fundi and vessels. Sinuses: No frontal/maxillary tenderness ENT/Mouth: EACs patent / TMs  nl. Nares clear without erythema, swelling, mucoid exudates. Oral hygiene is good. No erythema, swelling, or exudate. Tongue normal, non-obstructing. Tonsils not swollen or erythematous. Hearing normal.  Neck: Supple, thyroid not palpable. No bruits, nodes or JVD. Respiratory: Respiratory effort normal.  BS equal and clear bilateral without rales, rhonci, wheezing or stridor. Cardio: Heart sounds are normal with regular rate and rhythm and no murmurs, rubs or gallops. Peripheral pulses are normal and equal bilaterally without edema. No aortic or femoral bruits. Chest: symmetric with normal excursions and percussion. Breasts: Symmetric, without lumps, nipple discharge,  retractions, or fibrocystic changes.  Abdomen: Flat, soft with bowel sounds active. Nontender, no guarding, rebound, hernias, masses, or organomegaly.  Lymphatics: Non tender without lymphadenopathy.   Musculoskeletal: Full ROM all peripheral extremities, joint stability, 5/5 strength, and normal gait. Skin: Warm and dry without rashes, lesions, cyanosis, clubbing or  ecchymosis.  Neuro: Cranial nerves intact, reflexes equal bilaterally. Normal muscle tone, no cerebellar symptoms. Sensation intact.  Pysch: Alert and oriented X 3, normal affect, Insight and Judgment appropriate.   Assessment and Plan  1. Annual Preventative Screening Examination  2. Essential hypertension  - EKG 12-Lead - Urinalysis, Routine w reflex microscopic - Microalbumin / creatinine urine ratio - CBC with Differential/Platelet - COMPLETE METABOLIC PANEL WITH GFR - Magnesium - TSH  3. Hyperlipidemia, mixed  - EKG 12-Lead - Lipid panel - TSH  4. Abnormal glucose  - EKG 12-Lead - Hemoglobin A1c - Insulin, random  5. Vitamin D deficiency  - VITAMIN D 25 Hydroxyl  6. Prediabetes  - EKG 12-Lead - Hemoglobin A1c - Insulin, random  7. CKD (chronic kidney disease) stage 3, GFR 30-59 ml/min (HCC)  - Urinalysis, Routine w reflex microscopic - Microalbumin / creatinine urine ratio - COMPLETE METABOLIC PANEL WITH GFR  8. Gastroesophageal reflux disease  - CBC with Differential/Platelet  9. Pulmonary emphysema ([place])   10. Aortic atherosclerosis (HCC)  - EKG 12-Lead - Lipid panel  11. Osteoporosis   12. Vitamin B12 deficiency  - Vitamin B12 - CBC with Differential/Platelet  13. Iron deficiency  - Iron,Total/Total Iron Binding Cap  14. Hx of adenomatous colonic polyps  15. Screening for ischemic heart disease  - EKG 12-Lead  16. RLS (restless legs syndrome)  - Iron,Total/Total Iron Binding Cap  17. Family history of cerebrovascular event  - EKG 12-Lead  18. Former  smoker  - EKG 12-Lead  19. Medication management  - Urinalysis, Routine w reflex microscopic - Microalbumin / creatinine urine ratio - Vitamin B12 - CBC with Differential/Platelet - COMPLETE METABOLIC PANEL WITH GFR - Magnesium - Lipid panel - TSH - Hemoglobin A1c - Insulin, random - VITAMIN D 25 Hydroxy   20. Screening for colorectal cancer  - POC Hemoccult Bld/Stl         Patient was counseled in prudent diet to achieve/maintain BMI less than 25 for weight control, BP monitoring, regular exercise and medications. Discussed med's effects and SE's. Screening labs and tests as requested with regular follow-up as recommended. I discussed the assessment and treatment plan as above with the patient. The patient was provided an opportunity to ask questions and all were answered. The patient agreed with the plan and demonstrated an understanding of the instructions. I provided over 40 minutes of exam, counseling, chart review and  complex critical decision making.   Gwyndolyn Saxon  Melton Krebs, MD

## 2018-08-09 NOTE — Patient Instructions (Signed)
>>>>>>>>>>>>>>>>>>>>>>>>>>>>>>>>>>>>>>>>>>>>>>>>>>>>>>> Coronavirus (COVID-19) Are you at risk?  Are you at risk for the Coronavirus (COVID-19)?  To be considered HIGH RISK for Coronavirus (COVID-19), you have to meet the following criteria:  . Traveled to [place], [place], [place], [place] or [place]; or in the [place] to [place], [place], [place]  . or [place]; and have fever, cough, and shortness of breath within the last 2 weeks of travel OR . Been in close contact with a person diagnosed with COVID-19 within the last 2 weeks and have  . fever, cough,and shortness of breath .  . IF YOU DO NOT MEET THESE CRITERIA, YOU ARE CONSIDERED LOW RISK FOR COVID-19.  What to do if you are HIGH RISK for COVID-19?  Marland Kitchen If you are having a medical emergency, call 911. . Seek medical care right away. Before you go to a doctor's office, urgent care or emergency department, .  call ahead and tell them about your recent travel, contact with someone diagnosed with COVID-19  .  and your symptoms.  . You should receive instructions from your physician's office regarding next steps of care.  . When you arrive at healthcare provider, tell the healthcare staff immediately you have returned from  . visiting [place], [place], [place], [place] or [place]; or traveled in the [place] to [place], [place],  . [place] or [place] in the last two weeks or you have been in close contact with a person diagnosed with  . COVID-19 in the last 2 weeks.   . Tell the health care staff about your symptoms: fever, cough and shortness of breath. . After you have been seen by a medical provider, you will be either: o Tested for (COVID-19) and discharged home on quarantine except to seek medical care if  o symptoms worsen, and asked to  - Stay home and avoid contact with others until you get your results (4-5 days)  - Avoid travel on public transportation if possible (such as bus, train, or airplane)  or o Sent to the Emergency Department by EMS for evaluation, COVID-19 testing  and  o possible admission depending on your condition and test results.  What to do if you are LOW RISK for COVID-19?  Reduce your risk of any infection by using the same precautions used for avoiding the common cold or flu:  Marland Kitchen Wash your hands often with soap and warm water for at least 20 seconds.  If soap and water are not readily available,  . use an alcohol-based hand sanitizer with at least 60% alcohol.  . If coughing or sneezing, cover your mouth and nose by coughing or sneezing into the elbow areas of your shirt or coat, .  into a tissue or into your sleeve (not your hands). . Avoid shaking hands with others and consider head nods or verbal greetings only. . Avoid touching your eyes, nose, or mouth with unwashed hands.  . Avoid close contact with people who are sick. . Avoid places or events with large numbers of people in one location, like concerts or sporting events. . Carefully consider travel plans you have or are making. . If you are planning any travel outside or inside the [place], visit the CDC's Travelers' Health webpage for the latest health notices. . If you have some symptoms but not all symptoms, continue to monitor at home and seek medical attention  . if your symptoms worsen. . If you are having a medical emergency, call 911.   . >>>>>>>>>>>>>>>>>>>>>>>>>>>>>>>>> .  We Do NOT Approve of  [facility], [employer]  Our Patients  To Do Home Visits & We Do NOT Approve of [employer] > > > > > > > > > > > > > > > > > > > > > > > > > > > > > > > > > > > > > > >  Preventive Care for Adults  A healthy lifestyle and preventive care can promote health and wellness. Preventive health guidelines for women include the following key practices.  A routine yearly physical is a good way to check with your health care provider about your health and preventive screening. It is a  chance to share any concerns and updates on your health and to receive a thorough exam.  Visit your dentist for a routine exam and preventive care every 6 months. Brush your teeth twice a day and floss once a day. Good oral hygiene prevents tooth decay and gum disease.  The frequency of eye exams is based on your age, health, family medical history, use of contact lenses, and other factors. Follow your health care provider's recommendations for frequency of eye exams.  Eat a healthy diet. Foods like vegetables, fruits, whole grains, low-fat dairy products, and lean protein foods contain the nutrients you need without too many calories. Decrease your intake of foods high in solid fats, added sugars, and salt. Eat the right amount of calories for you. Get information about a proper diet from your health care provider, if necessary.  Regular physical exercise is one of the most important things you can do for your health. Most adults should get at least 150 minutes of moderate-intensity exercise (any activity that increases your heart rate and causes you to sweat) each week. In addition, most adults need muscle-strengthening exercises on 2 or more days a week.  Maintain a healthy weight. The body mass index (BMI) is a screening tool to identify possible weight problems. It provides an estimate of body fat based on height and weight. Your health care provider can find your BMI and can help you achieve or maintain a healthy weight. For adults 20 years and older:  A BMI below 18.5 is considered underweight.  A BMI of 18.5 to 24.9 is normal.  A BMI of 25 to 29.9 is considered overweight.  A BMI of 30 and above is considered obese.  Maintain normal blood lipids and cholesterol levels by exercising and minimizing your intake of saturated fat. Eat a balanced diet with plenty of fruit and vegetables. If your lipid or cholesterol levels are high, you are over 50, or you are at high risk for heart disease,  you may need your cholesterol levels checked more frequently. Ongoing high lipid and cholesterol levels should be treated with medicines if diet and exercise are not working.  If you smoke, find out from your health care provider how to quit. If you do not use tobacco, do not start.  Lung cancer screening is recommended for adults aged 35-80 years who are at high risk for developing lung cancer because of a history of smoking. A yearly low-dose CT scan of the lungs is recommended for people who have at least a 30-pack-year history of smoking and are a current smoker or have quit within the past 15 years. A pack year of smoking is smoking an average of 1 pack of cigarettes a day for 1 year (for example: 1 pack a day for 30 years or 2 packs a  day for 15 years). Yearly screening should continue until the smoker has stopped smoking for at least 15 years. Yearly screening should be stopped for people who develop a health problem that would prevent them from having lung cancer treatment.  Avoid use of street drugs. Do not share needles with anyone. Ask for help if you need support or instructions about stopping the use of drugs.  High blood pressure causes heart disease and increases the risk of stroke.  Ongoing high blood pressure should be treated with medicines if weight loss and exercise do not work.  If you are 16-48 years old, ask your health care provider if you should take aspirin to prevent strokes.  Diabetes screening involves taking a blood sample to check your fasting blood sugar level. This should be done once every 3 years, after age 8, if you are within normal weight and without risk factors for diabetes. Testing should be considered at a younger age or be carried out more frequently if you are overweight and have at least 1 risk factor for diabetes.  Breast cancer screening is essential preventive care for women. You should practice "breast self-awareness." This means understanding the  normal appearance and feel of your breasts and may include breast self-examination. Any changes detected, no matter how small, should be reported to a health care provider. Women in their 28s and 30s should have a clinical breast exam (CBE) by a health care provider as part of a regular health exam every 1 to 3 years. After age 45, women should have a CBE every year. Starting at age 38, women should consider having a mammogram (breast X-ray test) every year. Women who have a family history of breast cancer should talk to their health care provider about genetic screening. Women at a high risk of breast cancer should talk to their health care providers about having an MRI and a mammogram every year.  Breast cancer gene (BRCA)-related cancer risk assessment is recommended for women who have family members with BRCA-related cancers. BRCA-related cancers include breast, ovarian, tubal, and peritoneal cancers. Having family members with these cancers may be associated with an increased risk for harmful changes (mutations) in the breast cancer genes BRCA1 and BRCA2. Results of the assessment will determine the need for genetic counseling and BRCA1 and BRCA2 testing.  Routine pelvic exams to screen for cancer are no longer recommended for nonpregnant women who are considered low risk for cancer of the pelvic organs (ovaries, uterus, and vagina) and who do not have symptoms. Ask your health care provider if a screening pelvic exam is right for you.  If you have had past treatment for cervical cancer or a condition that could lead to cancer, you need Pap tests and screening for cancer for at least 20 years after your treatment. If Pap tests have been discontinued, your risk factors (such as having a new sexual partner) need to be reassessed to determine if screening should be resumed. Some women have medical problems that increase the chance of getting cervical cancer. In these cases, your health care provider may  recommend more frequent screening and Pap tests.    Colorectal cancer can be detected and often prevented. Most routine colorectal cancer screening begins at the age of 69 years and continues through age 55 years. However, your health care provider may recommend screening at an earlier age if you have risk factors for colon cancer. On a yearly basis, your health care provider may provide home test kits to  check for hidden blood in the stool. Use of a small camera at the end of a tube, to directly examine the colon (sigmoidoscopy or colonoscopy), can detect the earliest forms of colorectal cancer. Talk to your health care provider about this at age 40, when routine screening begins.  Direct exam of the colon should be repeated every 5-10 years through age 47 years, unless early forms of pre-cancerous polyps or small growths are found.  Osteoporosis is a disease in which the bones lose minerals and strength with aging. This can result in serious bone fractures or breaks. The risk of osteoporosis can be identified using a bone density scan. Women ages 59 years and over and women at risk for fractures or osteoporosis should discuss screening with their health care providers. Ask your health care provider whether you should take a calcium supplement or vitamin D to reduce the rate of osteoporosis.  Menopause can be associated with physical symptoms and risks. Hormone replacement therapy is available to decrease symptoms and risks. You should talk to your health care provider about whether hormone replacement therapy is right for you.  Use sunscreen. Apply sunscreen liberally and repeatedly throughout the day. You should seek shade when your shadow is shorter than you. Protect yourself by wearing long sleeves, pants, a wide-brimmed hat, and sunglasses year round, whenever you are outdoors.  Once a month, do a whole body skin exam, using a mirror to look at the skin on your back. Tell your health care provider  of new moles, moles that have irregular borders, moles that are larger than a pencil eraser, or moles that have changed in shape or color.  Stay current with required vaccines (immunizations).  Influenza vaccine. All adults should be immunized every year.  Tetanus, diphtheria, and acellular pertussis (Td, Tdap) vaccine. Pregnant women should receive 1 dose of Tdap vaccine during each pregnancy. The dose should be obtained regardless of the length of time since the last dose. Immunization is preferred during the 27th-36th week of gestation. An adult who has not previously received Tdap or who does not know her vaccine status should receive 1 dose of Tdap. This initial dose should be followed by tetanus and diphtheria toxoids (Td) booster doses every 10 years. Adults with an unknown or incomplete history of completing a 3-dose immunization series with Td-containing vaccines should begin or complete a primary immunization series including a Tdap dose. Adults should receive a Td booster every 10 years.    Zoster vaccine. One dose is recommended for adults aged 82 years or older unless certain conditions are present.    Pneumococcal 13-valent conjugate (PCV13) vaccine. When indicated, a person who is uncertain of her immunization history and has no record of immunization should receive the PCV13 vaccine. An adult aged 69 years or older who has certain medical conditions and has not been previously immunized should receive 1 dose of PCV13 vaccine. This PCV13 should be followed with a dose of pneumococcal polysaccharide (PPSV23) vaccine. The PPSV23 vaccine dose should be obtained at least 1 or more year(s) after the dose of PCV13 vaccine. An adult aged 44 years or older who has certain medical conditions and previously received 1 or more doses of PPSV23 vaccine should receive 1 dose of PCV13. The PCV13 vaccine dose should be obtained 1 or more years after the last PPSV23 vaccine dose.    Pneumococcal  polysaccharide (PPSV23) vaccine. When PCV13 is also indicated, PCV13 should be obtained first. All adults aged 65 years and older  should be immunized. An adult younger than age 48 years who has certain medical conditions should be immunized. Any person who resides in a nursing home or long-term care facility should be immunized. An adult smoker should be immunized. People with an immunocompromised condition and certain other conditions should receive both PCV13 and PPSV23 vaccines. People with human immunodeficiency virus (HIV) infection should be immunized as soon as possible after diagnosis. Immunization during chemotherapy or radiation therapy should be avoided. Routine use of PPSV23 vaccine is not recommended for American Indians, [place] Natives, or people younger than 65 years unless there are medical conditions that require PPSV23 vaccine. When indicated, people who have unknown immunization and have no record of immunization should receive PPSV23 vaccine. One-time revaccination 5 years after the first dose of PPSV23 is recommended for people aged 19-64 years who have chronic kidney failure, nephrotic syndrome, asplenia, or immunocompromised conditions. People who received 1-2 doses of PPSV23 before age 55 years should receive another dose of PPSV23 vaccine at age 76 years or later if at least 5 years have passed since the previous dose. Doses of PPSV23 are not needed for people immunized with PPSV23 at or after age 75 years.   Preventive Services / Frequency  Ages 14 years and over  Blood pressure check.  Lipid and cholesterol check.  Lung cancer screening. / Every year if you are aged 52-80 years and have a 30-pack-year history of smoking and currently smoke or have quit within the past 15 years. Yearly screening is stopped once you have quit smoking for at least 15 years or develop a health problem that would prevent you from having lung cancer treatment.  Clinical breast exam.** / Every year  after age 31 years.   BRCA-related cancer risk assessment.** / For women who have family members with a BRCA-related cancer (breast, ovarian, tubal, or peritoneal cancers).  Mammogram.** / Every year beginning at age 43 years and continuing for as long as you are in good health. Consult with your health care provider.  Pap test.** / Every 3 years starting at age 38 years through age 43 or 75 years with 3 consecutive normal Pap tests. Testing can be stopped between 65 and 70 years with 3 consecutive normal Pap tests and no abnormal Pap or HPV tests in the past 10 years.  Fecal occult blood test (FOBT) of stool. / Every year beginning at age 79 years and continuing until age 59 years. You may not need to do this test if you get a colonoscopy every 10 years.  Flexible sigmoidoscopy or colonoscopy.** / Every 5 years for a flexible sigmoidoscopy or every 10 years for a colonoscopy beginning at age 67 years and continuing until age 28 years.  Hepatitis C blood test.** / For all people born from [birth year] through 1963 and any individual with known risks for hepatitis C.  Osteoporosis screening.** / A one-time screening for women ages 18 years and over and women at risk for fractures or osteoporosis.  Skin self-exam. / Monthly.  Influenza vaccine. / Every year.  Tetanus, diphtheria, and acellular pertussis (Tdap/Td) vaccine.** / 1 dose of Td every 10 years.  Zoster vaccine.** / 1 dose for adults aged 35 years or older.  Pneumococcal 13-valent conjugate (PCV13) vaccine.** / Consult your health care provider.  Pneumococcal polysaccharide (PPSV23) vaccine.** / 1 dose for all adults aged 12 years and older. Screening for abdominal aortic aneurysm (AAA)  by ultrasound is recommended for people who have history of high blood pressure  or who are current or former smokers. ++++++++++++++++++++ Recommend Adult Low Dose Aspirin or  coated  Aspirin 81 mg daily  To reduce risk of Colon Cancer 20 %,  Skin  Cancer 26 % ,  Melanoma 46%  and  Pancreatic cancer 60% ++++++++++++++++++++ Vitamin D goal  is between 70-100.  Please make sure that you are taking your Vitamin D as directed.  It is very important as a natural anti-inflammatory  helping hair, skin, and nails, as well as reducing stroke and heart attack risk.  It helps your bones and helps with mood. It also decreases numerous cancer risks so please take it as directed.  Low Vit D is associated with a 200-300% higher risk for CANCER  and 200-300% higher risk for HEART   ATTACK  &  STROKE.   .....................................Marland Kitchen It is also associated with higher death rate at younger ages,  autoimmune diseases like Rheumatoid arthritis, Lupus, Multiple Sclerosis.    Also many other serious conditions, like depression, Alzheimer's Dementia, infertility, muscle aches, fatigue, fibromyalgia - just to name a few. ++++++++++++++++++ Recommend the book "The END of DIETING" by Dr Excell Seltzer  & the book "The END of DIABETES " by Dr Excell Seltzer At [email or web address] - get book & Audio CD's    Being diabetic has a  300% increased risk for heart attack, stroke, cancer, and alzheimer- type vascular dementia. It is very important that you work harder with diet by avoiding all foods that are white. Avoid white rice (brown & wild rice is OK), white potatoes (sweetpotatoes in moderation is OK), White bread or wheat bread or anything made out of white flour like bagels, donuts, rolls, buns, biscuits, cakes, pastries, cookies, pizza crust, and pasta (made from white flour & egg whites) - vegetarian pasta or spinach or wheat pasta is OK. Multigrain breads like Arnold's or Pepper[employer], or multigrain sandwich thins or flatbreads.  Diet, exercise and weight loss can reverse and cure diabetes in the early stages.  Diet, exercise and weight loss is very important in the control and prevention of complications of diabetes which affects every system in your body, ie.  Brain - dementia/stroke, eyes - glaucoma/blindness, heart - heart attack/heart failure, kidneys - dialysis, stomach - gastric paralysis, intestines - malabsorption, nerves - severe painful neuritis, circulation - gangrene & loss of a leg(s), and finally cancer and Alzheimers.    I recommend avoid fried & greasy foods,  sweets/candy, white rice (brown or wild rice or Quinoa is OK), white potatoes (sweet potatoes are OK) - anything made from white flour - bagels, doughnuts, rolls, buns, biscuits,white and wheat breads, pizza crust and traditional pasta made of white flour & egg white(vegetarian pasta or spinach or wheat pasta is OK).  Multi-grain bread is OK - like multi-grain flat bread or sandwich thins. Avoid alcohol in excess. Exercise is also important.    Eat all the vegetables you want - avoid meat, especially red meat and dairy - especially cheese.  Cheese is the most concentrated form of trans-fats which is the worst thing to clog up our arteries. Veggie cheese is OK which can be found in the fresh produce section at [employer] or [employer] or [employer]  +++++++++++++++++++ DASH Eating Plan  DASH stands for "Dietary Approaches to Stop Hypertension."   The DASH eating plan is a healthy eating plan that has been shown to reduce high blood pressure (hypertension). Additional health benefits may include reducing the risk of type 2 diabetes mellitus, heart  disease, and stroke. The DASH eating plan may also help with weight loss. WHAT DO I NEED TO KNOW ABOUT THE DASH EATING PLAN? For the DASH eating plan, you will follow these general guidelines:  Choose foods with a percent daily value for sodium of less than 5% (as listed on the food label).  Use salt-free seasonings or herbs instead of table salt or sea salt.  Check with your health care provider or pharmacist before using salt substitutes.  Eat lower-sodium products, often labeled as "lower sodium" or "no salt added."  Eat fresh  foods.  Eat more vegetables, fruits, and low-fat dairy products.  Choose whole grains. Look for the word "whole" as the first word in the ingredient list.  Choose fish   Limit sweets, desserts, sugars, and sugary drinks.  Choose heart-healthy fats.  Eat veggie cheese   Eat more home-cooked food and less restaurant, buffet, and fast food.  Limit fried foods.  Cook foods using methods other than frying.  Limit canned vegetables. If you do use them, rinse them well to decrease the sodium.  When eating at a restaurant, ask that your food be prepared with less salt, or no salt if possible.                      WHAT FOODS CAN I EAT? Read Dr Fara Olden Fuhrman's books on The End of Dieting & The End of Diabetes  Grains Whole grain or whole wheat bread. Brown rice. Whole grain or whole wheat pasta. Quinoa, bulgur, and whole grain cereals. Low-sodium cereals. Corn or whole wheat flour tortillas. Whole grain cornbread. Whole grain crackers. Low-sodium crackers.  Vegetables Fresh or frozen vegetables (raw, steamed, roasted, or grilled). Low-sodium or reduced-sodium tomato and vegetable juices. Low-sodium or reduced-sodium tomato sauce and paste. Low-sodium or reduced-sodium canned vegetables.   Fruits All fresh, canned (in natural juice), or frozen fruits.  Protein Products  All fish and seafood.  Dried beans, peas, or lentils. Unsalted nuts and seeds. Unsalted canned beans.  Dairy Low-fat dairy products, such as skim or 1% milk, 2% or reduced-fat cheeses, low-fat ricotta or cottage cheese, or plain low-fat yogurt. Low-sodium or reduced-sodium cheeses.  Fats and Oils Tub margarines without trans fats. Light or reduced-fat mayonnaise and salad dressings (reduced sodium). Avocado. Safflower, olive, or canola oils. Natural peanut or almond butter.  Other Unsalted popcorn and pretzels. The items listed above may not be a complete list of recommended foods or beverages. Contact your  dietitian for more options.  +++++++++++++++  WHAT FOODS ARE NOT RECOMMENDED? Grains/ White flour or wheat flour White bread. White pasta. White rice. Refined cornbread. Bagels and croissants. Crackers that contain trans fat.  Vegetables  Creamed or fried vegetables. Vegetables in a . Regular canned vegetables. Regular canned tomato sauce and paste. Regular tomato and vegetable juices.  Fruits Dried fruits. Canned fruit in light or heavy syrup. Fruit juice.  Meat and Other Protein Products Meat in general - RED meat & White meat.  Fatty cuts of meat. Ribs, chicken wings, all processed meats as bacon, sausage, bologna, salami, fatback, hot dogs, bratwurst and packaged luncheon meats.  Dairy Whole or 2% milk, cream, half-and-half, and cream cheese. Whole-fat or sweetened yogurt. Full-fat cheeses or blue cheese. Non-dairy creamers and whipped toppings. Processed cheese, cheese spreads, or cheese curds.  Condiments Onion and garlic salt, seasoned salt, table salt, and sea salt. Canned and packaged gravies. Worcestershire sauce. Tartar sauce. Barbecue sauce. Teriyaki sauce. Soy sauce, including  reduced sodium. Steak sauce. Fish sauce. Oyster sauce. Cocktail sauce. Horseradish. Ketchup and mustard. Meat flavorings and tenderizers. Bouillon cubes. Hot sauce. Tabasco sauce. Marinades. Taco seasonings. Relishes.  Fats and Oils Butter, stick margarine, lard, shortening and bacon fat. Coconut, palm kernel, or palm oils. Regular salad dressings.  Pickles and olives. Salted popcorn and pretzels.  The items listed above may not be a complete list of foods and beverages to avoid.

## 2018-08-10 ENCOUNTER — Encounter: Payer: Self-pay | Admitting: Internal Medicine

## 2018-08-10 ENCOUNTER — Ambulatory Visit (INDEPENDENT_AMBULATORY_CARE_PROVIDER_SITE_OTHER): Payer: Medicare HMO | Admitting: Internal Medicine

## 2018-08-10 ENCOUNTER — Other Ambulatory Visit: Payer: Self-pay

## 2018-08-10 VITALS — BP 128/82 | HR 88 | Temp 97.2°F | Resp 16 | Ht 62.0 in | Wt 116.0 lb

## 2018-08-10 DIAGNOSIS — N183 Chronic kidney disease, stage 3 unspecified: Secondary | ICD-10-CM

## 2018-08-10 DIAGNOSIS — I700 Atherosclerosis of aorta: Secondary | ICD-10-CM

## 2018-08-10 DIAGNOSIS — Z79899 Other long term (current) drug therapy: Secondary | ICD-10-CM

## 2018-08-10 DIAGNOSIS — K219 Gastro-esophageal reflux disease without esophagitis: Secondary | ICD-10-CM

## 2018-08-10 DIAGNOSIS — E559 Vitamin D deficiency, unspecified: Secondary | ICD-10-CM

## 2018-08-10 DIAGNOSIS — R7303 Prediabetes: Secondary | ICD-10-CM

## 2018-08-10 DIAGNOSIS — Z0000 Encounter for general adult medical examination without abnormal findings: Secondary | ICD-10-CM | POA: Diagnosis not present

## 2018-08-10 DIAGNOSIS — Z1212 Encounter for screening for malignant neoplasm of rectum: Secondary | ICD-10-CM

## 2018-08-10 DIAGNOSIS — R7309 Other abnormal glucose: Secondary | ICD-10-CM | POA: Diagnosis not present

## 2018-08-10 DIAGNOSIS — Z0001 Encounter for general adult medical examination with abnormal findings: Secondary | ICD-10-CM

## 2018-08-10 DIAGNOSIS — E611 Iron deficiency: Secondary | ICD-10-CM | POA: Diagnosis not present

## 2018-08-10 DIAGNOSIS — Z8249 Family history of ischemic heart disease and other diseases of the circulatory system: Secondary | ICD-10-CM

## 2018-08-10 DIAGNOSIS — E538 Deficiency of other specified B group vitamins: Secondary | ICD-10-CM | POA: Diagnosis not present

## 2018-08-10 DIAGNOSIS — E782 Mixed hyperlipidemia: Secondary | ICD-10-CM

## 2018-08-10 DIAGNOSIS — M810 Age-related osteoporosis without current pathological fracture: Secondary | ICD-10-CM

## 2018-08-10 DIAGNOSIS — I10 Essential (primary) hypertension: Secondary | ICD-10-CM

## 2018-08-10 DIAGNOSIS — Z136 Encounter for screening for cardiovascular disorders: Secondary | ICD-10-CM

## 2018-08-10 DIAGNOSIS — J439 Emphysema, unspecified: Secondary | ICD-10-CM

## 2018-08-10 DIAGNOSIS — Z86010 Personal history of colonic polyps: Secondary | ICD-10-CM

## 2018-08-10 DIAGNOSIS — Z1211 Encounter for screening for malignant neoplasm of colon: Secondary | ICD-10-CM

## 2018-08-10 DIAGNOSIS — Z87891 Personal history of nicotine dependence: Secondary | ICD-10-CM

## 2018-08-10 DIAGNOSIS — G2581 Restless legs syndrome: Secondary | ICD-10-CM

## 2018-08-11 LAB — CBC WITH DIFFERENTIAL/PLATELET
Absolute Monocytes: 515 cells/uL (ref 200–950)
Basophils Absolute: 19 cells/uL (ref 0–200)
Basophils Relative: 0.3 %
Eosinophils Absolute: 81 cells/uL (ref 15–500)
Eosinophils Relative: 1.3 %
HCT: 38 % (ref 35.0–45.0)
Hemoglobin: 12.9 g/dL (ref 11.7–15.5)
Lymphs Abs: 1277 cells/uL (ref 850–3900)
MCH: 31.7 pg (ref 27.0–33.0)
MCHC: 33.9 g/dL (ref 32.0–36.0)
MCV: 93.4 fL (ref 80.0–100.0)
MPV: 11.4 fL (ref 7.5–12.5)
Monocytes Relative: 8.3 %
Neutro Abs: 4309 cells/uL (ref 1500–7800)
Neutrophils Relative %: 69.5 %
Platelets: 217 10*3/uL (ref 140–400)
RBC: 4.07 10*6/uL (ref 3.80–5.10)
RDW: 13.5 % (ref 11.0–15.0)
Total Lymphocyte: 20.6 %
WBC: 6.2 10*3/uL (ref 3.8–10.8)

## 2018-08-11 LAB — COMPLETE METABOLIC PANEL WITH GFR
AG Ratio: 2.1 (calc) (ref 1.0–2.5)
ALT: 9 U/L (ref 6–29)
AST: 15 U/L (ref 10–35)
Albumin: 4.4 g/dL (ref 3.6–5.1)
Alkaline phosphatase (APISO): 87 U/L (ref 37–153)
BUN/Creatinine Ratio: 14 (calc) (ref 6–22)
BUN: 13 mg/dL (ref 7–25)
CO2: 30 mmol/L (ref 20–32)
Calcium: 9.5 mg/dL (ref 8.6–10.4)
Chloride: 101 mmol/L (ref 98–110)
Creat: 0.95 mg/dL — ABNORMAL HIGH (ref 0.60–0.93)
GFR, Est African American: 67 mL/min/{1.73_m2} (ref >=60)
GFR, Est Non African American: 58 mL/min/{1.73_m2} — ABNORMAL LOW (ref >=60)
Globulin: 2.1 g/dL (calc) (ref 1.9–3.7)
Glucose, Bld: 93 mg/dL (ref 65–99)
Potassium: 4.2 mmol/L (ref 3.5–5.3)
Sodium: 142 mmol/L (ref 135–146)
Total Bilirubin: 0.3 mg/dL (ref 0.2–1.2)
Total Protein: 6.5 g/dL (ref 6.1–8.1)

## 2018-08-11 LAB — URINALYSIS, ROUTINE W REFLEX MICROSCOPIC
Bilirubin Urine: NEGATIVE
Glucose, UA: NEGATIVE
Hgb urine dipstick: NEGATIVE
Ketones, ur: NEGATIVE
Leukocytes,Ua: NEGATIVE
Nitrite: NEGATIVE
Protein, ur: NEGATIVE
Specific Gravity, Urine: 1.012 (ref 1.001–1.03)
pH: 7 (ref 5.0–8.0)

## 2018-08-11 LAB — VITAMIN D 25 HYDROXY (VIT D DEFICIENCY, FRACTURES): Vit D, 25-Hydroxy: 50 ng/mL (ref 30–100)

## 2018-08-11 LAB — LIPID PANEL
Cholesterol: 217 mg/dL — ABNORMAL HIGH (ref <200)
HDL: 52 mg/dL (ref >=50)
LDL Cholesterol (Calc): 122 mg/dL (calc) — ABNORMAL HIGH
Non-HDL Cholesterol (Calc): 165 mg/dL (calc) — ABNORMAL HIGH (ref <130)
Total CHOL/HDL Ratio: 4.2 (calc) (ref <5.0)
Triglycerides: 300 mg/dL — ABNORMAL HIGH (ref <150)

## 2018-08-11 LAB — IRON, TOTAL/TOTAL IRON BINDING CAP
%SAT: 25 % (calc) (ref 16–45)
Iron: 81 ug/dL (ref 45–160)
TIBC: 329 mcg/dL (calc) (ref 250–450)

## 2018-08-11 LAB — TSH: TSH: 2.02 mIU/L (ref 0.40–4.50)

## 2018-08-11 LAB — HEMOGLOBIN A1C
Hgb A1c MFr Bld: 5.4 % of total Hgb (ref <5.7)
Mean Plasma Glucose: 108 (calc)
eAG (mmol/L): 6 (calc)

## 2018-08-11 LAB — MICROALBUMIN / CREATININE URINE RATIO
Creatinine, Urine: 42 mg/dL (ref 20–275)
Microalb Creat Ratio: 7 mcg/mg creat (ref <30)
Microalb, Ur: 0.3 mg/dL

## 2018-08-11 LAB — MAGNESIUM: Magnesium: 1.9 mg/dL (ref 1.5–2.5)

## 2018-08-11 LAB — INSULIN, RANDOM: Insulin: 9.9 u[IU]/mL

## 2018-08-11 LAB — VITAMIN B12: Vitamin B-12: 1197 pg/mL — ABNORMAL HIGH (ref 200–1100)

## 2018-08-27 ENCOUNTER — Other Ambulatory Visit: Payer: Self-pay | Admitting: Internal Medicine

## 2018-08-27 DIAGNOSIS — J438 Other emphysema: Secondary | ICD-10-CM

## 2018-08-27 DIAGNOSIS — J439 Emphysema, unspecified: Secondary | ICD-10-CM

## 2018-08-27 MED ORDER — FLUTICASONE-UMECLIDIN-VILANT 100-62.5-25 MCG/INH IN AEPB: 1.0000 | INHALATION_SPRAY | Freq: Every day | RESPIRATORY_TRACT | 3 refills | Status: DC

## 2018-08-30 ENCOUNTER — Other Ambulatory Visit: Payer: Self-pay | Admitting: Internal Medicine

## 2018-08-31 DIAGNOSIS — J9690 Respiratory failure, unspecified, unspecified whether with hypoxia or hypercapnia: Secondary | ICD-10-CM | POA: Diagnosis not present

## 2018-08-31 DIAGNOSIS — R5381 Other malaise: Secondary | ICD-10-CM | POA: Diagnosis not present

## 2018-08-31 DIAGNOSIS — I6789 Other cerebrovascular disease: Secondary | ICD-10-CM | POA: Diagnosis not present

## 2018-08-31 DIAGNOSIS — J449 Chronic obstructive pulmonary disease, unspecified: Secondary | ICD-10-CM | POA: Diagnosis not present

## 2018-09-05 ENCOUNTER — Other Ambulatory Visit: Payer: Self-pay | Admitting: *Deleted

## 2018-09-05 DIAGNOSIS — J439 Emphysema, unspecified: Secondary | ICD-10-CM

## 2018-09-05 MED ORDER — FLUTICASONE-UMECLIDIN-VILANT 100-62.5-25 MCG/INH IN AEPB: 1.0000 | INHALATION_SPRAY | Freq: Every day | RESPIRATORY_TRACT | 3 refills | Status: DC

## 2018-09-06 ENCOUNTER — Other Ambulatory Visit: Payer: Self-pay

## 2018-09-06 DIAGNOSIS — Z1212 Encounter for screening for malignant neoplasm of rectum: Secondary | ICD-10-CM

## 2018-09-06 DIAGNOSIS — Z1211 Encounter for screening for malignant neoplasm of colon: Secondary | ICD-10-CM

## 2018-09-06 LAB — POC HEMOCCULT BLD/STL (HOME/3-CARD/SCREEN)
Card #2 Fecal Occult Blod, POC: NEGATIVE
Card #3 Fecal Occult Blood, POC: NEGATIVE
Fecal Occult Blood, POC: NEGATIVE

## 2018-09-07 DIAGNOSIS — Z1211 Encounter for screening for malignant neoplasm of colon: Secondary | ICD-10-CM | POA: Diagnosis not present

## 2018-10-01 DIAGNOSIS — R5381 Other malaise: Secondary | ICD-10-CM | POA: Diagnosis not present

## 2018-10-01 DIAGNOSIS — J9690 Respiratory failure, unspecified, unspecified whether with hypoxia or hypercapnia: Secondary | ICD-10-CM | POA: Diagnosis not present

## 2018-10-01 DIAGNOSIS — J449 Chronic obstructive pulmonary disease, unspecified: Secondary | ICD-10-CM | POA: Diagnosis not present

## 2018-10-01 DIAGNOSIS — I6789 Other cerebrovascular disease: Secondary | ICD-10-CM | POA: Diagnosis not present

## 2018-10-21 ENCOUNTER — Other Ambulatory Visit: Payer: Self-pay | Admitting: Internal Medicine

## 2018-10-21 DIAGNOSIS — M549 Dorsalgia, unspecified: Secondary | ICD-10-CM

## 2018-10-31 ENCOUNTER — Other Ambulatory Visit: Payer: Self-pay

## 2018-10-31 ENCOUNTER — Encounter: Payer: Self-pay | Admitting: Physician Assistant

## 2018-10-31 ENCOUNTER — Ambulatory Visit: Payer: Medicare HMO | Admitting: Physician Assistant

## 2018-10-31 DIAGNOSIS — R5381 Other malaise: Secondary | ICD-10-CM | POA: Diagnosis not present

## 2018-10-31 DIAGNOSIS — C8231 Follicular lymphoma grade IIIa, lymph nodes of head, face, and neck: Secondary | ICD-10-CM | POA: Diagnosis not present

## 2018-10-31 DIAGNOSIS — J9690 Respiratory failure, unspecified, unspecified whether with hypoxia or hypercapnia: Secondary | ICD-10-CM | POA: Diagnosis not present

## 2018-10-31 DIAGNOSIS — I6789 Other cerebrovascular disease: Secondary | ICD-10-CM | POA: Diagnosis not present

## 2018-10-31 DIAGNOSIS — J438 Other emphysema: Secondary | ICD-10-CM | POA: Diagnosis not present

## 2018-10-31 DIAGNOSIS — J449 Chronic obstructive pulmonary disease, unspecified: Secondary | ICD-10-CM | POA: Diagnosis not present

## 2018-10-31 DIAGNOSIS — K219 Gastro-esophageal reflux disease without esophagitis: Secondary | ICD-10-CM

## 2018-10-31 MED ORDER — AZITHROMYCIN 250 MG PO TABS: ORAL_TABLET | ORAL | 1 refills | Status: AC

## 2018-10-31 MED ORDER — DEXAMETHASONE 0.5 MG PO TABS: ORAL_TABLET | ORAL | 0 refills | Status: DC

## 2018-10-31 NOTE — Progress Notes (Signed)
THIS ENCOUNTER IS A VIRTUAL VISIT DUE TO COVID-19 - PATIENT WAS NOT SEEN IN THE OFFICE.  PATIENT HAS CONSENTED TO VIRTUAL VISIT / TELEMEDICINE VISIT   Virtual Visit via telephone Note  I connected with Renee Hawkins on 10/31/2018 by telephone.  I verified that I am speaking with the correct person using two identifiers.    I discussed the limitations of evaluation and management by telemedicine and the availability of in person appointments. The patient expressed understanding and agreed to proceed.  History of Present Illness: 78 y.o. WF with history of COPD, follicular lymphoma, GERD calls with congestion and sore throat x 2-3 days. She denies fever, chills.  She states she wakes up hoarse, wears CPAP at night.  States she has always gotten out of breath with walking, has not worsened, no acoompaniments.  She walks late in afternoon with her neighbor.  She is on trelegy and albuterol inhaler.  No GERD symptoms.   Medications    Current Outpatient Medications (Respiratory):  Marland Kitchen  Fluticasone-Umeclidin-Vilant (TRELEGY ELLIPTA) 100-62.5-25 MCG/INH AEPB, Inhale 1 puff into the lungs daily. Marland Kitchen  STIOLTO RESPIMAT 2.5-2.5 MCG/ACT AERS, INHALE 2 PUFFS EVERY DAY  Current Outpatient Medications (Analgesics):  .  aspirin EC 81 MG tablet, Take 81 mg by mouth daily.   Current Outpatient Medications (Other):  Marland Kitchen  Coenzyme Q10 10 MG capsule, Take 10 mg by mouth 2 (two) times daily. Marland Kitchen  gabapentin (NEURONTIN) 300 MG capsule, Take 1 capsule 3 to 4 x /day as needed for Pain .  Magnesium 250 MG TABS, Take 250 mg by mouth daily.  .  potassium chloride (K-DUR,KLOR-CON) 10 MEQ tablet, Take 10-20 mEq by mouth See admin instructions. 8mEq in the AM and 75mEq in the PM .  vitamin C (ASCORBIC ACID) 500 MG tablet, Take 500 mg by mouth daily.  Problem list She has Essential hypertension; GERD ; Hx of adenomatous colonic polyps; Hyperlipidemia, mixed; Abnormal glucose; Vitamin D deficiency; RLS (restless  legs syndrome); Vitamin B12 deficiency; Anxiety; CKD (chronic kidney disease) stage 3, GFR 30-59 ml/min (HCC); COPD (chronic obstructive pulmonary disease) with emphysema ([place]); Medication management; OSA and COPD overlap syndrome ([place]); Osteoporosis; Follicular lymphoma grade iiia, lymph nodes of head, face, and neck ([place]); Anemia, chronic disease; Aortic atherosclerosis ([place]); Left displaced femoral neck fracture ([place]); and Lumbar radiculopathy on their problem list.   Observations/Objective: General Appearance:Well sounding, in no apparent distress.  ENT/Mouth: No hoarseness, No cough for duration of visit.  Respiratory: completing full sentences without distress, without audible wheeze Neuro: Awake and oriented X 3,  Psych:  Insight and Judgment appropriate.    Assessment and Plan:  Other emphysema ([place]) -     dexamethasone (DECADRON) 0.5 MG tablet; 1 tablet PO BID for 3 days, then take 1 tablet PO for 4 days. -     azithromycin (ZITHROMAX) 250 MG tablet; Take 2 tablets (500 mg) on  Day 1,  followed by 1 tablet (250 mg) once daily on Days 2 through 5. DX. J02.9 Will hold the zpak and take if she is not getting better, increase fluids, rest, cont allergy pill URI versus COVID Currently mild symptoms Suggested symptomatic OTC remedies. Nasal saline spray for congestion. Nasal steroids, allergy pill, staying hydrated Follow up as needed via mychart or text.. Will do self isolation at home Patient understands will not break quarantine without my permission.   Gastroesophageal reflux disease, esophagitis presence not specified  Follicular lymphoma grade iiia, lymph nodes of head, face, and neck ([place])  If not better, will    Follow Up Instructions:  I discussed the assessment and treatment plan with the patient. The patient was provided an opportunity to ask questions and all were answered. The patient agreed with the plan and demonstrated an understanding of the instructions.   The  patient was advised to call back or seek an in-person evaluation if the symptoms worsen or if the condition fails to improve as anticipated.  I provided 15 minutes of non-face-to-face time during this encounter.   Vicie Mutters, PA-C

## 2018-10-31 NOTE — Patient Instructions (Addendum)
Do the albuterol before your walks  ALLERGY MEDICATIONS OVER THE COUNTER  Please pick one of the over the counter allergy medications below and take it once daily for allergies.  Claritin or loratadine cheapest but likely the weakest  Zyrtec or certizine at night because it can make you sleepy The strongest is allegra or fexafinadine  Cheapest at [employer], [employer], [employer]  Go to the ER if any chest pain, shortness of breath, nausea, dizziness, severe HA, changes vision/speech   The testing sites are open from 8-4, Monday-Friday. Due to the testing being walk-up/drive-up the sites request that the pt's are in line to have testing by 3:30 pm. The pt's will remain in the car and wear a mask when going for testing.The staff will come to the car to perform testing. The pt's will need to bring an ID and insurance card if they have one.   [place] ([place])  [facility]   The testing sites are also listed on the [employer] as well.     Chronic Obstructive Pulmonary Disease Exacerbation  Chronic obstructive pulmonary disease (COPD) is a long-term (chronic) condition that affects the lungs. COPD is a general term that can be used to describe many different lung problems that cause lung swelling (inflammation) and limit airflow, including chronic bronchitis and emphysema. COPD exacerbations are episodes when breathing symptoms become much worse and require extra treatment. COPD exacerbations are usually caused by infections. Without treatment, COPD exacerbations can be severe and even life threatening. Frequent COPD exacerbations can cause further damage to the lungs. What are the causes? This condition may be caused by:  Respiratory infections, including viral and bacterial infections.  Exposure to smoke.  Exposure to air pollution, chemical fumes, or  dust.  Things that give you an allergic reaction (allergens).  Not taking your usual COPD medicines as directed.  Underlying medical problems, such as congestive heart failure or infections not involving the lungs. In many cases, the cause (trigger) of this condition is not known. What increases the risk? The following factors may make you more likely to develop this condition:  Smoking cigarettes.  Old age.  Frequent prior COPD exacerbations. What are the signs or symptoms? Symptoms of this condition include:  Increased coughing.  Increased production of mucus from your lungs (sputum).  Increased wheezing.  Increased shortness of breath.  Rapid or labored breathing.  Chest tightness.  Less energy than usual.  Sleep disruption from symptoms.  Confusion or increased sleepiness. Often these symptoms happen or get worse even with the use of medicines. How is this diagnosed? This condition is diagnosed based on:  Your medical history.  A physical exam. You may also have tests, including:  A chest X-ray.  Blood tests.  Lung (pulmonary) function tests. How is this treated? Treatment for this condition depends on the severity and cause of the symptoms. You may need to be admitted to a hospital for treatment. Some of the treatments commonly used to treat COPD exacerbations are:  Antibiotic medicines. These may be used for severe exacerbations caused by a lung infection, such as pneumonia.  Bronchodilators. These are inhaled medicines that expand the air passages and allow increased airflow.  Steroid medicines. These act to reduce inflammation in the airways. They may be given with an inhaler, taken by mouth, or given through an IV tube inserted into one of your veins.  Supplemental oxygen therapy.  Airway clearing techniques, such as noninvasive ventilation (NIV) and positive expiratory pressure (PEP). These provide respiratory support through a mask or other  noninvasive device. An example of this would be using a continuous positive airway pressure (CPAP) machine to improve delivery of oxygen into your lungs. Follow these instructions at home: Medicines  Take over-the-counter and prescription medicines only as told by your health care provider. It is important to use correct technique with inhaled medicines.  If you were prescribed an antibiotic medicine or oral steroid, take it as told by your health care provider. Do not stop taking the medicine even if you start to feel better. Lifestyle  Eat a healthy diet.  Exercise regularly.  Get plenty of sleep.  Avoid exposure to all substances that irritate the airway, especially to tobacco smoke.  Wash your hands often with soap and water to reduce the risk of infection. If soap and water are not available, use hand sanitizer.  During flu season, avoid enclosed spaces that are crowded with people. General instructions  Drink enough fluid to keep your urine clear or pale yellow (unless you have a medical condition that requires fluid restriction).  Use a cool mist vaporizer. This humidifies the air and makes it easier for you to clear your chest when you cough.  If you have a home nebulizer and oxygen, continue to use them as told by your health care provider.  Keep all follow-up visits as told by your health care provider. This is important. How is this prevented?  Stay up-to-date on pneumococcal and influenza (flu) vaccines. A flu shot is recommended every year to help prevent exacerbations.  Do not use any products that contain nicotine or tobacco, such as cigarettes and e-cigarettes. Quitting smoking is very important in preventing COPD from getting worse and in preventing exacerbations from happening as often. If you need help quitting, ask your health care provider.  Follow all instructions for pulmonary rehabilitation after a recent exacerbation. This can help prevent future  exacerbations.  Work with your health care provider to develop and follow an action plan. This tells you what steps to take when you experience certain symptoms. Contact a health care provider if:  You have a worsening of your regular COPD symptoms. Get help right away if:  You have worsening shortness of breath, even when resting.  You have trouble talking.  You have severe chest pain.  You cough up blood.  You have a fever.  You have weakness, vomit repeatedly, or faint.  You feel confused.  You are not able to sleep because of your symptoms.  You have trouble doing daily activities. Summary  COPD exacerbations are episodes when breathing symptoms become much worse and require extra treatment above your normal treatment.  Exacerbations can be severe and even life threatening. Frequent COPD exacerbations can cause further damage to your lungs.  COPD exacerbations are usually triggered by infections such as the flu, colds, and even pneumonia.  Treatment for this condition depends on the severity and cause of the symptoms. You may need to be admitted to a hospital for treatment.  Quitting smoking is very important to prevent COPD from getting worse and to prevent exacerbations from happening as often. This information is not intended to replace advice given to you by your health care provider. Make sure you discuss any questions you have with your health care provider. Document Released: 01/18/2007 Document Revised: 03/05/2017 Document Reviewed: 04/27/2016 Elsevier Patient Education  2020 [employer].

## 2018-11-04 ENCOUNTER — Other Ambulatory Visit: Payer: Self-pay

## 2018-11-04 ENCOUNTER — Inpatient Hospital Stay: Payer: Medicare HMO | Attending: Hematology and Oncology | Admitting: Hematology and Oncology

## 2018-11-04 ENCOUNTER — Encounter: Payer: Self-pay | Admitting: Hematology and Oncology

## 2018-11-04 DIAGNOSIS — J439 Emphysema, unspecified: Secondary | ICD-10-CM | POA: Diagnosis not present

## 2018-11-04 DIAGNOSIS — Z9221 Personal history of antineoplastic chemotherapy: Secondary | ICD-10-CM

## 2018-11-04 DIAGNOSIS — C8231 Follicular lymphoma grade IIIa, lymph nodes of head, face, and neck: Secondary | ICD-10-CM | POA: Diagnosis not present

## 2018-11-04 DIAGNOSIS — Z79899 Other long term (current) drug therapy: Secondary | ICD-10-CM | POA: Insufficient documentation

## 2018-11-04 DIAGNOSIS — I700 Atherosclerosis of aorta: Secondary | ICD-10-CM | POA: Diagnosis not present

## 2018-11-04 DIAGNOSIS — I2510 Atherosclerotic heart disease of native coronary artery without angina pectoris: Secondary | ICD-10-CM | POA: Diagnosis not present

## 2018-11-04 NOTE — Assessment & Plan Note (Signed)
Clinically, she has no signs of lymphoma Her recent blood count monitoring through her PCP office in May was within normal limits Clinical exam is benign I plan to see her again in 6 months for further follow-up Recommend annual influenza vaccination

## 2018-11-04 NOTE — Progress Notes (Signed)
[facility] OFFICE PROGRESS NOTE  Patient Care Team: Unk Pinto, MD as PCP - General (Internal Medicine) Lafayette Dragon, MD (Inactive) as Consulting Physician (Gastroenterology) Loletha Carrow, MD (Pulmonary Disease) Heath Lark, MD as Consulting Physician (Hematology and Oncology) Rozetta Nunnery, MD as Consulting Physician (Otolaryngology) Chales Salmon, OD as Consulting Physician (Optometry)  ASSESSMENT & PLAN:  Follicular lymphoma grade iiia, lymph nodes of head, face, and neck ([place]) Clinically, she has no signs of lymphoma Her recent blood count monitoring through her PCP office in May was within normal limits Clinical exam is benign I plan to see her again in 6 months for further follow-up Recommend annual influenza vaccination   No orders of the defined types were placed in this encounter.   INTERVAL HISTORY: Please see below for problem oriented charting. She returns for further follow-up She feels well No new lymphadenopathy Her appetite is stable Denies recent weight loss  SUMMARY OF ONCOLOGIC HISTORY: Oncology History  Follicular lymphoma grade iiia, lymph nodes of head, face, and neck ([place])  01/29/2016 Imaging   Ct neck showed right submandibular lymphadenopathy and asymmetric prominence of jugular chain nodes. Metastatic disease is the primary diagnostic concern and submandibular node sampling is recommended. No primary lesion is seen, limited at the oral cavity due to dental artifact. 2. Emphysema   02/04/2016 Pathology Results   Accession: [id number] Biopsy showed high grade follicular lymphoma   63/78/5885 PET scan   Hypermetabolic right submandibular and right level II/III lymph nodes, consistent with the given history of lymphoma. No hypermetabolic lymph nodes in the chest, abdomen or pelvis. Aortic atherosclerosis (ICD10-170.0). Coronary artery calcification. Emphysema (ICD10-J43.9).   02/24/2016 Imaging   ECHO showed LV  The cavity size was normal. Systolic function was   normal. The estimated ejection fraction was in the range of 55% to 60%. Wall motion was normal; there were no regional wall motion abnormalities. Doppler parameters are consistent with abnormal left ventricular relaxation (grade 1 diastolic dysfunction).   03/13/2016 - 04/24/2016 Chemotherapy   The patient had R-CHOP chemo x 3 cycles   05/27/2016 PET scan   No metabolic activity associated with the RIGHT submandibular gland or RIGHT cervical lymph nodes consistent with a complete response. 2. Bilateral pulmonary nodules are not changed from comparison exam. 3. Diffuse mild esophagus metabolic activity suggests mild esophagitis.   06/12/2016 - 04/04/2018 Chemotherapy   The patient had consolidation treatment with maintenance Rituximab   05/05/2018 Imaging   1. No findings of active lymphoma in the chest, abdomen, or pelvis. No splenomegaly or focal splenic lesion. 2. Other imaging findings of potential clinical significance: Aortic Atherosclerosis (ICD10-I70.0) and Emphysema (ICD10-J43.9). Coronary atherosclerosis. Small type 1 hiatal hernia. Chronic small basilar pulmonary nodules, long-term chronicity indicating benign etiology. Stable appearance of T12 anterior wedge compression. Cholelithiasis. Descending and sigmoid colon diverticulosis. Prominent stool throughout the colon favors constipation.     REVIEW OF SYSTEMS:   Constitutional: Denies fevers, chills or abnormal weight loss Eyes: Denies blurriness of vision Ears, nose, mouth, throat, and face: Denies mucositis or sore throat Respiratory: Denies cough, dyspnea or wheezes Cardiovascular: Denies palpitation, chest discomfort or lower extremity swelling Gastrointestinal:  Denies nausea, heartburn or change in bowel habits Skin: Denies abnormal skin rashes Lymphatics: Denies new lymphadenopathy or easy bruising Neurological:Denies numbness, tingling or new weaknesses Behavioral/Psych:  Mood is stable, no new changes  All other systems were reviewed with the patient and are negative.  I have reviewed the past medical history, past surgical history, social  history and family history with the patient and they are unchanged from previous note.  ALLERGIES:  has no active allergies.  MEDICATIONS:  Current Outpatient Medications  Medication Sig Dispense Refill  . aspirin EC 81 MG tablet Take 81 mg by mouth daily.    Marland Kitchen azithromycin (ZITHROMAX) 250 MG tablet Take 2 tablets (500 mg) on  Day 1,  followed by 1 tablet (250 mg) once daily on Days 2 through 5. DX. J02.9 6 each 1  . Coenzyme Q10 10 MG capsule Take 10 mg by mouth 2 (two) times daily.    Marland Kitchen dexamethasone (DECADRON) 0.5 MG tablet 1 tablet PO BID for 3 days, then take 1 tablet PO for 4 days. 10 tablet 0  . Fluticasone-Umeclidin-Vilant (TRELEGY ELLIPTA) 100-62.5-25 MCG/INH AEPB Inhale 1 puff into the lungs daily. 180 each 3  . gabapentin (NEURONTIN) 300 MG capsule Take 1 capsule 3 to 4 x /day as needed for Pain 360 capsule 1  . Magnesium 250 MG TABS Take 250 mg by mouth daily.     . potassium chloride (K-DUR,KLOR-CON) 10 MEQ tablet Take 10-20 mEq by mouth See admin instructions. 24mEq in the AM and 37mEq in the PM    . STIOLTO RESPIMAT 2.5-2.5 MCG/ACT AERS INHALE 2 PUFFS EVERY DAY 12 g 0  . vitamin C (ASCORBIC ACID) 500 MG tablet Take 500 mg by mouth daily.     No current facility-administered medications for this visit.     PHYSICAL EXAMINATION: ECOG PERFORMANCE STATUS: 0 - Asymptomatic  Vitals:   11/04/18 0904  BP: 130/62  Pulse: 73  Resp: 18  Temp: 98.7 F (37.1 C)  SpO2: 95%   Filed Weights   11/04/18 0904  Weight: 110 lb 9.6 oz (50.2 kg)    GENERAL:alert, no distress and comfortable SKIN: skin color, texture, turgor are normal, no rashes or significant lesions EYES: normal, Conjunctiva are pink and non-injected, sclera clear OROPHARYNX:no exudate, no erythema and lips, buccal mucosa, and tongue normal   NECK: supple, thyroid normal size, non-tender, without nodularity LYMPH:  no palpable lymphadenopathy in the cervical, axillary or inguinal LUNGS: clear to auscultation and percussion with normal breathing effort HEART: regular rate & rhythm and no murmurs and no lower extremity edema ABDOMEN:abdomen soft, non-tender and normal bowel sounds Musculoskeletal:no cyanosis of digits and no clubbing  NEURO: alert & oriented x 3 with fluent speech, no focal motor/sensory deficits  LABORATORY DATA:  I have reviewed the data as listed    Component Value Date/Time   NA 142 08/10/2018 1622   NA 142 02/12/2017 0809   K 4.2 08/10/2018 1622   K 4.0 02/12/2017 0809   CL 101 08/10/2018 1622   CO2 30 08/10/2018 1622   CO2 28 02/12/2017 0809   GLUCOSE 93 08/10/2018 1622   GLUCOSE 97 02/12/2017 0809   BUN 13 08/10/2018 1622   BUN 11.2 02/12/2017 0809   CREATININE 0.95 (H) 08/10/2018 1622   CREATININE 0.9 02/12/2017 0809   CALCIUM 9.5 08/10/2018 1622   CALCIUM 9.3 02/12/2017 0809   PROT 6.5 08/10/2018 1622   PROT 6.7 02/12/2017 0809   ALBUMIN 3.8 05/05/2018 1033   ALBUMIN 3.8 02/12/2017 0809   AST 15 08/10/2018 1622   AST 16 02/12/2017 0809   ALT 9 08/10/2018 1622   ALT 15 02/12/2017 0809   ALKPHOS 87 05/05/2018 1033   ALKPHOS 88 02/12/2017 0809   BILITOT 0.3 08/10/2018 1622   BILITOT 0.40 02/12/2017 0809   GFRNONAA 58 (L) 08/10/2018 1622  GFRAA 67 08/10/2018 1622    No results found for: SPEP, UPEP  Lab Results  Component Value Date   WBC 6.2 08/10/2018   NEUTROABS 4,309 08/10/2018   HGB 12.9 08/10/2018   HCT 38.0 08/10/2018   MCV 93.4 08/10/2018   PLT 217 08/10/2018      Chemistry      Component Value Date/Time   NA 142 08/10/2018 1622   NA 142 02/12/2017 0809   K 4.2 08/10/2018 1622   K 4.0 02/12/2017 0809   CL 101 08/10/2018 1622   CO2 30 08/10/2018 1622   CO2 28 02/12/2017 0809   BUN 13 08/10/2018 1622   BUN 11.2 02/12/2017 0809   CREATININE 0.95 (H) 08/10/2018  1622   CREATININE 0.9 02/12/2017 0809      Component Value Date/Time   CALCIUM 9.5 08/10/2018 1622   CALCIUM 9.3 02/12/2017 0809   ALKPHOS 87 05/05/2018 1033   ALKPHOS 88 02/12/2017 0809   AST 15 08/10/2018 1622   AST 16 02/12/2017 0809   ALT 9 08/10/2018 1622   ALT 15 02/12/2017 0809   BILITOT 0.3 08/10/2018 1622   BILITOT 0.40 02/12/2017 0809      All questions were answered. The patient knows to call the clinic with any problems, questions or concerns. No barriers to learning was detected.  I spent 10 minutes counseling the patient face to face. The total time spent in the appointment was 15 minutes and more than 50% was on counseling and review of test results  Heath Lark, MD 11/04/2018 2:25 PM

## 2018-11-07 NOTE — Progress Notes (Signed)
Medicare wellness visit and follow up.   Assessment:   Encounter for annual medicare wellness visit  Atherosclerosis of aorta Control blood pressure, cholesterol, glucose, increase exercise.   Essential hypertension - continue medications, DASH diet, exercise and monitor at home. Call if greater than 130/80.  - CBC with Differential/Platelet - CMP/GFR - TSH  OSA with COPD overlap Sleep apnea- continue CPAP, weight loss advised.   Pulmonary emphysema, unspecified emphysema type Follow up pulmonary, continue inhaler.   Follicular lymphoma S/p treatment, followed by Dr. Alvy Bimler  Other abnormal glucose  Recent A1Cs at goal Discussed diet/exercise, weight management  Defer A1C; check CMP  CKD (chronic kidney disease) stage 3 Increase fluids, avoid NSAIDS, monitor sugars, will monitor - CMP WITH GFR  Hyperlipidemia -continue medications, check lipids, decrease fatty foods, increase activity.  - Lipid panel  Vitamin D deficiency - Vit D  25 hydroxy (rtn osteoporosis monitoring)   Medication management - Magnesium   Hypomagnesemia  Vitamin B12 deficiency Continue supplement  Gastroesophageal reflux disease with esophagitis Continue PPI/H2 blocker, diet discussed   RLS (restless legs syndrome) Exercise   Hx of adenomatous colonic polyps Up to date  Anxiety remission   Osteoporosis Will refer for possible prolia start  Future Appointments  Date Time Provider [place]  02/15/2019  2:30 PM Unk Pinto, MD [facility] None  05/08/2019  9:30 AM Heath Lark, MD [facility] None  08/31/2019  3:00 PM Unk Pinto, MD [facility] None     Plan:   During the course of the visit the patient was educated and counseled about appropriate screening and preventive services including:    Pneumococcal vaccine   Influenza vaccine  Td vaccine  Screening electrocardiogram  Screening mammography  Bone densitometry screening  Colorectal cancer  screening  Diabetes screening  Glaucoma screening  Nutrition counseling   .  Subjective:   Renee Hawkins is a 78 y.o. female who presents for Medicare Annual Wellness Visit and follow up.   She had treatment in October 31 2018, she was treated with zpak/decadron and was feeling better, never got tested.   Patient is s/p treatment follicular lymphoma, right neck, following oncology Dr. Alvy Bimler.   Dr. Felix Ahmadi, OD earlier this year had abnormal left optic nerve, had negative CBC, ESR, CRP and negative MRI of brain and orbits.   She has COPD with OSA, on CPAP and respimet, trelegy was not as effective and cost more, wears CPAP nightly, 6-8 hours, states it is helping with breathing and energy, well controlled. Follows with Dr. Welford Roche PRN.    Had Dexa 04/2017, went from -3.5 to -3.2, on fosamax x 2016.   She is on potassium twice a day or 47mq total.   BMI is Body mass index is 20.56 kg/m., she has been working on diet and exercise. Wt Readings from Last 3 Encounters:  11/14/18 112 lb 6.4 oz (51 kg)  11/04/18 110 lb 9.6 oz (50.2 kg)  08/10/18 116 lb (52.6 kg)   Her blood pressure has been controlled at home, today their BP is BP: 124/88 She does workout, still at [employer]. Walking with a neighbor, lives byself.  She denies chest pain, shortness of breath, dizziness.   She is on cholesterol medication, she is not on a cholesterol medication. Her cholesterol is not at goal. The cholesterol last visit was:   Lab Results  Component Value Date   CHOL 217 (H) 08/10/2018   HDL 52 08/10/2018   LDLCALC 122 (H) 08/10/2018   TRIG  300 (H) 08/10/2018   CHOLHDL 4.2 08/10/2018   Lab Results  Component Value Date   GFRNONAA 58 (L) 08/10/2018   She has been working on diet and exercise for glucose management, and denies nausea, paresthesia of the feet and polydipsia. Last A1C in the office was:  Lab Results  Component Value Date   HGBA1C 5.4 08/10/2018   Patient is  on Vitamin D supplement.   Lab Results  Component Value Date   VD25OH 50 08/10/2018     Lab Results  Component Value Date   GFRNONAA 58 (L) 08/10/2018      Medication Review   Current Outpatient Medications (Cardiovascular):  .  rosuvastatin (CRESTOR) 5 MG tablet, Take 1 tablet (5 mg total) by mouth at bedtime.  Current Outpatient Medications (Respiratory):  Marland Kitchen  Tiotropium Bromide-Olodaterol (STIOLTO RESPIMAT) 2.5-2.5 MCG/ACT AERS, Inhale 2 puffs into the lungs daily.  Current Outpatient Medications (Analgesics):  .  aspirin EC 81 MG tablet, Take 81 mg by mouth daily.   Current Outpatient Medications (Other):  Marland Kitchen  Coenzyme Q10 10 MG capsule, Take 10 mg by mouth 2 (two) times daily. Marland Kitchen  gabapentin (NEURONTIN) 300 MG capsule, Take 1 capsule 3 to 4 x /day as needed for Pain .  Magnesium 250 MG TABS, Take 250 mg by mouth daily.  .  potassium chloride (K-DUR,KLOR-CON) 10 MEQ tablet, Take 10-20 mEq by mouth See admin instructions. 24mq in the AM and 245m in the PM .  vitamin C (ASCORBIC ACID) 500 MG tablet, Take 500 mg by mouth daily.  Current Problems (verified) Patient Active Problem List   Diagnosis Date Noted  . Left displaced femoral neck fracture ([facility]/15/2019  . Lumbar radiculopathy 01/18/2018  . Aortic atherosclerosis ([facility]/10/2017  . Anemia, chronic disease 12/13/2017  . Follicular lymphoma grade iiia, lymph nodes of head, face, and neck ([facility]11/10/2015  . OSA and COPD overlap syndrome ([facility]08/31/2016  . Osteoporosis 12/05/2014  . Medication management 03/06/2014  . CKD (chronic kidney disease) stage 3, GFR 30-59 ml/min ([facility]) 11/27/2013  . COPD (chronic obstructive pulmonary disease) with emphysema ([facility]08/24/2015  . Essential hypertension   . GERD    . Hx of adenomatous colonic polyps   . Hyperlipidemia, mixed   . Abnormal glucose   . Vitamin D deficiency   . RLS (restless legs syndrome)   . Vitamin B12 deficiency   . Anxiety     Screening  Tests Immunization History  Administered Date(s) Administered  . Influenza Split 01/18/2013  . Influenza, High Dose Seasonal PF 12/05/2014, 01/23/2016, 12/22/2016, 01/11/2018  . Influenza-Unspecified 01/17/2018  . Pneumococcal Conjugate-13 11/27/2013  . Pneumococcal Polysaccharide-23 11/19/2011  . Td 08/13/2008  . Zoster 04/06/2010   Preventative care: Last colonoscopy: 05/2014 Last mammogram: 04/2017 going to get 04/2019 Last pap smear/pelvic exam: Done per GYN DEXA: Started fosamax 2016, off 2020, 04/2017 T -3.2 CT chest 04/2018, stable pulmonary nodule CT neck 01/29/2016 Echo 02/24/2016  Prior vaccinations: Tdap: TODAY Influenza: 2018 Pneumonia: 2013 Prevnar: 2015 Shingles: 2012  Names of Other Physician/Practitioners you currently use: 1. [facility]- here for primary care 2. [facility] eye doctor, 2019 3. dentist, last visit as needed 2017, needs to schedule  Patient Care Team: McUnk PintoMD as PCP - General (Internal Medicine) BrLafayette DragonMD (Inactive) as Consulting Physician (Gastroenterology) DoLoletha CarrowMD (Pulmonary Disease) GoHeath LarkMD as Consulting Physician (Hematology and Oncology) NeRozetta NunneryMD as Consulting Physician (Otolaryngology)  Chales Salmon, OD as Consulting Physician (Optometry)   Allergies No Active Allergies  SURGICAL HISTORY She  has a past surgical history that includes Appendectomy; Tonsillectomy; Colonoscopy (N/A, 05/18/2014); Lymph node biopsy (Right, 02/04/2016); Direct laryngoscopy (Right, 02/04/2016); Total hip arthroplasty (Left, 10/21/2017); and Cataract extraction, bilateral (2017). FAMILY HISTORY Her family history includes Stroke in her maternal grandmother and mother. SOCIAL HISTORY She  reports that she quit smoking about 6 years ago. Her smoking use included cigarettes. She has a 30.00 pack-year smoking history. She has never used smokeless  tobacco. She reports that she does not drink alcohol or use drugs.  MEDICARE WELLNESS OBJECTIVES: Physical activity:   Cardiac risk factors: Cardiac Risk Factors include: advanced age (>10mn, >>68women);dyslipidemia;hypertension;sedentary lifestyle Depression/mood screen:   Dep[facility] screen [facility] 11/14/2018  Decreased Interest 0  Down, Depressed, Hopeless 0  PHQ - 2 Score 0    ADLs:  In your present state of health, do you have any difficulty performing the following activities: 11/14/2018 08/09/2018  Hearing? N N  Vision? N N  Difficulty concentrating or making decisions? N N  Walking or climbing stairs? N N  Dressing or bathing? N N  Doing errands, shopping? N N  Some recent data might be hidden    Cognitive Testing  Alert? Yes  Normal Appearance?Yes  Oriented to person? Yes  Place? Yes   Time? Yes  Recall of three objects?  2/3  Can perform simple calculations? Yes  Displays appropriate judgment?Yes  Can read the correct time from a watch face?Yes  EOL planning: Does Patient Have a Medical Advance Directive?: Yes Type of Advance Directive: Healthcare Power of Attorney, Living will Does patient want to make changes to medical advance directive?: No - Patient declined Copy of HIndependencein Chart?: No - copy requested  Review of Systems  Constitutional: Negative.   HENT: Negative.        + Snoring, better on CPAP  Respiratory: Positive for shortness of breath (improved with CPAP, not using O2 anymore). Negative for cough, hemoptysis, sputum production and wheezing.   Cardiovascular: Negative.   Gastrointestinal: Negative for abdominal pain, blood in stool, constipation, diarrhea, heartburn, melena, nausea and vomiting.  Genitourinary: Negative for dysuria, flank pain, frequency, hematuria and urgency.  Musculoskeletal: Negative.  Negative for falls.  Skin: Negative.   Neurological: Positive for sensory change (bilateral legs). Negative for dizziness,  tingling, tremors, speech change, focal weakness, seizures and loss of consciousness.  Psychiatric/Behavioral: Negative.  Negative for depression.    Objective:     Blood pressure 124/88, pulse 88, temperature (!) 97.2 F (36.2 C), height 5' 2"  (1.575 m), weight 112 lb 6.4 oz (51 kg), SpO2 99 %. Body mass index is 20.56 kg/m.  General appearance: alert, no distress, WD/WN,  female HEENT: normocephalic, sclerae anicteric, TMs pearly, nares patent, no discharge or erythema, pharynx normal Oral cavity: MMM, no lesions Neck: supple,  no thyromegaly, no masses Heart: RRR, normal S1, S2, no murmurs Lungs: CTA bilaterally, no wheezes, rhonchi, or rales Abdomen: +bs, soft, non tender, no rebound, distended, no masses, no hepatomegaly, no splenomegaly Musculoskeletal: nontender, no swelling, no obvious deformity Extremities: no edema, no cyanosis, no clubbing Pulses: 2+ symmetric, upper and lower extremities, normal cap refill Neurological: alert, oriented x 3, CN2-12 intact, strength normal upper extremities and lower extremities, sensation normal throughout, DTRs 2+ throughout, no cerebellar signs, gait normal Psychiatric: normal affect, behavior normal, pleasant  Breast: defer Gyn: defer  Rectal:   Medicare Attestation I  have personally reviewed: The patient's medical and social history Their use of alcohol, tobacco or illicit drugs Their current medications and supplements The patient's functional ability including ADLs,fall risks, home safety risks, cognitive, and hearing and visual impairment Diet and physical activities Evidence for depression or mood disorders  The patient's weight, height, BMI, and visual acuity have been recorded in the chart.  I have made referrals, counseling, and provided education to the patient based on review of the above and I have provided the patient with a written personalized care plan for preventive services.     Vicie Mutters, PA-C   11/14/2018

## 2018-11-14 ENCOUNTER — Ambulatory Visit (INDEPENDENT_AMBULATORY_CARE_PROVIDER_SITE_OTHER): Payer: Medicare HMO | Admitting: Physician Assistant

## 2018-11-14 ENCOUNTER — Other Ambulatory Visit: Payer: Self-pay

## 2018-11-14 ENCOUNTER — Encounter: Payer: Self-pay | Admitting: Physician Assistant

## 2018-11-14 VITALS — BP 124/88 | HR 88 | Temp 97.2°F | Ht 62.0 in | Wt 112.4 lb

## 2018-11-14 DIAGNOSIS — E782 Mixed hyperlipidemia: Secondary | ICD-10-CM | POA: Diagnosis not present

## 2018-11-14 DIAGNOSIS — N183 Chronic kidney disease, stage 3 unspecified: Secondary | ICD-10-CM

## 2018-11-14 DIAGNOSIS — R6889 Other general symptoms and signs: Secondary | ICD-10-CM

## 2018-11-14 DIAGNOSIS — G4733 Obstructive sleep apnea (adult) (pediatric): Secondary | ICD-10-CM

## 2018-11-14 DIAGNOSIS — Z0001 Encounter for general adult medical examination with abnormal findings: Secondary | ICD-10-CM

## 2018-11-14 DIAGNOSIS — J449 Chronic obstructive pulmonary disease, unspecified: Secondary | ICD-10-CM

## 2018-11-14 DIAGNOSIS — D638 Anemia in other chronic diseases classified elsewhere: Secondary | ICD-10-CM

## 2018-11-14 DIAGNOSIS — M5416 Radiculopathy, lumbar region: Secondary | ICD-10-CM

## 2018-11-14 DIAGNOSIS — J438 Other emphysema: Secondary | ICD-10-CM

## 2018-11-14 DIAGNOSIS — R7309 Other abnormal glucose: Secondary | ICD-10-CM | POA: Diagnosis not present

## 2018-11-14 DIAGNOSIS — Z79899 Other long term (current) drug therapy: Secondary | ICD-10-CM | POA: Diagnosis not present

## 2018-11-14 DIAGNOSIS — I10 Essential (primary) hypertension: Secondary | ICD-10-CM | POA: Diagnosis not present

## 2018-11-14 DIAGNOSIS — E538 Deficiency of other specified B group vitamins: Secondary | ICD-10-CM | POA: Diagnosis not present

## 2018-11-14 DIAGNOSIS — C8231 Follicular lymphoma grade IIIa, lymph nodes of head, face, and neck: Secondary | ICD-10-CM | POA: Diagnosis not present

## 2018-11-14 DIAGNOSIS — E559 Vitamin D deficiency, unspecified: Secondary | ICD-10-CM

## 2018-11-14 DIAGNOSIS — M816 Localized osteoporosis [Lequesne]: Secondary | ICD-10-CM

## 2018-11-14 DIAGNOSIS — Z86010 Personal history of colonic polyps: Secondary | ICD-10-CM

## 2018-11-14 DIAGNOSIS — I700 Atherosclerosis of aorta: Secondary | ICD-10-CM

## 2018-11-14 DIAGNOSIS — S72002A Fracture of unspecified part of neck of left femur, initial encounter for closed fracture: Secondary | ICD-10-CM

## 2018-11-14 DIAGNOSIS — G2581 Restless legs syndrome: Secondary | ICD-10-CM

## 2018-11-14 DIAGNOSIS — Z0000 Encounter for general adult medical examination without abnormal findings: Secondary | ICD-10-CM

## 2018-11-14 DIAGNOSIS — F419 Anxiety disorder, unspecified: Secondary | ICD-10-CM

## 2018-11-14 DIAGNOSIS — K219 Gastro-esophageal reflux disease without esophagitis: Secondary | ICD-10-CM

## 2018-11-14 MED ORDER — ROSUVASTATIN CALCIUM 5 MG PO TABS: 5.0000 mg | ORAL_TABLET | Freq: Every day | ORAL | 1 refills | Status: DC

## 2018-11-14 MED ORDER — STIOLTO RESPIMAT 2.5-2.5 MCG/ACT IN AERS: 2.0000 | INHALATION_SPRAY | Freq: Every day | RESPIRATORY_TRACT | 3 refills | Status: DC

## 2018-11-14 NOTE — Patient Instructions (Signed)
Start crestor 5 mg daily   High Cholesterol  High cholesterol is a condition in which the blood has high levels of a white, waxy, fat-like substance (cholesterol). The human body needs small amounts of cholesterol. The liver makes all the cholesterol that the body needs. Extra (excess) cholesterol comes from the food that we eat. Cholesterol is carried from the liver by the blood through the blood vessels. If you have high cholesterol, deposits (plaques) may build up on the walls of your blood vessels (arteries). Plaques make the arteries narrower and stiffer. Cholesterol plaques increase your risk for heart attack and stroke. Work with your health care provider to keep your cholesterol levels in a healthy range. What increases the risk? This condition is more likely to develop in people who:  Eat foods that are high in animal fat (saturated fat) or cholesterol.  Are overweight.  Are not getting enough exercise.  Have a family history of high cholesterol. What are the signs or symptoms? There are no symptoms of this condition. How is this diagnosed? This condition may be diagnosed from the results of a blood test.  If you are older than age 54, your health care provider may check your cholesterol every 4-6 years.  You may be checked more often if you already have high cholesterol or other risk factors for heart disease. The blood test for cholesterol measures:  "Bad" cholesterol (LDL cholesterol). This is the main type of cholesterol that causes heart disease. The desired level for LDL is less than 100.  "Good" cholesterol (HDL cholesterol). This type helps to protect against heart disease by cleaning the arteries and carrying the LDL away. The desired level for HDL is 60 or higher.  Triglycerides. These are fats that the body can store or burn for energy. The desired number for triglycerides is lower than 150.  Total cholesterol. This is a measure of the total amount of cholesterol  in your blood, including LDL cholesterol, HDL cholesterol, and triglycerides. A healthy number is less than 200. How is this treated? This condition is treated with diet changes, lifestyle changes, and medicines. Diet changes  This may include eating more whole grains, fruits, vegetables, nuts, and fish.  This may also include cutting back on red meat and foods that have a lot of added sugar. Lifestyle changes  Changes may include getting at least 40 minutes of aerobic exercise 3 times a week. Aerobic exercises include walking, biking, and swimming. Aerobic exercise along with a healthy diet can help you maintain a healthy weight.  Changes may also include quitting smoking. Medicines  Medicines are usually given if diet and lifestyle changes have failed to reduce your cholesterol to healthy levels.  Your health care provider may prescribe a statin medicine. Statin medicines have been shown to reduce cholesterol, which can reduce the risk of heart disease. Follow these instructions at home: Eating and drinking If told by your health care provider:  Eat chicken (without skin), fish, veal, shellfish, ground [place] breast, and round or loin cuts of red meat.  Do not eat fried foods or fatty meats, such as hot dogs and salami.  Eat plenty of fruits, such as apples.  Eat plenty of vegetables, such as broccoli, potatoes, and carrots.  Eat beans, peas, and lentils.  Eat grains such as barley, rice, couscous, and bulgur wheat.  Eat pasta without cream sauces.  Use skim or nonfat milk, and eat low-fat or nonfat yogurt and cheeses.  Do not eat or drink  whole milk, cream, ice cream, egg yolks, or hard cheeses.  Do not eat stick margarine or tub margarines that contain trans fats (also called partially hydrogenated oils).  Do not eat saturated tropical oils, such as coconut oil and palm oil.  Do not eat cakes, cookies, crackers, or other baked goods that contain trans fats.  General  instructions  Exercise as directed by your health care provider. Increase your activity level with activities such as gardening, walking, and taking the stairs.  Take over-the-counter and prescription medicines only as told by your health care provider.  Do not use any products that contain nicotine or tobacco, such as cigarettes and e-cigarettes. If you need help quitting, ask your health care provider.  Keep all follow-up visits as told by your health care provider. This is important. Contact a health care provider if:  You are struggling to maintain a healthy diet or weight.  You need help to start on an exercise program.  You need help to stop smoking. Get help right away if:  You have chest pain.  You have trouble breathing. This information is not intended to replace advice given to you by your health care provider. Make sure you discuss any questions you have with your health care provider. Document Released: 03/23/2005 Document Revised: 03/26/2017 Document Reviewed: 09/21/2015 Elsevier Patient Education  2020 [employer].

## 2018-11-15 LAB — COMPLETE METABOLIC PANEL WITH GFR
AG Ratio: 1.8 (calc) (ref 1.0–2.5)
ALT: 12 U/L (ref 6–29)
AST: 18 U/L (ref 10–35)
Albumin: 4.2 g/dL (ref 3.6–5.1)
Alkaline phosphatase (APISO): 65 U/L (ref 37–153)
BUN/Creatinine Ratio: 14 (calc) (ref 6–22)
BUN: 14 mg/dL (ref 7–25)
CO2: 29 mmol/L (ref 20–32)
Calcium: 9.5 mg/dL (ref 8.6–10.4)
Chloride: 106 mmol/L (ref 98–110)
Creat: 0.98 mg/dL — ABNORMAL HIGH (ref 0.60–0.93)
GFR, Est African American: 65 mL/min/{1.73_m2} (ref >=60)
GFR, Est Non African American: 56 mL/min/{1.73_m2} — ABNORMAL LOW (ref >=60)
Globulin: 2.3 g/dL (calc) (ref 1.9–3.7)
Glucose, Bld: 81 mg/dL (ref 65–99)
Potassium: 5.1 mmol/L (ref 3.5–5.3)
Sodium: 145 mmol/L (ref 135–146)
Total Bilirubin: 0.5 mg/dL (ref 0.2–1.2)
Total Protein: 6.5 g/dL (ref 6.1–8.1)

## 2018-11-15 LAB — CBC WITH DIFFERENTIAL/PLATELET
Absolute Monocytes: 359 cells/uL (ref 200–950)
Basophils Absolute: 17 cells/uL (ref 0–200)
Basophils Relative: 0.3 %
Eosinophils Absolute: 97 cells/uL (ref 15–500)
Eosinophils Relative: 1.7 %
HCT: 39.3 % (ref 35.0–45.0)
Hemoglobin: 13.2 g/dL (ref 11.7–15.5)
Lymphs Abs: 1112 cells/uL (ref 850–3900)
MCH: 32.8 pg (ref 27.0–33.0)
MCHC: 33.6 g/dL (ref 32.0–36.0)
MCV: 97.5 fL (ref 80.0–100.0)
MPV: 11.4 fL (ref 7.5–12.5)
Monocytes Relative: 6.3 %
Neutro Abs: 4115 cells/uL (ref 1500–7800)
Neutrophils Relative %: 72.2 %
Platelets: 180 10*3/uL (ref 140–400)
RBC: 4.03 10*6/uL (ref 3.80–5.10)
RDW: 12.3 % (ref 11.0–15.0)
Total Lymphocyte: 19.5 %
WBC: 5.7 10*3/uL (ref 3.8–10.8)

## 2018-11-15 LAB — LIPID PANEL
Cholesterol: 232 mg/dL — ABNORMAL HIGH (ref <200)
HDL: 51 mg/dL (ref >=50)
LDL Cholesterol (Calc): 152 mg/dL (calc) — ABNORMAL HIGH
Non-HDL Cholesterol (Calc): 181 mg/dL (calc) — ABNORMAL HIGH (ref <130)
Total CHOL/HDL Ratio: 4.5 (calc) (ref <5.0)
Triglycerides: 155 mg/dL — ABNORMAL HIGH (ref <150)

## 2018-11-15 LAB — TSH: TSH: 1.21 mIU/L (ref 0.40–4.50)

## 2018-11-15 LAB — MAGNESIUM: Magnesium: 1.9 mg/dL (ref 1.5–2.5)

## 2018-11-15 LAB — HEMOGLOBIN A1C
Hgb A1c MFr Bld: 5.5 % of total Hgb (ref <5.7)
Mean Plasma Glucose: 111 (calc)
eAG (mmol/L): 6.2 (calc)

## 2018-11-15 LAB — VITAMIN D 25 HYDROXY (VIT D DEFICIENCY, FRACTURES): Vit D, 25-Hydroxy: 86 ng/mL (ref 30–100)

## 2018-12-01 DIAGNOSIS — I6789 Other cerebrovascular disease: Secondary | ICD-10-CM | POA: Diagnosis not present

## 2018-12-01 DIAGNOSIS — J449 Chronic obstructive pulmonary disease, unspecified: Secondary | ICD-10-CM | POA: Diagnosis not present

## 2018-12-01 DIAGNOSIS — R5381 Other malaise: Secondary | ICD-10-CM | POA: Diagnosis not present

## 2018-12-01 DIAGNOSIS — J9690 Respiratory failure, unspecified, unspecified whether with hypoxia or hypercapnia: Secondary | ICD-10-CM | POA: Diagnosis not present

## 2018-12-22 ENCOUNTER — Ambulatory Visit (INDEPENDENT_AMBULATORY_CARE_PROVIDER_SITE_OTHER): Payer: Medicare HMO | Admitting: *Deleted

## 2018-12-22 ENCOUNTER — Other Ambulatory Visit: Payer: Self-pay

## 2018-12-22 VITALS — Temp 97.5°F

## 2018-12-22 DIAGNOSIS — Z23 Encounter for immunization: Secondary | ICD-10-CM

## 2018-12-22 NOTE — Progress Notes (Signed)
Patient here for a high dose flu vaccine. Tolerated well.

## 2019-01-01 DIAGNOSIS — J9690 Respiratory failure, unspecified, unspecified whether with hypoxia or hypercapnia: Secondary | ICD-10-CM | POA: Diagnosis not present

## 2019-01-01 DIAGNOSIS — R5381 Other malaise: Secondary | ICD-10-CM | POA: Diagnosis not present

## 2019-01-01 DIAGNOSIS — I6789 Other cerebrovascular disease: Secondary | ICD-10-CM | POA: Diagnosis not present

## 2019-01-01 DIAGNOSIS — J449 Chronic obstructive pulmonary disease, unspecified: Secondary | ICD-10-CM | POA: Diagnosis not present

## 2019-01-07 IMAGING — PT NM PET TUM IMG RESTAG (PS) SKULL BASE T - THIGH
1 of 7 series · 1 of 25 positions shown · non-contrast
Comparison: PET-CT 02/24/2016

CLINICAL DATA: Subsequent treatment strategy for follicular
lymphoma. Patient status post R-CHOP chemotherapy..

EXAM:
NUCLEAR MEDICINE PET SKULL BASE TO THIGH
TECHNIQUE: 5.4 mCi F-18 FDG was injected intravenously. Full-ring PET imaging
was performed from the skull base to thigh after the radiotracer. CT
data was obtained and used for attenuation correction and anatomic
localization.
FASTING BLOOD GLUCOSE:  Value: 95 mg/dl

[Series 4: ct sk_thigh 5.0 b31f · axial · 5.0mm · 0.98mm/px · 1 of 202 slices shown]
[im 202/202  brain]
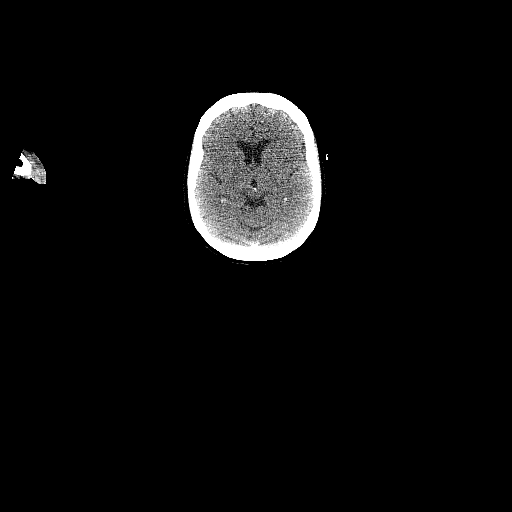

[1 of 25 positions shown; findings below may reference images not displayed]

FINDINGS: NECK

Resolution of the hypermetabolic activity in the RIGHT submandibular
gland. Resolution of hypermetabolic activity in the RIGHT level II
lymph nodes. No hypermetabolic lymph nodes remain in neck.

Incidental note of asymmetric activity within the vocal cords,RIGHT
greater than LEFT. No clear etiology.

CHEST

No hypermetabolic mediastinal or hilar nodes. Severe emphysematous
change the upper lobes. Several small sub 5 mm subpleural nodules
are unchanged. Larger nodule LEFT lower lobe measuring 6 mm (image
58 series 2) is unchanged.

Mild diffuse activity through the esophagus suggests esophagitis.

ABDOMEN/PELVIS

No abnormal hypermetabolic activity within the liver, pancreas,
adrenal glands, or spleen. No hypermetabolic lymph nodes in the
abdomen or pelvis.

Gallstones noted. Atherosclerotic calcification aorta noted. Uterus
and ovaries normal.

Mild diffuse activity in the esophagus suggests mild esophagitis.

SKELETON

No focal hypermetabolic activity to suggest skeletal metastasis.
IMPRESSION: 1. No metabolic activity associated with the RIGHT submandibular
gland or RIGHT cervical lymph nodes consistent with a complete
response.
2. Bilateral pulmonary nodules are not changed from comparison exam.
3. Diffuse mild esophagus metabolic activity suggests mild
esophagitis.

## 2019-01-31 DIAGNOSIS — J449 Chronic obstructive pulmonary disease, unspecified: Secondary | ICD-10-CM | POA: Diagnosis not present

## 2019-01-31 DIAGNOSIS — I6789 Other cerebrovascular disease: Secondary | ICD-10-CM | POA: Diagnosis not present

## 2019-01-31 DIAGNOSIS — J9690 Respiratory failure, unspecified, unspecified whether with hypoxia or hypercapnia: Secondary | ICD-10-CM | POA: Diagnosis not present

## 2019-01-31 DIAGNOSIS — R5381 Other malaise: Secondary | ICD-10-CM | POA: Diagnosis not present

## 2019-02-15 ENCOUNTER — Ambulatory Visit (INDEPENDENT_AMBULATORY_CARE_PROVIDER_SITE_OTHER): Payer: Medicare HMO | Admitting: Internal Medicine

## 2019-02-15 ENCOUNTER — Encounter: Payer: Self-pay | Admitting: Internal Medicine

## 2019-02-15 ENCOUNTER — Other Ambulatory Visit: Payer: Self-pay

## 2019-02-15 VITALS — BP 134/68 | HR 72 | Temp 97.7°F | Resp 16 | Ht 62.0 in | Wt 109.2 lb

## 2019-02-15 DIAGNOSIS — C8231 Follicular lymphoma grade IIIa, lymph nodes of head, face, and neck: Secondary | ICD-10-CM

## 2019-02-15 DIAGNOSIS — Z79899 Other long term (current) drug therapy: Secondary | ICD-10-CM | POA: Diagnosis not present

## 2019-02-15 DIAGNOSIS — G4733 Obstructive sleep apnea (adult) (pediatric): Secondary | ICD-10-CM | POA: Diagnosis not present

## 2019-02-15 DIAGNOSIS — E559 Vitamin D deficiency, unspecified: Secondary | ICD-10-CM

## 2019-02-15 DIAGNOSIS — E782 Mixed hyperlipidemia: Secondary | ICD-10-CM | POA: Diagnosis not present

## 2019-02-15 DIAGNOSIS — I10 Essential (primary) hypertension: Secondary | ICD-10-CM

## 2019-02-15 DIAGNOSIS — R7309 Other abnormal glucose: Secondary | ICD-10-CM | POA: Diagnosis not present

## 2019-02-15 DIAGNOSIS — J449 Chronic obstructive pulmonary disease, unspecified: Secondary | ICD-10-CM

## 2019-02-15 DIAGNOSIS — J439 Emphysema, unspecified: Secondary | ICD-10-CM | POA: Diagnosis not present

## 2019-02-15 NOTE — Patient Instructions (Signed)

## 2019-02-15 NOTE — Progress Notes (Signed)
History of Present Illness:      This very nice 78 y.o. WWF presents for 6 month follow up with HTN, HLD, Pre-Diabetes and Vitamin D Deficiency.  Patient also has COPD & OSA/Cpap overlap. She has GERD currently controlled on diet.       Patient is followed by Dr Alvy Bimler since 2017 for indolent Grade IIIA Follicular Lymphoma treated initially with CHOP, then Rituximab and in remission since.      Patient is treated for HTN (1985) & BP has been controlled at home. Today's BP is at goal - 134/68. Patient has had no complaints of any cardiac type chest pain, palpitations, dyspnea / orthopnea / PND, dizziness, claudication, or dependent edema.      Hyperlipidemia is not controlled with diet since she self-d/c'd her Rosuvastatin. Patient denies myalgias or other med SE's. Last Lipids were not at goal:  Lab Results  Component Value Date   CHOL 232 (H) 11/14/2018   HDL 51 11/14/2018   LDLCALC 152 (H) 11/14/2018   TRIG 155 (H) 11/14/2018   CHOLHDL 4.5 11/14/2018        Also, the patient has history of PreDiabetes and has had no symptoms of reactive hypoglycemia, diabetic polys, paresthesias or visual blurring.  Last A1c was Normal & at goal: Lab Results  Component Value Date   HGBA1C 5.5 11/14/2018           Further, the patient also has history of Vitamin D Deficiency ("44" / 2008)   and supplements vitamin D without any suspected side-effects. Last vitamin D was at goal:  Lab Results  Component Value Date   VD25OH 86 11/14/2018    Current Outpatient Medications on File Prior to Visit  Medication Sig  . aspirin EC 81 MG tablet Take 81 mg by mouth daily.  . Coenzyme Q10 10 MG capsule Take 10 mg by mouth 2 (two) times daily.  Marland Kitchen gabapentin (NEURONTIN) 300 MG capsule Take 1 capsule 3 to 4 x /day as needed for Pain  . Magnesium 250 MG TABS Take 250 mg by mouth daily.   . potassium chloride (K-DUR,KLOR-CON) 10 MEQ tablet Take 10-20 mEq by mouth See admin instructions. 34mEq in the AM  and in the PM  . rosuvastatin (CRESTOR) 5 MG tablet Take 1 tablet (5 mg total) by mouth at bedtime.  . Tiotropium Bromide-Olodaterol (STIOLTO RESPIMAT) 2.5-2.5 MCG/ACT AERS Inhale 2 puffs into the lungs daily.  . vitamin C (ASCORBIC ACID) 500 MG tablet Take 500 mg by mouth daily.  . [DISCONTINUED] prochlorperazine (COMPAZINE) 10 MG tablet Take 1 tablet (10 mg total) by mouth every 6 (six) hours as needed (Nausea or vomiting).   No current facility-administered medications on file prior to visit.     No Active Allergies  PMHx:   Past Medical History:  Diagnosis Date  . Anxiety   . COPD (chronic obstructive pulmonary disease) ([place])   . Diverticulosis   . Diverticulosis of colon with hemorrhage   . GERD (gastroesophageal reflux disease)   . H/O total knee replacement, left 11/05/2017  . Hx of adenomatous colonic polyps   . Hyperlipidemia   . Hypertension   . Hypomagnesemia   . Neuropathy   . Peripheral neuropathy   . Prediabetes   . RLS (restless legs syndrome)   . Sleep apnea    CPAP  . Vitamin B12 deficiency   . Vitamin D deficiency    Immunization History  Administered Date(s) Administered  . Influenza  Split 01/18/2013  . Influenza, High Dose Seasonal PF 12/05/2014, 01/23/2016, 12/22/2016, 01/11/2018, 12/22/2018  . Influenza-Unspecified 01/17/2018  . Pneumococcal Conjugate-13 11/27/2013  . Pneumococcal Polysaccharide-23 11/19/2011  . Td 08/13/2008  . Zoster 04/06/2010   Past Surgical History:  Procedure Laterality Date  . APPENDECTOMY    . CATARACT EXTRACTION, BILATERAL  2017   Bilateral cataract Dr. Bing Plume  . COLONOSCOPY N/A 05/18/2014   Procedure: COLONOSCOPY;  Surgeon: Lafayette Dragon, MD;  Location: [facility];  Service: Endoscopy;  Laterality: N/A;  . DIRECT LARYNGOSCOPY Right 02/04/2016   Procedure: DIRECT LARYNGOSCOPY;  Surgeon: Rozetta Nunnery, MD;  Location: [facility];  Service: ENT;  Laterality: Right;  . LYMPH NODE BIOPSY Right  02/04/2016   Procedure: EXCISIONAL BIOPSY RIGHT NECK LYMPH NODE;  Surgeon: Rozetta Nunnery, MD;  Location: [place];  Service: ENT;  Laterality: Right;  . TONSILLECTOMY    . TOTAL HIP ARTHROPLASTY Left 10/21/2017   Procedure: TOTAL HIP ARTHROPLASTY ANTERIOR APPROACH;  Surgeon: Leandrew Koyanagi, MD;  Location: [place];  Service: Orthopedics;  Laterality: Left;    FHx:    Reviewed / unchanged  SHx:    Reviewed / unchanged   Systems Review:  Constitutional: Denies fever, chills, wt changes, headaches, insomnia, fatigue, night sweats, change in appetite. Eyes: Denies redness, blurred vision, diplopia, discharge, itchy, watery eyes.  ENT: Denies discharge, congestion, post nasal drip, epistaxis, sore throat, earache, hearing loss, dental pain, tinnitus, vertigo, sinus pain, snoring.  CV: Denies chest pain, palpitations, irregular heartbeat, syncope, dyspnea, diaphoresis, orthopnea, PND, claudication or edema. Respiratory: denies cough, dyspnea, DOE, pleurisy, hoarseness, laryngitis, wheezing.  Gastrointestinal: Denies dysphagia, odynophagia, heartburn, reflux, water brash, abdominal pain or cramps, nausea, vomiting, bloating, diarrhea, constipation, hematemesis, melena, hematochezia  or hemorrhoids. Genitourinary: Denies dysuria, frequency, urgency, nocturia, hesitancy, discharge, hematuria or flank pain. Musculoskeletal: Denies arthralgias, myalgias, stiffness, jt. swelling, pain, limping or strain/sprain.  Skin: Denies pruritus, rash, hives, warts, acne, eczema or change in skin lesion(s). Neuro: No weakness, tremor, incoordination, spasms, paresthesia or pain. Psychiatric: Denies confusion, memory loss or sensory loss. Endo: Denies change in weight, skin or hair change.  Heme/Lymph: No excessive bleeding, bruising or enlarged lymph nodes.  Physical Exam  BP 134/68   Pulse 72   Temp 97.7 F (36.5 C)   Resp 16   Ht 5\' 2"  (1.575 m)   Wt 109 lb 3.2 oz (49.5 kg)   BMI  19.97 kg/m   Appears  well nourished, well groomed  and in no distress.  Eyes: PERRLA, EOMs, conjunctiva no swelling or erythema. Sinuses: No frontal/maxillary tenderness ENT/Mouth: EAC's clear, TM's nl w/o erythema, bulging. Nares clear w/o erythema, swelling, exudates. Oropharynx clear without erythema or exudates. Oral hygiene is good. Tongue normal, non obstructing. Hearing intact.  Neck: Supple. Thyroid not palpable. Car 2+/2+ without bruits, nodes or JVD. Chest: Respirations nl with BS clear & equal w/o rales, rhonchi, wheezing or stridor.  Cor: Heart sounds normal w/ regular rate and rhythm without sig. murmurs, gallops, clicks or rubs. Peripheral pulses normal and equal  without edema.  Abdomen: Soft & bowel sounds normal. Non-tender w/o guarding, rebound, hernias, masses or organomegaly.  Lymphatics: Unremarkable.  Musculoskeletal: Full ROM all peripheral extremities, joint stability, 5/5 strength and normal gait.  Skin: Warm, dry without exposed rashes, lesions or ecchymosis apparent.  Neuro: Cranial nerves intact, reflexes equal bilaterally. Sensory-motor testing grossly intact. Tendon reflexes grossly intact.  Pysch: Alert & oriented x 3.  Insight and judgement nl &  appropriate. No ideations.  Assessment and Plan:  1. Essential hypertension  - Continue medication, monitor blood pressure at home.  - Continue DASH diet.  Reminder to go to the ER if any CP,  SOB, nausea, dizziness, severe HA, changes vision/speech.  - CBC with Diff - COMPLETE METABOLIC PANEL WITH GFR - Magnesium - TSH  2. Hyperlipidemia, mixed   - Continue diet/meds, exercise,& lifestyle modifications.  - Continue monitor periodic cholesterol/liver & renal functions   - Lipid Profile - TSH  3. Abnormal glucose  - Continue diet, exercise  - Lifestyle modifications.  - Monitor appropriate labs.  - Hemoglobin A1c ([facility]) - Insulin, random  4. Vitamin D deficiency  - Continue supplementation.   - Vitamin D (25 hydroxy)  5. Pulmonary emphysema, unspecified emphysema type ([place])   6. OSA and COPD overlap syndrome ([place])   7. Follicular lymphoma grade iiia, lymph nodes of head, face, and neck ([facility])  - CBC with Diff  8. Medication management  - CBC with Diff - COMPLETE METABOLIC PANEL WITH GFR - Magnesium - Lipid Profile - TSH - Hemoglobin A1c ([facility]) - Insulin, random - Vitamin D (25 hydroxy)        Discussed  regular exercise, BP monitoring, weight control to achieve/maintain BMI less than 25 and discussed med and SE's. Recommended labs to assess and monitor clinical status with further disposition pending results of labs.  I discussed the assessment and treatment plan with the patient. The patient was provided an opportunity to ask questions and all were answered. The patient agreed with the plan and demonstrated an understanding of the instructions.  I provided over 30 minutes of exam, counseling, chart review and  complex critical decision making.   Kirtland Bouchard, MD

## 2019-02-16 LAB — COMPLETE METABOLIC PANEL WITH GFR
AG Ratio: 2.2 (calc) (ref 1.0–2.5)
ALT: 11 U/L (ref 6–29)
AST: 17 U/L (ref 10–35)
Albumin: 4.3 g/dL (ref 3.6–5.1)
Alkaline phosphatase (APISO): 89 U/L (ref 37–153)
BUN/Creatinine Ratio: 13 (calc) (ref 6–22)
BUN: 13 mg/dL (ref 7–25)
CO2: 32 mmol/L (ref 20–32)
Calcium: 9.4 mg/dL (ref 8.6–10.4)
Chloride: 103 mmol/L (ref 98–110)
Creat: 0.97 mg/dL — ABNORMAL HIGH (ref 0.60–0.93)
GFR, Est African American: 66 mL/min/{1.73_m2} (ref >=60)
GFR, Est Non African American: 57 mL/min/{1.73_m2} — ABNORMAL LOW (ref >=60)
Globulin: 2 g/dL (calc) (ref 1.9–3.7)
Glucose, Bld: 85 mg/dL (ref 65–99)
Potassium: 5.1 mmol/L (ref 3.5–5.3)
Sodium: 143 mmol/L (ref 135–146)
Total Bilirubin: 0.3 mg/dL (ref 0.2–1.2)
Total Protein: 6.3 g/dL (ref 6.1–8.1)

## 2019-02-16 LAB — TSH: TSH: 1.19 mIU/L (ref 0.40–4.50)

## 2019-02-16 LAB — CBC WITH DIFFERENTIAL/PLATELET
Absolute Monocytes: 462 cells/uL (ref 200–950)
Basophils Absolute: 20 cells/uL (ref 0–200)
Basophils Relative: 0.3 %
Eosinophils Absolute: 78 cells/uL (ref 15–500)
Eosinophils Relative: 1.2 %
HCT: 39.8 % (ref 35.0–45.0)
Hemoglobin: 13.4 g/dL (ref 11.7–15.5)
Lymphs Abs: 1300 cells/uL (ref 850–3900)
MCH: 33.1 pg — ABNORMAL HIGH (ref 27.0–33.0)
MCHC: 33.7 g/dL (ref 32.0–36.0)
MCV: 98.3 fL (ref 80.0–100.0)
MPV: 11.4 fL (ref 7.5–12.5)
Monocytes Relative: 7.1 %
Neutro Abs: 4641 cells/uL (ref 1500–7800)
Neutrophils Relative %: 71.4 %
Platelets: 173 10*3/uL (ref 140–400)
RBC: 4.05 10*6/uL (ref 3.80–5.10)
RDW: 12.9 % (ref 11.0–15.0)
Total Lymphocyte: 20 %
WBC: 6.5 10*3/uL (ref 3.8–10.8)

## 2019-02-16 LAB — VITAMIN D 25 HYDROXY (VIT D DEFICIENCY, FRACTURES): Vit D, 25-Hydroxy: 61 ng/mL (ref 30–100)

## 2019-02-16 LAB — LIPID PANEL
Cholesterol: 218 mg/dL — ABNORMAL HIGH (ref <200)
HDL: 62 mg/dL (ref >=50)
LDL Cholesterol (Calc): 127 mg/dL (calc) — ABNORMAL HIGH
Non-HDL Cholesterol (Calc): 156 mg/dL (calc) — ABNORMAL HIGH (ref <130)
Total CHOL/HDL Ratio: 3.5 (calc) (ref <5.0)
Triglycerides: 170 mg/dL — ABNORMAL HIGH (ref <150)

## 2019-02-16 LAB — HEMOGLOBIN A1C
Hgb A1c MFr Bld: 5.3 % of total Hgb (ref <5.7)
Mean Plasma Glucose: 105 (calc)
eAG (mmol/L): 5.8 (calc)

## 2019-02-16 LAB — INSULIN, RANDOM: Insulin: 23.5 u[IU]/mL — ABNORMAL HIGH

## 2019-02-16 LAB — MAGNESIUM: Magnesium: 1.9 mg/dL (ref 1.5–2.5)

## 2019-02-19 ENCOUNTER — Encounter: Payer: Self-pay | Admitting: Internal Medicine

## 2019-03-03 DIAGNOSIS — J9690 Respiratory failure, unspecified, unspecified whether with hypoxia or hypercapnia: Secondary | ICD-10-CM | POA: Diagnosis not present

## 2019-03-03 DIAGNOSIS — J449 Chronic obstructive pulmonary disease, unspecified: Secondary | ICD-10-CM | POA: Diagnosis not present

## 2019-03-03 DIAGNOSIS — R5381 Other malaise: Secondary | ICD-10-CM | POA: Diagnosis not present

## 2019-03-03 DIAGNOSIS — I6789 Other cerebrovascular disease: Secondary | ICD-10-CM | POA: Diagnosis not present

## 2019-04-02 DIAGNOSIS — J449 Chronic obstructive pulmonary disease, unspecified: Secondary | ICD-10-CM | POA: Diagnosis not present

## 2019-04-02 DIAGNOSIS — R5381 Other malaise: Secondary | ICD-10-CM | POA: Diagnosis not present

## 2019-04-02 DIAGNOSIS — J9690 Respiratory failure, unspecified, unspecified whether with hypoxia or hypercapnia: Secondary | ICD-10-CM | POA: Diagnosis not present

## 2019-04-02 DIAGNOSIS — I6789 Other cerebrovascular disease: Secondary | ICD-10-CM | POA: Diagnosis not present

## 2019-05-03 ENCOUNTER — Encounter: Payer: Self-pay | Admitting: Physician Assistant

## 2019-05-03 DIAGNOSIS — I6789 Other cerebrovascular disease: Secondary | ICD-10-CM | POA: Diagnosis not present

## 2019-05-03 DIAGNOSIS — J449 Chronic obstructive pulmonary disease, unspecified: Secondary | ICD-10-CM | POA: Diagnosis not present

## 2019-05-03 DIAGNOSIS — R5381 Other malaise: Secondary | ICD-10-CM | POA: Diagnosis not present

## 2019-05-03 DIAGNOSIS — J9690 Respiratory failure, unspecified, unspecified whether with hypoxia or hypercapnia: Secondary | ICD-10-CM | POA: Diagnosis not present

## 2019-05-04 ENCOUNTER — Ambulatory Visit: Payer: Self-pay | Admitting: Physician Assistant

## 2019-05-08 ENCOUNTER — Other Ambulatory Visit: Payer: Self-pay | Admitting: Hematology and Oncology

## 2019-05-08 ENCOUNTER — Inpatient Hospital Stay: Payer: Medicare HMO | Attending: Hematology and Oncology | Admitting: Hematology and Oncology

## 2019-05-08 ENCOUNTER — Other Ambulatory Visit: Payer: Self-pay

## 2019-05-08 ENCOUNTER — Encounter: Payer: Self-pay | Admitting: Hematology and Oncology

## 2019-05-08 DIAGNOSIS — Z7982 Long term (current) use of aspirin: Secondary | ICD-10-CM | POA: Diagnosis not present

## 2019-05-08 DIAGNOSIS — Z79899 Other long term (current) drug therapy: Secondary | ICD-10-CM | POA: Insufficient documentation

## 2019-05-08 DIAGNOSIS — C8231 Follicular lymphoma grade IIIa, lymph nodes of head, face, and neck: Secondary | ICD-10-CM

## 2019-05-08 DIAGNOSIS — Z9221 Personal history of antineoplastic chemotherapy: Secondary | ICD-10-CM | POA: Insufficient documentation

## 2019-05-08 DIAGNOSIS — Z8572 Personal history of non-Hodgkin lymphomas: Secondary | ICD-10-CM | POA: Diagnosis not present

## 2019-05-08 NOTE — Assessment & Plan Note (Signed)
Clinically, she has no signs of lymphoma Her recent blood count monitoring through her PCP office in November was within normal limits Clinical exam is benign I plan to see her again in 12 months for further follow-up The patient is educated to watch for signs and symptoms of recurrence

## 2019-05-08 NOTE — Progress Notes (Signed)
[facility] OFF[facility] PROGRESS NOTE  Patient Care Team: Unk Pinto, MD as PCP - General (Internal Medicine) Lafayette Dragon, MD (Inactive) as Consulting Physician (Gastroenterology) Loletha Carrow, MD (Pulmonary Disease) Heath Lark, MD as Consulting Physician (Hematology and Oncology) Rozetta Nunnery, MD as Consulting Physician (Otolaryngology) Chales Salmon, OD as Consulting Physician (Optometry)  ASSESSMENT & PLAN:  Follicular lymphoma grade iiia, lymph nodes of head, face, and neck ([place]) Clinically, she has no signs of lymphoma Her recent blood count monitoring through her PCP office in November was within normal limits Clinical exam is benign I plan to see her again in 12 months for further follow-up The patient is educated to watch for signs and symptoms of recurrence   No orders of the defined types were placed in this encounter.   All questions were answered. The patient knows to call the clinic with any problems, questions or concerns. The total time spent in the appointment was 15 minutes encounter with patients including review of chart and various tests results, discussions about plan of care and coordination of care plan   Heath Lark, MD 05/08/2019 11:38 AM  INTERVAL HISTORY: Please see below for problem oriented charting. She returns for further follow-up She feels well No recent infection, fever or chills No changes in appetite or abnormal weight loss No new lymphadenopathy  SUMMARY OF ONCOLOGIC HISTORY: Oncology History  Follicular lymphoma grade iiia, lymph nodes of head, face, and neck ([place])  01/29/2016 Imaging   Ct neck showed right submandibular lymphadenopathy and asymmetric prominence of jugular chain nodes. Metastatic disease is the primary diagnostic concern and submandibular node sampling is recommended. No primary lesion is seen, limited at the oral cavity due to dental artifact. 2. Emphysema   02/04/2016 Pathology Results    Accession: [id number] Biopsy showed high grade follicular lymphoma   [id number] PET scan   Hypermetabolic right submandibular and right level II/III lymph nodes, consistent with the given history of lymphoma. No hypermetabolic lymph nodes in the chest, abdomen or pelvis. Aortic atherosclerosis (ICD10-170.0). Coronary artery calcification. Emphysema (ICD10-J43.9).   02/24/2016 Imaging   ECHO showed LV The cavity size was normal. Systolic function was   normal. The estimated ejection fraction was in the range of 55% to 60%. Wall motion was normal; there were no regional wall motion abnormalities. Doppler parameters are consistent with abnormal left ventricular relaxation (grade 1 diastolic dysfunction).   03/13/2016 - 04/24/2016 Chemotherapy   The patient had R-CHOP chemo x 3 cycles   05/27/2016 PET scan   No metabolic activity associated with the RIGHT submandibular gland or RIGHT cervical lymph nodes consistent with a complete response. 2. Bilateral pulmonary nodules are not changed from comparison exam. 3. Diffuse mild esophagus metabolic activity suggests mild esophagitis.   06/12/2016 - 04/04/2018 Chemotherapy   The patient had consolidation treatment with maintenance Rituximab   05/05/2018 Imaging   1. No findings of active lymphoma in the chest, abdomen, or pelvis. No splenomegaly or focal splenic lesion. 2. Other imaging findings of potential clinical significance: Aortic Atherosclerosis (ICD10-I70.0) and Emphysema (ICD10-J43.9). Coronary atherosclerosis. Small type 1 hiatal hernia. Chronic small basilar pulmonary nodules, long-term chronicity indicating benign etiology. Stable appearance of T12 anterior wedge compression. Cholelithiasis. Descending and sigmoid colon diverticulosis. Prominent stool throughout the colon favors constipation.     REVIEW OF SYSTEMS:   Constitutional: Denies fevers, chills or abnormal weight loss Eyes: Denies blurriness of vision Ears, nose, mouth, throat,  and face: Denies mucositis or sore throat Respiratory: Denies cough,  dyspnea or wheezes Cardiovascular: Denies palpitation, chest discomfort or lower extremity swelling Gastrointestinal:  Denies nausea, heartburn or change in bowel habits Skin: Denies abnormal skin rashes Lymphatics: Denies new lymphadenopathy or easy bruising Neurological:Denies numbness, tingling or new weaknesses Behavioral/Psych: Mood is stable, no new changes  All other systems were reviewed with the patient and are negative.  I have reviewed the past medical history, past surgical history, social history and family history with the patient and they are unchanged from previous note.  ALLERGIES:  has No Known Allergies.  MEDICATIONS:  Current Outpatient Medications  Medication Sig Dispense Refill  . aspirin EC 81 MG tablet Take 81 mg by mouth daily.    . Coenzyme Q10 10 MG capsule Take 10 mg by mouth 2 (two) times daily.    Marland Kitchen gabapentin (NEURONTIN) 300 MG capsule Take 1 capsule 3 to 4 x /day as needed for Pain 360 capsule 1  . Magnesium 250 MG TABS Take 250 mg by mouth daily.     . potassium chloride (K-DUR,KLOR-CON) 10 MEQ tablet Take 10-20 mEq by mouth See admin instructions. 51mEq in the AM and in the PM    . rosuvastatin (CRESTOR) 5 MG tablet Take 1 tablet (5 mg total) by mouth at bedtime. 90 tablet 1  . Tiotropium Bromide-Olodaterol (STIOLTO RESPIMAT) 2.5-2.5 MCG/ACT AERS Inhale 2 puffs into the lungs daily. 12 g 3  . vitamin C (ASCORBIC ACID) 500 MG tablet Take 500 mg by mouth daily.     No current facility-administered medications for this visit.    PHYSICAL EXAMINATION: ECOG PERFORMANCE STATUS: 0 - Asymptomatic  Vitals:   05/08/19 0938  BP: (!) 146/68  Pulse: 82  Resp: 18  Temp: 97.8 F (36.6 C)  SpO2: 96%   Filed Weights   05/08/19 0938  Weight: 101 lb 12.8 oz (46.2 kg)    GENERAL:alert, no distress and comfortable SKIN: skin color, texture, turgor are normal, no rashes or significant  lesions EYES: normal, Conjunctiva are pink and non-injected, sclera clear OROPHARYNX:no exudate, no erythema and lips, buccal mucosa, and tongue normal  NECK: supple, thyroid normal size, non-tender, without nodularity LYMPH:  no palpable lymphadenopathy in the cervical, axillary or inguinal LUNGS: clear to auscultation and percussion with normal breathing effort HEART: regular rate & rhythm and no murmurs and no lower extremity edema ABDOMEN:abdomen soft, non-tender and normal bowel sounds Musculoskeletal:no cyanosis of digits and no clubbing  NEURO: alert & oriented x 3 with fluent speech, no focal motor/sensory deficits  LABORATORY DATA:  I have reviewed the data as listed    Component Value Date/Time   NA 143 02/15/2019 1415   NA 142 02/12/2017 0809   K 5.1 02/15/2019 1415   K 4.0 02/12/2017 0809   CL 103 02/15/2019 1415   CO2 32 02/15/2019 1415   CO2 28 02/12/2017 0809   GLUCOSE 85 02/15/2019 1415   GLUCOSE 97 02/12/2017 0809   BUN 13 02/15/2019 1415   BUN 11.2 02/12/2017 0809   CREATININE 0.97 (H) 02/15/2019 1415   CREATININE 0.9 02/12/2017 0809   CALCIUM 9.4 02/15/2019 1415   CALCIUM 9.3 02/12/2017 0809   PROT 6.3 02/15/2019 1415   PROT 6.7 02/12/2017 0809   ALBUMIN 3.8 05/05/2018 1033   ALBUMIN 3.8 02/12/2017 0809   AST 17 02/15/2019 1415   AST 16 02/12/2017 0809   ALT 11 02/15/2019 1415   ALT 15 02/12/2017 0809   ALKPHOS 87 05/05/2018 1033   ALKPHOS 88 02/12/2017 0809   BILITOT 0.3  02/15/2019 1415   BILITOT 0.40 02/12/2017 0809   GFRNONAA 57 (L) 02/15/2019 1415   GFRAA 66 02/15/2019 1415    No results found for: SPEP, UPEP  Lab Results  Component Value Date   WBC 6.5 02/15/2019   NEUTROABS 4,641 02/15/2019   HGB 13.4 02/15/2019   HCT 39.8 02/15/2019   MCV 98.3 02/15/2019   PLT 173 02/15/2019      Chemistry      Component Value Date/Time   NA 143 02/15/2019 1415   NA 142 02/12/2017 0809   K 5.1 02/15/2019 1415   K 4.0 02/12/2017 0809   CL 103  02/15/2019 1415   CO2 32 02/15/2019 1415   CO2 28 02/12/2017 0809   BUN 13 02/15/2019 1415   BUN 11.2 02/12/2017 0809   CREATININE 0.97 (H) 02/15/2019 1415   CREATININE 0.9 02/12/2017 0809      Component Value Date/Time   CALCIUM 9.4 02/15/2019 1415   CALCIUM 9.3 02/12/2017 0809   ALKPHOS 87 05/05/2018 1033   ALKPHOS 88 02/12/2017 0809   AST 17 02/15/2019 1415   AST 16 02/12/2017 0809   ALT 11 02/15/2019 1415   ALT 15 02/12/2017 0809   BILITOT 0.3 02/15/2019 1415   BILITOT 0.40 02/12/2017 0809

## 2019-05-09 ENCOUNTER — Telehealth: Payer: Self-pay | Admitting: Hematology and Oncology

## 2019-05-09 NOTE — Telephone Encounter (Signed)
I talk with patient regarding 05/07/2020 

## 2019-05-22 NOTE — Progress Notes (Signed)
Medicare wellness visit and follow up.   Assessment:   Anxiety -     citalopram (CELEXA) 10 MG tablet; Take 1 tablet (10 mg total) by mouth daily. -     ALPRAZolam (XANAX) 0.25 MG tablet; Take 1 tablet (0.25 mg total) by mouth 2 (two) times daily as needed for anxiety. - ? Memory issues recently- will check labs, recent normal MRI brain 2019, normal neuro - ? From anxiety/depression- will treat first but discussed aricept/namenda and lifestyle stratigies - will follow up 6 weeks and get mini mental status at that visit.   Memory deficit -     citalopram (CELEXA) 10 MG tablet; Take 1 tablet (10 mg total) by mouth daily. - ? Memory issues recently- will check labs, recent normal MRI brain 2019, normal neuro - ? From anxiety/depression- will treat first but discussed aricept/namenda and lifestyle stratigies - will follow up 6 weeks and get mini mental status at that visit.   Encounter for annual medicare wellness visit 1 year  Atherosclerosis of aorta Control blood pressure, cholesterol, glucose, increase exercise.   Essential hypertension - continue medications, DASH diet, exercise and monitor at home. Call if greater than 130/80.  - CBC with Differential/Platelet - CMP/GFR - TSH  Pulmonary emphysema, unspecified emphysema type Follow up pulmonary, continue inhaler.   Follicular lymphoma S/p treatment, followed by Dr. Alvy Bimler  Other abnormal glucose  Recent A1Cs at goal Discussed diet/exercise, weight management  Defer A1C; check CMP  CKD (chronic kidney disease) stage 3 Increase fluids, avoid NSAIDS, monitor sugars, will monitor - CMP WITH GFR  Hyperlipidemia -continue medications, check lipids, decrease fatty foods, increase activity.  - Lipid panel  Vitamin D deficiency - Vit D  25 hydroxy (rtn osteoporosis monitoring)   Medication management - Magnesium   Hypomagnesemia  Vitamin B12 deficiency Continue supplement  Gastroesophageal reflux disease with  esophagitis Continue PPI/H2 blocker, diet discussed   RLS (restless legs syndrome) Exercise   Hx of adenomatous colonic polyps Up to date  Anxiety remission  Mild malnutrition (HCC) Given ensure samples, will start doing one a day  OSA and COPD overlap syndrome (HCC) Sleep apnea- continue CPAP  Anemia, chronic disease -     Iron,Total/Total Iron Binding Cap -     Ferritin  Localized osteoporosis without current pathological fracture -     DG Bone Density; Future - pending result may refer for possible prolia start  Vitamin D deficiency -     VITAMIN D 25 Hydroxy (Vit-D Deficiency, Fractures)    Future Appointments  Date Time Provider [place]  07/04/2019  2:30 PM Vicie Mutters, PA-C [facility] None  08/31/2019  3:00 PM Unk Pinto, MD [facility] None  12/04/2019  2:00 PM Vicie Mutters, PA-C [facility] None  05/07/2020  8:30 AM [facility] LAB 1 [facility] None  05/07/2020  9:00 AM Heath Lark, MD [facility] None     Plan:   During the course of the visit the patient was educated and counseled about appropriate screening and preventive services including:    Pneumococcal vaccine   Influenza vaccine  Td vaccine  Screening electrocardiogram  Screening mammography  Bone densitometry screening  Colorectal cancer screening  Diabetes screening  Glaucoma screening  Nutrition counseling   .  Subjective:   Renee Hawkins is a 79 y.o. female who presents for Medicare Annual Wellness Visit and follow up.   Patient is s/p treatment follicular lymphoma, right neck, following oncology Dr. Alvy Bimler.   Dr. Felix Ahmadi, OD earlier this year had  abnormal left optic nerve, had negative CBC, ESR, CRP and negative MRI of brain and orbits.   She has COPD with OSA, on CPAP and respimet, trelegy was not as effective and cost more, wears CPAP nightly, 6-8 hours, states it is helping with breathing and energy, well controlled. Follows with Dr. Welford Roche PRN.     Had Dexa 04/2017, went from -3.5 to -3.2, on fosamax x 2016.   She has been feeling anxiety attacks. Her neighbor is with her. She states has started x 1st of the year. Started after she made some mistakes with her banking, and she got very upset after that. She was tearful, crying, shaking at the time but her neighbor was able to talk her down.  She will sleep well but she has gotten up in the middle of the night to check on her money and has forgotten how to get on her back site.   She is still driving, no anxiety with driving. Some slow recall with directions but will remember it.   She is on 2 of the gabapentin in the AM and 2 at night- she has not take any in a week.  She is not on her potassium supplement or magnesium.   BMI is Body mass index is 17.96 kg/m., she has been working on diet and exercise. Wt Readings from Last 3 Encounters:  05/23/19 98 lb 3.2 oz (44.5 kg)  05/08/19 101 lb 12.8 oz (46.2 kg)  02/15/19 109 lb 3.2 oz (49.5 kg)   Her blood pressure has been controlled at home, today their BP is BP: 122/64 She does workout, still at [employer]. Walking with a neighbor, lives byself.  She denies chest pain, shortness of breath, dizziness.   She is on cholesterol medication, she is not on a cholesterol medication. Her cholesterol is not at goal. The cholesterol last visit was:   Lab Results  Component Value Date   CHOL 164 05/23/2019   HDL 52 05/23/2019   LDLCALC 87 05/23/2019   TRIG 157 (H) 05/23/2019   CHOLHDL 3.2 05/23/2019   Lab Results  Component Value Date   GFRNONAA 66 05/23/2019   She has been working on diet and exercise for glucose management, and denies nausea, paresthesia of the feet and polydipsia. Last A1C in the office was:  Lab Results  Component Value Date   HGBA1C 5.3 05/23/2019   Patient is on Vitamin D supplement.   Lab Results  Component Value Date   VD25OH 97 05/23/2019     Lab Results  Component Value Date   GFRNONAA  66 05/23/2019      Medication Review   Current Outpatient Medications (Cardiovascular):  .  rosuvastatin (CRESTOR) 5 MG tablet, Take 1 tablet (5 mg total) by mouth at bedtime.  Current Outpatient Medications (Respiratory):  Marland Kitchen  Tiotropium Bromide-Olodaterol (STIOLTO RESPIMAT) 2.5-2.5 MCG/ACT AERS, Inhale 2 puffs into the lungs daily.  Current Outpatient Medications (Analgesics):  .  aspirin EC 81 MG tablet, Take 81 mg by mouth daily.   Current Outpatient Medications (Other):  Marland Kitchen  Coenzyme Q10 10 MG capsule, Take 10 mg by mouth 2 (two) times daily. Marland Kitchen  gabapentin (NEURONTIN) 300 MG capsule, Take 1 capsule 3 to 4 x /day as needed for Pain .  Magnesium 250 MG TABS, Take 250 mg by mouth daily.  .  potassium chloride (K-DUR,KLOR-CON) 10 MEQ tablet, Take 10-20 mEq by mouth See admin instructions. 43mq in the AM and in the PM .  vitamin  C (ASCORBIC ACID) 500 MG tablet, Take 500 mg by mouth daily. Marland Kitchen  ALPRAZolam (XANAX) 0.25 MG tablet, Take 1 tablet (0.25 mg total) by mouth 2 (two) times daily as needed for anxiety. .  citalopram (CELEXA) 10 MG tablet, Take 1 tablet (10 mg total) by mouth daily.  Current Problems (verified) Patient Active Problem List   Diagnosis Date Noted  . Mild malnutrition ([place]) 05/25/2019  . Left displaced femoral neck fracture ([place]) 01/18/2018  . Lumbar radiculopathy 01/18/2018  . Aortic atherosclerosis ([place]) 01/10/2018  . Anemia, chronic disease 12/13/2017  . Follicular lymphoma grade iiia, lymph nodes of head, face, and neck ([place]) 02/11/2016  . OSA and COPD overlap syndrome ([place]) 12/05/2014  . Osteoporosis 12/05/2014  . Medication management 03/06/2014  . CKD (chronic kidney disease) stage 3, GFR 30-59 ml/min (HCC) 11/27/2013  . COPD (chronic obstructive pulmonary disease) with emphysema ([place]) 11/27/2013  . Essential hypertension   . GERD    . Hx of adenomatous colonic polyps   . Hyperlipidemia, mixed   . Abnormal glucose   . Vitamin D deficiency   . RLS  (restless legs syndrome)   . Vitamin B12 deficiency   . Anxiety     Screening Tests Immunization History  Administered Date(s) Administered  . Influenza Split 01/18/2013  . Influenza, High Dose Seasonal PF 12/05/2014, 01/23/2016, 12/22/2016, 01/11/2018, 12/22/2018  . Influenza-Unspecified 01/17/2018  . Pneumococcal Conjugate-13 11/27/2013  . Pneumococcal Polysaccharide-23 11/19/2011  . Td 08/13/2008  . Zoster 04/06/2010   Preventative care: Last colonoscopy: 05/2014 Last mammogram: 04/2017 going to get 04/2019 Last pap smear/pelvic exam: Done per GYN DEXA: Started fosamax 2016, off 2020, 04/2017 T -3.2 will repeat this year CT chest 04/2018, stable pulmonary nodule CT neck 01/29/2016 Echo 02/24/2016  Prior vaccinations: Tdap: TODAY Influenza: 2020 Pneumonia: 2013 Prevnar: 2015 Shingles: 2012 COVID vaccine Phizer Feb 10, Feb 25th  Names of Other Physician/Practitioners you currently use: 1. [facility]- here for primary care 2. [place] eye doctor, 2019 3. dentist, last visit as needed 2017, needs to schedule  Patient Care Team: Unk Pinto, MD as PCP - General (Internal Medicine) Lafayette Dragon, MD (Inactive) as Consulting Physician (Gastroenterology) Loletha Carrow, MD (Pulmonary Disease) Heath Lark, MD as Consulting Physician (Hematology and Oncology) Rozetta Nunnery, MD as Consulting Physician (Otolaryngology) Chales Salmon, OD as Consulting Physician (Optometry)   Allergies No Known Allergies  SURGICAL HISTORY She  has a past surgical history that includes Appendectomy; Tonsillectomy; Colonoscopy (N/A, 05/18/2014); Lymph node biopsy (Right, 02/04/2016); Direct laryngoscopy (Right, 02/04/2016); Total hip arthroplasty (Left, 10/21/2017); and Cataract extraction, bilateral (2017). FAMILY HISTORY Her family history includes Stroke in her maternal grandmother and mother. SOCIAL HISTORY She  reports  that she quit smoking about 7 years ago. Her smoking use included cigarettes. She has a 30.00 pack-year smoking history. She has never used smokeless tobacco. She reports that she does not drink alcohol or use drugs.  MEDICARE WELLNESS OBJECTIVES: Physical activity: Current Exercise Habits: The patient does not participate in regular exercise at present Cardiac risk factors: Cardiac Risk Factors include: advanced age (>59mn, >>69women);dyslipidemia;hypertension;sedentary lifestyle Depression/mood screen:   [facility]/25/[facility]  Decreased Interest 1  Down, Depressed, Hopeless 0  PHQ - 2 Score 1    ADLs:  In your present state of health, do you have any difficulty performing the following activities: 05/25/2019 11/14/2018  Hearing? N N  Vision? N N  Difficulty concentrating or making decisions? YDarreld Mclean  N  Walking or climbing stairs? N N  Dressing or bathing? N N  Doing errands, shopping? N N  Comment she will drive short distances, has not gotten lost but will also have people drive her due to anxiety -  Some recent data might be hidden    Cognitive Testing  Alert? Yes  Normal Appearance?Yes  Oriented to person? Yes  Place? Yes   Time? Yes  Recall of three objects?  2/3  Can perform simple calculations? Yes  Displays appropriate judgment?Yes  Can read the correct time from a watch face?Yes  EOL planning: Does Patient Have a Medical Advance Directive?: Yes Does patient want to make changes to medical advance directive?: No - Patient declined  Review of Systems  Constitutional: Negative.   HENT: Negative.        + Snoring, better on CPAP  Respiratory: Positive for shortness of breath (improved with CPAP, not using O2 anymore). Negative for cough, hemoptysis, sputum production and wheezing.   Cardiovascular: Negative.   Gastrointestinal: Negative for abdominal pain, blood in stool, constipation, diarrhea, heartburn, melena, nausea and vomiting.  Genitourinary: Negative for  dysuria, flank pain, frequency, hematuria and urgency.  Musculoskeletal: Negative.  Negative for falls.  Skin: Negative.   Neurological: Positive for sensory change (bilateral legs). Negative for dizziness, tingling, tremors, speech change, focal weakness, seizures and loss of consciousness.  Psychiatric/Behavioral: Positive for memory loss. Negative for depression, hallucinations, substance abuse and suicidal ideas. The patient is nervous/anxious. The patient does not have insomnia.     Objective:     Blood pressure 122/64, pulse 66, temperature (!) 97.5 F (36.4 C), height _0  (1.575 m), weight 98 lb 3.2 oz (44.5 kg), SpO2 97 %. Body mass index is 17.96 kg/m.  General appearance: alert, no distress, WD/WN,  female HEENT: normocephalic, sclerae anicteric, TMs pearly, nares patent, no discharge or erythema, pharynx normal Oral cavity: MMM, no lesions Neck: supple,  no thyromegaly, no masses Heart: RRR, normal S1, S2, no murmurs Lungs: CTA bilaterally, no wheezes, rhonchi, or rales Abdomen: +bs, soft, non tender, no rebound, distended, no masses, no hepatomegaly, no splenomegaly Musculoskeletal: nontender, no swelling, no obvious deformity Extremities: no edema, no cyanosis, no clubbing Pulses: 2+ symmetric, upper and lower extremities, normal cap refill Neurological: alert, oriented x 3, CN2-12 intact, strength normal upper extremities and lower extremities,DTRs 2+ throughout, no cerebellar signs, gait slow, antalgic Psychiatric: normal affect, behavior normal, pleasant  Breast: defer Gyn: defer  Rectal:   Medicare Attestation I have personally reviewed: The patient's medical and social history Their use of alcohol, tobacco or illicit drugs Their current medications and supplements The patient's functional ability including ADLs,fall risks, home safety risks, cognitive, and hearing and visual impairment Diet and physical activities Evidence for depression or mood  disorders  The patient's weight, height, BMI, and visual acuity have been recorded in the chart.  I have made referrals, counseling, and provided education to the patient based on review of the above and I have provided the patient with a written personalized care plan for preventive services.     Vicie Mutters, PA-C   05/25/2019

## 2019-05-23 ENCOUNTER — Encounter: Payer: Self-pay | Admitting: Physician Assistant

## 2019-05-23 ENCOUNTER — Ambulatory Visit (INDEPENDENT_AMBULATORY_CARE_PROVIDER_SITE_OTHER): Payer: Medicare HMO | Admitting: Physician Assistant

## 2019-05-23 ENCOUNTER — Other Ambulatory Visit: Payer: Self-pay

## 2019-05-23 VITALS — BP 122/64 | HR 66 | Temp 97.5°F | Ht 62.0 in | Wt 98.2 lb

## 2019-05-23 DIAGNOSIS — R7309 Other abnormal glucose: Secondary | ICD-10-CM

## 2019-05-23 DIAGNOSIS — I10 Essential (primary) hypertension: Secondary | ICD-10-CM | POA: Diagnosis not present

## 2019-05-23 DIAGNOSIS — E782 Mixed hyperlipidemia: Secondary | ICD-10-CM

## 2019-05-23 DIAGNOSIS — M816 Localized osteoporosis [Lequesne]: Secondary | ICD-10-CM

## 2019-05-23 DIAGNOSIS — Z79899 Other long term (current) drug therapy: Secondary | ICD-10-CM

## 2019-05-23 DIAGNOSIS — M5416 Radiculopathy, lumbar region: Secondary | ICD-10-CM

## 2019-05-23 DIAGNOSIS — G4733 Obstructive sleep apnea (adult) (pediatric): Secondary | ICD-10-CM | POA: Diagnosis not present

## 2019-05-23 DIAGNOSIS — J449 Chronic obstructive pulmonary disease, unspecified: Secondary | ICD-10-CM

## 2019-05-23 DIAGNOSIS — R413 Other amnesia: Secondary | ICD-10-CM

## 2019-05-23 DIAGNOSIS — E559 Vitamin D deficiency, unspecified: Secondary | ICD-10-CM | POA: Diagnosis not present

## 2019-05-23 DIAGNOSIS — D638 Anemia in other chronic diseases classified elsewhere: Secondary | ICD-10-CM

## 2019-05-23 DIAGNOSIS — R6889 Other general symptoms and signs: Secondary | ICD-10-CM | POA: Diagnosis not present

## 2019-05-23 DIAGNOSIS — E441 Mild protein-calorie malnutrition: Secondary | ICD-10-CM | POA: Diagnosis not present

## 2019-05-23 DIAGNOSIS — Z0000 Encounter for general adult medical examination without abnormal findings: Secondary | ICD-10-CM

## 2019-05-23 DIAGNOSIS — K219 Gastro-esophageal reflux disease without esophagitis: Secondary | ICD-10-CM

## 2019-05-23 DIAGNOSIS — E538 Deficiency of other specified B group vitamins: Secondary | ICD-10-CM | POA: Diagnosis not present

## 2019-05-23 DIAGNOSIS — I700 Atherosclerosis of aorta: Secondary | ICD-10-CM

## 2019-05-23 DIAGNOSIS — J438 Other emphysema: Secondary | ICD-10-CM

## 2019-05-23 DIAGNOSIS — Z0001 Encounter for general adult medical examination with abnormal findings: Secondary | ICD-10-CM

## 2019-05-23 DIAGNOSIS — C8231 Follicular lymphoma grade IIIa, lymph nodes of head, face, and neck: Secondary | ICD-10-CM

## 2019-05-23 DIAGNOSIS — N183 Chronic kidney disease, stage 3 unspecified: Secondary | ICD-10-CM

## 2019-05-23 DIAGNOSIS — G2581 Restless legs syndrome: Secondary | ICD-10-CM

## 2019-05-23 DIAGNOSIS — F419 Anxiety disorder, unspecified: Secondary | ICD-10-CM

## 2019-05-23 MED ORDER — ALPRAZOLAM 0.25 MG PO TABS: 0.2500 mg | ORAL_TABLET | Freq: Two times a day (BID) | ORAL | 0 refills | Status: DC | PRN

## 2019-05-23 MED ORDER — CITALOPRAM HYDROBROMIDE 10 MG PO TABS: 10.0000 mg | ORAL_TABLET | Freq: Every day | ORAL | 2 refills | Status: DC

## 2019-05-23 NOTE — Patient Instructions (Addendum)
Aims to exercise AT least 10 mins a day, this helps circulate blood and in a study has decreased dementia risk.  Keep your mind engaged! Try meditation and relaxation techniques Do not smoke or drink alcohol  Will start on celexa 10 mg- do this for 6 weeks and see how you do.  Can take xanax AS needed  If the celexa does not help we can start on Namenda for memory  Start Namenda (memantine) 10mg  tablets.  Take 1/2 tablet at bedtime for 7 days, then 1/2 tablet twice daily for 7 days, then 1/2 tablet in morning and 1 tablet at bedtime for 7 days, then 1 tablet twice daily.   Side effects include dizziness, headache, diarrhea or constipation.  Call with any questions or concerns.   Memory Compensation Strategies  1. Use "WARM" strategy.  W= write it down  A= associate it  R= repeat it  M= make a mental note  2.   You can keep a Social worker.  Use a 3-ring notebook with sections for the following: calendar, important names and phone numbers,  medications, doctors' names/phone numbers, lists/reminders, and a section to journal what you did  each day.   3.    Use a calendar to write appointments down.  4.    Write yourself a schedule for the day.  This can be placed on the calendar or in a separate section of the Memory Notebook.  Keeping a  regular schedule can help memory.  5.    Use medication organizer with sections for each day or morning/evening pills.  You may need help loading it  6.    Keep a basket, or pegboard by the door.  Place items that you need to take out with you in the basket or on the pegboard.  You may also want to  include a message board for reminders.  7.    Use sticky notes.  Place sticky notes with reminders in a place where the task is performed.  For example: " turn off the  stove" placed by the stove, "lock the door" placed on the door at eye level, " take your medications" on  the bathroom mirror or by the place where you normally take your  medications.  8.    Use alarms/timers.  Use while cooking to remind yourself to check on food or as a reminder to take your medicine, or as a  reminder to make a call, or as a reminder to perform another task, etc.   Vascular Dementia Dementia is a condition in which a person has problems with thinking, memory, and behavior that are severe enough to interfere with daily life. Vascular dementia is a type of dementia. It results from brain damage that is caused by the brain not getting enough blood. This condition may also be called vascular cognitive impairment. What are the causes? Vascular dementia is caused by conditions that lessen blood flow to the brain. Common causes of this condition include:  Multiple small strokes. These may happen without symptoms (silent stroke).  Major stroke.  Damage to small blood vessels in the brain (cerebral small vessel disease). What increases the risk? The following factors may make you more likely to develop this condition:  Having had a stroke.  Having high blood pressure (hypertension) or high cholesterol.  Having a disease that affects the heart or blood vessels.  Smoking.  Having diabetes.  Having metabolic syndrome.  Being obese.  Not being active.  Having depression.  Being over age 36. What are the signs or symptoms? Symptoms can vary from one person to another. Symptoms may be mild or severe depending on the amount of damage and which parts of the brain have been affected. Symptoms may begin suddenly or may develop slowly. Mental symptoms of vascular dementia may include:  Confusion.  Memory problems.  Poor attention and concentration.  Trouble understanding speech.  Depression.  Personality changes.  Trouble recognizing familiar people.  Agitation or aggression.  Paranoia.  Delusions or hallucinations. Physical symptoms of vascular dementia may include:  Weakness.  Poor balance.  Loss of bladder or bowel  control (incontinence).  Unsteady walking (gait).  Speaking problems. Behavioral symptoms of vascular dementia may include:  Getting lost in familiar places.  Problems with planning and judgment.  Trouble following instructions.  Social problems.  Emotional outbursts.  Trouble with daily activities and self-care.  Problems handling money. Symptoms may remain stable, or they may get worse over time. Symptoms of vascular dementia may be similar to those of Alzheimer's disease. The two conditions can occur together (mixed dementia). How is this diagnosed? Your health care provider will consider your medical history and symptoms or changes that are reported by friends and family. Your health care provider will do a physical exam and may order lab tests or other tests that check brain and nervous system function. Tests that may be done include:  Blood tests.  Brain imaging tests.  Tests of movement, speech, and other daily activities (neurological exam).  Tests of memory, thinking, and problem-solving (neuropsychological or neurocognitive testing). There is not a specific test to diagnose vascular dementia. Diagnosis may involve several specialists. These may include:  A health care provider who specializes in the brain and nervous system (neurologist).  A health provider who specializes in understanding how problems in the brain can alter behavior and cognitive function (neuropsychologist). How is this treated? There is no cure for vascular dementia. Brain damage that has already occurred cannot be reversed. Treatment depends on:  How severe the condition is.  Which parts of your brain have been affected.  Your overall health. Treatment measures aim to:  Treat the underlying cause of vascular dementia and manage risk factors. This may include: ? Controlling blood pressure. ? Lowering cholesterol. ? Treating diabetes. ? Quitting smoking. ? Losing weight or maintaining a  healthy weight. ? Eating a healthy, balanced diet. ? Getting regular exercise.  Manage symptoms.  Prevent further brain damage.  Improve the person's health and quality of life. Treatment for dementia may involve a team of health care providers, including:  A neurologist.  A provider who specializes in disorders of the mind (psychiatrist).  A provider who specializes in helping people learn daily living skills (occupational therapist).  A provider who focuses on speech and language changes (Electrical engineer).  A heart specialist (cardiologist).  A provider who helps people learn how to manage physical changes, such as movement and walking (exercise physiologist or physical therapist). Follow these instructions at home: Lifestyle  People with vascular dementia may need regular help at home or daily care from a family member or home health care worker. Home care for a person with vascular dementia depends on what caused the condition and how severe the symptoms are. General guidelines for caregivers include:  Help the person with dementia remember people, appointments, and daily activities.  Help the person with dementia manage his or her medicines.  Help family and friends learn about ways to communicate with the  person with dementia.  Create a safe living space to reduce the risk of injury or falls.  Find a support group to help caregivers and family cope with the effects of dementia.  General instructions  Help the person take over-the-counter and prescription medicines only as told by the health care provider.  Follow the health care provider's instructions for treating the condition that caused the dementia.  Make sure the person keeps all follow-up visits as told by the health care provider. This is important. Contact a health care provider if:  A fever develops.  New behavioral problems develop.  Problems with swallowing develop.  Confusion gets  worse.  Sleepiness gets worse. Get help right away if:  Loss of consciousness occurs.  There is a sudden loss of speech, balance, or thinking ability.  New numbness or paralysis occurs.  Sudden, severe headache occurs.  Vision is lost or suddenly gets worse in one or both eyes. Summary  Vascular dementia is a type of dementia. It results from brain damage that is caused by the brain not getting enough blood.  Vascular dementia is caused by conditions that lessen blood flow to the brain. Common causes of this condition include stroke and damage to small blood vessels in the brain.  Treatment focuses on treating the underlying cause of vascular dementia and managing any risk factors.  People with vascular dementia may need regular help at home or daily care from a family member or home health care worker.  Contact a health care provider if you or your caregiver notice any new symptoms. This information is not intended to replace advice given to you by your health care provider. Make sure you discuss any questions you have with your health care provider. Document Revised: 12/22/2017 Document Reviewed: 12/23/2017 Elsevier Patient Education  [place].

## 2019-05-24 LAB — COMPLETE METABOLIC PANEL WITH GFR
AG Ratio: 2.2 (calc) (ref 1.0–2.5)
ALT: 8 U/L (ref 6–29)
AST: 12 U/L (ref 10–35)
Albumin: 4.1 g/dL (ref 3.6–5.1)
Alkaline phosphatase (APISO): 65 U/L (ref 37–153)
BUN: 15 mg/dL (ref 7–25)
CO2: 33 mmol/L — ABNORMAL HIGH (ref 20–32)
Calcium: 9.2 mg/dL (ref 8.6–10.4)
Chloride: 105 mmol/L (ref 98–110)
Creat: 0.85 mg/dL (ref 0.60–0.93)
GFR, Est African American: 77 mL/min/{1.73_m2} (ref >=60)
GFR, Est Non African American: 66 mL/min/{1.73_m2} (ref >=60)
Globulin: 1.9 g/dL (calc) (ref 1.9–3.7)
Glucose, Bld: 91 mg/dL (ref 65–99)
Potassium: 4 mmol/L (ref 3.5–5.3)
Sodium: 145 mmol/L (ref 135–146)
Total Bilirubin: 0.4 mg/dL (ref 0.2–1.2)
Total Protein: 6 g/dL — ABNORMAL LOW (ref 6.1–8.1)

## 2019-05-24 LAB — CBC WITH DIFFERENTIAL/PLATELET
Absolute Monocytes: 412 cells/uL (ref 200–950)
Basophils Absolute: 20 cells/uL (ref 0–200)
Basophils Relative: 0.4 %
Eosinophils Absolute: 49 cells/uL (ref 15–500)
Eosinophils Relative: 1 %
HCT: 36.3 % (ref 35.0–45.0)
Hemoglobin: 12.1 g/dL (ref 11.7–15.5)
Lymphs Abs: 1176 cells/uL (ref 850–3900)
MCH: 31.5 pg (ref 27.0–33.0)
MCHC: 33.3 g/dL (ref 32.0–36.0)
MCV: 94.5 fL (ref 80.0–100.0)
MPV: 11.4 fL (ref 7.5–12.5)
Monocytes Relative: 8.4 %
Neutro Abs: 3244 cells/uL (ref 1500–7800)
Neutrophils Relative %: 66.2 %
Platelets: 182 10*3/uL (ref 140–400)
RBC: 3.84 10*6/uL (ref 3.80–5.10)
RDW: 13 % (ref 11.0–15.0)
Total Lymphocyte: 24 %
WBC: 4.9 10*3/uL (ref 3.8–10.8)

## 2019-05-24 LAB — HEMOGLOBIN A1C
Hgb A1c MFr Bld: 5.3 % of total Hgb (ref <5.7)
Mean Plasma Glucose: 105 (calc)
eAG (mmol/L): 5.8 (calc)

## 2019-05-24 LAB — LIPID PANEL
Cholesterol: 164 mg/dL (ref <200)
HDL: 52 mg/dL (ref >=50)
LDL Cholesterol (Calc): 87 mg/dL (calc)
Non-HDL Cholesterol (Calc): 112 mg/dL (calc) (ref <130)
Total CHOL/HDL Ratio: 3.2 (calc) (ref <5.0)
Triglycerides: 157 mg/dL — ABNORMAL HIGH (ref <150)

## 2019-05-24 LAB — IRON, TOTAL/TOTAL IRON BINDING CAP
%SAT: 32 % (calc) (ref 16–45)
Iron: 75 ug/dL (ref 45–160)
TIBC: 234 mcg/dL (calc) — ABNORMAL LOW (ref 250–450)

## 2019-05-24 LAB — VITAMIN D 25 HYDROXY (VIT D DEFICIENCY, FRACTURES): Vit D, 25-Hydroxy: 97 ng/mL (ref 30–100)

## 2019-05-24 LAB — TSH: TSH: 1.42 mIU/L (ref 0.40–4.50)

## 2019-05-24 LAB — MAGNESIUM: Magnesium: 1.7 mg/dL (ref 1.5–2.5)

## 2019-05-24 LAB — FERRITIN: Ferritin: 190 ng/mL (ref 16–288)

## 2019-05-25 DIAGNOSIS — E441 Mild protein-calorie malnutrition: Secondary | ICD-10-CM | POA: Insufficient documentation

## 2019-06-02 ENCOUNTER — Other Ambulatory Visit: Payer: Medicare HMO

## 2019-06-12 ENCOUNTER — Encounter: Payer: Self-pay | Admitting: Internal Medicine

## 2019-06-16 ENCOUNTER — Other Ambulatory Visit: Payer: Self-pay | Admitting: Physician Assistant

## 2019-06-16 DIAGNOSIS — Z1231 Encounter for screening mammogram for malignant neoplasm of breast: Secondary | ICD-10-CM

## 2019-07-03 NOTE — Progress Notes (Signed)
Assessment and Plan:  Mild malnutrition ([place]) Get on ensure  Aortic atherosclerosis ([place]) Control blood pressure, cholesterol, glucose, increase exercise.   Memory deficit Stop celexa, xanax- will start namenda, information given.  Monitor Normal neuro -     memantine (NAMENDA) 10 MG tablet; Take 1/2 tablet at bedtime for 7 days, then 1/2 tablet BID for 7 days, then 1/2 tablet in morning and 1 tablet at bedtime for 7 days, then 1 tablet BID    Future Appointments  Date Time Provider [place]  08/04/2019  1:00 PM [facility] MM 2 [facility]  08/04/2019  1:30 PM [facility] DX DEXA 1 [facility]BCGDG [facility]  08/31/2019  3:00 PM Unk Pinto, MD [facility] None  12/04/2019  2:00 PM Vicie Mutters, PA-C [facility] None  05/07/2020  8:30 AM [facility] LAB 1 [facility] None  05/07/2020  9:00 AM Heath Lark, MD [facility] None     HPI 79 y.o.female presents for follow up for anxiety and memory issues.  Patient is s/p treatment follicular lymphoma, right neck, following oncology Dr. Alvy Bimler.   Dr. Felix Ahmadi, OD earlier this year had abnormal left optic nerve, had negative CBC, ESR, CRP and negative MRI of brain and orbits.   She has COPD with OSA, on CPAP and respimet, trelegy was not as effective and cost more, wears CPAP nightly, 6-8 hours, states it is helping with breathing and energy, well controlled. Follows with Dr. Welford Roche PRN.   Last visit she was having anxiety attacks, made a mistake with her banking which may have been the banks fault but she has had excessive worry since that time. She has ben crying, shaking, tearful. She was started on low dose celexa and given short term xanax last visit, we did discuss aricept/namenda but wanted to treat anxiety first to see if this helped.  She states she is not taking celexa, feels like it was not helping.   She is still driving, no anxiety with driving. Some slow recall with directions but will remember it.   She is on 2 of  the gabapentin in the AM and 2 at night- she has not take any in a week.  Her magnesium was low before, she is now on a magnesium.   BMI is Body mass index is 19.02 kg/m. She has mild malnutrition.  Wt Readings from Last 3 Encounters:  07/04/19 104 lb (47.2 kg)  05/23/19 98 lb 3.2 oz (44.5 kg)  05/08/19 101 lb 12.8 oz (46.2 kg)     Patient Active Problem List   Diagnosis Date Noted  . Mild malnutrition ([place]) 05/25/2019  . Left displaced femoral neck fracture ([place]) 01/18/2018  . Lumbar radiculopathy 01/18/2018  . Aortic atherosclerosis ([place]) 01/10/2018  . Anemia, chronic disease 12/13/2017  . Follicular lymphoma grade iiia, lymph nodes of head, face, and neck ([place]) 02/11/2016  . OSA and COPD overlap syndrome ([place]) 12/05/2014  . Osteoporosis 12/05/2014  . Medication management 03/06/2014  . CKD (chronic kidney disease) stage 3, GFR 30-59 ml/min (HCC) 11/27/2013  . COPD (chronic obstructive pulmonary disease) with emphysema ([place]) 11/27/2013  . Essential hypertension   . GERD    . Hx of adenomatous colonic polyps   . Hyperlipidemia, mixed   . Abnormal glucose   . Vitamin D deficiency   . RLS (restless legs syndrome)   . Vitamin B12 deficiency   . Anxiety       Current Outpatient Medications (Cardiovascular):  .  rosuvastatin (CRESTOR) 5 MG tablet, Take 1 tablet (5 mg  total) by mouth at bedtime.  Current Outpatient Medications (Respiratory):  Marland Kitchen  Tiotropium Bromide-Olodaterol (STIOLTO RESPIMAT) 2.5-2.5 MCG/ACT AERS, Inhale 2 puffs into the lungs daily.  Current Outpatient Medications (Analgesics):  .  aspirin EC 81 MG tablet, Take 81 mg by mouth daily.   Current Outpatient Medications (Other):  Marland Kitchen  Coenzyme Q10 10 MG capsule, Take 10 mg by mouth 2 (two) times daily. Marland Kitchen  gabapentin (NEURONTIN) 300 MG capsule, Take 1 capsule 3 to 4 x /day as needed for Pain .  Magnesium 250 MG TABS, Take 250 mg by mouth daily.  .  potassium chloride (K-DUR,KLOR-CON) 10 MEQ tablet, Take 10-20  mEq by mouth See admin instructions. 63mq in the AM and in the PM .  vitamin C (ASCORBIC ACID) 500 MG tablet, Take 500 mg by mouth daily. .  memantine (NAMENDA) 10 MG tablet, Take 1/2 tablet at bedtime for 7 days, then 1/2 tablet BID for 7 days, then 1/2 tablet in morning and 1 tablet at bedtime for 7 days, then 1 tablet BID  No Known Allergies  ROS: all negative except above.   Physical Exam: Filed Weights   07/04/19 1433  Weight: 104 lb (47.2 kg)   BP 138/86   Pulse 75   Temp (!) 97.5 F (36.4 C)   Wt 104 lb (47.2 kg)   SpO2 98%   BMI 19.02 kg/m  General appearance: alert, no distress, WD/WN,  female HEENT: normocephalic, sclerae anicteric, TMs pearly, nares patent, no discharge or erythema, pharynx normal Oral cavity: MMM, no lesions Neck: supple,  no thyromegaly, no masses Heart: RRR, normal S1, S2, no murmurs Lungs: CTA bilaterally, no wheezes, rhonchi, or rales Abdomen: +bs, soft, non tender, no rebound, distended, no masses, no hepatomegaly, no splenomegaly Musculoskeletal: nontender, no swelling, no obvious deformity Extremities: no edema, no cyanosis, no clubbing Pulses: 2+ symmetric, upper and lower extremities, normal cap refill Neurological: alert, oriented x 3, CN2-12 intact, strength normal upper extremities and lower extremities,DTRs 2+ throughout, no cerebellar signs, gait slow, antalgic Psychiatric: normal affect, behavior normal, pleasant    AVicie Mutters PA-C 3:20 PM [facility]

## 2019-07-04 ENCOUNTER — Encounter: Payer: Self-pay | Admitting: Physician Assistant

## 2019-07-04 ENCOUNTER — Ambulatory Visit (INDEPENDENT_AMBULATORY_CARE_PROVIDER_SITE_OTHER): Payer: Medicare HMO | Admitting: Physician Assistant

## 2019-07-04 ENCOUNTER — Other Ambulatory Visit: Payer: Self-pay

## 2019-07-04 VITALS — BP 138/86 | HR 75 | Temp 97.5°F | Wt 104.0 lb

## 2019-07-04 DIAGNOSIS — I700 Atherosclerosis of aorta: Secondary | ICD-10-CM

## 2019-07-04 DIAGNOSIS — R413 Other amnesia: Secondary | ICD-10-CM

## 2019-07-04 DIAGNOSIS — E441 Mild protein-calorie malnutrition: Secondary | ICD-10-CM | POA: Diagnosis not present

## 2019-07-04 MED ORDER — MEMANTINE HCL 10 MG PO TABS: ORAL_TABLET | ORAL | 1 refills | Status: DC

## 2019-07-04 NOTE — Patient Instructions (Addendum)
Sign onto mychart to check your last labs.   Magnesium low add 250 mg with food to prevent diarrhea. Magnesium may help with muscle cramps, constipation, vitamin D and potassium absorption.   Start Namenda (memantine) 10mg  tablets.  Take 1/2 tablet at bedtime for 7 days, then 1/2 tablet twice daily for 7 days, then 1/2 tablet in morning and 1 tablet at bedtime for 7 days, then 1 tablet twice daily.   Side effects include dizziness, headache, diarrhea or constipation.  Call with any questions or concerns.   .Aims to exercise AT least 10 mins a day, this helps circulate blood and in a study has decreased dementia risk.  Keep your mind engaged! Try meditation and relaxation techniques Do not smoke or drink alcohol  Memory Compensation Strategies  1. Use "WARM" strategy.  W= write it down  A= associate it  R= repeat it  M= make a mental note  2.   You can keep a Social worker.  Use a 3-ring notebook with sections for the following: calendar, important names and phone numbers,  medications, doctors' names/phone numbers, lists/reminders, and a section to journal what you did  each day.   3.    Use a calendar to write appointments down.  4.    Write yourself a schedule for the day.  This can be placed on the calendar or in a separate section of the Memory Notebook.  Keeping a  regular schedule can help memory.  5.    Use medication organizer with sections for each day or morning/evening pills.  You may need help loading it  6.    Keep a basket, or pegboard by the door.  Place items that you need to take out with you in the basket or on the pegboard.  You may also want to  include a message board for reminders.  7.    Use sticky notes.  Place sticky notes with reminders in a place where the task is performed.  For example: " turn off the  stove" placed by the stove, "lock the door" placed on the door at eye level, " take your medications" on  the bathroom mirror or by the place where you  normally take your medications.  8.    Use alarms/timers.  Use while cooking to remind yourself to check on food or as a reminder to take your medicine, or as a  reminder to make a call, or as a reminder to perform another task, etc.

## 2019-08-04 ENCOUNTER — Ambulatory Visit
Admission: RE | Admit: 2019-08-04 | Discharge: 2019-08-04 | Disposition: A | Discharge status: Home / Self Care | Payer: Medicare HMO | Source: Ambulatory Visit | Attending: Physician Assistant | Admitting: Physician Assistant

## 2019-08-04 ENCOUNTER — Ambulatory Visit: Payer: Medicare HMO

## 2019-08-04 ENCOUNTER — Other Ambulatory Visit: Payer: Self-pay

## 2019-08-04 DIAGNOSIS — Z1231 Encounter for screening mammogram for malignant neoplasm of breast: Secondary | ICD-10-CM | POA: Diagnosis not present

## 2019-08-04 DIAGNOSIS — M810 Age-related osteoporosis without current pathological fracture: Secondary | ICD-10-CM | POA: Diagnosis not present

## 2019-08-04 DIAGNOSIS — M816 Localized osteoporosis [Lequesne]: Secondary | ICD-10-CM

## 2019-08-04 DIAGNOSIS — Z780 Asymptomatic menopausal state: Secondary | ICD-10-CM | POA: Diagnosis not present

## 2019-08-30 ENCOUNTER — Encounter: Payer: Self-pay | Admitting: Internal Medicine

## 2019-08-30 NOTE — Patient Instructions (Signed)

## 2019-08-30 NOTE — Progress Notes (Addendum)
Annual Screening/Preventative Visit & Comprehensive Evaluation &  Examination     This very nice 79 y.o. [employer] F presents for a Screening /Preventative Visit & comprehensive evaluation and management of multiple medical co-morbidities.  Patient has been followed for HTN, HLD, Prediabetes  and Vitamin D Deficiency.  Patient is also followed for  COPD & OSA/CPAP  Overlap. Patient was treated by Dr Alvy Bimler for a  Stage [id number] Head & Neck Follicular Lymphoma from Oct 2017 - Dec 2019 & is felt in remission.      Patient was prescribed Namenda in March, but stopped as Rx completed and she desire not to restart for perceived lack of benefit and consequent sedation.        HTN predates since 35. Patient's BP has been controlled at home and patient denies any cardiac symptoms as chest pain, palpitations, shortness of breath, dizziness or ankle swelling. Today's BP is at goal -  130/82.      Patient's hyperlipidemia is markedly improved  & controlled with diet and restarting her Rosuvastatin. Patient denies myalgias or other medication SE's. Last lipids were at goal:  Lab Results  Component Value Date   CHOL 164 05/23/2019   HDL 52 05/23/2019   LDLCALC 87 05/23/2019   TRIG 157 (H) 05/23/2019   CHOLHDL 3.2 05/23/2019       Patient is monitored expectantly for Glucose Intolerance  and patient denies reactive hypoglycemic symptoms, visual blurring, diabetic polys or paresthesias. Last A1c was Normal & at goal: Lab Results  Component Value Date   HGBA1C 5.3 05/23/2019       Finally, patient has history of Vitamin D Deficiency ("46" / 2008)  and last Vitamin D was at goal:  Lab Results  Component Value Date   VD25OH 97 05/23/2019    Current Outpatient Medications on File Prior to Visit  Medication Sig  . aspirin EC 81 MG tablet Take 81 mg by mouth daily.  . Coenzyme Q10 10 MG capsule Take 10 mg by mouth 2 (two) times daily.  Marland Kitchen gabapentin (NEURONTIN) 300 MG capsule Take 1 capsule 3 to 4 x /day  as needed for Pain  . Magnesium 250 MG TABS Take 250 mg by mouth daily.   . memantine (NAMENDA) 10 MG tablet Take 1/2 tablet at bedtime for 7 days, then 1/2 tablet BID for 7 days, then 1/2 tablet in morning and 1 tablet at bedtime for 7 days, then 1 tablet BID  . potassium chloride (K-DUR,KLOR-CON) 10 MEQ tablet Take 10-20 mEq by mouth See admin instructions. 53mEq in the AM and in the PM  . rosuvastatin (CRESTOR) 5 MG tablet Take 1 tablet (5 mg total) by mouth at bedtime.  . Tiotropium Bromide-Olodaterol (STIOLTO RESPIMAT) 2.5-2.5 MCG/ACT AERS Inhale 2 puffs into the lungs daily.  . vitamin C (ASCORBIC ACID) 500 MG tablet Take 500 mg by mouth daily.   No current facility-administered medications on file prior to visit.   No Known Allergies   Past Medical History:  Diagnosis Date  . Anxiety   . COPD (chronic obstructive pulmonary disease) ([place])   . Diverticulosis   . Diverticulosis of colon with hemorrhage   . GERD (gastroesophageal reflux disease)   . H/O total knee replacement, left 11/05/2017  . Hx of adenomatous colonic polyps   . Hyperlipidemia   . Hypertension   . Hypomagnesemia   . Neuropathy   . Peripheral neuropathy   . Prediabetes   . RLS (restless legs syndrome)   . Sleep  apnea    CPAP  . Vitamin B12 deficiency   . Vitamin D deficiency    Health Maintenance  Topic Date Due  . COVID-19 Vaccine (1) Never done  . TETANUS/TDAP  08/14/2018  . INFLUENZA VACCINE  11/05/2019  . DEXA SCAN  Completed  . PNA vac Low Risk Adult  Completed   Immunization History  Administered Date(s) Administered  . Influenza Split 01/18/2013  . Influenza, High Dose Seasonal PF 12/05/2014, 01/23/2016, 12/22/2016, 01/11/2018, 12/22/2018  . Influenza-Unspecified 01/17/2018  . Pneumococcal Conjugate-13 11/27/2013  . Pneumococcal Polysaccharide-23 11/19/2011  . Td 08/13/2008  . Zoster 04/06/2010    Last Colon -  05/18/2014 - Dr Delfin Edis - recc 5 yr f/u - due Feb 2021 - received recall  letter 02/.01/2020 from Dr Gerrit Heck recc an [facility] & dexaBMD - 08/04/2019  Past Surgical History:  Procedure Laterality Date  . APPENDECTOMY    . CATARACT EXTRACTION, BILATERAL  2017   Bilateral cataract Dr. Bing Plume  . COLONOSCOPY N/A 05/18/2014   Procedure: COLONOSCOPY;  Surgeon: Lafayette Dragon, MD;  Location: [facility];  Service: Endoscopy;  Laterality: N/A;  . DIRECT LARYNGOSCOPY Right 02/04/2016   Procedure: DIRECT LARYNGOSCOPY;  Surgeon: Rozetta Nunnery, MD;  Location: [facility];  Service: ENT;  Laterality: Right;  . LYMPH NODE BIOPSY Right 02/04/2016   Procedure: EXCISIONAL BIOPSY RIGHT NECK LYMPH NODE;  Surgeon: Rozetta Nunnery, MD;  Location: [facility];  Service: ENT;  Laterality: Right;  . TONSILLECTOMY    . TOTAL HIP ARTHROPLASTY Left 10/21/2017   Procedure: TOTAL HIP ARTHROPLASTY ANTERIOR APPROACH;  Surgeon: Leandrew Koyanagi, MD;  Location: [facility];  Service: Orthopedics;  Laterality: Left;   Family History  Problem Relation Age of Onset  . Stroke Mother   . Stroke Maternal Grandmother   . Colon cancer Neg Hx    Social History   Tobacco Use  . Smoking status: Former Smoker    Packs/day: 1.00    Years: 30.00    Pack years: 30.00    Types: Cigarettes    Quit date: 04/06/2012    Years since quitting: 7.4  . Smokeless tobacco: Never Used  Substance Use Topics  . Alcohol use: No  . Drug use: No    ROS Constitutional: Denies fever, chills, weight loss/gain, headaches, insomnia,  night sweats, and change in appetite. Does c/o fatigue. Eyes: Denies redness, blurred vision, diplopia, discharge, itchy, watery eyes.  ENT: Denies discharge, congestion, post nasal drip, epistaxis, sore throat, earache, hearing loss, dental pain, Tinnitus, Vertigo, Sinus pain, snoring.  Cardio: Denies chest pain, palpitations, irregular heartbeat, syncope, dyspnea, diaphoresis, orthopnea, PND, claudication, edema Respiratory: denies cough,  dyspnea, DOE, pleurisy, hoarseness, laryngitis, wheezing.  Gastrointestinal: Denies dysphagia, heartburn, reflux, water brash, pain, cramps, nausea, vomiting, bloating, diarrhea, constipation, hematemesis, melena, hematochezia, jaundice, hemorrhoids Genitourinary: Denies dysuria, frequency, urgency, nocturia, hesitancy, discharge, hematuria, flank pain Breast: Breast lumps, nipple discharge, bleeding.  Musculoskeletal: Denies arthralgia, myalgia, stiffness, Jt. Swelling, pain, limp, and strain/sprain. Denies falls. Skin: Denies puritis, rash, hives, warts, acne, eczema, changing in skin lesion Neuro: No weakness, tremor, incoordination, spasms, paresthesia, pain Psychiatric: Denies confusion, memory loss, sensory loss. Denies Depression. Endocrine: Denies change in weight, skin, hair change, nocturia, and paresthesia, diabetic polys, visual blurring, hyper / hypo glycemic episodes.  Heme/Lymph: No excessive bleeding, bruising, enlarged lymph nodes.  Physical Exam  BP 130/82   Pulse 80   Temp (!) 97 F (36.1 C)  Resp 16   Ht 5\' 2"  (1.575 m)   Wt 107 lb (48.5 kg)   BMI 19.57 kg/m   General Appearance: Slender and in no apparent distress.  Eyes: PERRLA, EOMs, conjunctiva no swelling or erythema, normal fundi and vessels. Sinuses: No frontal/maxillary tenderness ENT/Mouth: EACs patent / TMs  nl. Nares clear without erythema, swelling, mucoid exudates. Oral hygiene is good. No erythema, swelling, or exudate. Tongue normal, non-obstructing. Tonsils not swollen or erythematous. Hearing normal.  Neck: Supple, thyroid not palpable. No bruits, nodes or JVD. Respiratory: Respiratory effort normal.  BS equal and clear bilateral without rales, rhonci, wheezing or stridor. Cardio: Heart sounds are normal with regular rate and rhythm and no murmurs, rubs or gallops. Peripheral pulses are normal and equal bilaterally without edema. No aortic or femoral bruits. Chest: symmetric with normal excursions  and percussion. Breasts: Symmetric, without lumps, nipple discharge, retractions, or fibrocystic changes.  Abdomen: Flat, soft with bowel sounds active. Nontender, no guarding, rebound, hernias, masses, or organomegaly.  Lymphatics: Non tender without lymphadenopathy.  Musculoskeletal: Full ROM all peripheral extremities, joint stability, 5/5 strength, and normal gait. Skin: Warm and dry without rashes, lesions, cyanosis, clubbing or  ecchymosis.  Neuro: Cranial nerves intact, reflexes equal bilaterally. Normal muscle tone, no cerebellar symptoms. Sensation intact.  Pysch: Alert and oriented X 3, normal affect, Insight and Judgment appropriate.   Assessment and Plan  1. Annual Preventative Screening Examination  2. Essential hypertension  - EKG 12-Lead - Urinalysis, Routine w reflex microscopic - Microalbumin / creatinine urine ratio - CBC with Differential/Platelet - COMPLETE METABOLIC PANEL WITH GFR - Magnesium - TSH  3. Hyperlipidemia, mixed  - EKG 12-Lead - Lipid panel - TSH  4. Abnormal glucose  - EKG 12-Lead - Hemoglobin A1c - Insulin, random  5. Vitamin D deficiency  - EKG 12-Lead - VITAMIN D 25 Hydroxy  6. Stage 3 chronic kidney disease, unspecified whether stage 3a or 3b CKD  - Urinalysis, Routine w reflex microscopic - Microalbumin / creatinine urine ratio - COMPLETE METABOLIC PANEL WITH GFR  7. Gastroesophageal reflux disease  - CBC with Differential/Platelet  8. OSA and COPD overlap syndrome ([place])   9. Screening for colorectal cancer  - POC Hemoccult Bld/Stl (3-Cd Home Screen); Future  10. Screening for ischemic heart disease  - EKG 12-Lead  11. Family hx-stroke  - EKG 12-Lead  12. Former smoker  - EKG 12-Lead  13. Aortic atherosclerosis ([facility])  - EKG 12-Lead  14. Medication management  - Urinalysis, Routine w reflex microscopic - Microalbumin / creatinine urine ratio - CBC with Differential/Platelet - COMPLETE METABOLIC PANEL  WITH GFR - Magnesium - Lipid panel - TSH - Hemoglobin A1c - Insulin, random - VITAMIN D 25 Hydroxy        Patient was counseled in prudent diet to achieve/maintain BMI less than 25 for weight control, BP monitoring, regular exercise and medications. Discussed med's effects and SE's. Screening labs and tests as requested with regular follow-up as recommended. Over 40 minutes of exam, counseling, chart review and high complex critical decision making was performed.   Kirtland Bouchard, MD

## 2019-08-31 ENCOUNTER — Ambulatory Visit (INDEPENDENT_AMBULATORY_CARE_PROVIDER_SITE_OTHER): Payer: Medicare HMO | Admitting: Internal Medicine

## 2019-08-31 ENCOUNTER — Other Ambulatory Visit: Payer: Self-pay

## 2019-08-31 VITALS — BP 130/82 | HR 80 | Temp 97.0°F | Resp 16 | Ht 62.0 in | Wt 107.0 lb

## 2019-08-31 DIAGNOSIS — N183 Chronic kidney disease, stage 3 unspecified: Secondary | ICD-10-CM | POA: Diagnosis not present

## 2019-08-31 DIAGNOSIS — I10 Essential (primary) hypertension: Secondary | ICD-10-CM | POA: Diagnosis not present

## 2019-08-31 DIAGNOSIS — Z136 Encounter for screening for cardiovascular disorders: Secondary | ICD-10-CM

## 2019-08-31 DIAGNOSIS — K2100 Gastro-esophageal reflux disease with esophagitis, without bleeding: Secondary | ICD-10-CM

## 2019-08-31 DIAGNOSIS — E559 Vitamin D deficiency, unspecified: Secondary | ICD-10-CM

## 2019-08-31 DIAGNOSIS — Z87891 Personal history of nicotine dependence: Secondary | ICD-10-CM

## 2019-08-31 DIAGNOSIS — K219 Gastro-esophageal reflux disease without esophagitis: Secondary | ICD-10-CM | POA: Diagnosis not present

## 2019-08-31 DIAGNOSIS — E782 Mixed hyperlipidemia: Secondary | ICD-10-CM

## 2019-08-31 DIAGNOSIS — Z79899 Other long term (current) drug therapy: Secondary | ICD-10-CM | POA: Diagnosis not present

## 2019-08-31 DIAGNOSIS — Z0000 Encounter for general adult medical examination without abnormal findings: Secondary | ICD-10-CM

## 2019-08-31 DIAGNOSIS — J449 Chronic obstructive pulmonary disease, unspecified: Secondary | ICD-10-CM

## 2019-08-31 DIAGNOSIS — R7309 Other abnormal glucose: Secondary | ICD-10-CM | POA: Diagnosis not present

## 2019-08-31 DIAGNOSIS — Z1212 Encounter for screening for malignant neoplasm of rectum: Secondary | ICD-10-CM

## 2019-08-31 DIAGNOSIS — Z1211 Encounter for screening for malignant neoplasm of colon: Secondary | ICD-10-CM

## 2019-08-31 DIAGNOSIS — I700 Atherosclerosis of aorta: Secondary | ICD-10-CM

## 2019-08-31 DIAGNOSIS — Z823 Family history of stroke: Secondary | ICD-10-CM

## 2019-08-31 DIAGNOSIS — G4733 Obstructive sleep apnea (adult) (pediatric): Secondary | ICD-10-CM

## 2019-08-31 DIAGNOSIS — Z0001 Encounter for general adult medical examination with abnormal findings: Secondary | ICD-10-CM

## 2019-08-31 MED ORDER — OMEPRAZOLE 40 MG PO CPDR: DELAYED_RELEASE_CAPSULE | ORAL | 0 refills | Status: DC

## 2019-08-31 NOTE — Addendum Note (Signed)
Addended by: Unk Pinto on: 08/31/2019 09:07 PM   Modules accepted: Orders

## 2019-09-01 LAB — CBC WITH DIFFERENTIAL/PLATELET
Absolute Monocytes: 355 cells/uL (ref 200–950)
Basophils Absolute: 38 cells/uL (ref 0–200)
Basophils Relative: 0.8 %
Eosinophils Absolute: 82 cells/uL (ref 15–500)
Eosinophils Relative: 1.7 %
HCT: 39.4 % (ref 35.0–45.0)
Hemoglobin: 13.1 g/dL (ref 11.7–15.5)
Lymphs Abs: 1114 cells/uL (ref 850–3900)
MCH: 31.3 pg (ref 27.0–33.0)
MCHC: 33.2 g/dL (ref 32.0–36.0)
MCV: 94 fL (ref 80.0–100.0)
MPV: 11.3 fL (ref 7.5–12.5)
Monocytes Relative: 7.4 %
Neutro Abs: 3211 cells/uL (ref 1500–7800)
Neutrophils Relative %: 66.9 %
Platelets: 173 10*3/uL (ref 140–400)
RBC: 4.19 10*6/uL (ref 3.80–5.10)
RDW: 13.1 % (ref 11.0–15.0)
Total Lymphocyte: 23.2 %
WBC: 4.8 10*3/uL (ref 3.8–10.8)

## 2019-09-01 LAB — URINALYSIS, ROUTINE W REFLEX MICROSCOPIC
Bilirubin Urine: NEGATIVE
Glucose, UA: NEGATIVE
Hgb urine dipstick: NEGATIVE
Ketones, ur: NEGATIVE
Leukocytes,Ua: NEGATIVE
Nitrite: NEGATIVE
Protein, ur: NEGATIVE
Specific Gravity, Urine: 1.018 (ref 1.001–1.03)
pH: >=8.5 — AB (ref 5.0–8.0)

## 2019-09-01 LAB — COMPLETE METABOLIC PANEL WITH GFR
AG Ratio: 2.2 (calc) (ref 1.0–2.5)
ALT: 8 U/L (ref 6–29)
AST: 14 U/L (ref 10–35)
Albumin: 4.3 g/dL (ref 3.6–5.1)
Alkaline phosphatase (APISO): 68 U/L (ref 37–153)
BUN/Creatinine Ratio: 22 (calc) (ref 6–22)
BUN: 22 mg/dL (ref 7–25)
CO2: 35 mmol/L — ABNORMAL HIGH (ref 20–32)
Calcium: 9.7 mg/dL (ref 8.6–10.4)
Chloride: 104 mmol/L (ref 98–110)
Creat: 1.02 mg/dL — ABNORMAL HIGH (ref 0.60–0.93)
GFR, Est African American: 61 mL/min/{1.73_m2} (ref >=60)
GFR, Est Non African American: 53 mL/min/{1.73_m2} — ABNORMAL LOW (ref >=60)
Globulin: 2 g/dL (calc) (ref 1.9–3.7)
Glucose, Bld: 85 mg/dL (ref 65–99)
Potassium: 5.2 mmol/L (ref 3.5–5.3)
Sodium: 145 mmol/L (ref 135–146)
Total Bilirubin: 0.4 mg/dL (ref 0.2–1.2)
Total Protein: 6.3 g/dL (ref 6.1–8.1)

## 2019-09-01 LAB — LIPID PANEL
Cholesterol: 226 mg/dL — ABNORMAL HIGH (ref <200)
HDL: 50 mg/dL (ref >=50)
LDL Cholesterol (Calc): 132 mg/dL (calc) — ABNORMAL HIGH
Non-HDL Cholesterol (Calc): 176 mg/dL (calc) — ABNORMAL HIGH (ref <130)
Total CHOL/HDL Ratio: 4.5 (calc) (ref <5.0)
Triglycerides: 288 mg/dL — ABNORMAL HIGH (ref <150)

## 2019-09-01 LAB — MICROALBUMIN / CREATININE URINE RATIO
Creatinine, Urine: 72 mg/dL (ref 20–275)
Microalb Creat Ratio: 4 mcg/mg creat (ref <30)
Microalb, Ur: 0.3 mg/dL

## 2019-09-01 LAB — MAGNESIUM: Magnesium: 1.9 mg/dL (ref 1.5–2.5)

## 2019-09-01 LAB — INSULIN, RANDOM: Insulin: 14.6 u[IU]/mL

## 2019-09-01 LAB — VITAMIN D 25 HYDROXY (VIT D DEFICIENCY, FRACTURES): Vit D, 25-Hydroxy: 57 ng/mL (ref 30–100)

## 2019-09-01 LAB — HEMOGLOBIN A1C
Hgb A1c MFr Bld: 5.2 % of total Hgb (ref <5.7)
Mean Plasma Glucose: 103 (calc)
eAG (mmol/L): 5.7 (calc)

## 2019-09-01 LAB — TSH: TSH: 1.41 mIU/L (ref 0.40–4.50)

## 2019-10-02 ENCOUNTER — Other Ambulatory Visit: Payer: Self-pay | Admitting: Internal Medicine

## 2019-10-02 DIAGNOSIS — M549 Dorsalgia, unspecified: Secondary | ICD-10-CM

## 2019-10-05 ENCOUNTER — Other Ambulatory Visit: Payer: Self-pay | Admitting: Physician Assistant

## 2019-10-06 ENCOUNTER — Ambulatory Visit: Payer: Medicare HMO

## 2019-11-13 ENCOUNTER — Other Ambulatory Visit: Payer: Self-pay | Admitting: Internal Medicine

## 2019-11-13 DIAGNOSIS — K219 Gastro-esophageal reflux disease without esophagitis: Secondary | ICD-10-CM

## 2019-12-04 ENCOUNTER — Other Ambulatory Visit: Payer: Self-pay

## 2019-12-04 ENCOUNTER — Encounter: Payer: Self-pay | Admitting: Physician Assistant

## 2019-12-04 ENCOUNTER — Ambulatory Visit (INDEPENDENT_AMBULATORY_CARE_PROVIDER_SITE_OTHER): Payer: Medicare HMO | Admitting: Physician Assistant

## 2019-12-04 VITALS — BP 130/74 | HR 95 | Temp 97.6°F | Wt 101.0 lb

## 2019-12-04 DIAGNOSIS — M816 Localized osteoporosis [Lequesne]: Secondary | ICD-10-CM

## 2019-12-04 DIAGNOSIS — E559 Vitamin D deficiency, unspecified: Secondary | ICD-10-CM

## 2019-12-04 DIAGNOSIS — Z79899 Other long term (current) drug therapy: Secondary | ICD-10-CM | POA: Diagnosis not present

## 2019-12-04 DIAGNOSIS — E782 Mixed hyperlipidemia: Secondary | ICD-10-CM

## 2019-12-04 DIAGNOSIS — G4733 Obstructive sleep apnea (adult) (pediatric): Secondary | ICD-10-CM

## 2019-12-04 DIAGNOSIS — R7309 Other abnormal glucose: Secondary | ICD-10-CM

## 2019-12-04 DIAGNOSIS — J449 Chronic obstructive pulmonary disease, unspecified: Secondary | ICD-10-CM

## 2019-12-04 DIAGNOSIS — I10 Essential (primary) hypertension: Secondary | ICD-10-CM | POA: Diagnosis not present

## 2019-12-04 DIAGNOSIS — I700 Atherosclerosis of aorta: Secondary | ICD-10-CM

## 2019-12-04 DIAGNOSIS — N183 Chronic kidney disease, stage 3 unspecified: Secondary | ICD-10-CM

## 2019-12-04 NOTE — Progress Notes (Signed)
follow up.   Assessment:   Osteoporosis Can not tolerate fosamax Will refer for prolia  Atherosclerosis of aorta Control blood pressure, cholesterol, glucose, increase exercise.   Essential hypertension - continue medications, DASH diet, exercise and monitor at home. Call if greater than 130/80.  - CBC with Differential/Platelet - CMP/GFR - TSH  Pulmonary emphysema, unspecified emphysema type Follow up pulmonary, continue inhaler.   Follicular lymphoma S/p treatment, followed by Dr. Alvy Bimler  Other abnormal glucose  Recent A1Cs at goal Discussed diet/exercise, weight management  Defer A1C; check CMP  CKD (chronic kidney disease) stage 3 Increase fluids, avoid NSAIDS, monitor sugars, will monitor - CMP WITH GFR  Hyperlipidemia -continue medications, check lipids, decrease fatty foods, increase activity.  - Lipid panel  Vitamin D deficiency - Vit D  25 hydroxy (rtn osteoporosis monitoring)   Medication management - Magnesium  Gastroesophageal reflux disease with esophagitis Continue PPI/H2 blocker, diet discussed   RLS (restless legs syndrome) Exercise  Mild malnutrition ([place]) Given ensure samples, will start doing one a day  OSA and COPD overlap syndrome ([place]) Sleep apnea- continue CPAP  Vitamin D deficiency -     VITAMIN D 25 Hydroxy (Vit-D Deficiency, Fractures)    Future Appointments  Date Time Provider [place]  03/06/2020  2:30 PM Vicie Mutters, PA-C [facility] None  05/07/2020  8:30 AM [facility] None  05/07/2020  9:00 AM Heath Lark, MD [facility] None  09/03/2020  2:00 PM Unk Pinto, MD [facility] None    .  Subjective:   Renee Hawkins is a 79 y.o. female who presents for follow up.   Patient is s/p treatment follicular lymphoma, right neck, following oncology Dr. Alvy Bimler.   She was started on nameda this year but stopped due to sedation.   Dr. Felix Ahmadi, OD earlier this year had abnormal left optic  nerve, had negative CBC, ESR, CRP and negative MRI of brain and orbits.   She has COPD with OSA, on CPAP and respimet, trelegy was not as effective and cost more, wears CPAP nightly, 6-8 hours, states it is helping with breathing and energy, well controlled. Follows with Dr. Welford Roche PRN.    Had Dexa 04/2017, went from -3.5 to -3.2, on fosamax x 2016.   She is still driving, no anxiety with driving. Some slow recall with directions but will remember it.   She is on 1 gabapentin 1 in AM and 1 at night.   BMI is Body mass index is 18.47 kg/m., she has been working on diet and exercise. Wt Readings from Last 3 Encounters:  12/04/19 101 lb (45.8 kg)  08/31/19 107 lb (48.5 kg)  07/04/19 104 lb (47.2 kg)   Her blood pressure has been controlled at home, today their BP is BP: 130/74 She does workout, still at [employer]. Walking with a neighbor, lives byself.  She denies chest pain, shortness of breath, dizziness.   She is on cholesterol medication, she is not on a cholesterol medication. Her cholesterol is not at goal. The cholesterol last visit was:   Lab Results  Component Value Date   CHOL 226 (H) 08/31/2019   HDL 50 08/31/2019   LDLCALC 132 (H) 08/31/2019   TRIG 288 (H) 08/31/2019   CHOLHDL 4.5 08/31/2019   Lab Results  Component Value Date   GFRNONAA 53 (L) 08/31/2019   She has been working on diet and exercise for glucose management, and denies nausea, paresthesia of the feet and polydipsia. Last A1C in the office  was:  Lab Results  Component Value Date   HGBA1C 5.2 08/31/2019   Patient is on Vitamin D supplement.   Lab Results  Component Value Date   VD25OH 57 08/31/2019     Lab Results  Component Value Date   GFRNONAA 53 (L) 08/31/2019      Medication Review   Current Outpatient Medications (Cardiovascular):  .  rosuvastatin (CRESTOR) 5 MG tablet, Take 1 tablet Daily for Cholesterol  Current Outpatient Medications (Respiratory):  Marland Kitchen  Tiotropium  Bromide-Olodaterol (STIOLTO RESPIMAT) 2.5-2.5 MCG/ACT AERS, Inhale 2 puffs into the lungs daily.  Current Outpatient Medications (Analgesics):  .  aspirin EC 81 MG tablet, Take 81 mg by mouth daily.   Current Outpatient Medications (Other):  Marland Kitchen  Coenzyme Q10 10 MG capsule, Take 10 mg by mouth 2 (two) times daily. Marland Kitchen  gabapentin (NEURONTIN) 300 MG capsule, TAKE 1 CAPSULE THREE TIMES DAILY TO FOUR TIMES DAILY AS NEEDED FOR PAIN .  Magnesium 250 MG TABS, Take 250 mg by mouth daily.  Marland Kitchen  omeprazole (PRILOSEC) 40 MG capsule, TAKE 1 CAPSULE DAILY FOR INDIGESTION AND REFLUX .  potassium chloride (K-DUR,KLOR-CON) 10 MEQ tablet, Take 10-20 mEq by mouth See admin instructions. 28mq in the AM and in the PM .  vitamin C (ASCORBIC ACID) 500 MG tablet, Take 500 mg by mouth daily.  Current Problems (verified) Patient Active Problem List   Diagnosis Date Noted  . Mild malnutrition ([place] 05/25/2019  . Left displaced femoral neck fracture ([place] 01/18/2018  . Lumbar radiculopathy 01/18/2018  . Aortic atherosclerosis ([place] 01/10/2018  . Anemia, chronic disease 12/13/2017  . Follicular lymphoma grade iiia, lymph nodes of head, face, and neck ([facility] 02/11/2016  . OSA and COPD overlap syndrome ([place] 12/05/2014  . Osteoporosis 12/05/2014  . Medication management 03/06/2014  . CKD (chronic kidney disease) stage 3, GFR 30-59 ml/min ([facility]) 11/27/2013  . COPD (chronic obstructive pulmonary disease) with emphysema ([place] 11/27/2013  . Essential hypertension   . GERD    . Hx of adenomatous colonic polyps   . Hyperlipidemia, mixed   . Abnormal glucose   . Vitamin D deficiency   . RLS (restless legs syndrome)   . Vitamin B12 deficiency   . Anxiety    Allergies No Known Allergies  SURGICAL HISTORY She  has a past surgical history that includes Appendectomy; Tonsillectomy; Colonoscopy (N/A, 05/18/2014); Lymph node biopsy (Right, 02/04/2016); Direct laryngoscopy (Right, 02/04/2016); Total hip arthroplasty (Left,  10/21/2017); and Cataract extraction, bilateral (2017). FAMILY HISTORY Her family history includes Stroke in her maternal grandmother and mother. SOCIAL HISTORY She  reports that she quit smoking about 7 years ago. Her smoking use included cigarettes. She has a 30.00 pack-year smoking history. She has never used smokeless tobacco. She reports that she does not drink alcohol and does not use drugs.  Review of Systems  Constitutional: Negative.   HENT: Negative.        + Snoring, better on CPAP  Respiratory: Positive for shortness of breath (improved with CPAP, not using O2 anymore). Negative for cough, hemoptysis, sputum production and wheezing.   Cardiovascular: Negative.   Gastrointestinal: Negative for abdominal pain, blood in stool, constipation, diarrhea, heartburn, melena, nausea and vomiting.  Genitourinary: Negative for dysuria, flank pain, frequency, hematuria and urgency.  Musculoskeletal: Negative.  Negative for falls.  Skin: Negative.   Neurological: Positive for sensory change (bilateral legs). Negative for dizziness, tingling, tremors, speech change, focal weakness, seizures and loss of consciousness.  Psychiatric/Behavioral: Positive for memory loss. Negative  for depression, hallucinations, substance abuse and suicidal ideas. The patient is nervous/anxious. The patient does not have insomnia.     Objective:     Blood pressure 130/74, pulse 95, temperature 97.6 F (36.4 C), weight 101 lb (45.8 kg), SpO2 92 %. Body mass index is 18.47 kg/m.  General appearance: alert, no distress, WD/WN,  female HEENT: normocephalic, sclerae anicteric, TMs pearly, nares patent, no discharge or erythema, pharynx normal Oral cavity: MMM, no lesions Neck: supple,  no thyromegaly, no masses Heart: RRR, normal S1, S2, no murmurs Lungs: CTA bilaterally, no wheezes, rhonchi, or rales Abdomen: +bs, soft, non tender, no rebound, distended, no masses, no hepatomegaly, no  splenomegaly Musculoskeletal: nontender, no swelling, no obvious deformity Extremities: no edema, no cyanosis, no clubbing Pulses: 2+ symmetric, upper and lower extremities, normal cap refill Neurological: alert, oriented x 3, CN2-12 intact, strength normal upper extremities and lower extremities,DTRs 2+ throughout, no cerebellar signs, gait slow, antalgic Psychiatric: normal affect, behavior normal, pleasant   Vicie Mutters, PA-C   12/04/2019

## 2019-12-04 NOTE — Patient Instructions (Addendum)
Can increase the gabapentin to 3 a day Can do 1 AM, lunch, and PM  Get your booster shot from [facility] for [facility]izer  Can get Sept/Oct of this year With your age and history you do qualify  Call [facility] about your spiriva

## 2019-12-05 LAB — COMPLETE METABOLIC PANEL WITH GFR
AG Ratio: 2.3 (calc) (ref 1.0–2.5)
ALT: 12 U/L (ref 6–29)
AST: 16 U/L (ref 10–35)
Albumin: 4.5 g/dL (ref 3.6–5.1)
Alkaline phosphatase (APISO): 65 U/L (ref 37–153)
BUN: 11 mg/dL (ref 7–25)
CO2: 35 mmol/L — ABNORMAL HIGH (ref 20–32)
Calcium: 9.3 mg/dL (ref 8.6–10.4)
Chloride: 103 mmol/L (ref 98–110)
Creat: 0.85 mg/dL (ref 0.60–0.93)
GFR, Est African American: 77 mL/min/{1.73_m2} (ref >=60)
GFR, Est Non African American: 66 mL/min/{1.73_m2} (ref >=60)
Globulin: 2 g/dL (calc) (ref 1.9–3.7)
Glucose, Bld: 94 mg/dL (ref 65–99)
Potassium: 3.6 mmol/L (ref 3.5–5.3)
Sodium: 145 mmol/L (ref 135–146)
Total Bilirubin: 0.6 mg/dL (ref 0.2–1.2)
Total Protein: 6.5 g/dL (ref 6.1–8.1)

## 2019-12-05 LAB — LIPID PANEL
Cholesterol: 213 mg/dL — ABNORMAL HIGH (ref <200)
HDL: 66 mg/dL (ref >=50)
LDL Cholesterol (Calc): 120 mg/dL (calc) — ABNORMAL HIGH
Non-HDL Cholesterol (Calc): 147 mg/dL (calc) — ABNORMAL HIGH (ref <130)
Total CHOL/HDL Ratio: 3.2 (calc) (ref <5.0)
Triglycerides: 156 mg/dL — ABNORMAL HIGH (ref <150)

## 2019-12-05 LAB — CBC WITH DIFFERENTIAL/PLATELET
Absolute Monocytes: 413 cells/uL (ref 200–950)
Basophils Absolute: 22 cells/uL (ref 0–200)
Basophils Relative: 0.4 %
Eosinophils Absolute: 28 cells/uL (ref 15–500)
Eosinophils Relative: 0.5 %
HCT: 42.4 % (ref 35.0–45.0)
Hemoglobin: 13.9 g/dL (ref 11.7–15.5)
Lymphs Abs: 869 cells/uL (ref 850–3900)
MCH: 31 pg (ref 27.0–33.0)
MCHC: 32.8 g/dL (ref 32.0–36.0)
MCV: 94.4 fL (ref 80.0–100.0)
MPV: 11.3 fL (ref 7.5–12.5)
Monocytes Relative: 7.5 %
Neutro Abs: 4169 cells/uL (ref 1500–7800)
Neutrophils Relative %: 75.8 %
Platelets: 164 10*3/uL (ref 140–400)
RBC: 4.49 10*6/uL (ref 3.80–5.10)
RDW: 12.3 % (ref 11.0–15.0)
Total Lymphocyte: 15.8 %
WBC: 5.5 10*3/uL (ref 3.8–10.8)

## 2019-12-05 LAB — HEMOGLOBIN A1C
Hgb A1c MFr Bld: 5.1 % of total Hgb (ref <5.7)
Mean Plasma Glucose: 100 (calc)
eAG (mmol/L): 5.5 (calc)

## 2019-12-05 LAB — TSH: TSH: 1.36 mIU/L (ref 0.40–4.50)

## 2019-12-05 LAB — MAGNESIUM: Magnesium: 1.8 mg/dL (ref 1.5–2.5)

## 2019-12-05 LAB — VITAMIN D 25 HYDROXY (VIT D DEFICIENCY, FRACTURES): Vit D, 25-Hydroxy: 50 ng/mL (ref 30–100)

## 2019-12-05 NOTE — Addendum Note (Signed)
Addended by: Vladimir Crofts on: 12/05/2019 08:35 AM   Modules accepted: Orders

## 2019-12-15 ENCOUNTER — Other Ambulatory Visit: Payer: Self-pay | Admitting: Internal Medicine

## 2019-12-15 MED ORDER — STIOLTO RESPIMAT 2.5-2.5 MCG/ACT IN AERS
INHALATION_SPRAY | RESPIRATORY_TRACT | 3 refills | Status: DC
Start: 2019-12-15 — End: 2020-10-05

## 2019-12-27 ENCOUNTER — Encounter: Payer: Self-pay | Admitting: Physician Assistant

## 2019-12-27 ENCOUNTER — Other Ambulatory Visit: Payer: Self-pay

## 2019-12-27 ENCOUNTER — Ambulatory Visit (INDEPENDENT_AMBULATORY_CARE_PROVIDER_SITE_OTHER): Payer: Medicare HMO | Admitting: Physician Assistant

## 2019-12-27 VITALS — BP 116/70 | HR 60 | Temp 97.8°F | Resp 12 | Wt 99.6 lb

## 2019-12-27 DIAGNOSIS — M816 Localized osteoporosis [Lequesne]: Secondary | ICD-10-CM | POA: Diagnosis not present

## 2019-12-27 DIAGNOSIS — R413 Other amnesia: Secondary | ICD-10-CM | POA: Diagnosis not present

## 2019-12-27 MED ORDER — ALENDRONATE SODIUM 70 MG PO TABS: 70.0000 mg | ORAL_TABLET | ORAL | 3 refills | Status: DC

## 2019-12-27 MED ORDER — DONEPEZIL HCL 10 MG PO TABS: 10.0000 mg | ORAL_TABLET | Freq: Every day | ORAL | 1 refills | Status: DC

## 2019-12-27 NOTE — Patient Instructions (Signed)
We will start donepezil (Aricept) 5mg  daily for four weeks.   If you are tolerating the medication, then after four weeks, we will increase the dose to 10mg  daily.   Side effects include nausea, vomiting, diarrhea, vivid dreams, and muscle cramps.   Please call the clinic if you experience any of these symptoms.   Start on the fosamax once a week Take full glass of water and sit up for 30 mins Stop the medication if you have any jaw pain  Can only be on it for 2-4 years If prolia the injection is cheaper I prefer that one  Alendronate weekly tablets What is this medicine? ALENDRONATE (a LEN droe nate) slows calcium loss from bones. It helps to make healthy bone and to slow bone loss in people with osteoporosis. It may be used to treat Paget's disease. This medicine may be used for other purposes; ask your health care provider or pharmacist if you have questions. COMMON BRAND NAME(S): Fosamax What should I tell my health care provider before I take this medicine? They need to know if you have any of these conditions:  esophagus, stomach, or intestine problems, like acid-reflux or GERD  dental disease  kidney disease  low blood calcium  low vitamin D  problems swallowing  problems sitting or standing for 30 minutes  an unusual or allergic reaction to alendronate, other medicines, foods, dyes, or preservatives  pregnant or trying to get pregnant  breast-feeding How should I use this medicine? You must take this medicine exactly as directed or you will lower the amount of medicine you absorb into your body or you may cause yourself harm. Take your dose by mouth first thing in the morning, after you are up for the day. Do not eat or drink anything before you take this medicine. Swallow your medicine with a full glass (6 to 8 fluid ounces) of plain water. Do not take this tablet with any other drink. Do not chew or crush the tablet. After taking this medicine, do not eat breakfast,  drink, or take any medicines or vitamins for at least 30 minutes. Stand or sit up for at least 30 minutes after you take this medicine; do not lie down. Take this medicine on the same day every week. Do not take your medicine more often than directed. Talk to your pediatrician regarding the use of this medicine in children. Special care may be needed. Overdosage: If you think you have taken too much of this medicine contact a poison control center or emergency room at once. NOTE: This medicine is only for you. Do not share this medicine with others. What if I miss a dose? If you miss a dose, take the dose on the morning after you remember. Then take your next dose on your regular day of the week. Never take 2 tablets on the same day. Do not take double or extra doses. What may interact with this medicine?  aluminum hydroxide  antacids  aspirin  calcium supplements  drugs for inflammation like ibuprofen, naproxen, and others  iron supplements  magnesium supplements  vitamins with minerals This list may not describe all possible interactions. Give your health care provider a list of all the medicines, herbs, non-prescription drugs, or dietary supplements you use. Also tell them if you smoke, drink alcohol, or use illegal drugs. Some items may interact with your medicine. What should I watch for while using this medicine? Visit your doctor or health care professional for regular checks ups.  It may be some time before you see benefit from this medicine. Do not stop taking your medication except on your doctor's advice. Your doctor or health care professional may order blood tests and other tests to see how you are doing. You should make sure you get enough calcium and vitamin D while you are taking this medicine, unless your doctor tells you not to. Discuss the foods you eat and the vitamins you take with your health care professional. Some people who take this medicine have severe bone,  joint, and/or muscle pain. This medicine may also increase your risk for a broken thigh bone. Tell your doctor right away if you have pain in your upper leg or groin. Tell your doctor if you have any pain that does not go away or that gets worse. This medicine can make you more sensitive to the sun. If you get a rash while taking this medicine, sunlight may cause the rash to get worse. Keep out of the sun. If you cannot avoid being in the sun, wear protective clothing and use sunscreen. Do not use sun lamps or tanning beds/booths. What side effects may I notice from receiving this medicine? Side effects that you should report to your doctor or health care professional as soon as possible:  allergic reactions like skin rash, itching or hives, swelling of the face, lips, or tongue  black or tarry stools  bone, muscle or joint pain  changes in vision  chest pain  heartburn or stomach pain  jaw pain, especially after dental work  pain or trouble when swallowing  redness, blistering, peeling or loosening of the skin, including inside the mouth Side effects that usually do not require medical attention (report to your doctor or health care professional if they continue or are bothersome):  changes in taste  diarrhea or constipation  eye pain or itching  headache  nausea or vomiting  stomach gas or fullness This list may not describe all possible side effects. Call your doctor for medical advice about side effects. You may report side effects to FDA at 1-800-FDA-1088. Where should I keep my medicine? Keep out of the reach of children. Store at room temperature of 15 and 30 degrees C (59 and 86 degrees F). Throw away any unused medicine after the expiration date. NOTE: This sheet is a summary. It may not cover all possible information. If you have questions about this medicine, talk to your doctor, pharmacist, or health care provider.  2020 Elsevier/Gold Standard (2011-02-05  09:02:42)

## 2019-12-27 NOTE — Progress Notes (Signed)
   Subjective:    Patient ID: Renee Hawkins, female    DOB: 1940-04-08, 79 y.o.   MRN: [id number]  HPI 79 y.o. WF presents with history of osteoporosis.  She was on fosamax 2016 to 2019 but it was stopped due to length of being on it. Her dexa in 2016 was -3.5, then in 2019 was -3.2, and most recent was -3.1. She was sent for prolia injections but the cost is 250 dollars a shot and insurance will not cover it.    Review of Systems     Objective:   Physical Exam Constitutional:      General: She is not in acute distress.    Appearance: Normal appearance. She is not ill-appearing.  Eyes:     Pupils: Pupils are equal, round, and reactive to light.  Musculoskeletal:        General: Normal range of motion.  Skin:    General: Skin is warm and dry.  Neurological:     General: No focal deficit present.     Mental Status: She is alert and oriented to person, place, and time.  Psychiatric:        Mood and Affect: Mood normal.        Behavior: Behavior normal.        Thought Content: Thought content normal.        Judgment: Judgment normal.           Assessment & Plan:  Nona was seen today for other.  Diagnoses and all orders for this visit:  Localized osteoporosis without current pathological fracture -     alendronate (FOSAMAX) 70 MG tablet; Take 1 tablet (70 mg total) by mouth once a week.  Memory deficit -     donepezil (ARICEPT) 10 MG tablet; Take 1 tablet (10 mg total) by mouth at bedtime.   Future Appointments  Date Time Provider [place]  03/06/2020  2:30 PM Garnet Sierras, NP [facility] None  05/07/2020  8:30 AM [facility] None  05/07/2020  9:00 AM Heath Lark, MD [facility] None  09/03/2020  2:00 PM Unk Pinto, MD [facility] None

## 2020-03-06 ENCOUNTER — Ambulatory Visit (INDEPENDENT_AMBULATORY_CARE_PROVIDER_SITE_OTHER): Payer: Medicare HMO | Admitting: Adult Health Nurse Practitioner

## 2020-03-06 ENCOUNTER — Encounter: Payer: Self-pay | Admitting: Adult Health Nurse Practitioner

## 2020-03-06 ENCOUNTER — Other Ambulatory Visit: Payer: Self-pay

## 2020-03-06 VITALS — BP 136/80 | HR 88 | Ht 62.0 in | Wt 106.0 lb

## 2020-03-06 DIAGNOSIS — Z681 Body mass index (BMI) 19 or less, adult: Secondary | ICD-10-CM

## 2020-03-06 DIAGNOSIS — E782 Mixed hyperlipidemia: Secondary | ICD-10-CM

## 2020-03-06 DIAGNOSIS — I700 Atherosclerosis of aorta: Secondary | ICD-10-CM | POA: Diagnosis not present

## 2020-03-06 DIAGNOSIS — E559 Vitamin D deficiency, unspecified: Secondary | ICD-10-CM

## 2020-03-06 DIAGNOSIS — G4733 Obstructive sleep apnea (adult) (pediatric): Secondary | ICD-10-CM | POA: Diagnosis not present

## 2020-03-06 DIAGNOSIS — N183 Chronic kidney disease, stage 3 unspecified: Secondary | ICD-10-CM | POA: Diagnosis not present

## 2020-03-06 DIAGNOSIS — I10 Essential (primary) hypertension: Secondary | ICD-10-CM | POA: Diagnosis not present

## 2020-03-06 DIAGNOSIS — J438 Other emphysema: Secondary | ICD-10-CM | POA: Diagnosis not present

## 2020-03-06 DIAGNOSIS — J449 Chronic obstructive pulmonary disease, unspecified: Secondary | ICD-10-CM

## 2020-03-06 DIAGNOSIS — R7309 Other abnormal glucose: Secondary | ICD-10-CM | POA: Diagnosis not present

## 2020-03-06 NOTE — Patient Instructions (Addendum)
   Check at home to see if you are taking Rosuvastatin (Crestor) 5mg  daily.  Send [place] a message to let us know.  Think about when your leg cramps started? Did it start when you started this medication?   Increase your water intake.  For every Pepsi, drink a flavored water.  This could help with your leg cramps.     GENERAL HEALTH GOALS  Know what a healthy weight is for you (roughly BMI <25) and aim to maintain this  Aim for 7+ servings of fruits and vegetables daily  70-80+ fluid ounces of water or unsweet tea for healthy kidneys  Limit to max 1 drink of alcohol per day; avoid smoking/tobacco  Limit animal fats in diet for cholesterol and heart health - choose grass fed whenever available  Avoid highly processed foods, and foods high in saturated/trans fats  Aim for low stress - take time to unwind and care for your mental health  Aim for 150 min of moderate intensity exercise weekly for heart health, and weights twice weekly for bone health  Aim for 7-9 hours of sleep daily

## 2020-03-06 NOTE — Progress Notes (Signed)
FOLLOW UP 6 MONTHS  Assessment:    Essential hypertension - continue medications, DASH diet, exercise and monitor at home. Call if greater than 130/80.  - CBC with Differential/Platelet - CMP/GFR - TSH  Hyperlipidemia -continue medications, check lipids, decrease fatty foods, increase activity.  - Lipid panel   Other abnormal glucose  Recent A1Cs at goal Discussed diet/exercise, weight management  Defer A1C; check CMP  Vitamin D deficiency Continue supplementation to maintain goal of 70-100  OSA and COPD overlap syndrome (HCC) Continue CPAP, using nightly for at least 8 hours  Helping with daytime fatigue Weight loss still advised Discussed mask & tubing hygeine  Pulmonary emphysema, unspecified emphysema type Follow up pulmonary, continue inhaler.  Using stiolto repsimat 2.5.-2.5  CKD (chronic kidney disease) stage 3 Increase fluids  Avoid NSAIDS Blood pressure control Monitor sugars  Will continue to monitor - CMP WITH GFR  Osteoporosis Can not tolerate fosamax  Atherosclerosis of aorta Per CT 05/05/2018 Control blood pressure, lipids and glucose Disscused lifestyle modifications, diet & exercise Continue to monitor   Medication management - Magnesium  BMI 19 Mild malnutrition (HCC) Given ensure samples, will start doing one a day   Further disposition pending results if labs check today. Discussed med's effects and SE's.   Over 30 minutes of face to face interview, exam, counseling, chart review, and critical decision making was performed.    Future Appointments  Date Time Provider [place]  05/07/2020  8:30 AM [facility] None  05/07/2020  9:00 AM Heath Lark, MD [facility] None  09/03/2020  2:00 PM Unk Pinto, MD [facility] None    .  Subjective:   Renee Hawkins is a 79 y.o. female who presents for follow up.   She lives by herself with her little shitzu dog. She is active and walks with a  neighbor.  Patient is s/p treatment follicular lymphoma, right neck, following oncology Dr. Alvy Bimler.   She was started on nameda this year but stopped due to sedation.   Dr. Felix Ahmadi, OD earlier this year had abnormal left optic nerve, had negative CBC, ESR, CRP and negative MRI of brain and orbits.   She has COPD with OSA, on CPAP and respimet, trelegy was not as effective and cost more, wears CPAP nightly, 6-8 hours, states it is helping with breathing and energy, well controlled. Follows with Dr. Welford Roche PRN.    Had Dexa 04/2017, went from -3.5 to -3.2, on fosamax x 2016. Tolerating well.  She is still driving, no anxiety with driving. Some slow recall with directions but will remember it.   She is on gabapentin 332m, one in AM and one at night.   BMI is Body mass index is 19.39 kg/m., she has been working on diet and exercise. Wt Readings from Last 3 Encounters:  03/06/20 106 lb (48.1 kg)  12/27/19 99 lb 9.6 oz (45.2 kg)  12/04/19 101 lb (45.8 kg)   Her blood pressure has been controlled at home, today their BP is BP: 136/80 She does workout, still at [employer]. Walking with a neighbor, lives byself.  She denies chest pain, shortness of breath, dizziness.   She is on cholesterol medication, she is not on a cholesterol medication. Her cholesterol is not at goal. The cholesterol last visit was:   Lab Results  Component Value Date   CHOL 213 (H) 12/04/2019   HDL 66 12/04/2019   LDLCALC 120 (H) 12/04/2019   TRIG 156 (H) 12/04/2019   CHOLHDL 3.2  12/04/2019   Lab Results  Component Value Date   GFRNONAA 66 12/04/2019   She has been working on diet and exercise for glucose management, and denies nausea, paresthesia of the feet and polydipsia. Last A1C in the office was:  Lab Results  Component Value Date   HGBA1C 5.1 12/04/2019   Patient is on Vitamin D supplement.   Lab Results  Component Value Date   VD25OH 50 12/04/2019     Lab Results  Component  Value Date   GFRNONAA 66 12/04/2019      Medication Review  Current Outpatient Medications (Endocrine & Metabolic):    alendronate (FOSAMAX) 70 MG tablet, Take 1 tablet (70 mg total) by mouth once a week.  Current Outpatient Medications (Cardiovascular):    rosuvastatin (CRESTOR) 5 MG tablet, Take 1 tablet Daily for Cholesterol  Current Outpatient Medications (Respiratory):    Tiotropium Bromide-Olodaterol (STIOLTO RESPIMAT) 2.5-2.5 MCG/ACT AERS, Use     2 Inhalations      Daily  Current Outpatient Medications (Analgesics):    aspirin EC 81 MG tablet, Take 81 mg by mouth daily.   Current Outpatient Medications (Other):    Coenzyme Q10 10 MG capsule, Take 10 mg by mouth 2 (two) times daily.   donepezil (ARICEPT) 10 MG tablet, Take 1 tablet (10 mg total) by mouth at bedtime.   gabapentin (NEURONTIN) 300 MG capsule, TAKE 1 CAPSULE THREE TIMES DAILY TO FOUR TIMES DAILY AS NEEDED FOR PAIN   Magnesium 250 MG TABS, Take 250 mg by mouth daily.    omeprazole (PRILOSEC) 40 MG capsule, TAKE 1 CAPSULE DAILY FOR INDIGESTION AND REFLUX   potassium chloride (K-DUR,KLOR-CON) 10 MEQ tablet, Take 10-20 mEq by mouth See admin instructions. 63mq in the AM and in the PM   vitamin C (ASCORBIC ACID) 500 MG tablet, Take 500 mg by mouth daily.  Current Problems (verified) Patient Active Problem List   Diagnosis Date Noted   Mild malnutrition ([facility] 05/25/2019   Left displaced femoral neck fracture ([facility] 01/18/2018   Lumbar radiculopathy 01/18/2018   Aortic atherosclerosis ([facility] 01/10/2018   Anemia, chronic disease 045/80/9983  Follicular lymphoma grade iiia, lymph nodes of head, face, and neck ([facility] 02/11/2016   OSA and COPD overlap syndrome ([facility] [place] 12/05/2014   Osteoporosis 12/05/2014   Medication management 03/06/2014   CKD (chronic kidney disease) stage 3, GFR 30-59 ml/min (HCC) 11/27/2013   COPD (chronic obstructive pulmonary disease) with emphysema ([facility] 11/27/2013    Essential hypertension    GERD     Hx of adenomatous colonic polyps    Hyperlipidemia, mixed    Abnormal glucose    Vitamin D deficiency    RLS (restless legs syndrome)    Vitamin B12 deficiency    Anxiety    Allergies No Known Allergies  SURGICAL HISTORY She  has a past surgical history that includes Appendectomy; Tonsillectomy; Colonoscopy (N/A, 05/18/2014); Lymph node biopsy (Right, 02/04/2016); Direct laryngoscopy (Right, 02/04/2016); Total hip arthroplasty (Left, 10/21/2017); and Cataract extraction, bilateral (2017). FAMILY HISTORY Her family history includes Stroke in her maternal grandmother and mother. SOCIAL HISTORY She  reports that she quit smoking about 7 years ago. Her smoking use included cigarettes. She has a 30.00 pack-year smoking history. She has never used smokeless tobacco. She reports that she does not drink alcohol and does not use drugs.  Review of Systems  Constitutional: Negative.   HENT: Negative.        + Snoring, better on CPAP  Respiratory: Negative for cough, hemoptysis,  sputum production, shortness of breath (improved with CPAP, not using O2 anymore) and wheezing.   Cardiovascular: Negative.   Gastrointestinal: Negative for abdominal pain, blood in stool, constipation, diarrhea, heartburn, melena, nausea and vomiting.  Genitourinary: Negative for dysuria, flank pain, frequency, hematuria and urgency.  Musculoskeletal: Negative.  Negative for falls.  Skin: Negative.   Neurological: Positive for sensory change (bilateral legs). Negative for dizziness, tingling, tremors, speech change, focal weakness, seizures and loss of consciousness.  Psychiatric/Behavioral: Positive for memory loss. Negative for depression, hallucinations, substance abuse and suicidal ideas. The patient is not nervous/anxious and does not have insomnia.     Objective:     Blood pressure 136/80, pulse 88, height 5' 2" (1.575 m), weight 106 lb (48.1 kg), SpO2 96 %. Body mass  index is 19.39 kg/m.  General appearance: alert, no distress, WD/WN,  female HEENT: normocephalic, sclerae anicteric, TMs pearly, nares patent, no discharge or erythema, pharynx normal Oral cavity: MMM, no lesions Neck: supple,  no thyromegaly, no masses Heart: RRR, normal S1, S2, no murmurs Lungs: CTA bilaterally, no wheezes, rhonchi, or rales Abdomen: +bs, soft, non tender, no rebound, distended, no masses, no hepatomegaly, no splenomegaly Musculoskeletal: nontender, no swelling, no obvious deformity Extremities: no edema, no cyanosis, no clubbing Pulses: 2+ symmetric, upper and lower extremities, normal cap refill Neurological: alert, oriented x 3, CN2-12 intact, strength normal upper extremities and lower extremities,DTRs 2+ throughout, no cerebellar signs, gait slow, antalgic Psychiatric: normal affect, behavior normal, pleasant   Garnet Sierras, NP   03/06/2020

## 2020-03-07 LAB — LIPID PANEL
Cholesterol: 222 mg/dL — ABNORMAL HIGH (ref <200)
HDL: 66 mg/dL (ref >=50)
LDL Cholesterol (Calc): 126 mg/dL (calc) — ABNORMAL HIGH
Non-HDL Cholesterol (Calc): 156 mg/dL (calc) — ABNORMAL HIGH (ref <130)
Total CHOL/HDL Ratio: 3.4 (calc) (ref <5.0)
Triglycerides: 188 mg/dL — ABNORMAL HIGH (ref <150)

## 2020-03-07 LAB — COMPLETE METABOLIC PANEL WITH GFR
AG Ratio: 2 (calc) (ref 1.0–2.5)
ALT: 14 U/L (ref 6–29)
AST: 21 U/L (ref 10–35)
Albumin: 4.3 g/dL (ref 3.6–5.1)
Alkaline phosphatase (APISO): 99 U/L (ref 37–153)
BUN/Creatinine Ratio: 19 (calc) (ref 6–22)
BUN: 20 mg/dL (ref 7–25)
CO2: 36 mmol/L — ABNORMAL HIGH (ref 20–32)
Calcium: 9.6 mg/dL (ref 8.6–10.4)
Chloride: 102 mmol/L (ref 98–110)
Creat: 1.06 mg/dL — ABNORMAL HIGH (ref 0.60–0.93)
GFR, Est African American: 59 mL/min/{1.73_m2} — ABNORMAL LOW (ref >=60)
GFR, Est Non African American: 51 mL/min/{1.73_m2} — ABNORMAL LOW (ref >=60)
Globulin: 2.1 g/dL (calc) (ref 1.9–3.7)
Glucose, Bld: 94 mg/dL (ref 65–99)
Potassium: 4.7 mmol/L (ref 3.5–5.3)
Sodium: 144 mmol/L (ref 135–146)
Total Bilirubin: 0.4 mg/dL (ref 0.2–1.2)
Total Protein: 6.4 g/dL (ref 6.1–8.1)

## 2020-03-07 LAB — CBC WITH DIFFERENTIAL/PLATELET
Absolute Monocytes: 371 cells/uL (ref 200–950)
Basophils Absolute: 28 cells/uL (ref 0–200)
Basophils Relative: 0.6 %
Eosinophils Absolute: 71 cells/uL (ref 15–500)
Eosinophils Relative: 1.5 %
HCT: 39.6 % (ref 35.0–45.0)
Hemoglobin: 13.3 g/dL (ref 11.7–15.5)
Lymphs Abs: 1184 cells/uL (ref 850–3900)
MCH: 31.9 pg (ref 27.0–33.0)
MCHC: 33.6 g/dL (ref 32.0–36.0)
MCV: 95 fL (ref 80.0–100.0)
MPV: 10.8 fL (ref 7.5–12.5)
Monocytes Relative: 7.9 %
Neutro Abs: 3046 cells/uL (ref 1500–7800)
Neutrophils Relative %: 64.8 %
Platelets: 182 10*3/uL (ref 140–400)
RBC: 4.17 10*6/uL (ref 3.80–5.10)
RDW: 12.9 % (ref 11.0–15.0)
Total Lymphocyte: 25.2 %
WBC: 4.7 10*3/uL (ref 3.8–10.8)

## 2020-03-10 ENCOUNTER — Other Ambulatory Visit: Payer: Self-pay | Admitting: Adult Health Nurse Practitioner

## 2020-03-10 DIAGNOSIS — E782 Mixed hyperlipidemia: Secondary | ICD-10-CM

## 2020-03-10 MED ORDER — ROSUVASTATIN CALCIUM 5 MG PO TABS: ORAL_TABLET | ORAL | 1 refills | Status: DC

## 2020-04-19 ENCOUNTER — Telehealth: Payer: Self-pay

## 2020-04-19 NOTE — Telephone Encounter (Signed)
Called and given below message. Rescheduled appts to 2/2, she is aware of appt times.

## 2020-04-19 NOTE — Telephone Encounter (Signed)
-----   Message from Heath Lark, MD sent at 04/19/2020  1:24 PM EST ----- Regarding: non urgent Her appt next month is early morning Can we move it to a bit later or other days? Thanks

## 2020-05-07 ENCOUNTER — Other Ambulatory Visit: Payer: Medicare HMO

## 2020-05-07 ENCOUNTER — Ambulatory Visit: Payer: Medicare HMO | Admitting: Hematology and Oncology

## 2020-05-08 ENCOUNTER — Inpatient Hospital Stay (HOSPITAL_BASED_OUTPATIENT_CLINIC_OR_DEPARTMENT_OTHER): Payer: Medicare HMO | Admitting: Hematology and Oncology

## 2020-05-08 ENCOUNTER — Inpatient Hospital Stay: Payer: Medicare HMO | Attending: Hematology and Oncology

## 2020-05-08 ENCOUNTER — Encounter: Payer: Self-pay | Admitting: Hematology and Oncology

## 2020-05-08 ENCOUNTER — Telehealth: Payer: Self-pay | Admitting: Hematology and Oncology

## 2020-05-08 ENCOUNTER — Other Ambulatory Visit: Payer: Self-pay

## 2020-05-08 DIAGNOSIS — E876 Hypokalemia: Secondary | ICD-10-CM | POA: Diagnosis not present

## 2020-05-08 DIAGNOSIS — Z9221 Personal history of antineoplastic chemotherapy: Secondary | ICD-10-CM | POA: Diagnosis not present

## 2020-05-08 DIAGNOSIS — I10 Essential (primary) hypertension: Secondary | ICD-10-CM

## 2020-05-08 DIAGNOSIS — C8231 Follicular lymphoma grade IIIa, lymph nodes of head, face, and neck: Secondary | ICD-10-CM | POA: Diagnosis not present

## 2020-05-08 DIAGNOSIS — Z79899 Other long term (current) drug therapy: Secondary | ICD-10-CM | POA: Diagnosis not present

## 2020-05-08 DIAGNOSIS — Z7982 Long term (current) use of aspirin: Secondary | ICD-10-CM | POA: Insufficient documentation

## 2020-05-08 DIAGNOSIS — Z8572 Personal history of non-Hodgkin lymphomas: Secondary | ICD-10-CM | POA: Diagnosis not present

## 2020-05-08 LAB — CBC WITH DIFFERENTIAL/PLATELET
Abs Immature Granulocytes: 0.01 10*3/uL (ref 0.00–0.07)
Basophils Absolute: 0 10*3/uL (ref 0.0–0.1)
Basophils Relative: 1 %
Eosinophils Absolute: 0.1 10*3/uL (ref 0.0–0.5)
Eosinophils Relative: 2 %
HCT: 43.4 % (ref 36.0–46.0)
Hemoglobin: 14.1 g/dL (ref 12.0–15.0)
Immature Granulocytes: 0 %
Lymphocytes Relative: 27 %
Lymphs Abs: 1.1 10*3/uL (ref 0.7–4.0)
MCH: 32 pg (ref 26.0–34.0)
MCHC: 32.5 g/dL (ref 30.0–36.0)
MCV: 98.4 fL (ref 80.0–100.0)
Monocytes Absolute: 0.2 10*3/uL (ref 0.1–1.0)
Monocytes Relative: 6 %
Neutro Abs: 2.7 10*3/uL (ref 1.7–7.7)
Neutrophils Relative %: 64 %
Platelets: 155 10*3/uL (ref 150–400)
RBC: 4.41 MIL/uL (ref 3.87–5.11)
RDW: 12.5 % (ref 11.5–15.5)
WBC: 4.2 10*3/uL (ref 4.0–10.5)
nRBC: 0 % (ref 0.0–0.2)

## 2020-05-08 LAB — COMPREHENSIVE METABOLIC PANEL
ALT: 13 U/L (ref 0–44)
AST: 18 U/L (ref 15–41)
Albumin: 4.3 g/dL (ref 3.5–5.0)
Alkaline Phosphatase: 80 U/L (ref 38–126)
Anion gap: 11 (ref 5–15)
BUN: 15 mg/dL (ref 8–23)
CO2: 29 mmol/L (ref 22–32)
Calcium: 9.5 mg/dL (ref 8.9–10.3)
Chloride: 104 mmol/L (ref 98–111)
Creatinine, Ser: 0.92 mg/dL (ref 0.44–1.00)
GFR, Estimated: >60 mL/min (ref >60)
Glucose, Bld: 84 mg/dL (ref 70–99)
Potassium: 3.4 mmol/L — ABNORMAL LOW (ref 3.5–5.1)
Sodium: 144 mmol/L (ref 135–145)
Total Bilirubin: 0.7 mg/dL (ref 0.3–1.2)
Total Protein: 7.3 g/dL (ref 6.5–8.1)

## 2020-05-08 NOTE — Assessment & Plan Note (Signed)
Her blood pressure is elevated today but could be due to anxiety Her documented blood pressure with her primary care doctor last December was within normal limits Observe closely for now

## 2020-05-08 NOTE — Assessment & Plan Note (Signed)
She has mild intermittent hypokalemia She is prescribed potassium supplement by her primary care doctor She will continue the same

## 2020-05-08 NOTE — Assessment & Plan Note (Signed)
Clinically, she has no signs of lymphoma Clinical exam is benign I plan to see her again at the end of the year The patient is educated to watch for signs and symptoms of recurrence

## 2020-05-08 NOTE — Telephone Encounter (Signed)
Scheduled appts per 2/2 sch msg. Gave pt a print out of AVS.  

## 2020-05-08 NOTE — Progress Notes (Signed)
[place] OFFICE PROGRESS NOTE  Patient Care Team: Unk Pinto, MD as PCP - General (Internal Medicine) Lafayette Dragon, MD (Inactive) as Consulting Physician (Gastroenterology) Loletha Carrow, MD (Pulmonary Disease) Heath Lark, MD as Consulting Physician (Hematology and Oncology) Rozetta Nunnery, MD as Consulting Physician (Otolaryngology) Chales Salmon, OD as Consulting Physician (Optometry)  ASSESSMENT & PLAN:  Follicular lymphoma grade iiia, lymph nodes of head, face, and neck ([place]) Clinically, she has no signs of lymphoma Clinical exam is benign I plan to see her again at the end of the year The patient is educated to watch for signs and symptoms of recurrence  Essential hypertension Her blood pressure is elevated today but could be due to anxiety Her documented blood pressure with her primary care doctor last December was within normal limits Observe closely for now  Hypokalemia She has mild intermittent hypokalemia She is prescribed potassium supplement by her primary care doctor She will continue the same   Orders Placed This Encounter  Procedures  . CBC with Differential/Platelet    Standing Status:   Standing    Number of Occurrences:   22    Standing Expiration Date:   05/08/2021  . Comprehensive metabolic panel    Standing Status:   Standing    Number of Occurrences:   33    Standing Expiration Date:   05/08/2021    All questions were answered. The patient knows to call the clinic with any problems, questions or concerns. The total time spent in the appointment was 20 minutes encounter with patients including review of chart and various tests results, discussions about plan of care and coordination of care plan   Heath Lark, MD 05/08/2020 10:54 AM  INTERVAL HISTORY: Please see below for problem oriented charting. She returns for further follow-up She is doing well I noticed that the patient is taking Aricept, likely due to early  cognitive decline She denies recent infection, fever or chills No new lymphadenopathy  SUMMARY OF ONCOLOGIC HISTORY: Oncology History  Follicular lymphoma grade iiia, lymph nodes of head, face, and neck ([place])  01/29/2016 Imaging   Ct neck showed right submandibular lymphadenopathy and asymmetric prominence of jugular chain nodes. Metastatic disease is the primary diagnostic concern and submandibular node sampling is recommended. No primary lesion is seen, limited at the oral cavity due to dental artifact. 2. Emphysema   02/04/2016 Pathology Results   Accession: [id number] Biopsy showed high grade follicular lymphoma   51/76/1607 PET scan   Hypermetabolic right submandibular and right level II/III lymph nodes, consistent with the given history of lymphoma. No hypermetabolic lymph nodes in the chest, abdomen or pelvis. Aortic atherosclerosis (ICD10-170.0). Coronary artery calcification. Emphysema (ICD10-J43.9).   02/24/2016 Imaging   ECHO showed LV The cavity size was normal. Systolic function was   normal. The estimated ejection fraction was in the range of 55% to 60%. Wall motion was normal; there were no regional wall motion abnormalities. Doppler parameters are consistent with abnormal left ventricular relaxation (grade 1 diastolic dysfunction).   03/13/2016 - 04/24/2016 Chemotherapy   The patient had R-CHOP chemo x 3 cycles   05/27/2016 PET scan   No metabolic activity associated with the RIGHT submandibular gland or RIGHT cervical lymph nodes consistent with a complete response. 2. Bilateral pulmonary nodules are not changed from comparison exam. 3. Diffuse mild esophagus metabolic activity suggests mild esophagitis.   06/12/2016 - 04/04/2018 Chemotherapy   The patient had consolidation treatment with maintenance Rituximab   05/05/2018 Imaging  1. No findings of active lymphoma in the chest, abdomen, or pelvis. No splenomegaly or focal splenic lesion. 2. Other imaging findings of  potential clinical significance: Aortic Atherosclerosis (ICD10-I70.0) and Emphysema (ICD10-J43.9). Coronary atherosclerosis. Small type 1 hiatal hernia. Chronic small basilar pulmonary nodules, long-term chronicity indicating benign etiology. Stable appearance of T12 anterior wedge compression. Cholelithiasis. Descending and sigmoid colon diverticulosis. Prominent stool throughout the colon favors constipation.     REVIEW OF SYSTEMS:   Constitutional: Denies fevers, chills or abnormal weight loss Eyes: Denies blurriness of vision Ears, nose, mouth, throat, and face: Denies mucositis or sore throat Respiratory: Denies cough, dyspnea or wheezes Cardiovascular: Denies palpitation, chest discomfort or lower extremity swelling Gastrointestinal:  Denies nausea, heartburn or change in bowel habits Skin: Denies abnormal skin rashes Lymphatics: Denies new lymphadenopathy or easy bruising Neurological:Denies numbness, tingling or new weaknesses Behavioral/Psych: Mood is stable, no new changes  All other systems were reviewed with the patient and are negative.  I have reviewed the past medical history, past surgical history, social history and family history with the patient and they are unchanged from previous note.  ALLERGIES:  has No Known Allergies.  MEDICATIONS:  Current Outpatient Medications  Medication Sig Dispense Refill  . alendronate (FOSAMAX) 70 MG tablet Take 1 tablet (70 mg total) by mouth once a week. 12 tablet 3  . aspirin EC 81 MG tablet Take 81 mg by mouth daily.    . Coenzyme Q10 10 MG capsule Take 10 mg by mouth 2 (two) times daily.    Marland Kitchen donepezil (ARICEPT) 10 MG tablet Take 1 tablet (10 mg total) by mouth at bedtime. 90 tablet 1  . gabapentin (NEURONTIN) 300 MG capsule TAKE 1 CAPSULE THREE TIMES DAILY TO FOUR TIMES DAILY AS NEEDED FOR PAIN 360 capsule 1  . Magnesium 250 MG TABS Take 250 mg by mouth daily.     Marland Kitchen omeprazole (PRILOSEC) 40 MG capsule TAKE 1 CAPSULE DAILY FOR  INDIGESTION AND REFLUX 90 capsule 3  . potassium chloride (K-DUR,KLOR-CON) 10 MEQ tablet Take 10-20 mEq by mouth See admin instructions. 80mEq in the AM and in the PM    . rosuvastatin (CRESTOR) 5 MG tablet Take 1 tablet Daily for Cholesterol 90 tablet 1  . Tiotropium Bromide-Olodaterol (STIOLTO RESPIMAT) 2.5-2.5 MCG/ACT AERS Use     2 Inhalations      Daily 12 g 3  . vitamin C (ASCORBIC ACID) 500 MG tablet Take 500 mg by mouth daily.     No current facility-administered medications for this visit.    PHYSICAL EXAMINATION: ECOG PERFORMANCE STATUS: 1 - Symptomatic but completely ambulatory  Vitals:   05/08/20 1006  BP: (!) 171/83  Pulse: 88  Resp: 16  Temp: (!) 97.4 F (36.3 C)  SpO2: 94%   Filed Weights   05/08/20 1006  Weight: 104 lb 12.8 oz (47.5 kg)    GENERAL:alert, no distress and comfortable SKIN: skin color, texture, turgor are normal, no rashes or significant lesions EYES: normal, Conjunctiva are pink and non-injected, sclera clear OROPHARYNX:no exudate, no erythema and lips, buccal mucosa, and tongue normal  NECK: supple, thyroid normal size, non-tender, without nodularity LYMPH:  no palpable lymphadenopathy in the cervical, axillary or inguinal LUNGS: clear to auscultation and percussion with normal breathing effort HEART: regular rate & rhythm and no murmurs and no lower extremity edema ABDOMEN:abdomen soft, non-tender and normal bowel sounds Musculoskeletal:no cyanosis of digits and no clubbing  NEURO: alert & oriented x 3 with fluent speech, no focal motor/sensory  deficits  LABORATORY DATA:  I have reviewed the data as listed    Component Value Date/Time   NA 144 05/08/2020 0947   NA 142 02/12/2017 0809   K 3.4 (L) 05/08/2020 0947   K 4.0 02/12/2017 0809   CL 104 05/08/2020 0947   CO2 29 05/08/2020 0947   CO2 28 02/12/2017 0809   GLUCOSE 84 05/08/2020 0947   GLUCOSE 97 02/12/2017 0809   BUN 15 05/08/2020 0947   BUN 11.2 02/12/2017 0809   CREATININE  0.92 05/08/2020 0947   CREATININE 1.06 (H) 03/06/2020 1559   CREATININE 0.9 02/12/2017 0809   CALCIUM 9.5 05/08/2020 0947   CALCIUM 9.3 02/12/2017 0809   PROT 7.3 05/08/2020 0947   PROT 6.7 02/12/2017 0809   ALBUMIN 4.3 05/08/2020 0947   ALBUMIN 3.8 02/12/2017 0809   AST 18 05/08/2020 0947   AST 16 02/12/2017 0809   ALT 13 05/08/2020 0947   ALT 15 02/12/2017 0809   ALKPHOS 80 05/08/2020 0947   ALKPHOS 88 02/12/2017 0809   BILITOT 0.7 05/08/2020 0947   BILITOT 0.40 02/12/2017 0809   GFRNONAA >60 05/08/2020 0947   GFRNONAA 51 (L) 03/06/2020 1559   GFRAA 59 (L) 03/06/2020 1559    No results found for: SPEP, UPEP  Lab Results  Component Value Date   WBC 4.2 05/08/2020   NEUTROABS 2.7 05/08/2020   HGB 14.1 05/08/2020   HCT 43.4 05/08/2020   MCV 98.4 05/08/2020   PLT 155 05/08/2020      Chemistry      Component Value Date/Time   NA 144 05/08/2020 0947   NA 142 02/12/2017 0809   K 3.4 (L) 05/08/2020 0947   K 4.0 02/12/2017 0809   CL 104 05/08/2020 0947   CO2 29 05/08/2020 0947   CO2 28 02/12/2017 0809   BUN 15 05/08/2020 0947   BUN 11.2 02/12/2017 0809   CREATININE 0.92 05/08/2020 0947   CREATININE 1.06 (H) 03/06/2020 1559   CREATININE 0.9 02/12/2017 0809      Component Value Date/Time   CALCIUM 9.5 05/08/2020 0947   CALCIUM 9.3 02/12/2017 0809   ALKPHOS 80 05/08/2020 0947   ALKPHOS 88 02/12/2017 0809   AST 18 05/08/2020 0947   AST 16 02/12/2017 0809   ALT 13 05/08/2020 0947   ALT 15 02/12/2017 0809   BILITOT 0.7 05/08/2020 0947   BILITOT 0.40 02/12/2017 0809

## 2020-05-11 ENCOUNTER — Other Ambulatory Visit: Payer: Self-pay | Admitting: Internal Medicine

## 2020-05-11 DIAGNOSIS — M549 Dorsalgia, unspecified: Secondary | ICD-10-CM

## 2020-06-17 ENCOUNTER — Ambulatory Visit (INDEPENDENT_AMBULATORY_CARE_PROVIDER_SITE_OTHER): Payer: Medicare HMO | Admitting: Adult Health Nurse Practitioner

## 2020-06-17 ENCOUNTER — Encounter: Payer: Self-pay | Admitting: Adult Health Nurse Practitioner

## 2020-06-17 ENCOUNTER — Other Ambulatory Visit: Payer: Self-pay

## 2020-06-17 VITALS — BP 110/68 | HR 93 | Temp 97.7°F | Wt 106.0 lb

## 2020-06-17 DIAGNOSIS — Z79899 Other long term (current) drug therapy: Secondary | ICD-10-CM

## 2020-06-17 DIAGNOSIS — Z0001 Encounter for general adult medical examination with abnormal findings: Secondary | ICD-10-CM

## 2020-06-17 DIAGNOSIS — R6889 Other general symptoms and signs: Secondary | ICD-10-CM | POA: Diagnosis not present

## 2020-06-17 DIAGNOSIS — Z681 Body mass index (BMI) 19 or less, adult: Secondary | ICD-10-CM

## 2020-06-17 DIAGNOSIS — I10 Essential (primary) hypertension: Secondary | ICD-10-CM

## 2020-06-17 DIAGNOSIS — E441 Mild protein-calorie malnutrition: Secondary | ICD-10-CM

## 2020-06-17 DIAGNOSIS — E782 Mixed hyperlipidemia: Secondary | ICD-10-CM

## 2020-06-17 DIAGNOSIS — I700 Atherosclerosis of aorta: Secondary | ICD-10-CM

## 2020-06-17 DIAGNOSIS — J439 Emphysema, unspecified: Secondary | ICD-10-CM

## 2020-06-17 DIAGNOSIS — R413 Other amnesia: Secondary | ICD-10-CM

## 2020-06-17 DIAGNOSIS — M545 Low back pain, unspecified: Secondary | ICD-10-CM

## 2020-06-17 DIAGNOSIS — G4733 Obstructive sleep apnea (adult) (pediatric): Secondary | ICD-10-CM | POA: Diagnosis not present

## 2020-06-17 DIAGNOSIS — R7309 Other abnormal glucose: Secondary | ICD-10-CM | POA: Diagnosis not present

## 2020-06-17 DIAGNOSIS — D638 Anemia in other chronic diseases classified elsewhere: Secondary | ICD-10-CM

## 2020-06-17 DIAGNOSIS — E538 Deficiency of other specified B group vitamins: Secondary | ICD-10-CM

## 2020-06-17 DIAGNOSIS — Z0000 Encounter for general adult medical examination without abnormal findings: Secondary | ICD-10-CM

## 2020-06-17 DIAGNOSIS — G8929 Other chronic pain: Secondary | ICD-10-CM

## 2020-06-17 DIAGNOSIS — J449 Chronic obstructive pulmonary disease, unspecified: Secondary | ICD-10-CM

## 2020-06-17 DIAGNOSIS — N183 Chronic kidney disease, stage 3 unspecified: Secondary | ICD-10-CM

## 2020-06-17 DIAGNOSIS — E559 Vitamin D deficiency, unspecified: Secondary | ICD-10-CM | POA: Diagnosis not present

## 2020-06-17 MED ORDER — GABAPENTIN 300 MG PO CAPS
ORAL_CAPSULE | ORAL | 0 refills | Status: DC
Start: 2020-06-17 — End: 2020-10-12

## 2020-06-17 NOTE — Progress Notes (Signed)
MEDICARE ANNUAL WELLNESS AND FOLLOW UP 3 MONTH  Assessment:   Encounter for Medicare Annual wellness examination Yearly  Essential hypertension - No medications, DASH diet, exercise and monitor at home. Call if greater than 130/80.  - CBC with Differential/Platelet - CMP/GFR - TSH  Hyperlipidemia Continue medications:Rosuvastatin 66m daily Discussed dietary and exercise modifications Low fat diet - Lipid panel  Atherosclerosis of aorta Per CT 05/05/2018 Control blood pressure, lipids and glucose Disscused lifestyle modifications, diet & exercise Continue to monitor  Other abnormal glucose  Recent A1Cs at goal Discussed diet/exercise, weight management  Defer A1C; check CMP  Vitamin D deficiency Continue supplementation to maintain goal of 70-100  OSA and COPD overlap syndrome (HCC) Continue CPAP, using nightly for at least 8 hours  Helping with daytime fatigue Weight loss still advised Discussed mask & tubing hygeine  Pulmonary emphysema, unspecified emphysema type Follow up pulmonary, continue inhaler.  Using stiolto repsimat 2.5.-2.5  CKD (chronic kidney disease) stage 3 Increase fluids  Avoid NSAIDS Blood pressure control Monitor sugars  Will continue to monitor - CMP WITH GFR  Osteoporosis Can not tolerate fosamax Prolia Q653month  Medication management - Magnesium  BMI 19 Mild malnutrition (HCC) Given ensure samples, will start doing one a day   Bilateral back pain RLS -     gabapentin (NEURONTIN) 300 MG capsule; Take  2 capsules up to  3x /day as needed for Pain   Memory deficit Aricept 1014mVitamin B12 deficiency Continue supplementation  Medication management Continue  Anemia, chronic disease Monitor yearly   Contact office with any new or worsening symptoms.  Further disposition pending results if labs check today. Discussed med's effects and SE's.   Over 30 minutes of face to face interview, exam, counseling, chart review,  and critical decision making was performed.    Future Appointments  Date Time Provider DepRiverview/19/2022  2:00 PM McKUnk PintoD [facility] None  02/06/2021 10:30 AM [facility] None  02/06/2021 11:00 AM GorHeath LarkD [facility] None  06/17/2021  2:30 PM Makel Mcmann, KyrDanton SewerP [facility] None    Plan:   During the course of the visit the patient was educated and counseled about appropriate screening and preventive services including:    Pneumococcal vaccine   Prevnar 13  Influenza vaccine  Td vaccine  Screening electrocardiogram  Bone densitometry screening  Colorectal cancer screening  Diabetes screening  Glaucoma screening  Nutrition counseling   Advanced directives: requested  .  Subjective:   Renee Hawkins a 80 15o. female who presents for Medicare annual wellness examination follow up.   She lives by herself with her little shitzu dog, Sassy [id number]. She is active and walks with a neighbor.  Patient is s/p treatment follicular lymphoma, right neck, following oncology Dr. GorAlvy Bimlerast OV was two weeks ago. Follow up as needed, no further scheduled.  She was started on nameda this year but stopped due to sedation. She is taking aricept  Dr. DunFelix AhmadiD earlier this year had abnormal left optic nerve, had negative CBC, ESR, CRP and negative MRI of brain and orbits.   She has COPD with OSA, on CPAP and respimet, trelegy was not as effective and cost more, wears CPAP nightly, 6-8 hours, states it is helping with breathing and energy, well controlled. Follows with Dr. DonWelford RocheN.    Had Dexa 04/2017, went from -3.5 to -3.2, on fosamax x 2016. Stopped this unabl to tolerate. Referral was placed for Prolia, discussed  with patient.  She is to call to follow through with appointment.  Will place second referral if necessary.  She is still driving, no anxiety with driving. Some slow recall with directions but will remember.  She  is on gabapentin 388m, two tablets BID.  She reports increasing symptoms.  Discussed adding third dose during the day.  She reports she eats maybe two meals a day.  She reports small breakfast, pound cake and coffee.  She typically has a sandwich for lunch.  She reports she knows she dies not drink enough water.  BMI is Body mass index is 19.39 kg/m., she has been working on diet and exercise. Wt Readings from Last 3 Encounters:  06/17/20 106 lb (48.1 kg)  05/08/20 104 lb 12.8 oz (47.5 kg)  03/06/20 106 lb (48.1 kg)   Her blood pressure has been controlled at home, today their BP is BP: 110/68 She does workout, still at [employer]. Walking with a neighbor, lives byself.  She denies chest pain, shortness of breath, dizziness.   She is on cholesterol medication, she is not on a cholesterol medication. Her cholesterol is not at goal. The cholesterol last visit was:   Lab Results  Component Value Date   CHOL 222 (H) 03/06/2020   HDL 66 03/06/2020   LDLCALC 126 (H) 03/06/2020   TRIG 188 (H) 03/06/2020   CHOLHDL 3.4 03/06/2020   Lab Results  Component Value Date   GFRNONAA >60 05/08/2020   She has been working on diet and exercise for glucose management, and denies nausea, paresthesia of the feet and polydipsia. Last A1C in the office was:  Lab Results  Component Value Date   HGBA1C 5.1 12/04/2019   Patient is on Vitamin D supplement.   Lab Results  Component Value Date   VD25OH 50 12/04/2019     Lab Results  Component Value Date   GFRNONAA >60 05/08/2020    MEDICARE WELLNESS OBJECTIVES: Physical activity: Current Exercise Habits: Home exercise routine, Type of exercise: walking, Time (Minutes): 30, Frequency (Times/Week): 7, Weekly Exercise (Minutes/Week): 210, Intensity: Moderate, Exercise limited by: None identified Cardiac risk factors: Cardiac Risk Factors include: advanced age (>521m, >6>28omen);dyslipidemia;hypertension Depression/mood screen:    [facility] 06/17/2020  Decreased Interest 0  Down, Depressed, Hopeless 0  PHQ - 2 Score 0    ADLs:  In your present state of health, do you have any difficulty performing the following activities: 06/17/2020 08/30/2019  Hearing? N N  Vision? N N  Difficulty concentrating or making decisions? N N  Walking or climbing stairs? N N  Dressing or bathing? N N  Doing errands, shopping? N N  Preparing Food and eating ? N -  Using the Toilet? N -  In the past six months, have you accidently leaked urine? N -  Do you have problems with loss of bowel control? N -  Managing your Medications? N -  Managing your Finances? N -  Housekeeping or managing your Housekeeping? N -  Some recent data might be hidden     Cognitive Testing  Alert? Yes  Normal Appearance?Yes  Oriented to person? Yes  Place? Yes   Time? Yes  Recall of three objects?  Yes  Can perform simple calculations? Yes  Displays appropriate judgment?Yes  Can read the correct time from a watch face?Yes  EOL planning: Does Patient Have a Medical Advance Directive?: Yes Type of Advance Directive: [place] Does patient want to make  changes to medical advance directive?: No - Patient declined Copy of [place] in Chart?: No - copy requested    Medication Review  Current Outpatient Medications (Endocrine & Metabolic):  .  alendronate (FOSAMAX) 70 MG tablet, Take 1 tablet (70 mg total) by mouth once a week.  Current Outpatient Medications (Cardiovascular):  .  rosuvastatin (CRESTOR) 5 MG tablet, Take 1 tablet Daily for Cholesterol  Current Outpatient Medications (Respiratory):  Marland Kitchen  Tiotropium Bromide-Olodaterol (STIOLTO RESPIMAT) 2.5-2.5 MCG/ACT AERS, Use     2 Inhalations      Daily  Current Outpatient Medications (Analgesics):  .  aspirin EC 81 MG tablet, Take 81 mg by mouth daily.   Current Outpatient Medications (Other):  Marland Kitchen  Coenzyme Q10 10 MG capsule,  Take 10 mg by mouth 2 (two) times daily. Marland Kitchen  gabapentin (NEURONTIN) 300 MG capsule, Take  1 capsule  3 to 4 x /day  as needed for Pain .  Magnesium 250 MG TABS, Take 250 mg by mouth daily.  Marland Kitchen  omeprazole (PRILOSEC) 40 MG capsule, TAKE 1 CAPSULE DAILY FOR INDIGESTION AND REFLUX .  potassium chloride (K-DUR,KLOR-CON) 10 MEQ tablet, Take 10-20 mEq by mouth See admin instructions. 73mq in the AM and in the PM .  vitamin C (ASCORBIC ACID) 500 MG tablet, Take 500 mg by mouth daily. .Marland Kitchen donepezil (ARICEPT) 10 MG tablet, Take 1 tablet (10 mg total) by mouth at bedtime. (Patient not taking: Reported on 06/17/2020)  Current Problems (verified) Patient Active Problem List   Diagnosis Date Noted  . Mild malnutrition ([place] 05/25/2019  . Left displaced femoral neck fracture ([facility] 01/18/2018  . Lumbar radiculopathy 01/18/2018  . Aortic atherosclerosis ([facility] 01/10/2018  . Anemia, chronic disease 12/13/2017  . Hypokalemia 05/28/2016  . Follicular lymphoma grade iiia, lymph nodes of head, face, and neck ([place] 02/11/2016  . OSA and COPD overlap syndrome ([facility] 12/05/2014  . Osteoporosis 12/05/2014  . Medication management 03/06/2014  . CKD (chronic kidney disease) stage 3, GFR 30-59 ml/min (HCC) 11/27/2013  . COPD (chronic obstructive pulmonary disease) with emphysema ([facility] 11/27/2013  . Essential hypertension   . GERD    . Hx of adenomatous colonic polyps   . Hyperlipidemia, mixed   . Abnormal glucose   . Vitamin D deficiency   . RLS (restless legs syndrome)   . Vitamin B12 deficiency   . Anxiety    Allergies No Known Allergies   Screening Tests: Immunization History  Administered Date(s) Administered  . Influenza Split 01/18/2013  . Influenza, High Dose Seasonal PF 12/05/2014, 01/23/2016, 12/22/2016, 01/11/2018, 12/22/2018  . Influenza-Unspecified 01/17/2018  . PFIZER(Purple Top)SARS-COV-2 Vaccination 05/11/2019, 06/01/2019  . Pneumococcal Conjugate-13 11/27/2013  . Pneumococcal  Polysaccharide-23 11/19/2011  . Td 08/13/2008  . Zoster 04/06/2010     Preventative care: Last colonoscopy: 05/2014 Last mammogram: 07/2019, scheduled for 2022 Last pap smear/pelvic exam: Done per GYN DEXA: Started fosamax 2016, off 2020, 04/2017 T -3.2  07/2019 (-3.5) Referral for Prolia CT chest 04/2018, stable pulmonary nodule CT neck 01/29/2016 Echo 02/24/2016  Prior vaccinations: Tdap: Due discussed with patient Influenza: 2020 Pneumonia: 2013 Prevnar: 2015 Shingles: 2012 COVID vaccine Phizer Feb 10, Feb 25th  Names of Other Physician/Practitioners you currently use: 1. [facility]- here for primary care 2. [facility], 2022 3. Dentist, 08/2020 Patient Care Team: MUnk Pinto MD as PCP - General (Internal Medicine) BLafayette Dragon MD (Inactive) as Consulting Physician (Gastroenterology) DLoletha Carrow MD (  Pulmonary Disease) Heath Lark, MD as Consulting Physician (Hematology and Oncology) Rozetta Nunnery, MD as Consulting Physician (Otolaryngology) Chales Salmon, OD as Consulting Physician (Optometry)  SURGICAL HISTORY She  has a past surgical history that includes Appendectomy; Tonsillectomy; Colonoscopy (N/A, 05/18/2014); Lymph node biopsy (Right, 02/04/2016); Direct laryngoscopy (Right, 02/04/2016); Total hip arthroplasty (Left, 10/21/2017); and Cataract extraction, bilateral (2017). FAMILY HISTORY Her family history includes Stroke in her maternal grandmother and mother. SOCIAL HISTORY She  reports that she quit smoking about 8 years ago. Her smoking use included cigarettes. She has a 30.00 pack-year smoking history. She has never used smokeless tobacco. She reports that she does not drink alcohol and does not use drugs.   Review of Systems  Constitutional: Negative.   HENT: Negative.        + Snoring, better on CPAP  Respiratory: Negative for cough, hemoptysis, sputum production,  shortness of breath (improved with CPAP, not using O2 anymore) and wheezing.   Cardiovascular: Negative.   Gastrointestinal: Negative for abdominal pain, blood in stool, constipation, diarrhea, heartburn, melena, nausea and vomiting.  Genitourinary: Negative for dysuria, flank pain, frequency, hematuria and urgency.  Musculoskeletal: Negative.  Negative for falls.  Skin: Negative.   Neurological: Positive for sensory change (bilateral legs). Negative for dizziness, tingling, tremors, speech change, focal weakness, seizures and loss of consciousness.  Psychiatric/Behavioral: Positive for memory loss. Negative for depression, hallucinations, substance abuse and suicidal ideas. The patient is not nervous/anxious and does not have insomnia.     Objective:     Blood pressure 110/68, pulse 93, temperature 97.7 F (36.5 C), weight 106 lb (48.1 kg), SpO2 93 %. Body mass index is 19.39 kg/m.  General appearance: Thin, alert, no distress, WD/WN,  female HEENT: normocephalic, sclerae anicteric, TMs pearly, nares patent, no discharge or erythema, pharynx normal Oral cavity: MMM, no lesions Neck: supple,  no thyromegaly, no masses Heart: RRR, normal S1, S2, no murmurs Lungs: CTA bilaterally, no wheezes, rhonchi, or rales Abdomen: +bs, soft, non tender, no rebound, distended, no masses, no hepatomegaly, no splenomegaly Musculoskeletal: nontender, no swelling, no obvious deformity Extremities: no edema, no cyanosis, no clubbing Pulses: 2+ symmetric, upper and lower extremities, normal cap refill Neurological: alert, oriented x 3, CN2-12 intact, strength normal upper extremities and lower extremities,DTRs 2+ throughout, no cerebellar signs, gait slow, antalgic Psychiatric: normal affect, behavior normal, pleasant     Medicare Attestation I have personally reviewed: The patient's medical and social history Their use of alcohol, tobacco or illicit drugs Their current medications and  supplements The patient's functional ability including ADLs,fall risks, home safety risks, cognitive, and hearing and visual impairment Diet and physical activities Evidence for depression or mood disorders  The patient's weight, height, BMI, and visual acuity have been recorded in the chart.  I have made referrals, counseling, and provided education to the patient based on review of the above and I have provided the patient with a written personalized care plan for preventive services.      Garnet Sierras, Laqueta Jean, DNP [facility] 06/17/2020  3:23 PM

## 2020-06-17 NOTE — Patient Instructions (Addendum)
Contact Rheumatology office for the Prolia injection, this is to increase your bone density.  Below is some general information about the medication.   Prolia Denosumab injection What is this medicine? DENOSUMAB (den oh sue mab) slows bone breakdown. Prolia is used to treat osteoporosis in women after menopause and in men, and in people who are taking corticosteroids for 6 months or more. Delton See is used to treat a high calcium level due to cancer and to prevent bone fractures and other bone problems caused by multiple myeloma or cancer bone metastases. Delton See is also used to treat giant cell tumor of the bone. This medicine may be used for other purposes; ask your health care provider or pharmacist if you have questions. COMMON BRAND NAME(S): Prolia, XGEVA What should I tell my health care provider before I take this medicine? They need to know if you have any of these conditions:  dental disease  having surgery or tooth extraction  infection  kidney disease  low levels of calcium or Vitamin D in the blood  malnutrition  on hemodialysis  skin conditions or sensitivity  thyroid or parathyroid disease  an unusual reaction to denosumab, other medicines, foods, dyes, or preservatives  pregnant or trying to get pregnant  breast-feeding How should I use this medicine? This medicine is for injection under the skin. It is given by a health care professional in a hospital or clinic setting. A special MedGuide will be given to you before each treatment. Be sure to read this information carefully each time. For Prolia, talk to your pediatrician regarding the use of this medicine in children. Special care may be needed. For Delton See, talk to your pediatrician regarding the use of this medicine in children. While this drug may be prescribed for children as young as 13 years for selected conditions, precautions do apply. Overdosage: If you think you have taken too much of this medicine contact a  poison control center or emergency room at once. NOTE: This medicine is only for you. Do not share this medicine with others. What if I miss a dose? It is important not to miss your dose. Call your doctor or health care professional if you are unable to keep an appointment. What may interact with this medicine? Do not take this medicine with any of the following medications:  other medicines containing denosumab This medicine may also interact with the following medications:  medicines that lower your chance of fighting infection  steroid medicines like prednisone or cortisone This list may not describe all possible interactions. Give your health care provider a list of all the medicines, herbs, non-prescription drugs, or dietary supplements you use. Also tell them if you smoke, drink alcohol, or use illegal drugs. Some items may interact with your medicine. What should I watch for while using this medicine? Visit your doctor or health care professional for regular checks on your progress. Your doctor or health care professional may order blood tests and other tests to see how you are doing. Call your doctor or health care professional for advice if you get a fever, chills or sore throat, or other symptoms of a cold or flu. Do not treat yourself. This drug may decrease your body's ability to fight infection. Try to avoid being around people who are sick. You should make sure you get enough calcium and vitamin D while you are taking this medicine, unless your doctor tells you not to. Discuss the foods you eat and the vitamins you take with your  health care professional. See your dentist regularly. Brush and floss your teeth as directed. Before you have any dental work done, tell your dentist you are receiving this medicine. Do not become pregnant while taking this medicine or for 5 months after stopping it. Talk with your doctor or health care professional about your birth control options while  taking this medicine. Women should inform their doctor if they wish to become pregnant or think they might be pregnant. There is a potential for serious side effects to an unborn child. Talk to your health care professional or pharmacist for more information. What side effects may I notice from receiving this medicine? Side effects that you should report to your doctor or health care professional as soon as possible:  allergic reactions like skin rash, itching or hives, swelling of the face, lips, or tongue  bone pain  breathing problems  dizziness  jaw pain, especially after dental work  redness, blistering, peeling of the skin  signs and symptoms of infection like fever or chills; cough; sore throat; pain or trouble passing urine  signs of low calcium like fast heartbeat, muscle cramps or muscle pain; pain, tingling, numbness in the hands or feet; seizures  unusual bleeding or bruising  unusually weak or tired Side effects that usually do not require medical attention (report to your doctor or health care professional if they continue or are bothersome):  constipation  diarrhea  headache  joint pain  loss of appetite  muscle pain  runny nose  tiredness  upset stomach This list may not describe all possible side effects. Call your doctor for medical advice about side effects. You may report side effects to FDA at 1-800-FDA-1088. Where should I keep my medicine? This medicine is only given in a clinic, doctor's office, or other health care setting and will not be stored at home. NOTE: This sheet is a summary. It may not cover all possible information. If you have questions about this medicine, talk to your doctor, pharmacist, or health care provider.  2021 Elsevier/Gold Standard (2017-07-30 16:10:44)   To help with the neuropathy / numbness in your legs  You may take gabapentin 350m two tablet up to three times a day.  Be sure you know how this medication affects  you before you drive.  Wee have provided a sample of Breztri for you to use.e  Use this twice a day.  Be sure to rinse your mouth after each use.  Let uKoreaknow if this improves your breathing.  Use this in place of the Stiolto.  Increase the amount of vegetables and whole grains in your diet as well as water intake.  This can help reduce constipation. Metamucil is a fiber supplement that can also help with constipation, diarrhea and with your cholesterol.  This is not a stimulant, it adds bulk to your bowel movements.   GENERAL HEALTH GOALS  Know what a healthy weight is for you (roughly BMI <25) and aim to maintain this  Aim for 7+ servings of fruits and vegetables daily  70-80+ fluid ounces of water or unsweet tea for healthy kidneys  Limit to max 1 drink of alcohol per day; avoid smoking/tobacco  Limit animal fats in diet for cholesterol and heart health - choose grass fed whenever available  Avoid highly processed foods, and foods high in saturated/trans fats  Aim for low stress - take time to unwind and care for your mental health  Aim for 150 min of moderate intensity exercise  weekly for heart health, and weights twice weekly for bone health  Aim for 7-9 hours of sleep daily

## 2020-06-18 LAB — COMPLETE METABOLIC PANEL WITH GFR
AG Ratio: 1.8 (calc) (ref 1.0–2.5)
ALT: 12 U/L (ref 6–29)
AST: 18 U/L (ref 10–35)
Albumin: 4.3 g/dL (ref 3.6–5.1)
Alkaline phosphatase (APISO): 88 U/L (ref 37–153)
BUN: 14 mg/dL (ref 7–25)
CO2: 34 mmol/L — ABNORMAL HIGH (ref 20–32)
Calcium: 9.1 mg/dL (ref 8.6–10.4)
Chloride: 103 mmol/L (ref 98–110)
Creat: 0.81 mg/dL (ref 0.60–0.93)
GFR, Est African American: 81 mL/min/{1.73_m2} (ref >=60)
GFR, Est Non African American: 70 mL/min/{1.73_m2} (ref >=60)
Globulin: 2.4 g/dL (calc) (ref 1.9–3.7)
Glucose, Bld: 79 mg/dL (ref 65–99)
Potassium: 3.9 mmol/L (ref 3.5–5.3)
Sodium: 146 mmol/L (ref 135–146)
Total Bilirubin: 0.6 mg/dL (ref 0.2–1.2)
Total Protein: 6.7 g/dL (ref 6.1–8.1)

## 2020-06-18 LAB — CBC WITH DIFFERENTIAL/PLATELET
Absolute Monocytes: 456 cells/uL (ref 200–950)
Basophils Absolute: 42 cells/uL (ref 0–200)
Basophils Relative: 0.7 %
Eosinophils Absolute: 108 cells/uL (ref 15–500)
Eosinophils Relative: 1.8 %
HCT: 41.7 % (ref 35.0–45.0)
Hemoglobin: 14.3 g/dL (ref 11.7–15.5)
Lymphs Abs: 1110 cells/uL (ref 850–3900)
MCH: 32.9 pg (ref 27.0–33.0)
MCHC: 34.3 g/dL (ref 32.0–36.0)
MCV: 95.9 fL (ref 80.0–100.0)
MPV: 11 fL (ref 7.5–12.5)
Monocytes Relative: 7.6 %
Neutro Abs: 4284 cells/uL (ref 1500–7800)
Neutrophils Relative %: 71.4 %
Platelets: 154 10*3/uL (ref 140–400)
RBC: 4.35 10*6/uL (ref 3.80–5.10)
RDW: 13.2 % (ref 11.0–15.0)
Total Lymphocyte: 18.5 %
WBC: 6 10*3/uL (ref 3.8–10.8)

## 2020-06-18 LAB — LIPID PANEL
Cholesterol: 167 mg/dL (ref <200)
HDL: 69 mg/dL (ref >=50)
LDL Cholesterol (Calc): 74 mg/dL (calc)
Non-HDL Cholesterol (Calc): 98 mg/dL (calc) (ref <130)
Total CHOL/HDL Ratio: 2.4 (calc) (ref <5.0)
Triglycerides: 159 mg/dL — ABNORMAL HIGH (ref <150)

## 2020-08-23 ENCOUNTER — Telehealth: Payer: Self-pay | Admitting: Internal Medicine

## 2020-08-23 NOTE — Telephone Encounter (Signed)
Per Dr. Unk Pinto, ok to take 1 Gabapentin 300mg  at dinner and 1 at bedtime for leg pain and foot cramping.

## 2020-08-28 ENCOUNTER — Ambulatory Visit (INDEPENDENT_AMBULATORY_CARE_PROVIDER_SITE_OTHER): Payer: Medicare HMO | Admitting: Internal Medicine

## 2020-08-28 ENCOUNTER — Other Ambulatory Visit: Payer: Self-pay

## 2020-08-28 VITALS — BP 134/74 | HR 77 | Temp 97.5°F | Resp 17 | Ht 62.0 in | Wt 102.0 lb

## 2020-08-28 DIAGNOSIS — I10 Essential (primary) hypertension: Secondary | ICD-10-CM | POA: Diagnosis not present

## 2020-08-28 DIAGNOSIS — G459 Transient cerebral ischemic attack, unspecified: Secondary | ICD-10-CM | POA: Diagnosis not present

## 2020-08-28 DIAGNOSIS — Z79899 Other long term (current) drug therapy: Secondary | ICD-10-CM

## 2020-08-28 LAB — COMPLETE METABOLIC PANEL WITH GFR
AG Ratio: 1.9 (calc) (ref 1.0–2.5)
ALT: 9 U/L (ref 6–29)
AST: 17 U/L (ref 10–35)
Albumin: 4.7 g/dL (ref 3.6–5.1)
Alkaline phosphatase (APISO): 66 U/L (ref 37–153)
BUN: 20 mg/dL (ref 7–25)
CO2: 36 mmol/L — ABNORMAL HIGH (ref 20–32)
Calcium: 9.7 mg/dL (ref 8.6–10.4)
Chloride: 99 mmol/L (ref 98–110)
Creat: 0.78 mg/dL (ref 0.60–0.93)
GFR, Est African American: 84 mL/min/{1.73_m2} (ref >=60)
GFR, Est Non African American: 73 mL/min/{1.73_m2} (ref >=60)
Globulin: 2.5 g/dL (calc) (ref 1.9–3.7)
Glucose, Bld: 90 mg/dL (ref 65–99)
Potassium: 3.7 mmol/L (ref 3.5–5.3)
Sodium: 145 mmol/L (ref 135–146)
Total Bilirubin: 0.6 mg/dL (ref 0.2–1.2)
Total Protein: 7.2 g/dL (ref 6.1–8.1)

## 2020-08-28 LAB — CBC WITH DIFFERENTIAL/PLATELET
Absolute Monocytes: 426 cells/uL (ref 200–950)
Basophils Absolute: 28 cells/uL (ref 0–200)
Basophils Relative: 0.4 %
Eosinophils Absolute: 43 cells/uL (ref 15–500)
Eosinophils Relative: 0.6 %
HCT: 44.9 % (ref 35.0–45.0)
Hemoglobin: 15.1 g/dL (ref 11.7–15.5)
Lymphs Abs: 973 cells/uL (ref 850–3900)
MCH: 32.8 pg (ref 27.0–33.0)
MCHC: 33.6 g/dL (ref 32.0–36.0)
MCV: 97.4 fL (ref 80.0–100.0)
MPV: 11.4 fL (ref 7.5–12.5)
Monocytes Relative: 6 %
Neutro Abs: 5630 cells/uL (ref 1500–7800)
Neutrophils Relative %: 79.3 %
Platelets: 171 10*3/uL (ref 140–400)
RBC: 4.61 10*6/uL (ref 3.80–5.10)
RDW: 12.6 % (ref 11.0–15.0)
Total Lymphocyte: 13.7 %
WBC: 7.1 10*3/uL (ref 3.8–10.8)

## 2020-08-28 LAB — MAGNESIUM: Magnesium: 1.7 mg/dL (ref 1.5–2.5)

## 2020-08-28 NOTE — Progress Notes (Signed)
Future Appointments  Date Time Provider [place]  08/28/2020 11:00 AM Unk Pinto, MD [facility] None  10/22/2020  2:00 PM Unk Pinto, MD [facility] None  02/06/2021 11:00 AM Heath Lark, MD [facility] None  06/17/2021  2:30 PM Garnet Sierras, NP [facility] None    History of Present Illness:       This very nice 80 y.o. WWF presents for follow up with  HTN, HLD, COPD, OSA/CPAP Overlap,  Prediabetes, Vitamin D Deficiency & patient is followed by Dr Alvy Bimler for Stage IIIa Follicular Lymphoma in remission.  Patient apparently had an episode od confusion & unsteady gait lasting about an hour approx 10 days previously and has done well since.        Patient is treated for HTN  (1985) & BP has been controlled at home. Today's BP was initially slightly elevated & rechecked at goal -  134/74. Patient has had no complaints of any cardiac type chest pain, palpitations, dyspnea / orthopnea / PND, dizziness, claudication, or dependent edema.       Hyperlipidemia is controlled with diet & meds. Patient denies myalgias or other med SE's. Last Lipids were near goal:  Lab Results  Component Value Date   CHOL 167 06/17/2020   HDL 69 06/17/2020   LDLCALC 74 06/17/2020   TRIG 159 (H) 06/17/2020   CHOLHDL 2.4 06/17/2020     Also, the patient is monitored for glucose intolerance and has had no symptoms of reactive hypoglycemia, diabetic polys, paresthesias or visual blurring.  Last A1c was normal & at goal:  Lab Results  Component Value Date   HGBA1C 5.1 12/04/2019        Further, the patient also has history of Vitamin D Deficiency ("44" /2008) and supplements vitamin D without any suspected side-effects. Last vitamin D was near goal (70-100):   Lab Results  Component Value Date   VD25OH 50 12/04/2019     Current Outpatient Medications on File Prior to Visit  Medication Sig  . Coenzyme Q10 10 MG  Take 2  times daily.  . Gabapentin  300 MG  Take  2 capsules  up to  3x /day as needed for Pain  . Magnesium 250 MG  Take  daily.   Marland Kitchen omeprazole40 MG  TAKE 1 CAPSULE DAILY   . potassium chloride  10 MEQ  25mEq in the AM and in the PM  . rosuvastatin  5 MG  Take 1 tablet Daily for Cholesterol  . STIOLTO   RESPIMAT 2.5-2.5  Use     2 Inhalations      Daily  . vitamin C  500 MG  Take 500 mg by mouth daily.  Marland Kitchen aspirin EC 81 MG  Take  daily. (Patient not taking: Reported on 08/28/2020)    No Known Allergies   PMHx:   Past Medical History:  Diagnosis Date  . Anxiety   . COPD (chronic obstructive pulmonary disease) ([place])   . Diverticulosis   . Diverticulosis of colon with hemorrhage   . GERD (gastroesophageal reflux disease)   . H/O total knee replacement, left 11/05/2017  . Hx of adenomatous colonic polyps   . Hyperlipidemia   . Hypertension   . Hypomagnesemia   . Neuropathy   . Peripheral neuropathy   . Prediabetes   . RLS (restless legs syndrome)   . Sleep apnea    CPAP  . Vitamin B12 deficiency   . Vitamin D deficiency  Immunization History  Administered Date(s) Administered  . Influenza Split 01/18/2013  . Influenza, High Dose Seasonal PF 12/05/2014, 01/23/2016, 12/22/2016, 01/11/2018, 12/22/2018  . Influenza-Unspecified 01/17/2018  . PFIZER(Purple Top)SARS-COV-2 Vaccination 05/11/2019, 06/01/2019  . Pneumococcal Conjugate-13 11/27/2013  . Pneumococcal Polysaccharide-23 11/19/2011  . Td 08/13/2008  . Zoster 04/06/2010    Past Surgical History:  Procedure Laterality Date  . APPENDECTOMY    . CATARACT EXTRACTION, BILATERAL  2017   Bilateral cataract Dr. Bing Plume  . COLONOSCOPY N/A 05/18/2014   Procedure: COLONOSCOPY;  Surgeon: Lafayette Dragon, MD;  Location: [facility];  Service: Endoscopy;  Laterality: N/A;  . DIRECT LARYNGOSCOPY Right 02/04/2016   Procedure: DIRECT LARYNGOSCOPY;  Surgeon: Rozetta Nunnery, MD;  Location: [facility];  Service: ENT;  Laterality: Right;  . LYMPH NODE BIOPSY Right 02/04/2016    Procedure: EXCISIONAL BIOPSY RIGHT NECK LYMPH NODE;  Surgeon: Rozetta Nunnery, MD;  Location: [facility];  Service: ENT;  Laterality: Right;  . TONSILLECTOMY    . TOTAL HIP ARTHROPLASTY Left 10/21/2017   Procedure: TOTAL HIP ARTHROPLASTY ANTERIOR APPROACH;  Surgeon: Leandrew Koyanagi, MD;  Location: [place] [facility];  Service: Orthopedics;  Laterality: Left;    FHx:    Reviewed / unchanged  SHx:    Reviewed / unchanged   Systems Review:  Constitutional: Denies fever, chills, wt changes, headaches, insomnia, fatigue, night sweats, change in appetite. Eyes: Denies redness, blurred vision, diplopia, discharge, itchy, watery eyes.  ENT: Denies discharge, congestion, post nasal drip, epistaxis, sore throat, earache, hearing loss, dental pain, tinnitus, vertigo, sinus pain, snoring.  CV: Denies chest pain, palpitations, irregular heartbeat, syncope, dyspnea, diaphoresis, orthopnea, PND, claudication or edema. Respiratory: denies cough, dyspnea, DOE, pleurisy, hoarseness, laryngitis, wheezing.  Gastrointestinal: Denies dysphagia, odynophagia, heartburn, reflux, water brash, abdominal pain or cramps, nausea, vomiting, bloating, diarrhea, constipation, hematemesis, melena, hematochezia  or hemorrhoids. Genitourinary: Denies dysuria, frequency, urgency, nocturia, hesitancy, discharge, hematuria or flank pain. Musculoskeletal: Denies arthralgias, myalgias, stiffness, jt. swelling, pain, limping or strain/sprain.  Skin: Denies pruritus, rash, hives, warts, acne, eczema or change in skin lesion(s). Neuro: No weakness, tremor, incoordination, spasms, paresthesia or pain. Psychiatric: Denies confusion, memory loss or sensory loss. Endo: Denies change in weight, skin or hair change.  Heme/Lymph: No excessive bleeding, bruising or enlarged lymph nodes.  Physical Exam  BP 134/74   Pulse 77   Temp (!) 97.5 F (36.4 C)   Resp 17   Ht 5\' 2"  (1.575 m)   Wt 102 lb (46.3 kg)   SpO2 95%   BMI 18.66  kg/m   Appears  well nourished, well groomed  and in no distress.  Eyes: PERRLA, EOMs, conjunctiva no swelling or erythema. Sinuses: No frontal/maxillary tenderness ENT/Mouth: EAC's clear, TM's nl w/o erythema, bulging. Nares clear w/o erythema, swelling, exudates. Oropharynx clear without erythema or exudates. Oral hygiene is good. Tongue normal, non obstructing. Hearing intact.  Neck: Supple. Thyroid not palpable. Car 2+/2+ without bruits, nodes or JVD. Chest: Respirations nl with BS clear & equal w/o rales, rhonchi, wheezing or stridor.  Cor: Heart sounds normal w/ regular rate and rhythm without sig. murmurs, gallops, clicks or rubs. Peripheral pulses normal and equal  without edema.  Abdomen: Soft & bowel sounds normal. Non-tender w/o guarding, rebound, hernias, masses or organomegaly.  Lymphatics: Unremarkable.  Musculoskeletal: Full ROM all peripheral extremities, joint stability, 5/5 strength and normal gait.  Skin: Warm, dry without exposed rashes, lesions or ecchymosis apparent.  Neuro: Cranial nerves intact, reflexes equal bilaterally.  Sensory-motor testing grossly intact. Tendon reflexes grossly intact.  Pysch: Alert & oriented x 3.  Insight and judgement nl & appropriate. No ideations.  Assessment and Plan:   1. Essential hypertension  - Continue medication, monitor blood pressure at home.  - Continue DASH diet.  Reminder to go to the ER if any CP,  SOB, nausea, dizziness, severe HA, changes vision/speech.  - COMPLETE METABOLIC PANEL WITH GFR - CBC with Differential/Platelet - Magnesium  2. TIA (transient ischemic attack)  - COMPLETE METABOLIC PANEL WITH GFR  - advised that she re-start her LD bASA 81 mg Daily   3. Medication management  - COMPLETE METABOLIC PANEL WITH GFR - CBC with Differential/Platelet - Magnesium         Discussed  regular exercise, BP monitoring, weight control to achieve/maintain BMI less than 25 and discussed med and SE's. Recommended  labs to assess and monitor clinical status with further disposition pending results of labs.  I discussed the assessment and treatment plan with the patient. The patient was provided an opportunity to ask questions and all were answered. The patient agreed with the plan and demonstrated an understanding of the instructions.  I provided over 30 minutes of exam, counseling, chart review and  complex critical decision making.         The patient was advised to call back or seek an in-person evaluation if the symptoms worsen or if the condition fails to improve as anticipated.   Kirtland Bouchard, MD

## 2020-08-28 NOTE — Patient Instructions (Signed)
Transient Ischemic Attack A transient ischemic attack (TIA) causes the same symptoms as a stroke, but the symptoms go away quickly. A TIA happens when blood flow to the brain is blocked. Having a TIA means you may be at risk for a stroke. A TIA is a medical emergency. What are the causes? A TIA is caused by a blocked artery in the head or neck. This means the brain does not get the blood supply it needs. A blockage can be caused by:  Fatty buildup in an artery in the head or neck.  A blood clot.  A tear in an artery.  Irritation and swelling (inflammation) of an artery. Sometimes the cause is not known. What increases the risk? Certain things may make you more likely to have a TIA. Some of these are things that you can change, such as:  Being very overweight.  Using products that have nicotine or tobacco.  Taking birth control pills.  Not being active.  Drinking too much alcohol.  Using drugs. Health conditions that may increase your risk include:  High blood pressure.  High cholesterol.  Diabetes.  Heart disease.  A heartbeat that is not regular (atrial fibrillation).  Sickle cell disease.  Sleep problems (sleep apnea).  Long-term diseases that cause irritation and swelling.  Problems with blood clotting. Other risk factors include:  Being over the age of 19.  Being female.  Having a family history of stroke.  Having had blood clots, stroke, TIA, or heart attack in the past.  Having a history of high blood pressure when pregnant (preeclampsia).  Very bad headaches (migraines). What are the signs or symptoms? The symptoms of a TIA are like those of a stroke. They can include:  Weakness or loss of feeling in your face, arm, or leg. This often happens on one side of your body.  Trouble walking.  Trouble moving your arms or legs.  Trouble talking or understanding what people are saying.  Problems with how you see.  Feeling dizzy.  Feeling  confused.  Loss of balance or coordination.  Feeling like you may vomit (nausea) or you vomit.  Having a very bad headache. If you can, note what time you started to have symptoms. Tell your doctor.   How is this treated? The goal of treatment is to lower the risk for a stroke. This may include:  Changes to diet and lifestyle, such as getting regular exercise and stopping smoking.  Taking medicines to: ? Thin the blood. ? Lower blood pressure. ? Lower cholesterol.  Treating other health conditions, such as diabetes. If testing shows that an artery in your brain is narrow, your doctor may recommend a procedure to:  Take the blockage out of your artery (carotid endarterectomy).  Open or widen an artery in your neck (carotid angioplasty and stenting). Follow these instructions at home: Medicines  Take over-the-counter and prescription medicines only as told by your doctor.  If you were told to take aspirin or another medicine to thin your blood, take it exactly as told by your doctor. ? Taking too much of the medicine can cause bleeding. ? Taking too little of the medicine may not work to treat the problem. Eating and drinking  Eat 5 or more servings of fruits and vegetables each day.  Follow instructions from your doctor about your diet. You may need to follow a certain diet to help lower your risk of a stroke. You may need to: ? Eat a diet that is low  in fat and salt. ? Eat foods with a lot of fiber. ? Limit carbohydrates and sugar.  If you drink alcohol: ? Limit how much you have to:  0-1 drink a day for women who are not pregnant.  0-2 drinks a day for men. ? Know how much alcohol is in a drink. In the [place] one drink equals one 12 oz bottle of beer (333mL), one 5 oz glass of wine (164mL), or one 1 oz glass of hard liquor (64mL).   General instructions  Keep a healthy weight.  Try to get at least 30 minutes of exercise on most days.  Get treatment if you have  sleep problems.  Do not smoke or use any products that contain nicotine or tobacco. If you need help quitting, ask your doctor.  Do not use drugs.  Keep all follow-up visits. Where to find more information  American Stroke Association: www.stroke.org Get help right away if:  You have chest pain.  You have a heartbeat that is not regular.  You have any signs of a stroke. "BE FAST" is an easy way to remember the main warning signs: ? B - Balance. Dizziness, sudden trouble walking, or loss of balance. ? E - Eyes. Trouble seeing or a change in how you see. ? F - Face. Sudden weakness or loss of feeling of the face. The face or eyelid may droop on one side. ? A - Arms. Weakness or loss of feeling in an arm. This happens all of a sudden and most often on one side of the body. ? S - Speech. Sudden trouble speaking, slurred speech, or trouble understanding what people say. ? T - Time. Time to call emergency services. Write down what time symptoms started.  You have other signs of a stroke, such as: ? A sudden, very bad headache with no known cause. ? Feeling like you may vomit. ? Vomiting. ? A seizure. These symptoms may be an emergency. Get help right away. Call your local emergency services (911 in the [place]  Do not wait to see if the symptoms will go away.  Do not drive yourself to the hospital. Summary  A transient ischemic attack (TIA) happens when an artery in the head or neck is blocked. This causes the same symptoms as a stroke. The symptoms go away quickly.  A TIA is a medical emergency. Get help right away, even if your symptoms go away.  Having a TIA means that you may be at risk for a stroke. Taking medicines and making diet and lifestyle changes can help to prevent a stroke. This information is not intended to replace advice given to you by your health care provider. Make sure you discuss any questions you have with your health care provider. Document Revised:  10/17/2019 Document Reviewed: 10/17/2019 Elsevier Patient Education  [place].

## 2020-08-29 NOTE — Progress Notes (Signed)
============================================================ -   Test results slightly outside the reference range are not unusual. If there is anything important, I will review this with you,  otherwise it is considered normal test values.  If you have further questions,  please do not hesitate to contact me at the office or via My Chart.  ============================================================ ============================================================  -  All Else - CBC - Kidneys - Electrolytes - Liver &  Magnesium                                                                                           - all  Normal / OK   ============================================================ ============================================================

## 2020-09-02 ENCOUNTER — Encounter: Payer: Self-pay | Admitting: Internal Medicine

## 2020-09-03 ENCOUNTER — Encounter: Payer: Medicare HMO | Admitting: Internal Medicine

## 2020-09-03 ENCOUNTER — Other Ambulatory Visit: Payer: Self-pay | Admitting: Adult Health Nurse Practitioner

## 2020-09-03 DIAGNOSIS — E782 Mixed hyperlipidemia: Secondary | ICD-10-CM

## 2020-09-12 ENCOUNTER — Telehealth: Payer: Self-pay | Admitting: Internal Medicine

## 2020-09-12 NOTE — Telephone Encounter (Signed)
Patients caregiver neighbor,  Renee Hawkins, called with patient on the line. Patient having SOB, tried using an old small "[employer] oxygen tank, but it's not working. They took the device to [facility], repairs would cost several thousand dollars. Patient needs rx sent to [facility].  Advised patient this tank has not been certified in many years and never by our office. I offered [facility] office number, patient declined. She did not want to pursue any other treatment measures at this time, states she will use her CPAP during the day if needed.   She prefers Dr. Melford Aase evaluate her at next office visit on the June 20th to establish necessity via Walk Test or recommend other treatment options.

## 2020-09-22 NOTE — Progress Notes (Signed)
Future Appointments  Date Time Provider [place]  09/23/2020  3:30 PM Unk Pinto, MD [facility] None  10/22/2020  2:00 PM Unk Pinto, MD [facility] None  02/06/2021 11:00 AM Heath Lark, MD [facility] None  06/17/2021  2:30 PM Garnet Sierras, NP [facility] None    History of Present Illness:      Patient is a very nice 80 yo WWF with hx/o COPD & 30+  hx/o  smoking 1-1.5 PPD who quit smoking in in 2014.  Chest CT in 2020 showed prominent emphysema.       This patient also is followed with HTN, HLD, COPD, OSA/CPAP Overlap,  Prediabetes, Vitamin D Deficiency & patient is followed by Dr Alvy Bimler for Stage IIIa Follicular Lymphoma in remission.        In July 2016, patient was started on Auto CPAP with nocturnal Oxygen by Dr Welford Roche, [facility]. (Notes in media). Patient reports recently her Oxygen machine  (?Concentrator) has broken & her DME supplier has advised her $2,000-3,000 to repair /Replace.  She is over due for retesting.      SATURATION QUALIFICATIONS:   Patient Saturations on Room Air at Rest =  96  %   Patient Saturations on Room Air while Ambulating 100 '  =  90    %  Medications    rosuvastatin (CRESTOR) 5 MG tablet, TAKE 1 TABLET DAILY FOR CHOLESTEROL    Tiotropium Bromide-Olodaterol (STIOLTO RESPIMAT) 2.5-2.5 MCG/ACT AERS, Use     2 Inhalations      Daily    aspirin EC 81 MG tablet, Take 81 mg by mouth daily.   Coenzyme Q10 10 MG capsule, Take 10 mg by mouth 2 (two) times daily.   gabapentin (NEURONTIN) 300 MG capsule, Take  2 capsules up to  3x /day as needed for Pain   Magnesium 250 MG TABS, Take 250 mg by mouth daily.    omeprazole  40 MG capsule, TAKE 1 CAPSULE DAILY FOR INDIGESTION AND REFLUX   potassium chloride (K-DUR,KLOR-CON) 10 MEQ tablet, Take 10-20 mEq by mouth See admin instructions. 62mEq in the AM and in the PM   vitamin C (ASCORBIC ACID) 500 MG tablet, Take 500 mg by mouth daily.  Problem list She has Essential  hypertension; GERD ; Hx of adenomatous colonic polyps; Hyperlipidemia, mixed; Abnormal glucose; Vitamin D deficiency; RLS (restless legs syndrome); Vitamin B12 deficiency; Anxiety; CKD (chronic kidney disease) stage 3, GFR 30-59 ml/min (HCC); COPD (chronic obstructive pulmonary disease) with emphysema ([place]); Medication management; OSA and COPD overlap syndrome ([place]); Osteoporosis; Follicular lymphoma grade iiia, lymph nodes of head, face, and neck ([place]); Hypokalemia; Anemia, chronic disease; Aortic atherosclerosis ([place]) by Chest CTscan on 05/05/2018; Left displaced femoral neck fracture ([place]); Lumbar radiculopathy; Mild malnutrition ([place]); and Nocturnal hypoxemia due to obstructive chronic bronchitis (HCC) on their problem list.   Observations/Objective:  BP (!) 142/76   Pulse 86   Temp (!) 97.5 F (36.4 C)   Resp 17   Ht 5\' 2"  (1.575 m)   Wt 104 lb 9.6 oz (47.4 kg)   SpO2 98%   BMI 19.13 kg/m   HEENT - WNL. Mallampati  III. Neck - supple.  Chest - Clear equal BS. Cor - Nl HS. RRR w/o sig MGR. PP 1(+). No edema. MS- FROM w/o deformities.  Gait Nl. Neuro -  Nl w/o focal abnormalities.  Assessment and Plan:  1. COPD (chronic obstructive pulmonary disease) with chronic bronchitis ([place])   2. OSA (obstructive sleep apnea)  -  Refer for OSA reassessment  3. Nocturnal hypoxemia due to obstructive chronic bronchitis (HCC)   Follow Up Instructions:       I discussed the assessment and treatment plan with the patient. The patient was provided an opportunity to ask questions and all were answered. The patient agreed with the plan and demonstrated an understanding of the instructions.    Kirtland Bouchard, MD

## 2020-09-23 ENCOUNTER — Other Ambulatory Visit: Payer: Self-pay

## 2020-09-23 ENCOUNTER — Encounter: Payer: Self-pay | Admitting: Internal Medicine

## 2020-09-23 ENCOUNTER — Ambulatory Visit (INDEPENDENT_AMBULATORY_CARE_PROVIDER_SITE_OTHER): Payer: Medicare HMO | Admitting: Internal Medicine

## 2020-09-23 VITALS — BP 142/76 | HR 86 | Temp 97.5°F | Resp 17 | Ht 62.0 in | Wt 104.6 lb

## 2020-09-23 DIAGNOSIS — G4736 Sleep related hypoventilation in conditions classified elsewhere: Secondary | ICD-10-CM | POA: Insufficient documentation

## 2020-09-23 DIAGNOSIS — G4733 Obstructive sleep apnea (adult) (pediatric): Secondary | ICD-10-CM | POA: Insufficient documentation

## 2020-09-23 DIAGNOSIS — J449 Chronic obstructive pulmonary disease, unspecified: Secondary | ICD-10-CM | POA: Diagnosis not present

## 2020-10-01 ENCOUNTER — Telehealth: Payer: Self-pay | Admitting: Internal Medicine

## 2020-10-01 NOTE — Chronic Care Management (AMB) (Signed)
  Chronic Care Management   Outreach Note  10/01/2020 Name: Renee Hawkins MRN: [id number] DOB: 27-Oct-1940  Referred by: Unk Pinto, MD Reason for referral : No chief complaint on file.   An unsuccessful telephone outreach was attempted today. The patient was referred to the pharmacist for assistance with care management and care coordination.   Follow Up Plan:   Tatjana Dellinger Upstream Scheduler

## 2020-10-05 ENCOUNTER — Other Ambulatory Visit: Payer: Self-pay | Admitting: Internal Medicine

## 2020-10-08 ENCOUNTER — Telehealth: Payer: Self-pay | Admitting: Internal Medicine

## 2020-10-08 NOTE — Progress Notes (Signed)
  Chronic Care Management   Outreach Note  10/08/2020 Name: Renee Hawkins MRN: [id number] DOB: 02-04-1941  Referred by: Unk Pinto, MD Reason for referral : No chief complaint on file.    Chronic Care Management   Outreach Note  10/08/2020 Name: Renee Hawkins MRN: [id number] DOB: June 16, 1940  Referred by: Unk Pinto, MD Reason for referral : No chief complaint on file.   A second unsuccessful telephone outreach was attempted today. The patient was referred to pharmacist for assistance with care management and care coordination.   Follow Up Plan:   Tatjana Dellinger Upstream Scheduler

## 2020-10-12 ENCOUNTER — Other Ambulatory Visit: Payer: Self-pay | Admitting: Internal Medicine

## 2020-10-12 DIAGNOSIS — G8929 Other chronic pain: Secondary | ICD-10-CM

## 2020-10-21 ENCOUNTER — Encounter: Payer: Self-pay | Admitting: Internal Medicine

## 2020-10-21 NOTE — Patient Instructions (Signed)

## 2020-10-21 NOTE — Progress Notes (Addendum)
Annual Screening/Preventative Visit & Comprehensive Evaluation &  Examination  Future Appointments  Date Time Provider [place]  10/22/2020  2:00 PM Unk Pinto, MD [facility] None  02/06/2021 11:00 AM Heath Lark, MD [facility] None  06/17/2021  - Wellness  2:30 PM Garnet Sierras, NP [facility] None  10/22/2021  - CPE  2:00 PM Unk Pinto, MD [facility] None        This very nice 81 y.o.  WWF presents for a Screening /Preventative Visit & comprehensive evaluation and management of multiple medical co-morbidities.  Patient has been followed for HTN, HLD, Prediabetes  and Vitamin D Deficiency.  Patient has hx/o COPD & OSA/CPAP  Overlap. Patient is brought in  today accompanied by a neighbor & friend, both expressing concerns re: short term memory recall       Patient is also followed by Dr Heath Lark for hx/o Follicular Lymphoma of the Head & Neck (in remission) (C82.31).          HTN predates circa 1985. Patient's BP has been controlled at home and patient denies any cardiac symptoms as chest pain, palpitations, shortness of breath, dizziness or ankle swelling. Today's BP  is at goal - 128/64.        Patient's hyperlipidemia is controlled with diet and Rosuvastatin. Patient denies myalgias or other medication SE's. Last lipids were at goal:  Lab Results  Component Value Date   CHOL 167 06/17/2020   HDL 69 06/17/2020   LDLCALC 74 06/17/2020   TRIG 159 (H) 06/17/2020   CHOLHDL 2.4 06/17/2020         Patient  is monitored for glucose intolerance and patient denies reactive hypoglycemic symptoms, visual blurring, diabetic polys or paresthesias. Last A1c was normal & at goal:  Lab Results  Component Value Date   HGBA1C 5.1 12/04/2019        Finally, patient has history of Vitamin D Deficiency and last Vitamin D was at goal:  Lab Results  Component Value Date   VD25OH 84 12/04/2019    Current Outpatient Medications on File Prior to Visit  Medication Sig    aspirin EC 81 MG Take  daily.   Coenzyme Q10 10 MG  Take 1 tab 2 x /times daily.   gabapentin 300 MG  TAKE 1 CAP 3 - 4 x/DAILY AS NEEDED FOR PAIN   Magnesium 250 MG  Take  daily.    omeprazole 40 MG  TAKE 1 CAPSULE DAILY    K-DUR 10 MEQ  Take 11m Eq in the AM and in the PM   rosuvastatin 5 MG  TAKE 1 TABLET DAILY FOR CHOLESTEROL   STIOLTO RESPIMAT 2.5-2.5  INHALE 2 PUFFS EVERY DAY   vitamin C 500 MG  Take  daily.    No Known Allergies   Past Medical History:  Diagnosis Date   Anxiety    COPD (chronic obstructive pulmonary disease) (HCC)    Diverticulosis    Diverticulosis of colon with hemorrhage    GERD (gastroesophageal reflux disease)    H/O total knee replacement, left 11/05/2017   Hx of adenomatous colonic polyps    Hyperlipidemia    Hypertension    Hypomagnesemia    Neuropathy    Peripheral neuropathy    Prediabetes    RLS (restless legs syndrome)    Sleep apnea    CPAP   Vitamin B12 deficiency    Vitamin D deficiency      Health Maintenance  Topic Date Due   Hepatitis C  Screening  Never done   Zoster Vaccines- Shingrix (1 of 2) Never done   TETANUS/TDAP  08/14/2018   COVID-19 Vaccine (3 - Pfizer risk series) 06/29/2019   INFLUENZA VACCINE  11/04/2020   DEXA SCAN  Completed   PNA vac Low Risk Adult  Completed   HPV VACCINES  Aged Out     Immunization History  Administered Date(s) Administered   Influenza Split 01/18/2013   Influenza, High Dose  12/05/2014, 01/23/2016, 12/22/2016, 01/11/2018, 12/22/2018   Influenza-Unspecified 01/17/2018   PFIZER  SARS-COV-2 Vacc 05/11/2019, 06/01/2019   Pneumococcal -13 11/27/2013   Pneumococcal -23 11/19/2011   Td 08/13/2008   Zoster, Live 04/06/2010     Last Colon - 05/18/2014 - Dr Delfin Edis - recc 5 yr f/u - overdue Feb 2021 -  - received recall letter 02/.01/2020 from Dr Gerrit Heck recc an [place] - 04/30/20221 - Overdue & patient aware.   Past Surgical History:  Procedure Laterality Date    APPENDECTOMY     CATARACT EXTRACTION, BILATERAL  2017   Bilateral cataract Dr. Bing Plume   COLONOSCOPY N/A 05/18/2014   Procedure: COLONOSCOPY;  Surgeon: Lafayette Dragon, MD;  Location: [facility];  Service: Endoscopy;  Laterality: N/A;   DIRECT LARYNGOSCOPY Right 02/04/2016   Procedure: DIRECT LARYNGOSCOPY;  Surgeon: Rozetta Nunnery, MD;  Location: [facility];  Service: ENT;  Laterality: Right;   LYMPH NODE BIOPSY Right 02/04/2016   Procedure: EXCISIONAL BIOPSY RIGHT NECK LYMPH NODE;  Surgeon: Rozetta Nunnery, MD;  Location: [facility];  Service: ENT;  Laterality: Right;   TONSILLECTOMY     TOTAL HIP ARTHROPLASTY Left 10/21/2017   Procedure: TOTAL HIP ARTHROPLASTY ANTERIOR APPROACH;  Surgeon: Leandrew Koyanagi, MD;  Location: [facility];  Service: Orthopedics;  Laterality: Left;     Family History  Problem Relation Age of Onset   Stroke Mother    Stroke Maternal Grandmother    Colon cancer Neg Hx      Social History   Tobacco Use   Smoking status: Former    Packs/day: 1.00    Years: 30.00    Pack years: 30.00    Types: Cigarettes    Quit date: 04/06/2012    Years since quitting: 8.5   Smokeless tobacco: Never  Substance Use Topics   Alcohol use: No   Drug use: No      ROS Constitutional: Denies fever, chills, weight loss/gain, headaches, insomnia,  night sweats, and change in appetite. Does c/o fatigue. Eyes: Denies redness, blurred vision, diplopia, discharge, itchy, watery eyes.  ENT: Denies discharge, congestion, post nasal drip, epistaxis, sore throat, earache, hearing loss, dental pain, Tinnitus, Vertigo, Sinus pain, snoring.  Cardio: Denies chest pain, palpitations, irregular heartbeat, syncope, dyspnea, diaphoresis, orthopnea, PND, claudication, edema Respiratory: denies cough, dyspnea, DOE, pleurisy, hoarseness, laryngitis, wheezing.  Gastrointestinal: Denies dysphagia, heartburn, reflux, water brash, pain, cramps, nausea, vomiting,  bloating, diarrhea, constipation, hematemesis, melena, hematochezia, jaundice, hemorrhoids Genitourinary: Denies dysuria, frequency, urgency, nocturia, hesitancy, discharge, hematuria, flank pain Breast: Breast lumps, nipple discharge, bleeding.  Musculoskeletal: Denies arthralgia, myalgia, stiffness, Jt. Swelling, pain, limp, and strain/sprain. Denies falls. Skin: Denies puritis, rash, hives, warts, acne, eczema, changing in skin lesion Neuro: No weakness, tremor, incoordination, spasms, paresthesia, pain Psychiatric: Denies confusion, memory loss, sensory loss. Denies Depression. Endocrine: Denies change in weight, skin, hair change, nocturia, and paresthesia, diabetic polys, visual blurring, hyper / hypo glycemic episodes.  Heme/Lymph: No excessive bleeding, bruising, enlarged lymph nodes.  Physical Exam  BP 128/64   Pulse 87   Temp 97.7 F (36.5 C)   Resp 16   Ht 5\' 2"  (1.575 m)   Wt 104 lb 9.6 oz (47.4 kg)   SpO2 96%   BMI 19.13 kg/m   General Appearance: Well nourished, well groomed and in no apparent distress.  Eyes: PERRLA, EOMs, conjunctiva no swelling or erythema, normal fundi and vessels. Sinuses: No frontal/maxillary tenderness ENT/Mouth: EACs patent / TMs  nl. Nares clear without erythema, swelling, mucoid exudates. Oral hygiene is good.  Mallampati  III. No erythema, swelling, or exudate. Tongue normal. Tonsils not swollen or erythematous. Hearing normal.  Neck: Supple, thyroid not palpable. No bruits, nodes or JVD. Respiratory: Respiratory effort normal.  BS equal and clear bilateral without rales, rhonci, wheezing or stridor. Cardio: Heart sounds are normal with regular rate and rhythm and no murmurs, rubs or gallops. Peripheral pulses are normal and equal bilaterally without edema. No aortic or femoral bruits. Chest: symmetric with normal excursions and percussion. Breasts: Symmetric, without lumps, nipple discharge, retractions, or fibrocystic changes.  Abdomen:  Flat, soft with bowel sounds active. Nontender, no guarding, rebound, hernias, masses, or organomegaly.  Lymphatics: Non tender without lymphadenopathy.   Musculoskeletal: Full ROM all peripheral extremities, joint stability, 5/5 strength, and normal gait. Skin: Warm and dry without rashes, lesions, cyanosis, clubbing or  ecchymosis.  Neuro: Cranial nerves intact, reflexes equal bilaterally. Normal muscle tone, no cerebellar symptoms. Sensation intact.  Pysch: Alert and oriented X 3, normal affect, Insight and Judgment appropriate.    Assessment and Plan  1. Annual Preventative Screening Examination  2. Essential hypertension  - EKG 12-Lead - Urinalysis, Routine w reflex microscopic - Microalbumin / creatinine urine ratio - CBC with Differential/Platelet - COMPLETE METABOLIC PANEL WITH GFR - Magnesium - TSH  3. Hyperlipidemia, mixed  - EKG 12-Lead - Lipid panel - TSH  4. Abnormal glucose  - EKG 12-Lead - Hemoglobin A1c - Insulin, random  5. Vitamin D deficiency  - VITAMIN D 25 Hydroxy   6. OSA and COPD overlap syndrome ([place])  7. Aortic atherosclerosis ([place]) by Chest CTscan on 05/05/2018  - EKG 12-Lead  8. Screening for colorectal cancer  - POC Hemoccult Bld/Stl  9. Screening for ischemic heart disease  - EKG 12-Lead  10. Family hx-stroke  - EKG 12-Lead  11. Former smoker  - EKG 12-Lead  12. Medication management  - EKG 12-Lead - Urinalysis, Routine w reflex microscopic - Microalbumin / creatinine urine ratio - CBC with Differential/Platelet - COMPLETE METABOLIC PANEL WITH GFR - Magnesium - Lipid panel - TSH - Hemoglobin A1c - Insulin, random - VITAMIN D 25 Hydroxy  13. Bilateral low back pain with sciatica  - dexamethasone (DECADRON) 4 MG tablet; Take 1 tab 3 x day - 3 days, then 2 x day - 3 days, then 1 tab daily  Dispense: 20 tablet  - cyclobenzaprine (FLEXERIL) 10 MG tablet; Take 1/2 to 1 tablet 3 x /day as needed for Muscle Spasm   Dispense: 30 tablet; Refill: 1          Patient was counseled in prudent diet to achieve/maintain BMI less than 25 for weight control, BP monitoring, regular exercise and medications. Discussed med's effects and SE's. Screening labs and tests as requested with regular follow-up as recommended. Over 40 minutes of exam, counseling, chart review and high complex critical decision making was performed.   Kirtland Bouchard, MD

## 2020-10-22 ENCOUNTER — Ambulatory Visit (INDEPENDENT_AMBULATORY_CARE_PROVIDER_SITE_OTHER): Payer: Medicare HMO | Admitting: Internal Medicine

## 2020-10-22 ENCOUNTER — Other Ambulatory Visit: Payer: Self-pay

## 2020-10-22 ENCOUNTER — Encounter: Payer: Self-pay | Admitting: Internal Medicine

## 2020-10-22 VITALS — BP 128/64 | HR 87 | Temp 97.7°F | Resp 16 | Ht 62.0 in | Wt 104.6 lb

## 2020-10-22 DIAGNOSIS — Z136 Encounter for screening for cardiovascular disorders: Secondary | ICD-10-CM

## 2020-10-22 DIAGNOSIS — C8231 Follicular lymphoma grade IIIa, lymph nodes of head, face, and neck: Secondary | ICD-10-CM

## 2020-10-22 DIAGNOSIS — Z79899 Other long term (current) drug therapy: Secondary | ICD-10-CM

## 2020-10-22 DIAGNOSIS — Z823 Family history of stroke: Secondary | ICD-10-CM

## 2020-10-22 DIAGNOSIS — Z0000 Encounter for general adult medical examination without abnormal findings: Secondary | ICD-10-CM | POA: Diagnosis not present

## 2020-10-22 DIAGNOSIS — Z0001 Encounter for general adult medical examination with abnormal findings: Secondary | ICD-10-CM

## 2020-10-22 DIAGNOSIS — I10 Essential (primary) hypertension: Secondary | ICD-10-CM | POA: Diagnosis not present

## 2020-10-22 DIAGNOSIS — R7309 Other abnormal glucose: Secondary | ICD-10-CM

## 2020-10-22 DIAGNOSIS — Z1211 Encounter for screening for malignant neoplasm of colon: Secondary | ICD-10-CM

## 2020-10-22 DIAGNOSIS — J449 Chronic obstructive pulmonary disease, unspecified: Secondary | ICD-10-CM

## 2020-10-22 DIAGNOSIS — E782 Mixed hyperlipidemia: Secondary | ICD-10-CM | POA: Diagnosis not present

## 2020-10-22 DIAGNOSIS — G4733 Obstructive sleep apnea (adult) (pediatric): Secondary | ICD-10-CM

## 2020-10-22 DIAGNOSIS — M5440 Lumbago with sciatica, unspecified side: Secondary | ICD-10-CM

## 2020-10-22 DIAGNOSIS — Z87891 Personal history of nicotine dependence: Secondary | ICD-10-CM

## 2020-10-22 DIAGNOSIS — E559 Vitamin D deficiency, unspecified: Secondary | ICD-10-CM

## 2020-10-22 DIAGNOSIS — I700 Atherosclerosis of aorta: Secondary | ICD-10-CM

## 2020-10-22 MED ORDER — CYCLOBENZAPRINE HCL 10 MG PO TABS: ORAL_TABLET | ORAL | 1 refills | Status: DC

## 2020-10-22 MED ORDER — DEXAMETHASONE 4 MG PO TABS: ORAL_TABLET | ORAL | 0 refills | Status: DC

## 2020-10-23 ENCOUNTER — Other Ambulatory Visit: Payer: Self-pay | Admitting: Internal Medicine

## 2020-10-23 DIAGNOSIS — Z1231 Encounter for screening mammogram for malignant neoplasm of breast: Secondary | ICD-10-CM

## 2020-10-23 LAB — LIPID PANEL
Cholesterol: 208 mg/dL — ABNORMAL HIGH (ref <200)
HDL: 57 mg/dL (ref >=50)
LDL Cholesterol (Calc): 118 mg/dL (calc) — ABNORMAL HIGH
Non-HDL Cholesterol (Calc): 151 mg/dL (calc) — ABNORMAL HIGH (ref <130)
Total CHOL/HDL Ratio: 3.6 (calc) (ref <5.0)
Triglycerides: 210 mg/dL — ABNORMAL HIGH (ref <150)

## 2020-10-23 LAB — CBC WITH DIFFERENTIAL/PLATELET
Absolute Monocytes: 392 cells/uL (ref 200–950)
Basophils Absolute: 32 cells/uL (ref 0–200)
Basophils Relative: 0.7 %
Eosinophils Absolute: 113 cells/uL (ref 15–500)
Eosinophils Relative: 2.5 %
HCT: 44 % (ref 35.0–45.0)
Hemoglobin: 14.4 g/dL (ref 11.7–15.5)
Lymphs Abs: 1071 cells/uL (ref 850–3900)
MCH: 31.5 pg (ref 27.0–33.0)
MCHC: 32.7 g/dL (ref 32.0–36.0)
MCV: 96.3 fL (ref 80.0–100.0)
MPV: 11 fL (ref 7.5–12.5)
Monocytes Relative: 8.7 %
Neutro Abs: 2894 cells/uL (ref 1500–7800)
Neutrophils Relative %: 64.3 %
Platelets: 211 10*3/uL (ref 140–400)
RBC: 4.57 10*6/uL (ref 3.80–5.10)
RDW: 11.9 % (ref 11.0–15.0)
Total Lymphocyte: 23.8 %
WBC: 4.5 10*3/uL (ref 3.8–10.8)

## 2020-10-23 LAB — COMPLETE METABOLIC PANEL WITH GFR
AG Ratio: 1.7 (calc) (ref 1.0–2.5)
ALT: 20 U/L (ref 6–29)
AST: 22 U/L (ref 10–35)
Albumin: 4.5 g/dL (ref 3.6–5.1)
Alkaline phosphatase (APISO): 138 U/L (ref 37–153)
BUN: 16 mg/dL (ref 7–25)
CO2: 38 mmol/L — ABNORMAL HIGH (ref 20–32)
Calcium: 10.1 mg/dL (ref 8.6–10.4)
Chloride: 98 mmol/L (ref 98–110)
Creat: 0.9 mg/dL (ref 0.60–1.00)
Globulin: 2.7 g/dL (calc) (ref 1.9–3.7)
Glucose, Bld: 83 mg/dL (ref 65–99)
Potassium: 4.4 mmol/L (ref 3.5–5.3)
Sodium: 143 mmol/L (ref 135–146)
Total Bilirubin: 0.3 mg/dL (ref 0.2–1.2)
Total Protein: 7.2 g/dL (ref 6.1–8.1)
eGFR: 65 mL/min/{1.73_m2} (ref >=60)

## 2020-10-23 LAB — URINALYSIS, ROUTINE W REFLEX MICROSCOPIC
Bilirubin Urine: NEGATIVE
Glucose, UA: NEGATIVE
Hgb urine dipstick: NEGATIVE
Ketones, ur: NEGATIVE
Leukocytes,Ua: NEGATIVE
Nitrite: NEGATIVE
Protein, ur: NEGATIVE
Specific Gravity, Urine: 1.016 (ref 1.001–1.035)
pH: 8 (ref 5.0–8.0)

## 2020-10-23 LAB — MICROALBUMIN / CREATININE URINE RATIO
Creatinine, Urine: 97 mg/dL (ref 20–275)
Microalb Creat Ratio: 5 mcg/mg creat (ref <30)
Microalb, Ur: 0.5 mg/dL

## 2020-10-23 LAB — HEMOGLOBIN A1C
Hgb A1c MFr Bld: 5.5 % of total Hgb (ref <5.7)
Mean Plasma Glucose: 111 mg/dL
eAG (mmol/L): 6.2 mmol/L

## 2020-10-23 LAB — MAGNESIUM: Magnesium: 2.4 mg/dL (ref 1.5–2.5)

## 2020-10-23 LAB — VITAMIN D 25 HYDROXY (VIT D DEFICIENCY, FRACTURES): Vit D, 25-Hydroxy: 61 ng/mL (ref 30–100)

## 2020-10-23 LAB — INSULIN, RANDOM: Insulin: 15.8 u[IU]/mL

## 2020-10-23 LAB — TSH: TSH: 1.23 mIU/L (ref 0.40–4.50)

## 2020-10-23 NOTE — Progress Notes (Signed)
============================================================ -   Test results slightly outside the reference range are not unusual. If there is anything important, I will review this with you,  otherwise it is considered normal test values.  If you have further questions,  please do not hesitate to contact me at the office or via My Chart.  ============================================================ ============================================================  - Total Chol = 208 - elevated -            (  Ideal or Goal is less than 180  )  - and   - Dangerous / Bad Cholesterol = 118 - also too high              (  Ideal or Goal is less than 70  !  )   -[ So - Cholesterol is too high - Recommend low cholesterol diet   - Cholesterol only comes from animal sources  - ie. meat, dairy, egg yolks  - Eat all the vegetables you want.  - Avoid meat, especially red meat - Beef AND Pork .  - Avoid cheese & dairy - milk & ice cream.     - Cheese is the most concentrated form of trans-fats which  is the worst thing to clog up our arteries.   - Veggie cheese is OK which can be found in the fresh  produce section at [employer] or [employer] or [employer] ============================================================ ============================================================  -  A1c - Normal - Great - No Diabetes  ! ============================================================ ============================================================  -  Vitamin D = 61 - Great  - Please keep dose same ============================================================ ============================================================  -  All Else - CBC - Kidneys - Electrolytes - Liver - Magnesium & Thyroid    - all  Normal / OK ============================================================ ============================================================

## 2020-10-24 ENCOUNTER — Telehealth: Payer: Self-pay | Admitting: Internal Medicine

## 2020-10-24 NOTE — Progress Notes (Signed)
  Chronic Care Management   Outreach Note  10/24/2020 Name: Renee Hawkins MRN: [id number] DOB: 1940-04-12  Referred by: Unk Pinto, MD Reason for referral : No chief complaint on file.   Third unsuccessful telephone outreach was attempted today. The patient was referred to the pharmacist for assistance with care management and care coordination.   Follow Up Plan:   Tatjana Dellinger Upstream Scheduler

## 2020-10-25 ENCOUNTER — Telehealth: Payer: Self-pay | Admitting: Internal Medicine

## 2020-10-25 NOTE — Progress Notes (Signed)
  Chronic Care Management   Note  10/25/2020 Name: Renee Hawkins MRN: [id number] DOB: 05/29/1940  ANUHYA SURGEON is a 80 y.o. year old female who is a primary care patient of Unk Pinto, MD. I reached out to Renee Hawkins by phone today in response to a referral sent by Ms. Kerri Perches PCP, Unk Pinto, MD.   Renee Hawkins was given information about Chronic Care Management services today including:  CCM service includes personalized support from designated clinical staff supervised by her physician, including individualized plan of care and coordination with other care providers 24/7 contact phone numbers for assistance for urgent and routine care needs. Service will only be billed when office clinical staff spend 20 minutes or more in a month to coordinate care. Only one practitioner may furnish and bill the service in a calendar month. The patient may stop CCM services at any time (effective at the end of the month) by phone call to the office staff.   Patient agreed to services and verbal consent obtained.   Follow up plan:   Tatjana Secretary/administrator

## 2020-11-06 ENCOUNTER — Ambulatory Visit
Admission: RE | Admit: 2020-11-06 | Discharge: 2020-11-06 | Disposition: A | Discharge status: Home / Self Care | Payer: Medicare HMO | Source: Ambulatory Visit | Attending: Internal Medicine | Admitting: Internal Medicine

## 2020-11-06 ENCOUNTER — Other Ambulatory Visit: Payer: Self-pay

## 2020-11-06 DIAGNOSIS — Z1231 Encounter for screening mammogram for malignant neoplasm of breast: Secondary | ICD-10-CM

## 2020-11-24 NOTE — Progress Notes (Signed)
Future Appointments  Date Time Provider [place]  11/25/2020  4:00 PM Unk Pinto, MD [facility] None  12/31/2020 11:00 AM Rush Landmark, [place] [facility] None  01/06/2021  1:00 PM Dohmeier, Asencion Partridge, MD [facility] None  02/06/2021 11:00 AM Heath Lark, MD [facility] None  04/29/2021  2:30 PM Magda Bernheim, NP [facility] None  06/17/2021  2:30 PM Magda Bernheim, NP [facility] None  10/22/2021  2:00 PM Unk Pinto, MD [facility] None    History of Present Illness:      This very nice 80 y.o.  [place] with hx/o labile HTN, HLD, Prediabetes, COPD & OSA/CPAP  Overlap & Vitamin D Deficiency presents for concern re: recent elevated BP and burning of her toe.  Patient 's HTN predates from 81 and she had been treated with diuretics til d/c'd in 2014 because of ongoing problems with hypokalemia and hypomagnesemia.  Her neighbor friend bringing her in today reports BP's have been ranging up to 160-170 /80-90.  She  also reports that patient has had episodes agitated anxiety especially in the late afternoons or evening.    Medications    rosuvastatin 5 MG tablet, TAKE 1 TABLET DAILY    STIOLTO RESPIMAT 2.5-2.5 , INHALE 2 PUFFS EVERY DAY:    aspirin EC 81 MG tablet, Take daily.   Coenzyme Q10 10 MG capsule, Take  2 times daily.   cyclobenzaprine  10 MG tablet, Take 1/2 to 1 tablet 3 x /day as needed for Muscle Spasm   gabapentin  300 MG capsule, TAKE 1 CAPSULE THREE TO FOUR TIMES DAILY AS NEEDED   Magnesium 250 MG TABS, Take daily.    omeprazole 40 MG capsule, TAKE 1 CAPSULE DAILY    K-DUR 10 MEQ tab, Take 48mq in the AM and in the PM   vitamin C 500 MG tablet, Take  daily.  Problem list She has Essential hypertension; GERD ; Hx of adenomatous colonic polyps; Hyperlipidemia, mixed; Abnormal glucose; Vitamin D deficiency; RLS (restless legs syndrome); Vitamin B12 deficiency; Anxiety; CKD (chronic kidney disease) stage 3, GFR 30-59 ml/min (HCC); COPD (chronic obstructive pulmonary disease) with  emphysema ([place]; Medication management; OSA and COPD overlap syndrome ([place]; Osteoporosis; Follicular lymphoma grade iiia, lymph nodes of head, face, and neck ([place]; Hypokalemia; Anemia, chronic disease; Aortic atherosclerosis ([place] by Chest CTscan on 05/05/2018; Left displaced femoral neck fracture ([place]; Lumbar radiculopathy; Mild malnutrition ([place]; Nocturnal hypoxemia due to obstructive chronic bronchitis (HCC); and OSA (obstructive sleep apnea) on their problem list.   Observations/Objective:  BP (!) 154/80   Pulse 84   Temp (!) 97.5 F (36.4 C)   Resp 16   Ht '5\' 2"'$  (1.575 m)   Wt 100 lb 12.8 oz (45.7 kg)   SpO2 98%   BMI 18.44 kg/m   HEENT - WNL. Neck - supple.  Chest - Clear equal BS. Cor - Nl HS. RRR w/o sig MGR. PP 1(+). No edema. MS- FROM w/o deformities.  Gait Nl. Neuro -  Nl w/o focal abnormalities. Psyche. Patient ruminates feelings of loneliness (no friends), depression & feeling isolated.   Assessment and Plan:   1. Essential hypertension  - atenolol 50 MG tablet;  Take  1 tablet  every Morning  for BP   Disp: 30 tablet; Refill: 0  2. Peripheral sensory neuropathy  - gabapentin  100 MG capsule;  Take 1 capsule 3 x /day with Breakfast, Supper &             Bedtime for Neuropathy Pain  Disp: 90 capsule; Refill: 0  3. Anxiety and depression  - QUEtiapine (SEROQUEL) 25 MG tablet;  Take 1/2 to 1 tablet 2 x /day at [place] for Anxiety & Mood   Disp: 60 tablet; Refill: 0   Follow Up Instructions: Advised ROV - 2 weeks to reassess        I discussed the assessment and treatment plan with the patient. The patient was provided an opportunity to ask questions and all were answered. The patient agreed with the plan and demonstrated an understanding of the instructions.       The patient was advised to call back or seek an in-person evaluation if the symptoms worsen or if the condition fails to improve as anticipated.   Kirtland Bouchard, MD

## 2020-11-25 ENCOUNTER — Ambulatory Visit (INDEPENDENT_AMBULATORY_CARE_PROVIDER_SITE_OTHER): Payer: Medicare HMO | Admitting: Internal Medicine

## 2020-11-25 ENCOUNTER — Encounter: Payer: Self-pay | Admitting: Internal Medicine

## 2020-11-25 ENCOUNTER — Other Ambulatory Visit: Payer: Self-pay

## 2020-11-25 VITALS — BP 154/80 | HR 84 | Temp 97.5°F | Resp 16 | Ht 62.0 in | Wt 100.8 lb

## 2020-11-25 DIAGNOSIS — I10 Essential (primary) hypertension: Secondary | ICD-10-CM | POA: Diagnosis not present

## 2020-11-25 DIAGNOSIS — F32A Depression, unspecified: Secondary | ICD-10-CM | POA: Diagnosis not present

## 2020-11-25 DIAGNOSIS — G608 Other hereditary and idiopathic neuropathies: Secondary | ICD-10-CM

## 2020-11-25 DIAGNOSIS — F419 Anxiety disorder, unspecified: Secondary | ICD-10-CM

## 2020-11-25 MED ORDER — QUETIAPINE FUMARATE 25 MG PO TABS: ORAL_TABLET | ORAL | 0 refills | Status: DC

## 2020-11-25 MED ORDER — GABAPENTIN 100 MG PO CAPS: ORAL_CAPSULE | ORAL | 0 refills | Status: DC

## 2020-11-25 MED ORDER — ATENOLOL 50 MG PO TABS: ORAL_TABLET | ORAL | 0 refills | Status: DC

## 2020-12-09 NOTE — Progress Notes (Signed)
Future Appointments  Date Time Provider [place]  12/10/2020  - F/U  11:30 AM Unk Pinto, MD [facility] None  12/31/2020  - CCM  11:00 AM Rush Landmark, [place] [facility] None  01/06/2021  1:00 PM Dohmeier, Asencion Partridge, MD [facility] None  02/06/2021 11:00 AM Heath Lark, MD [facility] None  04/29/2021  - 6 month OV  2:30 PM Magda Bernheim, NP [facility] None  06/17/2021  - [facility]   2:30 PM Magda Bernheim, NP [facility] None  10/22/2021  - CPE   2:00 PM Unk Pinto, MD [facility] None    History of Present Illness:     The patient is a very nice 80 yo WWF  with hx/o labile HTN, painful Peripheral Sensory Neuropathy and Chronic Anxiety /Depression who returns for 2 week f/u after adding Atenolol 50 mg, Gabapentin 100 mg tid and Quetiapine 25 mg bid. Patient reports "feeling better". Denies HAs, dizziness, CP, palpitations orthopnea.  Is not taking the Gabapentin - say only having numbness - not pain- of her feet now. Reports anxiety improved and sleeping better on only 1 Seroquel 25 mg w/supper   Medications    atenolol (TENORMIN) 50 MG tablet, Take  1 tablet  every Morning  for BP   rosuvastatin (CRESTOR) 5 MG tablet, TAKE 1 TABLET DAILY FOR CHOLESTEROL    STIOLTO RESPIMAT 2.5-2.5  INHALE 2 PUFFS EVERY DAY   aspirin EC 81 MG tablet, Take daily.   Coenzyme Q10 10 MG capsule, Take 2 (two) times daily.   cyclobenzaprine (FLEXERIL) 10 MG tablet, Take 1/2 to 1 tablet 3 x /day as needed for Muscle Spasm   Magnesium 250 MG TABS, Take 250 mg by mouth daily.    omeprazole 40 MG capsule, TAKE 1 CAPSULE DAILY FOR INDIGESTION AND REFLUX   potassium chloride  10 MEQ tablet, Take 10-20 mEq by mouth See admin instructions. 10mq in the AM and in the PM   QUEtiapine 25 MG tablet, Take 1/2 to 1 tablet 2 x /[place]   vitamin C 500 MG tablet, Take 500 mg by mouth daily.   gabapentin 100 MG capsule, Take 1 capsule 3 x /day with Breakfast , Supper & Bedtime for  Neuropathy Pain (Patient not taking: Reported on 12/10/2020)  Problem list She has Essential hypertension; GERD ; Hx of adenomatous colonic polyps; Hyperlipidemia, mixed; Abnormal glucose; Vitamin D deficiency; RLS (restless legs syndrome); Vitamin B12 deficiency; Anxiety; CKD (chronic kidney disease) stage 3, GFR 30-59 ml/min (HCC); COPD (chronic obstructive pulmonary disease) with emphysema ([place]; Medication management; OSA and COPD overlap syndrome ([place]; Osteoporosis; Follicular lymphoma grade iiia, lymph nodes of head, face, and neck ([place]; Hypokalemia; Anemia, chronic disease; Aortic atherosclerosis ([place] by Chest CTscan on 05/05/2018; Left displaced femoral neck fracture (HB[place]; Lumbar radiculopathy; Mild malnutrition ([place]; Nocturnal hypoxemia due to obstructive chronic bronchitis (HCC); and OSA (obstructive sleep apnea) on their problem list.   Observations/Objective:  BP 140/70   Pulse (!) 54   Temp 97.7 F (36.5 C)   Wt 99 lb 12.8 oz (45.3 kg)   SpO2 97%   BMI 18.25 kg/m   HEENT - WNL. Neck - supple.  Chest - Clear equal BS. Cor - Nl HS. RRR w/o sig MGR. PP 1(+). No edema. MS- FROM w/o deformities.  Gait Nl. Neuro -  Nl w/o focal abnormalities.   Assessment and Plan:  1. Essential hypertension  2. Peripheral sensory neuropathy  Follow Up Instructions:  I discussed the assessment and treatment plan with the patient. The patient was provided an opportunity to ask questions and all were answered. The patient agreed with the plan and demonstrated an understanding of the instructions.       The patient was advised to call back or seek an in-person evaluation if the symptoms worsen or if the condition fails to improve as anticipated.    Kirtland Bouchard, MD

## 2020-12-10 ENCOUNTER — Encounter: Payer: Self-pay | Admitting: Internal Medicine

## 2020-12-10 ENCOUNTER — Other Ambulatory Visit: Payer: Self-pay

## 2020-12-10 ENCOUNTER — Ambulatory Visit (INDEPENDENT_AMBULATORY_CARE_PROVIDER_SITE_OTHER): Payer: Medicare HMO | Admitting: Internal Medicine

## 2020-12-10 VITALS — BP 140/70 | HR 54 | Temp 97.7°F | Resp 12 | Ht 62.5 in | Wt 99.8 lb

## 2020-12-10 DIAGNOSIS — G608 Other hereditary and idiopathic neuropathies: Secondary | ICD-10-CM

## 2020-12-10 DIAGNOSIS — I10 Essential (primary) hypertension: Secondary | ICD-10-CM

## 2020-12-10 MED ORDER — MIRTAZAPINE 15 MG PO TABS: ORAL_TABLET | ORAL | 3 refills | Status: DC

## 2020-12-10 NOTE — Patient Instructions (Signed)

## 2020-12-16 ENCOUNTER — Other Ambulatory Visit: Payer: Self-pay | Admitting: Internal Medicine

## 2020-12-16 DIAGNOSIS — I10 Essential (primary) hypertension: Secondary | ICD-10-CM

## 2020-12-17 ENCOUNTER — Other Ambulatory Visit: Payer: Self-pay | Admitting: Internal Medicine

## 2020-12-17 DIAGNOSIS — F32A Depression, unspecified: Secondary | ICD-10-CM

## 2020-12-17 DIAGNOSIS — F419 Anxiety disorder, unspecified: Secondary | ICD-10-CM

## 2020-12-31 ENCOUNTER — Other Ambulatory Visit: Payer: Self-pay | Admitting: Internal Medicine

## 2020-12-31 ENCOUNTER — Telehealth: Payer: Self-pay

## 2020-12-31 ENCOUNTER — Other Ambulatory Visit: Payer: Self-pay

## 2020-12-31 ENCOUNTER — Ambulatory Visit (INDEPENDENT_AMBULATORY_CARE_PROVIDER_SITE_OTHER): Payer: Medicare HMO | Admitting: Pharmacist

## 2020-12-31 DIAGNOSIS — R7309 Other abnormal glucose: Secondary | ICD-10-CM

## 2020-12-31 DIAGNOSIS — J4531 Mild persistent asthma with (acute) exacerbation: Secondary | ICD-10-CM

## 2020-12-31 DIAGNOSIS — Z79899 Other long term (current) drug therapy: Secondary | ICD-10-CM

## 2020-12-31 DIAGNOSIS — G459 Transient cerebral ischemic attack, unspecified: Secondary | ICD-10-CM

## 2020-12-31 DIAGNOSIS — I700 Atherosclerosis of aorta: Secondary | ICD-10-CM

## 2020-12-31 DIAGNOSIS — I10 Essential (primary) hypertension: Secondary | ICD-10-CM | POA: Diagnosis not present

## 2020-12-31 DIAGNOSIS — E782 Mixed hyperlipidemia: Secondary | ICD-10-CM

## 2020-12-31 DIAGNOSIS — G608 Other hereditary and idiopathic neuropathies: Secondary | ICD-10-CM

## 2020-12-31 DIAGNOSIS — E559 Vitamin D deficiency, unspecified: Secondary | ICD-10-CM

## 2020-12-31 DIAGNOSIS — K2100 Gastro-esophageal reflux disease with esophagitis, without bleeding: Secondary | ICD-10-CM

## 2020-12-31 DIAGNOSIS — N183 Chronic kidney disease, stage 3 unspecified: Secondary | ICD-10-CM | POA: Diagnosis not present

## 2020-12-31 DIAGNOSIS — G4733 Obstructive sleep apnea (adult) (pediatric): Secondary | ICD-10-CM

## 2020-12-31 DIAGNOSIS — F419 Anxiety disorder, unspecified: Secondary | ICD-10-CM | POA: Diagnosis not present

## 2020-12-31 DIAGNOSIS — F32A Depression, unspecified: Secondary | ICD-10-CM | POA: Diagnosis not present

## 2020-12-31 DIAGNOSIS — J449 Chronic obstructive pulmonary disease, unspecified: Secondary | ICD-10-CM | POA: Diagnosis not present

## 2020-12-31 MED ORDER — ALBUTEROL SULFATE HFA 108 (90 BASE) MCG/ACT IN AERS: INHALATION_SPRAY | RESPIRATORY_TRACT | 3 refills | Status: DC

## 2020-12-31 NOTE — Progress Notes (Signed)
Chronic Care Management Pharmacy Note  12/31/2020 Name:  Renee Hawkins MRN:  [id number] DOB:  01/09/1941  Summary: Patient is doing well overall but does not report significant improvement in anxiety or depression.  Recommendations/Changes made from today's visit: -Enrolled patient in remote patient monitoring for BP -Discussed rescue vs. Maintenance inhalers for COPD -Medication list verified, patient instructed to restart some medications that were discontinued by patient -Discussed CBT referral    Subjective: Renee Hawkins is an 80 y.o. year old female who is a primary patient of Unk Pinto, MD.  The CCM team was consulted for assistance with disease management and care coordination needs.    Engaged with patient face to face for initial visit in response to provider referral for pharmacy case management and/or care coordination services.   Consent to Services:  The patient was given information about Chronic Care Management services, agreed to services, and gave verbal consent prior to initiation of services.  Please see initial visit note for detailed documentation.   Patient Care Team: Unk Pinto, MD as PCP - General (Internal Medicine) Lafayette Dragon, MD (Inactive) as Consulting Physician (Gastroenterology) Loletha Carrow, MD (Pulmonary Disease) Heath Lark, MD as Consulting Physician (Hematology and Oncology) Rozetta Nunnery, MD as Consulting Physician (Otolaryngology) Chales Salmon, [place] as Consulting Physician (Optometry) Rush Landmark, [facility] as Pharmacist (Pharmacist)  Recent office visits: 12/10/2020- Dr. Melford Aase- Patient was seen for hypertension and neuropathy. Start Mirtaapine 71m- take 1 at bedtime for appetite and sleep.    11/25/2020- Dr. MMelford Aase Patient was seen for hypertension, neuropathy, and anxiety and depression. Started Atenonol 524m 1 tablet every morning, Quetiapine fumarate 2568mtake 1/2 to 1 tab 2x a day at noon and  suppertime for anxiety & mood. Change Gabapentin 100m25make 1 capsule 3x a day with breakfast, supper, and bedtime for neuropathy pain. Stop dexamethasone 4mg.27m7/19/2022- Dr. McKeoMelford Aaseient was seen for an adult medical examination. Started Cyclobenazaprine HCl 10mg 34mke 1/2 to 1 tab 3x a day as needed for muscle spasm and dexamethsone 4mg- t79m 1 tab 3x a day- 3days, then 2x day- 3days, then 1 tab daily.   Recent consult visits: None  Hospital visits: None in previous 6 months   Objective:  Lab Results  Component Value Date   CREATININE 0.90 10/22/2020   BUN 16 10/22/2020   GFRNONAA 73 08/28/2020   GFRAA 84 08/28/2020   NA 143 10/22/2020   K 4.4 10/22/2020   CALCIUM 10.1 10/22/2020   CO2 38 (H) 10/22/2020   GLUCOSE 83 10/22/2020    Lab Results  Component Value Date/Time   HGBA1C 5.5 10/22/2020 01:42 PM   HGBA1C 5.1 12/04/2019 02:17 PM   MICROALBUR 0.5 10/22/2020 01:42 PM   MICROALBUR 0.3 08/31/2019 03:05 PM    Last diabetic Eye exam: No results found for: HMDIABEYEEXA  Last diabetic Foot exam: No results found for: HMDIABFOOTEX   Lab Results  Component Value Date   CHOL 208 (H) 10/22/2020   HDL 57 10/22/2020   LDLCALC 118 (H) 10/22/2020   TRIG 210 (H) 10/22/2020   CHOLHDL 3.6 10/22/2020    Hepatic Function Latest Ref Rng & Units 10/22/2020 08/28/2020 06/17/2020  Total Protein 6.1 - 8.1 g/dL 7.2 7.2 6.7  Albumin 3.5 - 5.0 g/dL - - -  AST 10 - 35 U/L 22 17 18   ALT 6 - 29 U/L 20 9 12   Alk Phosphatase 38 - 126 U/L - - -  Total Bilirubin 0.2 - 1.2  mg/dL 0.3 0.6 0.6  Bilirubin, Direct 0.0 - 0.2 mg/dL - - -    Lab Results  Component Value Date/Time   TSH 1.23 10/22/2020 01:42 PM   TSH 1.36 12/04/2019 02:17 PM    CBC Latest Ref Rng & Units 10/22/2020 08/28/2020 06/17/2020  WBC 3.8 - 10.8 Thousand/uL 4.5 7.1 6.0  Hemoglobin 11.7 - 15.5 g/dL 14.4 15.1 14.3  Hematocrit 35.0 - 45.0 % 44.0 44.9 41.7  Platelets 140 - 400 Thousand/uL 211 171 154    Lab Results   Component Value Date/Time   VD25OH 61 10/22/2020 01:42 PM   VD25OH 50 12/04/2019 02:17 PM    Clinical ASCVD: Yes  The 10-year ASCVD risk score (Arnett DK, et al., 2019) is: 33%   Values used to calculate the score:     Age: 80 years     Sex: Female     Is Non-Hispanic African American: No     Diabetic: No     Tobacco smoker: No     Systolic Blood Pressure: 336 mmHg     Is BP treated: Yes     HDL Cholesterol: 57 mg/dL     Total Cholesterol: 208 mg/dL    Depression screen [facility] 2/9 10/21/2020 09/22/2020 09/02/2020  Decreased Interest 0 0 0  Down, Depressed, Hopeless 0 0 0  PHQ - 2 Score 0 0 0     See below as applicable Other: (PQAES9PNPY if Afib, MMRC or CAT for COPD, ACT, DEXA)  Social History   Tobacco Use  Smoking Status Former   Packs/day: 1.00   Years: 30.00   Pack years: 30.00   Types: Cigarettes   Quit date: 04/06/2012   Years since quitting: 8.7  Smokeless Tobacco Never   BP Readings from Last 3 Encounters:  12/10/20 140/70  11/25/20 (!) 154/80  10/22/20 128/64   Pulse Readings from Last 3 Encounters:  12/10/20 (!) 54  11/25/20 84  10/22/20 87   Wt Readings from Last 3 Encounters:  12/10/20 99 lb 12.8 oz (45.3 kg)  11/25/20 100 lb 12.8 oz (45.7 kg)  10/22/20 104 lb 9.6 oz (47.4 kg)   BMI Readings from Last 3 Encounters:  12/10/20 17.96 kg/m  11/25/20 18.44 kg/m  10/22/20 19.13 kg/m    Assessment/Interventions: Review of patient past medical history, allergies, medications, health status, including review of consultants reports, laboratory and other test data, was performed as part of comprehensive evaluation and provision of chronic care management services.   SDOH:  (Social Determinants of Health) assessments and interventions performed: Yes SDOH Interventions    Flowsheet Row Most Recent Value  SDOH Interventions   Housing Interventions Intervention Not Indicated  Transportation Interventions Intervention Not Indicated      SDOH  Screenings   Alcohol Screen: Not on file  Depression (PHQ2-9): Low Risk    PHQ-2 Score: 0  Financial Resource Strain: Not on file  Food Insecurity: Not on file  Housing: [place] Risk Score: 0  Physical Activity: Not on file  Social Connections: Not on file  Stress: Not on file  Tobacco Use: Medium Risk   Smoking Tobacco Use: Former   Smokeless Tobacco Use: Never  Transportation Needs: No Transportation Needs   Lack of Transportation (Medical): No   Lack of Transportation (Non-Medical): No    CCM Care Plan  No Known Allergies  Medications Reviewed Today     Reviewed by Rush Landmark, [facility] (Pharmacist) on 12/31/20 at 1149  Med List Status: <None>  Medication Order Taking? Sig Documenting Provider Last Dose Status Informant  aspirin EC 81 MG tablet [id number] Yes Take 81 mg by mouth daily. [provider] Taking Active   atenolol (TENORMIN) 50 MG tablet [id number] Yes TAKE 1 TABLET BY MOUTH ONCE DAILY IN THE MORNING FOR BLOOD PRESSURE Magda Bernheim, NP Taking Active   Coenzyme Q10 10 MG capsule [id number] No Take 10 mg by mouth 2 (two) times daily.  Patient not taking: Reported on 12/31/2020   [provider] Not Taking Active   cyclobenzaprine (FLEXERIL) 10 MG tablet [id number] No Take 1/2 to 1 tablet 3 x /day as needed for Muscle Spasm  Patient not taking: Reported on 12/31/2020   Unk Pinto, MD Not Taking Active   gabapentin (NEURONTIN) 100 MG capsule [id number] No Take 1 capsule 3 x /day with Breakfast , Supper & Bedtime for Neuropathy Pain  Patient not taking: No sig reported   Unk Pinto, MD Not Taking Active   ibuprofen (ADVIL) 200 MG tablet [id number] Yes Take 200 mg by mouth every 6 (six) hours as needed. [provider] Taking Active   Magnesium 250 MG TABS [id number] No Take 250 mg by mouth daily.   Patient not taking: Reported on 12/31/2020   [provider] Not Taking Active Self           Med Note Nicolasa Ducking Oct 21, 2017 10:43 AM)    mirtazapine (REMERON) 15 MG tablet [id number] Yes Take  1 tablet  at Bedtime  for Appetite & Sleep  Patient taking differently: Take  1 tablet  at Bedtime  for Appetite & Sleep   Unk Pinto, MD Taking Active   omeprazole (PRILOSEC) 40 MG capsule [id number] No TAKE 1 CAPSULE DAILY FOR INDIGESTION AND REFLUX  Patient not taking: Reported on 12/31/2020   Vladimir Crofts, PA-C Not Taking Active   potassium chloride (K-DUR,KLOR-CON) 10 MEQ tablet [id number] Yes Take 10-20 mEq by mouth See admin instructions. 59mq in the AM and in the PM [provider] Taking Active   QUEtiapine (SEROQUEL) 25 MG tablet [id number] TAKE 1/2 TO 1 (ONE-HALF TO ONE) TABLET BY MOUTH TWICE DAILY AT  NOON  AND  SUPPERTIME  FOR  ANXIETY  AND  MOOD MMagda Bernheim NP Taking Active   rosuvastatin (CRESTOR) 5 MG tablet [id number] TAKE 1 TABLET DAILY FOR CHOLESTEROL CLiane Comber NP Taking Active   Tiotropium Bromide-Olodaterol (STIOLTO RESPIMAT) 2.5-2.5 MCG/ACT AERS [id number] INHALE 2 PUFFS EVERY DBarkley Boards NP Taking Active   TURMERIC PO [id number] Take by mouth. 2037mdaily (natural product with rosemary, ginger, holy basil, green tea, hu zhang, chinese goldthread, barberry, oregnao, skullcap) [provider] Taking Active   vitamin C (ASCORBIC ACID) 500 MG tablet [id number] Take 500 mg by mouth daily.  Patient not taking: Reported on 12/31/2020   [provider] Not Taking Active Self            Patient Active Problem List   Diagnosis Date Noted   Nocturnal hypoxemia due to obstructive chronic bronchitis (HCG[place]06/20/2022   OSA (obstructive sleep apnea) 09/23/2020   Mild malnutrition (HCPra[place]02/18/2021   Left displaced femoral neck fracture (HCF[place]10/15/2019   Lumbar radiculopathy 01/18/2018   Aortic atherosclerosis (HCPulciferby Chest CTscan on 05/05/2018 01/10/2018   Anemia, chronic disease 12/13/2017   Hypokalemia 0211/17/3567  Follicular lymphoma grade iiia, lymph nodes of head, face, and neck (HCRed[place]11/10/2015   OSA  and COPD overlap syndrome ([place]) 12/05/2014   Osteoporosis 12/05/2014   Medication management 03/06/2014   CKD (chronic kidney disease) stage 3, GFR 30-59 ml/min (HCC) 11/27/2013   COPD (chronic obstructive pulmonary disease) with emphysema ([place]) 11/27/2013   Essential hypertension    GERD     Hx of adenomatous colonic polyps    Hyperlipidemia, mixed    Abnormal glucose    Vitamin D deficiency    RLS (restless legs syndrome)    Vitamin B12 deficiency    Anxiety     Immunization History  Administered Date(s) Administered   Influenza Split 01/18/2013   Influenza, High Dose Seasonal PF 12/05/2014, 01/23/2016, 12/22/2016, 01/11/2018, 12/22/2018   Influenza-Unspecified 01/17/2018   PFIZER(Purple Top)SARS-COV-2 Vaccination 05/11/2019, 06/01/2019   Pneumococcal Conjugate-13 11/27/2013   Pneumococcal Polysaccharide-23 11/19/2011   Td 08/13/2008   Zoster, Live 04/06/2010    Conditions to be addressed/monitored:  Hypertension, Hyperlipidemia, COPD, Chronic Kidney Disease, Depression, Anxiety, and neuropathy, vitamin D deficiency, OSA/CPAP, abnormal glucose, GERD  Care Plan : General Pharmacy (Adult)  Updates made by Rush Landmark, [place] since 12/31/2020 12:00 AM     Problem: Hypertension, Hyperlipidemia, COPD, Chronic Kidney Disease, Depression, Anxiety, and neuropathy, vitamin D deficiency, OSA/CPAP, abnormal glucose, GERD   Priority: High     Long-Range Goal: Patient-Specific Goal   Start Date: 12/31/2020  Expected End Date: 12/31/2021  Priority: High  Note:    Current Barriers:  Unable to achieve control of mental health   Pharmacist Clinical Goal(s):  Patient will achieve adherence to monitoring guidelines and medication adherence to achieve therapeutic efficacy achieve control of anxiety and depression as evidenced by GAD-7/ PHQ-9 and patient responses through collaboration with PharmD  and provider.   Interventions: 1:1 collaboration with Unk Pinto, MD regarding development and update of comprehensive plan of care as evidenced by provider attestation and co-signature Inter-disciplinary care team collaboration (see longitudinal plan of care) Comprehensive medication review performed; medication list updated in electronic medical record  Hypertension (BP goal <130/80) -Controlled -Current treatment: Atenolol 50 mg daily; (appropriate, effective, safe, accessible) -Medications previously tried: N/A  -Current home readings: Checking BID Usually at goal but some elevated readings (patient also has uncontrolled anxiety) 9/24: 132/70; 66          151/67; 69 9/23: 138/76; 67          152/77; 70 -Current dietary habits: Limiting salt -Current exercise habits: Limited due to COPD and neuropathy in both feet but goes for walks with neighbor occasionally -Denies hypotensive/hypertensive symptoms -Educated on BP goals and benefits of medications for prevention of heart attack, stroke and kidney damage; Daily salt intake goal < 2300 mg; Importance of home blood pressure monitoring; Proper BP monitoring technique; Symptoms of hypotension and importance of maintaining adequate hydration; -Counseled to monitor BP at home 16 days per month using RPM BP monitor -Patient has OSA and using a CPAP machine -Counseled on diet and exercise extensively Recommended to continue current medication Counseled on importance of lifestyle modifications  Hyperlipidemia: (LDL goal < 70) -Uncontrolled -Current treatment: Rosuvastatin 5 mg daily (unable to assess) -Medications previously tried: N/A Patient has not been taking medication recently Discussed importance of medication adherence Patient will restart medication  -Current dietary patterns: No major restrictions -Current exercise habits: Limited walking -Educated on Cholesterol goals;  Benefits of statin for ASCVD risk  reduction; Importance of limiting foods high in cholesterol; Exercise goal of 150 minutes per week; -Counseled on diet and exercise extensively Recommended to continue current medication  Abnormal  glucose (A1c goal < 5.7) -Controlled -Current medications: None -Medications previously tried: N/A  -Current home glucose readings Not checking -Denies hypoglycemic/hyperglycemic symptoms -Current meal patterns: no major restrictions -Current exercise: Limited but walks with neighbor occasionally -Educated on A1c and blood sugar goals; Complications of diabetes including kidney damage, retinal damage, and cardiovascular disease; Prevention and management of hypoglycemic episodes; -Counseled to check feet daily and get yearly eye exams -Counseled on diet and exercise extensively  COPD (Goal: control symptoms and prevent exacerbations) -Controlled -Current treatment  Stiolto Respimat 2.5-2.76mg/act; 2 puffs daily (appropriate, effective, safe, accessible) -Medications previously tried: N/A  -Pulmonary function testing: N/A -Exacerbations requiring treatment in last 6 months: None -Patient reports consistent use of maintenance inhaler -Frequency of rescue inhaler use: does not have one Patient occasionally uses Stiolto as rescue inhaler Will consult with PCP and send in RX for albuterol inhaler -Counseled on Proper inhaler technique; Benefits of consistent maintenance inhaler use When to use rescue inhaler Differences between maintenance and rescue inhalers -Recommended to continue current medication Educated on contacting CPP or PCP for any changes in breathing, chest tightness, changes in mucus and when to contact 911  Anxiety / Depression  -Uncontrolled -Current treatment: Mirtazapine 147mHS (unable to assess, patient was not taking medication) Quetiapine 25 mg 1/2 to 1 tablet BID (patient taking 1 tablet daily ((appropriate, query effective) -Medications previously  tried/failed: N/A Patient has trouble falling asleep when anxiety flares Otherwise sleeps plenty throughout the day Using sleep as an escape was discussed and will be monitoring by patient and neighbor -PHQ9: 11 (moderate depression) -GAD7: 12 (moderate anxiety) -Connected with PCP and referred to psychologist for mental health support -Educated on Benefits of medication for symptom control Benefits of cognitive-behavioral therapy with or without medication -Recommended to continue current medication Recommended starting mirtazapine for depression and apetite stimulation. Discussed increasing Seroquel 25 mg from 1 tablet at night to 1/2 tablet midday and 1 tablet at night Collaborated with PCP to refer patient to psychiatrist CCM team to conduct anx/ depression assessment October  Neuropathy  -Not ideally controlled -Current treatment  Herbal supplement containing turmeric -Medications previously tried: Gabapentin (patient did not wish to take medication)  Patient states supplement is helping pain. Numbness still present but patient does not wish to start gabapentin expressing memory loss concerns -Recommended to continue current medication  GERD -Controlled -Current treatment  Omeprazole 40 mg daily (appropriate, effective, safe, accessible) -Medications previously tried: N/A  -Counseled on diet and exercise extensively Recommended to continue current medication    Health Maintenance -Vaccine gaps: Discussed with patient Vitamin D deficiency resolved: 61 - 10/22/2020 Patient was not taking potassium supplement. Discussed restarting medication. -Patient is satisfied with current therapy and denies issues -Recommended to continue current medication  Patient Goals/Self-Care Activities Patient will:  - take medications as prescribed  Follow Up Plan: Face to Face appointment with care management team member scheduled for:  04/22/2021       Medication Assistance: None  required.  Patient affirms current coverage meets needs.  Compliance/Adherence/Medication fill history: Care Gaps: Hepatitis C Screening, Zoster Vaccines- Shingrix, Tetanus/Tdap, Covid 19 vaccine, influenza vaccine  Star-Rating Drugs: Rosuvastatin 23m46mab- 90ds, 09/03/2020-  [facility] Mail Delivery   [facility]  [facility] [address] [id number]: [phone]: [phone] [id number]:  [phone] Fax: [phone]  Uses pill box? No - friend is caregiver and sets up nightly/ AM meds for patient daily to help with compliance Pt endorses 75% compliance  We discussed: Current pharmacy is preferred with insurance plan and patient is satisfied with pharmacy services Patient decided to: Continue current medication management strategy  Care Plan and Follow Up Patient Decision:  Patient agrees to Care Plan and Follow-up.  Plan: Face to Face appointment with care management team member scheduled for: 04/22/2021

## 2020-12-31 NOTE — Patient Instructions (Addendum)
Visit Information   PATIENT GOALS:   Goals Addressed             This Visit's Progress    Track and Manage My Blood Pressure-Hypertension       Timeframe:  Long-Range Goal Priority:  High Start Date:                             Expected End Date:                       Follow Up Date 04/22/2021    - check blood pressure daily - choose a place to take my blood pressure (home, clinic or office, retail store)    Why is this important?   You won't feel high blood pressure, but it can still hurt your blood vessels.  High blood pressure can cause heart or kidney problems. It can also cause a stroke.  Making lifestyle changes like losing a little weight or eating less salt will help.  Checking your blood pressure at home and at different times of the day can help to control blood pressure.  If the doctor prescribes medicine remember to take it the way the doctor ordered.  Call the office if you cannot afford the medicine or if there are questions about it.     Notes:      Track and Manage My Symptoms-Depression       Timeframe:  Long-Range Goal Priority:  High Start Date:                             Expected End Date:                       Follow Up Date 04/22/2021    - avoid negative self-talk - exercise at least 2 to 3 times per week - have a plan for how to handle bad days - journal feelings and what helps to feel better or worse - spend time or talk with others at least 2 to 3 times per week - spend time or talk with others every day - watch for early signs of feeling worse    Why is this important?   Keeping track of your progress will help your treatment team find the right mix of medicine and therapy for you.  Write in your journal every day.  Day-to-day changes in depression symptoms are normal. It may be more helpful to check your progress at the end of each week instead of every day.     Notes:         Consent to CCM Services: Renee Hawkins was given  information about Chronic Care Management services including:  CCM service includes personalized support from designated clinical staff supervised by her physician, including individualized plan of care and coordination with other care providers 24/7 contact phone numbers for assistance for urgent and routine care needs. Service will only be billed when office clinical staff spend 20 minutes or more in a month to coordinate care. Only one practitioner may furnish and bill the service in a calendar month. The patient may stop CCM services at any time (effective at the end of the month) by phone call to the office staff. The patient will be responsible for cost sharing (co-pay) of up to 20% of the service fee (after annual deductible is met).  Patient agreed to services and verbal consent obtained.   Patient verbalizes understanding of instructions provided today and agrees to view in [place].   Face to Face appointment with care management team member scheduled for:  04/22/2021  Signature: Fransico Michael, PharmD Clinical Pharmacist [name] [phone]   CLINICAL CARE PLAN: Patient Care Plan: General Pharmacy (Adult)     Problem Identified: Hypertension, Hyperlipidemia, COPD, Chronic Kidney Disease, Depression, Anxiety, and neuropathy, vitamin D deficiency, OSA/CPAP, abnormal glucose, GERD   Priority: High     Long-Range Goal: Patient-Specific Goal   Start Date: 12/31/2020  Expected End Date: 12/31/2021  Priority: High  Note:    Current Barriers:  Unable to achieve control of mental health   Pharmacist Clinical Goal(s):  Patient will achieve adherence to monitoring guidelines and medication adherence to achieve therapeutic efficacy achieve control of anxiety and depression as evidenced by GAD-7/ PHQ-9 and patient responses through collaboration with PharmD and provider.   Interventions: 1:1 collaboration with Unk Pinto, MD regarding development and  update of comprehensive plan of care as evidenced by provider attestation and co-signature Inter-disciplinary care team collaboration (see longitudinal plan of care) Comprehensive medication review performed; medication list updated in electronic medical record  Hypertension (BP goal <130/80) -Controlled -Current treatment: Atenolol 50 mg daily; (appropriate, effective, safe, accessible) -Medications previously tried: N/A  -Current home readings: Checking BID Usually at goal but some elevated readings (patient also has uncontrolled anxiety) 9/24: 132/70; 66          151/67; 69 9/23: 138/76; 67          152/77; 70 -Current dietary habits: Limiting salt -Current exercise habits: Limited due to COPD and neuropathy in both feet but goes for walks with neighbor occasionally -Denies hypotensive/hypertensive symptoms -Educated on BP goals and benefits of medications for prevention of heart attack, stroke and kidney damage; Daily salt intake goal < 2300 mg; Importance of home blood pressure monitoring; Proper BP monitoring technique; Symptoms of hypotension and importance of maintaining adequate hydration; -Counseled to monitor BP at home 16 days per month using RPM BP monitor -Patient has OSA and using a CPAP machine -Counseled on diet and exercise extensively Recommended to continue current medication Counseled on importance of lifestyle modifications  Hyperlipidemia: (LDL goal < 70) -Uncontrolled -Current treatment: Rosuvastatin 5 mg daily (unable to assess) -Medications previously tried: N/A Patient has not been taking medication recently Discussed importance of medication adherence Patient will restart medication  -Current dietary patterns: No major restrictions -Current exercise habits: Limited walking -Educated on Cholesterol goals;  Benefits of statin for ASCVD risk reduction; Importance of limiting foods high in cholesterol; Exercise goal of 150 minutes per  week; -Counseled on diet and exercise extensively Recommended to continue current medication  Abnormal glucose (A1c goal < 5.7) -Controlled -Current medications: None -Medications previously tried: N/A  -Current home glucose readings Not checking -Denies hypoglycemic/hyperglycemic symptoms -Current meal patterns: no major restrictions -Current exercise: Limited but walks with neighbor occasionally -Educated on A1c and blood sugar goals; Complications of diabetes including kidney damage, retinal damage, and cardiovascular disease; Prevention and management of hypoglycemic episodes; -Counseled to check feet daily and get yearly eye exams -Counseled on diet and exercise extensively  COPD (Goal: control symptoms and prevent exacerbations) -Controlled -Current treatment  Stiolto Respimat 2.5-2.42mg/act; 2 puffs daily (appropriate, effective, safe, accessible) -Medications previously tried: N/A  -Pulmonary function testing: N/A -Exacerbations requiring treatment in last 6 months: None -Patient reports consistent use of maintenance inhaler -Frequency of rescue inhaler use:  does not have one Patient occasionally uses Stiolto as rescue inhaler Will consult with PCP and send in RX for albuterol inhaler -Counseled on Proper inhaler technique; Benefits of consistent maintenance inhaler use When to use rescue inhaler Differences between maintenance and rescue inhalers -Recommended to continue current medication Educated on contacting CPP or PCP for any changes in breathing, chest tightness, changes in mucus and when to contact 911  Anxiety / Depression  -Uncontrolled -Current treatment: Mirtazapine 57m HS (unable to assess, patient was not taking medication) Quetiapine 25 mg 1/2 to 1 tablet BID (patient taking 1 tablet daily ((appropriate, query effective) -Medications previously tried/failed: N/A Patient has trouble falling asleep when anxiety flares Otherwise sleeps plenty  throughout the day Using sleep as an escape was discussed and will be monitoring by patient and neighbor -PHQ9: 11 (moderate depression) -GAD7: 12 (moderate anxiety) -Connected with PCP and referred to psychologist for mental health support -Educated on Benefits of medication for symptom control Benefits of cognitive-behavioral therapy with or without medication -Recommended to continue current medication Recommended starting mirtazapine for depression and apetite stimulation. Discussed increasing Seroquel 25 mg from 1 tablet at night to 1/2 tablet midday and 1 tablet at night Collaborated with PCP to refer patient to psychiatrist CCM team to conduct anx/ depression assessment October  Neuropathy  -Not ideally controlled -Current treatment  Herbal supplement containing turmeric -Medications previously tried: Gabapentin (patient did not wish to take medication)  Patient states supplement is helping pain. Numbness still present but patient does not wish to start gabapentin expressing memory loss concerns -Recommended to continue current medication  GERD -Controlled -Current treatment  Omeprazole 40 mg daily (appropriate, effective, safe, accessible) -Medications previously tried: N/A  -Counseled on diet and exercise extensively Recommended to continue current medication    Health Maintenance -Vaccine gaps: Discussed with patient Vitamin D deficiency resolved: 61 - 10/22/2020 Patient was not taking potassium supplement. Discussed restarting medication. -Patient is satisfied with current therapy and denies issues -Recommended to continue current medication  Patient Goals/Self-Care Activities Patient will:  - take medications as prescribed  Follow Up Plan: Face to Face appointment with care management team member scheduled for:  04/22/2021

## 2020-12-31 NOTE — Progress Notes (Signed)
    Chronic Care Management Pharmacy Assistant   Name: Renee Hawkins  MRN: [id number] DOB: Nov 05, 1940   Reason for Encounter: CCM Initial Visit with Dolphus Jenny 9/27   Conditions to be addressed/monitored: HTN, HLD, COPD, CKD Stage 3, Anxiety, and OSA, GERD, Osteoporosis, Follicular lymphoma, Vitamin D deficiency,     Recent office visits:  12/10/2020- Dr. Melford Aase- Patient was seen for hypertension and neuropathy. Start Mirtaapine 15mg - take 1 at bedtime for appetite and sleep.   11/25/2020- Dr. Melford Aase- Patient was seen for hypertension, neuropathy, and anxiety and depression. Started Atenonol 50mg - 1 tablet every morning, Quetiapine fumarate 25mg - take 1/2 to 1 tab 2x a day at noon and suppertime for anxiety & mood. Change Gabapentin 100mg - take 1 capsule 3x a day with breakfast, supper, and bedtime for neuropathy pain. Stop dexamethasone 4mg .   10/22/2020- Dr. Melford Aase- Patient was seen for an adult medical examination. Started Cyclobenazaprine HCl 10mg  - take 1/2 to 1 tab 3x a day as needed for muscle spasm and dexamethsone 4mg - take 1 tab 3x a day- 3days, then 2x day- 3days, then 1 tab daily.   Recent consult visits:  None  Hospital visits:  None in previous 6 months  Medications: Outpatient Encounter Medications as of 12/31/2020  Medication Sig   aspirin EC 81 MG tablet Take 81 mg by mouth daily.   atenolol (TENORMIN) 50 MG tablet TAKE 1 TABLET BY MOUTH ONCE DAILY IN THE MORNING FOR BLOOD PRESSURE   Coenzyme Q10 10 MG capsule Take 10 mg by mouth 2 (two) times daily.   cyclobenzaprine (FLEXERIL) 10 MG tablet Take 1/2 to 1 tablet 3 x /day as needed for Muscle Spasm   gabapentin (NEURONTIN) 100 MG capsule Take 1 capsule 3 x /day with Breakfast , Supper & Bedtime for Neuropathy Pain (Patient not taking: Reported on 12/10/2020)   Magnesium 250 MG TABS Take 250 mg by mouth daily.    mirtazapine (REMERON) 15 MG tablet Take  1 tablet  at Bedtime  for Appetite & Sleep   omeprazole (PRILOSEC) 40  MG capsule TAKE 1 CAPSULE DAILY FOR INDIGESTION AND REFLUX   potassium chloride (K-DUR,KLOR-CON) 10 MEQ tablet Take 10-20 mEq by mouth See admin instructions. 29mEq in the AM and in the PM   QUEtiapine (SEROQUEL) 25 MG tablet TAKE 1/2 TO 1 (ONE-HALF TO ONE) TABLET BY MOUTH TWICE DAILY AT  NOON  AND  SUPPERTIME  FOR  ANXIETY  AND  MOOD   rosuvastatin (CRESTOR) 5 MG tablet TAKE 1 TABLET DAILY FOR CHOLESTEROL   Tiotropium Bromide-Olodaterol (STIOLTO RESPIMAT) 2.5-2.5 MCG/ACT AERS INHALE 2 PUFFS EVERY DAY   vitamin C (ASCORBIC ACID) 500 MG tablet Take 500 mg by mouth daily.   No facility-administered encounter medications on file as of 12/31/2020.    Care Gaps: Hepatitis C Screening, Zoster Vaccines- Shingrix, Tetanus/Tdap, Covid 19 vaccine, influenza vaccine  Star Rating Drugs: Rosuvastatin 5mg  tab- 90ds, 09/03/2020-  [facility]     Please bring medications and supplements to appointment  12/31/2020- Unable to reach patient for reminder of appt.   SIG: Set designer, Health Concierge

## 2021-01-06 ENCOUNTER — Institutional Professional Consult (permissible substitution): Payer: Medicare HMO | Admitting: Neurology

## 2021-01-17 ENCOUNTER — Other Ambulatory Visit: Payer: Self-pay | Admitting: Nurse Practitioner

## 2021-01-17 DIAGNOSIS — I10 Essential (primary) hypertension: Secondary | ICD-10-CM

## 2021-02-03 DIAGNOSIS — I10 Essential (primary) hypertension: Secondary | ICD-10-CM | POA: Diagnosis not present

## 2021-02-03 DIAGNOSIS — J439 Emphysema, unspecified: Secondary | ICD-10-CM | POA: Diagnosis not present

## 2021-02-06 ENCOUNTER — Inpatient Hospital Stay: Payer: Medicare HMO | Admitting: Hematology and Oncology

## 2021-02-06 ENCOUNTER — Telehealth: Payer: Self-pay

## 2021-02-06 ENCOUNTER — Inpatient Hospital Stay: Payer: Medicare HMO

## 2021-02-06 NOTE — Telephone Encounter (Signed)
Called regarding miss appts today at the office. Spoke with a family member. She forgot about the appts. Sent a scheduling message to reschedule.

## 2021-02-17 ENCOUNTER — Telehealth: Payer: Self-pay | Admitting: *Deleted

## 2021-02-17 ENCOUNTER — Inpatient Hospital Stay: Payer: Medicare HMO | Admitting: Hematology and Oncology

## 2021-02-17 ENCOUNTER — Inpatient Hospital Stay: Payer: Medicare HMO | Attending: Internal Medicine

## 2021-02-17 NOTE — Telephone Encounter (Signed)
Contacted patient cell# - "MB Full" Contacted patient home# - reached patient. She said she either did not know or did not remember she had appt today. She asked to r/s and asked that scheduling call home # and leave message if she does not answer.  Schedule message sent.

## 2021-02-18 ENCOUNTER — Telehealth: Payer: Self-pay | Admitting: Hematology and Oncology

## 2021-02-18 NOTE — Telephone Encounter (Signed)
Scheduled per sch msg. Called and left msg  

## 2021-02-25 ENCOUNTER — Inpatient Hospital Stay: Payer: Medicare HMO

## 2021-02-25 ENCOUNTER — Inpatient Hospital Stay: Payer: Medicare HMO | Admitting: Hematology and Oncology

## 2021-03-17 ENCOUNTER — Encounter: Payer: Self-pay | Admitting: Neurology

## 2021-03-17 ENCOUNTER — Institutional Professional Consult (permissible substitution): Payer: Medicare HMO | Admitting: Neurology

## 2021-03-31 ENCOUNTER — Other Ambulatory Visit: Payer: Self-pay | Admitting: Internal Medicine

## 2021-04-01 ENCOUNTER — Other Ambulatory Visit: Payer: Self-pay | Admitting: Internal Medicine

## 2021-04-01 DIAGNOSIS — E782 Mixed hyperlipidemia: Secondary | ICD-10-CM

## 2021-04-01 MED ORDER — ROSUVASTATIN CALCIUM 5 MG PO TABS: ORAL_TABLET | ORAL | 3 refills | Status: DC

## 2021-04-21 ENCOUNTER — Telehealth: Payer: Self-pay

## 2021-04-21 NOTE — Telephone Encounter (Signed)
Called patient to confirm visit with CPP, Unable to reach patient. Patients voicemail box if full and cannot leave a message at this time. LM, HC

## 2021-04-22 ENCOUNTER — Ambulatory Visit: Payer: Medicare HMO | Admitting: Pharmacist

## 2021-04-23 ENCOUNTER — Telehealth: Payer: Self-pay

## 2021-04-23 NOTE — Telephone Encounter (Signed)
Called patient to see if she would like to r/s CPP visit.VM box is full, unable to leave message.

## 2021-04-25 NOTE — Progress Notes (Signed)
FOLLOW UP 6 MONTHS  Assessment:    Essential hypertension - continue medications, DASH diet, exercise and monitor at home. Call if greater than 130/80.  - CBC with Differential/Platelet - CMP/GFR  Memory Deficit Continue word games Strongly encouraged to stop driving Continue monitoring closely Refuses medication, tried Namenda in past but caused too much drowsiness  Hyperlipidemia -continue medications, check lipids, decrease fatty foods, increase activity.  - Lipid panel  Other abnormal glucose  Recent A1Cs at goal Discussed diet/exercise, weight management  A1C; check CMP  Vitamin D deficiency Continue supplementation to maintain goal of 70-100  OSA and COPD overlap syndrome (HCC) Continue CPAP, using occasionally when remembers Helping with daytime fatigue Weight loss still advised Discussed mask & tubing hygeine Referral made to Dr. Brett Fairy for sleep study  Pulmonary emphysema, unspecified emphysema type Follow up pulmonary, continue inhaler. Did not find Stiolto helped breathing Breztri sample given , if helps will call in prescription  CKD (chronic kidney disease) stage 3 Increase fluids  Avoid NSAIDS Blood pressure control Monitor sugars  Will continue to monitor - CMP WITH GFR  Osteoporosis Can not tolerate fosamax Encourage weight bearing exercises Vitamin D supplement encouraged  Atherosclerosis of aorta Per CT 05/05/2018 Control blood pressure, lipids and glucose Disscused lifestyle modifications, diet & exercise Continue to monitor   Medication management - Magnesium  BMI less than 16.5 Mild malnutrition (HCC) Strongly encouraged to increase caloric and protein intake Has Ensure at home but has not been using Encouraged Ensure daily  URI Dexamethasone 1 mg 3 tabs x 3, 2 tabs x 3 and 1 tab x 5  Zpak as directed Albuterol inhaler 2 puffs twice a day for the next 5 days If develops fever over 101 or cough worsens call the office and  will order a chest xray  Further disposition pending results if labs check today. Discussed med's effects and SE's.   Over 30 minutes of face to face interview, exam, counseling, chart review, and critical decision making was performed.    Future Appointments  Date Time Provider [place]  06/17/2021  2:30 PM Magda Bernheim, NP [facility] None  10/22/2021  2:00 PM Unk Pinto, MD [facility] None    .  Subjective:   Renee Hawkins is a 81 y.o. female who presents for follow up.   She lives by herself with her little shitzu dog, Fredderick Severance She occasionally walks with a neighbor.  Approx 6 days ago she had a sore throat , fever of 102 and developed a productive cough of white mucus. Fever resolved after 3 days. She has not been taking her stiolto inhaler- did not notice a difference and stopped taking.  Does not use her Albuterol inhaler.  Patient is s/p treatment follicular lymphoma, right neck, following oncology Dr. Alvy Bimler.   Dr. Felix Ahmadi, OD earlier this year had abnormal left optic nerve, had negative CBC, ESR, CRP and negative MRI of brain and orbits.   She has COPD with OSA, on CPAP and respimet, trelegy was not as effective and cost more, wears CPAP nightly, 6-8 hours, states it is helping with breathing and energy, well controlled. Follows with Dr. Welford Roche PRN.   She is still driving, no anxiety with driving. She only drives short distances. MMSE 18/30  BMI is Body mass index is 16.27 kg/m., she has been working on diet and exercise. She is going to try to eat more frequently. She will eat breakfast- cake or donut, lunch- soup or sandwich and same for dinner.  Wt Readings from Last 3 Encounters:  04/29/21 90 lb 6.4 oz (41 kg)  12/10/20 99 lb 12.8 oz (45.3 kg)  11/25/20 100 lb 12.8 oz (45.7 kg)   Her blood pressure has been controlled at home, today their BP is BP: 132/60 BP Readings from Last 3 Encounters:  04/29/21 132/60  12/10/20 140/70  11/25/20 (!) 154/80     She does not workout,  Will walk throughout the house. Her neighbor lives across the street. She denies chest pain, shortness of breath, dizziness.   She is on cholesterol medication, she is not on a cholesterol medication. Her cholesterol is not at goal. The cholesterol last visit was:   Lab Results  Component Value Date   CHOL 208 (H) 10/22/2020   HDL 57 10/22/2020   LDLCALC 118 (H) 10/22/2020   TRIG 210 (H) 10/22/2020   CHOLHDL 3.6 10/22/2020   Lab Results  Component Value Date   GFRNONAA 73 08/28/2020   She has been working on diet and exercise for glucose management, and denies nausea, paresthesia of the feet and polydipsia. Last A1C in the office was:  Lab Results  Component Value Date   HGBA1C 5.5 10/22/2020   Patient is on Vitamin D supplement.   Lab Results  Component Value Date   VD25OH 61 10/22/2020     Lab Results  Component Value Date   GFRNONAA 73 08/28/2020      Medication Review   Current Outpatient Medications (Cardiovascular):    atenolol (TENORMIN) 50 MG tablet, Take 1 tablet  Daily for BP                                                    /                         TAKE 1 TABLET BY MOUTH ONCE DAILY IN THE MORNING FOR BLOOD PRESSURE   rosuvastatin (CRESTOR) 5 MG tablet, Take  1 tablet  Daily  for Cholesterol  Current Outpatient Medications (Respiratory):    Tiotropium Bromide-Olodaterol (STIOLTO RESPIMAT) 2.5-2.5 MCG/ACT AERS, INHALE 2 PUFFS EVERY DAY   albuterol (VENTOLIN HFA) 108 (90 Base) MCG/ACT inhaler, Use  2 inhalations  15 minutes  apart every 4 hours  as needed to rescue Asthma  Current Outpatient Medications (Analgesics):    aspirin EC 81 MG tablet, Take 81 mg by mouth daily.   ibuprofen (ADVIL) 200 MG tablet, Take 200 mg by mouth every 6 (six) hours as needed.   Current Outpatient Medications (Other):    Ascorbic Acid (VITAMIN C) 1000 MG tablet, Take 1,000 mg by mouth daily.   BLACK ELDERBERRY PO, Take by mouth.   Cholecalciferol  (VITAMIN D) 50 MCG (2000 UT) CAPS, Take by mouth.   Homeopathic Products (ZICAM COLD REMEDY PO), Take by mouth.   potassium chloride (K-DUR,KLOR-CON) 10 MEQ tablet, Take 10-20 mEq by mouth See admin instructions. 76mq in the AM and in the PM   QUEtiapine (SEROQUEL) 25 MG tablet, TAKE 1/2 TO 1 (ONE-HALF TO ONE) TABLET BY MOUTH TWICE DAILY AT  NOON  AND  SUPPERTIME  FOR  ANXIETY  AND  MOOD (Patient not taking: Reported on 04/29/2021)   TURMERIC PO, Take by mouth. 2077mdaily (natural product with rosemary, ginger, holy basil, green tea, hu zhang, chinese goldthread, barberry,  oregnao, skullcap) (Patient not taking: Reported on 04/29/2021)  Current Problems (verified) Patient Active Problem List   Diagnosis Date Noted   Nocturnal hypoxemia due to obstructive chronic bronchitis ([place]) 09/23/2020   OSA (obstructive sleep apnea) 09/23/2020   Mild malnutrition ([place]) 05/25/2019   Left displaced femoral neck fracture ([place]) 01/18/2018   Lumbar radiculopathy 01/18/2018   Aortic atherosclerosis ([place]) by Chest CTscan on 05/05/2018 01/10/2018   Anemia, chronic disease 12/13/2017   Hypokalemia 86/76/1950   Follicular lymphoma grade iiia, lymph nodes of head, face, and neck ([place]) 02/11/2016   OSA and COPD overlap syndrome ([place]) 12/05/2014   Osteoporosis 12/05/2014   Medication management 03/06/2014   CKD (chronic kidney disease) stage 3, GFR 30-59 ml/min ([place]) 11/27/2013   COPD (chronic obstructive pulmonary disease) with emphysema ([place]) 11/27/2013   Essential hypertension    GERD     Hx of adenomatous colonic polyps    Hyperlipidemia, mixed    Abnormal glucose    Vitamin D deficiency    RLS (restless legs syndrome)    Vitamin B12 deficiency    Anxiety    Allergies No Known Allergies  SURGICAL HISTORY She  has a past surgical history that includes Appendectomy; Tonsillectomy; Colonoscopy (N/A, 05/18/2014); Lymph node biopsy (Right, 02/04/2016); Direct laryngoscopy (Right, 02/04/2016); Total hip  arthroplasty (Left, 10/21/2017); and Cataract extraction, bilateral (2017). FAMILY HISTORY Her family history includes Stroke in her maternal grandmother and mother. SOCIAL HISTORY She  reports that she quit smoking about 9 years ago. Her smoking use included cigarettes. She has a 30.00 pack-year smoking history. She has never used smokeless tobacco. She reports that she does not drink alcohol and does not use drugs.  Review of Systems  Constitutional: Negative.  Negative for chills, fever and weight loss.  HENT: Negative.  Negative for congestion and hearing loss.        + Snoring, better on CPAP  Eyes:  Negative for blurred vision and double vision.  Respiratory:  Negative for cough, hemoptysis, sputum production, shortness of breath (improved with CPAP, not using O2 anymore) and wheezing.   Cardiovascular: Negative.  Negative for chest pain, palpitations, orthopnea and leg swelling.  Gastrointestinal:  Negative for abdominal pain, blood in stool, constipation, diarrhea, heartburn, melena, nausea and vomiting.  Genitourinary:  Negative for dysuria, flank pain, frequency, hematuria and urgency.  Musculoskeletal: Negative.  Negative for falls, joint pain and myalgias.  Skin: Negative.  Negative for rash.  Neurological:  Positive for sensory change (bilateral legs). Negative for dizziness, tingling, tremors, speech change, focal weakness, seizures, loss of consciousness and headaches.  Psychiatric/Behavioral:  Positive for memory loss. Negative for depression, hallucinations, substance abuse and suicidal ideas. The patient is not nervous/anxious and does not have insomnia.    Objective:     Blood pressure 132/60, pulse 73, temperature (!) 97.5 F (36.4 C), weight 90 lb 6.4 oz (41 kg), SpO2 92 %. Body mass index is 16.27 kg/m.  General appearance: alert, no distress, WD/WN, thin, frail female HEENT: normocephalic, sclerae anicteric, TMs pearly, nares patent, no discharge or erythema, pharynx  normal Oral cavity: MMM, no lesions Neck: supple,  no thyromegaly, no masses Heart: RRR, normal S1, S2, no murmurs Lungs: CTA bilaterally, no wheezes, rhonchi, or rales Abdomen: +bs, soft, non tender, no rebound, distended, no masses, no hepatomegaly, no splenomegaly Musculoskeletal: nontender, no swelling, no obvious deformity Extremities: no edema, no cyanosis, no clubbing Pulses: 2+ symmetric, upper and lower extremities, normal cap refill Neurological: alert, oriented x 3, CN2-12 intact, strength  normal upper extremities and lower extremities,DTRs 2+ throughout, no cerebellar signs, gait slow, antalgic Psychiatric: normal affect, behavior normal, pleasant   Magda Bernheim, NP   04/29/2021

## 2021-04-29 ENCOUNTER — Ambulatory Visit (INDEPENDENT_AMBULATORY_CARE_PROVIDER_SITE_OTHER): Payer: Medicare HMO | Admitting: Nurse Practitioner

## 2021-04-29 ENCOUNTER — Other Ambulatory Visit: Payer: Self-pay

## 2021-04-29 ENCOUNTER — Encounter: Payer: Self-pay | Admitting: Nurse Practitioner

## 2021-04-29 VITALS — BP 132/60 | HR 73 | Temp 97.5°F | Wt 90.4 lb

## 2021-04-29 DIAGNOSIS — J449 Chronic obstructive pulmonary disease, unspecified: Secondary | ICD-10-CM

## 2021-04-29 DIAGNOSIS — E782 Mixed hyperlipidemia: Secondary | ICD-10-CM

## 2021-04-29 DIAGNOSIS — I700 Atherosclerosis of aorta: Secondary | ICD-10-CM | POA: Diagnosis not present

## 2021-04-29 DIAGNOSIS — E559 Vitamin D deficiency, unspecified: Secondary | ICD-10-CM | POA: Diagnosis not present

## 2021-04-29 DIAGNOSIS — N183 Chronic kidney disease, stage 3 unspecified: Secondary | ICD-10-CM

## 2021-04-29 DIAGNOSIS — R413 Other amnesia: Secondary | ICD-10-CM

## 2021-04-29 DIAGNOSIS — M810 Age-related osteoporosis without current pathological fracture: Secondary | ICD-10-CM

## 2021-04-29 DIAGNOSIS — R7309 Other abnormal glucose: Secondary | ICD-10-CM | POA: Diagnosis not present

## 2021-04-29 DIAGNOSIS — E441 Mild protein-calorie malnutrition: Secondary | ICD-10-CM | POA: Diagnosis not present

## 2021-04-29 DIAGNOSIS — J439 Emphysema, unspecified: Secondary | ICD-10-CM

## 2021-04-29 DIAGNOSIS — I10 Essential (primary) hypertension: Secondary | ICD-10-CM | POA: Diagnosis not present

## 2021-04-29 DIAGNOSIS — Z79899 Other long term (current) drug therapy: Secondary | ICD-10-CM

## 2021-04-29 DIAGNOSIS — J069 Acute upper respiratory infection, unspecified: Secondary | ICD-10-CM

## 2021-04-29 DIAGNOSIS — Z681 Body mass index (BMI) 19 or less, adult: Secondary | ICD-10-CM

## 2021-04-29 DIAGNOSIS — G4733 Obstructive sleep apnea (adult) (pediatric): Secondary | ICD-10-CM | POA: Diagnosis not present

## 2021-04-29 MED ORDER — DEXAMETHASONE 1 MG PO TABS: ORAL_TABLET | ORAL | 0 refills | Status: DC

## 2021-04-29 MED ORDER — AZITHROMYCIN 250 MG PO TABS: ORAL_TABLET | ORAL | 1 refills | Status: DC

## 2021-04-29 MED ORDER — ALBUTEROL SULFATE HFA 108 (90 BASE) MCG/ACT IN AERS
2.0000 | INHALATION_SPRAY | Freq: Four times a day (QID) | RESPIRATORY_TRACT | 2 refills | Status: DC | PRN
Start: 2021-04-29 — End: 2022-03-03

## 2021-04-29 NOTE — Patient Instructions (Signed)
Protein-Energy Malnutrition Protein-energy malnutrition is when a person does not eat enough protein, fat, and calories. When this happens over time, it can lead to severe loss of muscle tissue (muscle wasting). This condition also affects the body's defense system (immune system) and can lead to other health problems. What are the causes? This condition may be caused by: Not eating enough protein, fat, or calories. Having certain chronic medical conditions. Eating too little. What increases the risk? The following factors may make you more likely to develop this condition: Living in poverty. Long-term hospitalization. Alcohol or drug dependency. Addiction often leads to a lifestyle in which proper diet is ignored. Dependency can also hurt the metabolism and the body's ability to absorb nutrients. Eating disorders, such as anorexia nervosa or bulimia. Chewing or swallowing problems. People with these disorders may not eat enough. Having certain conditions, such as: Inflammatory bowel disease. Inflammation of the intestines makes it difficult for the body to absorb nutrients. Cancer or AIDS. These diseases can cause a loss of appetite. Chronic heart failure. This interferes with how the body uses nutrients. Cystic fibrosis. This disease can make it difficult for the body to absorb nutrients. Eating a diet that extremely restricts protein, fat, or calorie intake. What are the signs or symptoms? Symptoms of this condition include: Tiredness (fatigue). Weakness. Dizziness. Fainting. Weight loss. Loss of muscle tone and muscle mass. Poor immune response. Lack of menstruation. Poor memory. Hair loss. Skin changes. How is this diagnosed? This condition may be diagnosed based on: Your medical and dietary history. A physical exam. This may include a measurement of your body mass index. Blood tests. How is this treated? This condition may be managed with: Nutrition therapy. This may  include working with a dietitian. Treatment for underlying conditions. People with severe protein-energy malnutrition may need to be treated in a hospital. This may involve receiving nutrition and fluids through an IV. Follow these instructions at home:  Eat a balanced diet. In each meal, include at least one food that is high in protein. Foods that are high in protein include: Meat. Poultry. Fish. Eggs. Cheese. Milk. Beans. Nuts. Eat nutrient-rich foods that are easy to swallow and digest, such as: Fruit and yogurt smoothies. Oatmeal with nut butter. Nutrition supplement drinks. Try to eat six small meals each day instead of three large meals. Take vitamin and protein supplements as told by your health care provider or dietitian. Follow your health care provider's recommendations about exercise and activity. Keep all follow-up visits. This is important. Contact a health care provider if: You have increased weakness or fatigue. You faint. You are a woman and you stop having your period (menstruating). You have rapid hair loss. You have unexpected weight loss. You have diarrhea. You have nausea and vomiting. Get help right away if: You have difficulty breathing. You have chest pain. These symptoms may represent a serious problem that is an emergency. Do not wait to see if the symptoms will go away. Get medical help right away. Call your local emergency services (911 in the [place] Do not drive yourself to the hospital. Summary Protein-energy malnutrition is when a person does not eat enough protein, fat, and calories. Protein-energy malnutrition can lead to severe loss of muscle tissue (muscle wasting). This condition also affects the body's defense system (immune system) and can lead to other health problems. Talk with your health care provider about treatment for this condition. Effective treatment depends on the underlying cause of the malnutrition. This information is not    intended to replace advice given to you by your health care provider. Make sure you discuss any questions you have with your health care provider. Document Revised: 03/23/2020 Document Reviewed: 03/23/2020 Elsevier Patient Education  2022 Elsevier Inc.  

## 2021-04-30 LAB — CBC WITH DIFFERENTIAL/PLATELET
Absolute Monocytes: 359 cells/uL (ref 200–950)
Basophils Absolute: 18 cells/uL (ref 0–200)
Basophils Relative: 0.4 %
Eosinophils Absolute: 78 cells/uL (ref 15–500)
Eosinophils Relative: 1.7 %
HCT: 39 % (ref 35.0–45.0)
Hemoglobin: 12.9 g/dL (ref 11.7–15.5)
Lymphs Abs: 1256 cells/uL (ref 850–3900)
MCH: 32 pg (ref 27.0–33.0)
MCHC: 33.1 g/dL (ref 32.0–36.0)
MCV: 96.8 fL (ref 80.0–100.0)
MPV: 11.1 fL (ref 7.5–12.5)
Monocytes Relative: 7.8 %
Neutro Abs: 2889 cells/uL (ref 1500–7800)
Neutrophils Relative %: 62.8 %
Platelets: 208 10*3/uL (ref 140–400)
RBC: 4.03 10*6/uL (ref 3.80–5.10)
RDW: 12.3 % (ref 11.0–15.0)
Total Lymphocyte: 27.3 %
WBC: 4.6 10*3/uL (ref 3.8–10.8)

## 2021-04-30 LAB — COMPLETE METABOLIC PANEL WITH GFR
AG Ratio: 1.9 (calc) (ref 1.0–2.5)
ALT: 10 U/L (ref 6–29)
AST: 18 U/L (ref 10–35)
Albumin: 4.1 g/dL (ref 3.6–5.1)
Alkaline phosphatase (APISO): 73 U/L (ref 37–153)
BUN: 12 mg/dL (ref 7–25)
CO2: 38 mmol/L — ABNORMAL HIGH (ref 20–32)
Calcium: 9.4 mg/dL (ref 8.6–10.4)
Chloride: 98 mmol/L (ref 98–110)
Creat: 0.89 mg/dL (ref 0.60–1.00)
Globulin: 2.2 g/dL (calc) (ref 1.9–3.7)
Glucose, Bld: 82 mg/dL (ref 65–99)
Potassium: 3.7 mmol/L (ref 3.5–5.3)
Sodium: 144 mmol/L (ref 135–146)
Total Bilirubin: 0.3 mg/dL (ref 0.2–1.2)
Total Protein: 6.3 g/dL (ref 6.1–8.1)
eGFR: 66 mL/min/{1.73_m2} (ref >=60)

## 2021-04-30 LAB — LIPID PANEL
Cholesterol: 114 mg/dL (ref <200)
HDL: 52 mg/dL (ref >=50)
LDL Cholesterol (Calc): 42 mg/dL (calc)
Non-HDL Cholesterol (Calc): 62 mg/dL (calc) (ref <130)
Total CHOL/HDL Ratio: 2.2 (calc) (ref <5.0)
Triglycerides: 123 mg/dL (ref <150)

## 2021-04-30 LAB — HEMOGLOBIN A1C
Hgb A1c MFr Bld: 5.5 % of total Hgb (ref <5.7)
Mean Plasma Glucose: 111 mg/dL
eAG (mmol/L): 6.2 mmol/L

## 2021-05-01 ENCOUNTER — Other Ambulatory Visit: Payer: Self-pay | Admitting: Nurse Practitioner

## 2021-05-01 DIAGNOSIS — J439 Emphysema, unspecified: Secondary | ICD-10-CM

## 2021-05-01 DIAGNOSIS — G4733 Obstructive sleep apnea (adult) (pediatric): Secondary | ICD-10-CM

## 2021-05-01 MED ORDER — BREZTRI AEROSPHERE 160-9-4.8 MCG/ACT IN AERO: 2.0000 | INHALATION_SPRAY | Freq: Two times a day (BID) | RESPIRATORY_TRACT | 3 refills | Status: DC

## 2021-05-02 ENCOUNTER — Telehealth: Payer: Self-pay

## 2021-05-02 NOTE — Telephone Encounter (Signed)
Called patient to r/s visit w/ CPP. Patient did not answer, unable to leave VM d/t VM box being full.   Total time spent: 2 minutes  Vanetta Shawl, [facility]

## 2021-05-06 ENCOUNTER — Telehealth: Payer: Self-pay

## 2021-05-06 NOTE — Telephone Encounter (Signed)
Attempted to call pt. To r/s initial visit w/ CPP. Phone was answered but no one would speak. After many attempts to talk to patient, I was unsuccessful.   Total time spent: 3 minutes Vanetta Shawl, [facility]

## 2021-05-08 ENCOUNTER — Telehealth: Payer: Self-pay

## 2021-05-08 NOTE — Telephone Encounter (Signed)
Called patient to r/s visit w/ CPP. Patient did not answer. Unable to leave vm.  Total time spent: 1 minute

## 2021-05-22 ENCOUNTER — Telehealth: Payer: Self-pay

## 2021-05-22 NOTE — Telephone Encounter (Signed)
3rd attempt at calling patient to r/s initial visit w/ CPP. Unable to reach pt. Unable to LVM d/t VM box being full.  Total time spent: 2 Minutes Vanetta Shawl, [facility]

## 2021-06-03 ENCOUNTER — Ambulatory Visit: Payer: Medicare HMO | Admitting: Pharmacist

## 2021-06-17 ENCOUNTER — Ambulatory Visit: Payer: Medicare HMO | Admitting: Nurse Practitioner

## 2021-08-05 ENCOUNTER — Encounter: Payer: Self-pay | Admitting: Nurse Practitioner

## 2021-08-05 ENCOUNTER — Ambulatory Visit (INDEPENDENT_AMBULATORY_CARE_PROVIDER_SITE_OTHER): Payer: Medicare HMO | Admitting: Nurse Practitioner

## 2021-08-05 VITALS — BP 138/82 | HR 84 | Temp 97.7°F | Wt 93.6 lb

## 2021-08-05 DIAGNOSIS — I10 Essential (primary) hypertension: Secondary | ICD-10-CM

## 2021-08-05 DIAGNOSIS — Z79899 Other long term (current) drug therapy: Secondary | ICD-10-CM

## 2021-08-05 DIAGNOSIS — D638 Anemia in other chronic diseases classified elsewhere: Secondary | ICD-10-CM

## 2021-08-05 DIAGNOSIS — M5416 Radiculopathy, lumbar region: Secondary | ICD-10-CM

## 2021-08-05 DIAGNOSIS — N183 Chronic kidney disease, stage 3 unspecified: Secondary | ICD-10-CM

## 2021-08-05 DIAGNOSIS — R7309 Other abnormal glucose: Secondary | ICD-10-CM

## 2021-08-05 DIAGNOSIS — E559 Vitamin D deficiency, unspecified: Secondary | ICD-10-CM

## 2021-08-05 DIAGNOSIS — K2100 Gastro-esophageal reflux disease with esophagitis, without bleeding: Secondary | ICD-10-CM

## 2021-08-05 DIAGNOSIS — Z0000 Encounter for general adult medical examination without abnormal findings: Secondary | ICD-10-CM

## 2021-08-05 DIAGNOSIS — I700 Atherosclerosis of aorta: Secondary | ICD-10-CM

## 2021-08-05 DIAGNOSIS — E782 Mixed hyperlipidemia: Secondary | ICD-10-CM

## 2021-08-05 DIAGNOSIS — C8231 Follicular lymphoma grade IIIa, lymph nodes of head, face, and neck: Secondary | ICD-10-CM

## 2021-08-05 DIAGNOSIS — G4733 Obstructive sleep apnea (adult) (pediatric): Secondary | ICD-10-CM

## 2021-08-05 DIAGNOSIS — M810 Age-related osteoporosis without current pathological fracture: Secondary | ICD-10-CM

## 2021-08-05 NOTE — Progress Notes (Signed)
MEDICARE ANNUAL WELLNESS AND FOLLOW UP 6 MONTH ? ?Assessment:  ? ?1. Encounter for Medicare annual wellness exam ?Due Annually  ? ?2. Essential hypertension ?No medications, DASH diet, exercise and monitor at home. Call if greater than 130/80.  ? ?3. Aortic atherosclerosis ([place]) by Chest CTscan on 05/05/2018 ?Per CT 05/05/2018 ?Control blood pressure, lipids and glucose ? ?4. OSA and COPD overlap syndrome ([place]) ?Continue CPAP, using nightly for at least 8 hours  ?Discussed mask & tubing hygeine ? ?5. Gastroesophageal reflux disease with esophagitis without hemorrhage ?Avoid triggers, pasta sauce, spicy foods, alcohol ?Avoid lying down after eating ? ?6. Lumbar radiculopathy ?Ice/Heat ?Stretching ? ?7. Age related osteoporosis, unspecified pathological fracture presence ?Dexa completed 07/2019 - past due. ?Discussed scheduling.  ?Continue to optimize Vitamin D ?Discussed fall prevention, cleared walk ways, good lighting. ? ? ?8. Follicular lymphoma grade iiia, lymph nodes of head, face, and neck ([place]) ?Following Oncology Dr. Alvy Bimler.   ?Follow up as needed, no further scheduled. ?Continue to  monitor. ? ?9. Stage 3 chronic kidney disease, unspecified whether stage 3a or 3b CKD ([place]) ?Avoid NSAIDs ?Stay well hydrated. ?Continue to monitor.  ? ?10. Hyperlipidemia, mixed ?Continue Rosuvastatin. ?Lifestyle modifications ? ?11. Abnormal glucose ?Continue to monitor. ?Lifestyle modifications.  ? ?12. Vitamin D deficiency ?Continue Vitamin D Supplement. ? ?13. Anemia, chronic disease ?Stable. ?Monitor for increase in feeling dizzy, SOB.  ?Notify office if any blood in stool.  ?Continue to monitor ? ?14. Medication management ?All medications were discussed at length. ?All questions and concerns addressed. ? ?Contact office with any new or worsening symptoms. ? ?Further disposition pending results if labs check today. Discussed med's effects and SE's.   ?Over 30 minutes of face to face interview, exam, counseling, chart  review, and critical decision making was performed.  ? ? ?Future Appointments  ?Date Time Provider [place]  ?11/12/2021 11:00 AM Unk Pinto, MD [facility] None  ?08/06/2022 11:00 AM Darrol Jump, NP [facility] None  ? ? ?Plan:  ? ?During the course of the visit the patient was educated and counseled about appropriate screening and preventive services including:  ? ?Pneumococcal vaccine  ?Prevnar 13 ?Influenza vaccine ?Td vaccine ?Screening electrocardiogram ?Bone densitometry screening ?Colorectal cancer screening ?Diabetes screening ?Glaucoma screening ?Nutrition counseling  ?Advanced directives: requested ? ?.  ?Subjective:  ? ?Renee Hawkins is a 81 y.o. female who presents for Medicare annual wellness examination follow up.  ? ?She is accompanied by her neighbor who reports is very involved in her care. ? ?She lives by herself with her little shitzu dog, Sassy 9yo. ?She is active and walks with a neighbor.   ? ?Patient is s/p treatment follicular lymphoma, right neck, following oncology Dr. Alvy Bimler.  Follow up as needed, no further scheduled. ? ?She was started on nameda this year but stopped due to sedation. She is no longer taking any medication for memory. She does not wish to pursue this any further.  ? ?Dr. Felix Ahmadi, [place] 2022 had abnormal left optic nerve, had negative CBC, ESR, CRP and negative MRI of brain and orbits.  ? ?She has COPD with OSA, on CPAP and Breztri, Albuterol. She uses Breztri x1 in the AM and Albuterol x1 in the PM.  This is most effective for her. She wears CPAP nightly, 6-8 hours, states it is helping with breathing and energy, well controlled. Follows with Dr. Welford Roche PRN.  ? ?Had Dexa 07/2019, went from -3.2 to -3.1.  She continues Vitamin D supplement. ? ?She is no  longer driving. Neighbor and family provide transportation.  ? ?She eats two meals a day.  She reports small breakfast, muffin and coffee.  She typically has a sandwich for lunch.  She is trying to remain  well hydrated. ? ?BMI is Body mass index is 16.85 kg/m?., she has been working on diet and exercise.  She has gained 3 lb over the last 3 mo. ?Wt Readings from Last 3 Encounters:  ?08/05/21 93 lb 9.6 oz (42.5 kg)  ?04/29/21 90 lb 6.4 oz (41 kg)  ?12/10/20 99 lb 12.8 oz (45.3 kg)  ? ?Her blood pressure has been controlled at home, today their BP is BP: 138/82 ?She does workout, still at [employer]. Walking with a neighbor, lives byself.  She denies chest pain, shortness of breath, dizziness.  ? ?She is on cholesterol medication, she is not on a cholesterol medication. Her cholesterol is not at goal. The cholesterol last visit was:   ?Lab Results  ?Component Value Date  ? CHOL 114 04/29/2021  ? HDL 52 04/29/2021  ? [place] 42 04/29/2021  ? TRIG 123 04/29/2021  ? CHOLHDL 2.2 04/29/2021  ? ?Lab Results  ?Component Value Date  ? GFRNONAA 73 08/28/2020  ? ?She has been working on diet and exercise for glucose management, and denies nausea, paresthesia of the feet and polydipsia. Last A1C in the office was:  ?Lab Results  ?Component Value Date  ? HGBA1C 5.5 04/29/2021  ? ?Patient is on Vitamin D supplement.   ?Lab Results  ?Component Value Date  ? VD25OH 61 10/22/2020  ? ?  ?Lab Results  ?Component Value Date  ? GFRNONAA 73 08/28/2020  ? ? ?MEDICARE WELLNESS OBJECTIVES: ?Physical activity: Current Exercise Habits: Home exercise routine, Type of exercise: stretching, Time (Minutes): 20 ?Cardiac risk factors: Cardiac Risk Factors include: advanced age (>42mn, >>8women);dyslipidemia ?Depression/mood screen:   ? ?  08/05/2021  ?  2:57 PM  ?Depression screen PHQ 2/9  ?Decreased Interest 0  ?Down, Depressed, Hopeless 0  ?PHQ - 2 Score 0  ?  ?ADLs:  ? ?  08/05/2021  ?  2:58 PM 10/21/2020  ? 11:53 PM  ?In your present state of health, do you have any difficulty performing the following activities:  ?Hearing? 0 0  ?Vision? 0 0  ?Difficulty concentrating or making decisions? 1 0  ?Walking or climbing stairs? 0 0   ?Dressing or bathing? 0 0  ?Doing errands, shopping? 0 0  ?Preparing Food and eating ? N   ?Using the Toilet? N   ?In the past six months, have you accidently leaked urine? N   ?Do you have problems with loss of bowel control? N   ?Managing your Medications? Y   ?Managing your Finances? Y   ?Comment daughter in law and son help   ?Housekeeping or managing your Housekeeping? N   ?  ? ?Cognitive Testing ? Alert? Yes  Normal Appearance?Yes ? Oriented to person? Yes  Place? Yes ?  Time? Yes ? Recall of three objects?  Yes ? Can perform simple calculations? Yes ? Displays appropriate judgment?Yes ? Can read the correct time from a watch face?Yes ? ?EOL planning: Does Patient Have a Medical Advance Directive?: Yes ?Type of Advance Directive: Living will ? ? ? ?Medication Review ? ?Current Outpatient Medications (Endocrine & Metabolic):  ?  dexamethasone (DECADRON) 1 MG tablet, Take 3 tabs for 3 days, 2 tabs for 3 days 1 tab for 5 days. Take with food. (Patient not taking: Reported  on 08/05/2021) ? ?Current Outpatient Medications (Cardiovascular):  ?  atenolol (TENORMIN) 50 MG tablet, Take 1 tablet  Daily for BP                                                    /                         TAKE 1 TABLET BY MOUTH ONCE DAILY IN THE MORNING FOR BLOOD PRESSURE ?  rosuvastatin (CRESTOR) 5 MG tablet, Take  1 tablet  Daily  for Cholesterol ? ?Current Outpatient Medications (Respiratory):  ?  albuterol (VENTOLIN HFA) 108 (90 Base) MCG/ACT inhaler, Inhale 2 puffs into the lungs every 6 (six) hours as needed for wheezing or shortness of breath. ?  Budeson-Glycopyrrol-Formoterol (BREZTRI AEROSPHERE) 160-9-4.8 MCG/ACT AERO, Inhale 2 puffs into the lungs in the morning and at bedtime. ? ?Current Outpatient Medications (Analgesics):  ?  aspirin EC 81 MG tablet, Take 81 mg by mouth daily. ?  ibuprofen (ADVIL) 200 MG tablet, Take 200 mg by mouth every 6 (six) hours as needed. ? ? ?Current Outpatient Medications (Other):  ?  Ascorbic Acid  (VITAMIN C) 1000 MG tablet, Take 1,000 mg by mouth daily. ?  BLACK ELDERBERRY PO, Take by mouth. ?  Cholecalciferol (VITAMIN D) 50 MCG (2000 UT) CAPS, Take by mouth. ?  Homeopathic Products (ZICAM COLD REMEDY PO), Take

## 2021-10-14 ENCOUNTER — Other Ambulatory Visit: Payer: Self-pay | Admitting: Internal Medicine

## 2021-10-14 DIAGNOSIS — I10 Essential (primary) hypertension: Secondary | ICD-10-CM

## 2021-10-14 DIAGNOSIS — E782 Mixed hyperlipidemia: Secondary | ICD-10-CM

## 2021-10-14 MED ORDER — ATENOLOL 50 MG PO TABS: ORAL_TABLET | ORAL | 3 refills | Status: DC

## 2021-10-14 MED ORDER — ROSUVASTATIN CALCIUM 5 MG PO TABS: ORAL_TABLET | ORAL | 3 refills | Status: DC

## 2021-10-22 ENCOUNTER — Encounter: Payer: Medicare HMO | Admitting: Internal Medicine

## 2021-11-11 ENCOUNTER — Encounter: Payer: Medicare HMO | Admitting: Internal Medicine

## 2021-11-11 ENCOUNTER — Encounter: Payer: Self-pay | Admitting: Internal Medicine

## 2021-11-11 NOTE — Patient Instructions (Signed)

## 2021-11-11 NOTE — Progress Notes (Unsigned)
Annual Screening/Preventative Visit & Comprehensive Evaluation &  Examination  Future Appointments  Date Time Provider Department  11/12/2021                         cpe 11:00 AM Unk Pinto, MD [facility]  08/06/2022                        wellness 11:00 AM Darrol Jump, NP [facility]  11/13/2022                         cpe 11:00 AM Unk Pinto, MD [facility]        This very nice 81 y.o.  WWF presents for a Screening /Preventative Visit & comprehensive evaluation and management of multiple medical co-morbidities.  Patient has been followed for HTN, HLD, Prediabetes  and Vitamin D Deficiency.  Patient has hx/o COPD & OSA/CPAP  Overlap. Patient is accompanied by a neighbor & friend who monitors her closely. Patient has vascular Demential & had declined meds  for cognition. Patient has hx/o Aortic Atherosclerosis by CT scan.       Patient has hx/o Follicular Lymphoma of the Head & Neck (in remission) (C82.31)  & is followed by Dr Heath Lark .          HTN predates since 79. Patient's BP has been controlled off meds for several years  and patient denies any cardiac symptoms as chest pain, palpitations, shortness of breath, dizziness or ankle swelling. Today's BP  is                             .       Patient's hyperlipidemia is controlled with diet and Rosuvastatin. Patient denies myalgias or other medication SE's. Last lipids were at goal :  Lab Results  Component Value Date   CHOL 114 04/29/2021   HDL 52 04/29/2021   LDLCALC 42 04/29/2021   TRIG 123 04/29/2021   CHOLHDL 2.2 04/29/2021         Patient  is monitored for glucose intolerance and patient denies reactive hypoglycemic symptoms, visual blurring, diabetic polys or paresthesias. Last A1c was normal & at goal :  Lab Results  Component Value Date   HGBA1C 5.5 04/29/2021         Finally, patient has history of Vitamin D Deficiency and last Vitamin D was at goal : Lab Results  Component Value Date   VD25OH 61  10/22/2020       Current Outpatient Medications on File Prior to Visit  Medication Sig   aspirin EC 81 MG Take  daily.   Coenzyme Q10 10 MG  Take 1 tab 2 x /times daily.   gabapentin 300 MG  TAKE 1 CAP 3 - 4 x/DAILY AS NEEDED FOR PAIN   Magnesium 250 MG  Take  daily.    omeprazole 40 MG  TAKE 1 CAPSULE DAILY    K-DUR 10 MEQ  Take 67mEq in the AM and in the PM   rosuvastatin 5 MG  TAKE 1 TABLET DAILY FOR CHOLESTEROL   STIOLTO RESPIMAT 2.5-2.5  INHALE 2 PUFFS EVERY DAY   vitamin C 500 MG  Take  daily.    No Known Allergies   Past Medical History:  Diagnosis Date   Anxiety    COPD (chronic obstructive pulmonary disease) (HCC)    Diverticulosis  Diverticulosis of colon with hemorrhage    GERD (gastroesophageal reflux disease)    H/O total knee replacement, left 11/05/2017   Hx of adenomatous colonic polyps    Hyperlipidemia    Hypertension    Hypomagnesemia    Neuropathy    Peripheral neuropathy    Prediabetes    RLS (restless legs syndrome)    Sleep apnea    CPAP   Vitamin B12 deficiency    Vitamin D deficiency      Health Maintenance  Topic Date Due   Hepatitis C Screening  Never done   Zoster Vaccines- Shingrix (1 of 2) Never done   TETANUS/TDAP  08/14/2018   COVID-19 Vaccine (3 - Pfizer risk series) 06/29/2019   INFLUENZA VACCINE  11/04/2020   DEXA SCAN  Completed   PNA vac Low Risk Adult  Completed   HPV VACCINES  Aged Out     Immunization History  Administered Date(s) Administered   Influenza Split 01/18/2013   Influenza, High Dose  12/22/2016, 01/11/2018, 12/22/2018   Influenza 01/17/2018   PFIZER  SARS-COV-2 Vacc 05/11/2019, 06/01/2019   Pneumococcal -13 11/27/2013   Pneumococcal -23 11/19/2011   Td 08/13/2008   Zoster, Live 04/06/2010     Last Colon - 05/18/2014 - Dr Delfin Edis - recc 5 yr f/u - overdue Feb 2021 -  - received recall letter 05/17/2019 from Dr Gerrit Heck recc an [facility] - 11/09/2020 -   Past Surgical History:   Procedure Laterality Date   APPENDECTOMY     CATARACT EXTRACTION, BILATERAL  2017   Bilateral cataract Dr. Bing Plume   COLONOSCOPY N/A 05/18/2014   Procedure: COLONOSCOPY;  Surgeon: Lafayette Dragon, MD;  Location: [facility];  Service: Endoscopy;  Laterality: N/A;   DIRECT LARYNGOSCOPY Right 02/04/2016   Procedure: DIRECT LARYNGOSCOPY;  Surgeon: Rozetta Nunnery, MD;  Location: [place];  Service: ENT;  Laterality: Right;   LYMPH NODE BIOPSY Right 02/04/2016   Procedure: EXCISIONAL BIOPSY RIGHT NECK LYMPH NODE;  Surgeon: Rozetta Nunnery, MD;  Location: [place];  Service: ENT;  Laterality: Right;   TONSILLECTOMY     TOTAL HIP ARTHROPLASTY Left 10/21/2017   Procedure: TOTAL HIP ARTHROPLASTY ANTERIOR APPROACH;  Surgeon: Leandrew Koyanagi, MD;  Location: [place];  Service: Orthopedics;  Laterality: Left;     Family History  Problem Relation Age of Onset   Stroke Mother    Stroke Maternal Grandmother    Colon cancer Neg Hx      Social History   Tobacco Use   Smoking status: Former    Packs/day: 1.00    Years: 30.00    Pack years: 30.00    Types: Cigarettes    Quit date: 04/06/2012    Years since quitting: 8.5   Smokeless tobacco: Never  Substance Use Topics   Alcohol use: No   Drug use: No      ROS Constitutional: Denies fever, chills, weight loss/gain, headaches, insomnia,  night sweats, and change in appetite. Does c/o fatigue. Eyes: Denies redness, blurred vision, diplopia, discharge, itchy, watery eyes.  ENT: Denies discharge, congestion, post nasal drip, epistaxis, sore throat, earache, hearing loss, dental pain, Tinnitus, Vertigo, Sinus pain, snoring.  Cardio: Denies chest pain, palpitations, irregular heartbeat, syncope, dyspnea, diaphoresis, orthopnea, PND, claudication, edema Respiratory: denies cough, dyspnea, DOE, pleurisy, hoarseness, laryngitis, wheezing.  Gastrointestinal: Denies dysphagia, heartburn, reflux, water brash, pain,  cramps, nausea, vomiting, bloating, diarrhea, constipation, hematemesis, melena, hematochezia, jaundice, hemorrhoids Genitourinary:  Denies dysuria, frequency, urgency, nocturia, hesitancy, discharge, hematuria, flank pain Breast: Breast lumps, nipple discharge, bleeding.  Musculoskeletal: Denies arthralgia, myalgia, stiffness, Jt. Swelling, pain, limp, and strain/sprain. Denies falls. Skin: Denies puritis, rash, hives, warts, acne, eczema, changing in skin lesion Neuro: No weakness, tremor, incoordination, spasms, paresthesia, pain Psychiatric: Denies confusion, memory loss, sensory loss. Denies Depression. Endocrine: Denies change in weight, skin, hair change, nocturia, and paresthesia, diabetic polys, visual blurring, hyper / hypo glycemic episodes.  Heme/Lymph: No excessive bleeding, bruising, enlarged lymph nodes.  Physical Exam  There were no vitals taken for this visit.  General Appearance: Very thin /slight of build and in no apparent distress.  Eyes: PERRLA, EOMs, conjunctiva no swelling or erythema, normal fundi and vessels. Sinuses: No frontal/maxillary tenderness ENT/Mouth: EACs patent / TMs  nl. Nares clear without erythema, swelling, mucoid exudates. Oral hygiene is good.  Mallampati  III. No erythema, swelling, or exudate. Tongue normal. Tonsils not swollen or erythematous. Hearing normal.  Neck: Supple, thyroid not palpable. No bruits, nodes or JVD. Respiratory: Respiratory effort normal.  BS equal and clear bilateral without rales, rhonci, wheezing or stridor. Cardio: Heart sounds are normal with regular rate and rhythm and no murmurs, rubs or gallops. Peripheral pulses are normal and equal bilaterally without edema. No aortic or femoral bruits. Chest: symmetric with normal excursions and percussion. Breasts: Symmetric, without lumps, nipple discharge, retractions, or fibrocystic changes.  Abdomen: Flat, soft with bowel sounds active. Nontender, no guarding, rebound, hernias,  masses, or organomegaly.  Lymphatics: Non tender without lymphadenopathy.   Musculoskeletal: Full ROM all peripheral extremities, joint stability, 5/5 strength, and normal gait. Skin: Warm and dry without rashes, lesions, cyanosis, clubbing or  ecchymosis.  Neuro: Cranial nerves intact, reflexes equal bilaterally. Normal muscle tone, no cerebellar symptoms. Sensation intact.  Pysch: Alert and oriented X 3, normal affect, Insight and Judgment appropriate.    Assessment and Plan  1. Annual Preventative Screening Examination  2. Essential hypertension  - EKG 12-Lead - Urinalysis, Routine w reflex microscopic - Microalbumin / creatinine urine ratio - CBC with Differential/Platelet - COMPLETE METABOLIC PANEL WITH GFR - Magnesium - TSH  3. Hyperlipidemia, mixed  - EKG 12-Lead - Lipid panel - TSH  4. Abnormal glucose  - EKG 12-Lead - Hemoglobin A1c - Insulin, random  5. Vitamin D deficiency  - VITAMIN D 25 Hydroxy   6. OSA and COPD overlap syndrome ([place])  7. Aortic atherosclerosis ([place]) by Chest CTscan on 05/05/2018  - EKG 12-Lead  8. Screening for colorectal cancer  - POC Hemoccult Bld/Stl  9. Screening for ischemic heart disease  - EKG 12-Lead  10. Family hx-stroke  - EKG 12-Lead  11. Former smoker  - EKG 12-Lead  12. Medication management  - EKG 12-Lead - Urinalysis, Routine w reflex microscopic - Microalbumin / creatinine urine ratio - CBC with Differential/Platelet - COMPLETE METABOLIC PANEL WITH GFR - Magnesium - Lipid panel - TSH - Hemoglobin A1c - Insulin, random - VITAMIN D 25 Hydroxy  13. Bilateral low back pain with sciatica  - dexamethasone (DECADRON) 4 MG tablet; Take 1 tab 3 x day - 3 days, then 2 x day - 3 days, then 1 tab daily  Dispense: 20 tablet  - cyclobenzaprine (FLEXERIL) 10 MG tablet; Take 1/2 to 1 tablet 3 x /day as needed for Muscle Spasm  Dispense: 30 tablet; Refill: 1          Patient was counseled in prudent diet  to achieve/maintain BMI less than 25 for  weight control, BP monitoring, regular exercise and medications. Discussed med's effects and SE's. Screening labs and tests as requested with regular follow-up as recommended. Over 40 minutes of exam, counseling, chart review and high complex critical decision making was performed.   Kirtland Bouchard, MD

## 2021-11-12 ENCOUNTER — Encounter: Payer: Self-pay | Admitting: Internal Medicine

## 2021-11-12 ENCOUNTER — Ambulatory Visit (INDEPENDENT_AMBULATORY_CARE_PROVIDER_SITE_OTHER): Payer: Medicare HMO | Admitting: Internal Medicine

## 2021-11-12 VITALS — BP 110/70 | HR 80 | Temp 97.9°F | Resp 16 | Ht 62.0 in | Wt 96.0 lb

## 2021-11-12 DIAGNOSIS — R0989 Other specified symptoms and signs involving the circulatory and respiratory systems: Secondary | ICD-10-CM | POA: Diagnosis not present

## 2021-11-12 DIAGNOSIS — R6889 Other general symptoms and signs: Secondary | ICD-10-CM

## 2021-11-12 DIAGNOSIS — Z79899 Other long term (current) drug therapy: Secondary | ICD-10-CM

## 2021-11-12 DIAGNOSIS — Z1211 Encounter for screening for malignant neoplasm of colon: Secondary | ICD-10-CM

## 2021-11-12 DIAGNOSIS — R7309 Other abnormal glucose: Secondary | ICD-10-CM

## 2021-11-12 DIAGNOSIS — Z136 Encounter for screening for cardiovascular disorders: Secondary | ICD-10-CM

## 2021-11-12 DIAGNOSIS — Z0000 Encounter for general adult medical examination without abnormal findings: Secondary | ICD-10-CM

## 2021-11-12 DIAGNOSIS — I700 Atherosclerosis of aorta: Secondary | ICD-10-CM

## 2021-11-12 DIAGNOSIS — E559 Vitamin D deficiency, unspecified: Secondary | ICD-10-CM

## 2021-11-12 DIAGNOSIS — Z0001 Encounter for general adult medical examination with abnormal findings: Secondary | ICD-10-CM | POA: Diagnosis not present

## 2021-11-12 DIAGNOSIS — C8231 Follicular lymphoma grade IIIa, lymph nodes of head, face, and neck: Secondary | ICD-10-CM | POA: Diagnosis not present

## 2021-11-12 DIAGNOSIS — Z823 Family history of stroke: Secondary | ICD-10-CM

## 2021-11-12 DIAGNOSIS — E782 Mixed hyperlipidemia: Secondary | ICD-10-CM | POA: Diagnosis not present

## 2021-11-12 DIAGNOSIS — I10 Essential (primary) hypertension: Secondary | ICD-10-CM

## 2021-11-12 DIAGNOSIS — G4733 Obstructive sleep apnea (adult) (pediatric): Secondary | ICD-10-CM

## 2021-11-13 LAB — URINALYSIS, ROUTINE W REFLEX MICROSCOPIC
Bilirubin Urine: NEGATIVE
Glucose, UA: NEGATIVE
Hgb urine dipstick: NEGATIVE
Ketones, ur: NEGATIVE
Leukocytes,Ua: NEGATIVE
Nitrite: NEGATIVE
Protein, ur: NEGATIVE
Specific Gravity, Urine: 1.014 (ref 1.001–1.035)
pH: 7.5 (ref 5.0–8.0)

## 2021-11-13 LAB — CBC WITH DIFFERENTIAL/PLATELET
Absolute Monocytes: 323 cells/uL (ref 200–950)
Basophils Absolute: 18 cells/uL (ref 0–200)
Basophils Relative: 0.3 %
Eosinophils Absolute: 128 cells/uL (ref 15–500)
Eosinophils Relative: 2.1 %
HCT: 40 % (ref 35.0–45.0)
Hemoglobin: 13.5 g/dL (ref 11.7–15.5)
Lymphs Abs: 1159 cells/uL (ref 850–3900)
MCH: 31.6 pg (ref 27.0–33.0)
MCHC: 33.8 g/dL (ref 32.0–36.0)
MCV: 93.7 fL (ref 80.0–100.0)
MPV: 11.5 fL (ref 7.5–12.5)
Monocytes Relative: 5.3 %
Neutro Abs: 4471 cells/uL (ref 1500–7800)
Neutrophils Relative %: 73.3 %
Platelets: 164 10*3/uL (ref 140–400)
RBC: 4.27 10*6/uL (ref 3.80–5.10)
RDW: 12.3 % (ref 11.0–15.0)
Total Lymphocyte: 19 %
WBC: 6.1 10*3/uL (ref 3.8–10.8)

## 2021-11-13 LAB — COMPLETE METABOLIC PANEL WITH GFR
AG Ratio: 1.9 (calc) (ref 1.0–2.5)
ALT: 13 U/L (ref 6–29)
AST: 17 U/L (ref 10–35)
Albumin: 4.7 g/dL (ref 3.6–5.1)
Alkaline phosphatase (APISO): 79 U/L (ref 37–153)
BUN/Creatinine Ratio: 19 (calc) (ref 6–22)
BUN: 19 mg/dL (ref 7–25)
CO2: 32 mmol/L (ref 20–32)
Calcium: 10.1 mg/dL (ref 8.6–10.4)
Chloride: 99 mmol/L (ref 98–110)
Creat: 1.01 mg/dL — ABNORMAL HIGH (ref 0.60–1.00)
Globulin: 2.5 g/dL (calc) (ref 1.9–3.7)
Glucose, Bld: 86 mg/dL (ref 65–99)
Potassium: 4.7 mmol/L (ref 3.5–5.3)
Sodium: 146 mmol/L (ref 135–146)
Total Bilirubin: 0.3 mg/dL (ref 0.2–1.2)
Total Protein: 7.2 g/dL (ref 6.1–8.1)
eGFR: 57 mL/min/{1.73_m2} — ABNORMAL LOW (ref >=60)

## 2021-11-13 LAB — HEMOGLOBIN A1C
Hgb A1c MFr Bld: 5.4 % of total Hgb (ref <5.7)
Mean Plasma Glucose: 108 mg/dL
eAG (mmol/L): 6 mmol/L

## 2021-11-13 LAB — LIPID PANEL
Cholesterol: 148 mg/dL (ref <200)
HDL: 70 mg/dL (ref >=50)
LDL Cholesterol (Calc): 55 mg/dL (calc)
Non-HDL Cholesterol (Calc): 78 mg/dL (calc) (ref <130)
Total CHOL/HDL Ratio: 2.1 (calc) (ref <5.0)
Triglycerides: 145 mg/dL (ref <150)

## 2021-11-13 LAB — MAGNESIUM: Magnesium: 1.8 mg/dL (ref 1.5–2.5)

## 2021-11-13 LAB — TSH: TSH: 2.23 mIU/L (ref 0.40–4.50)

## 2021-11-13 LAB — VITAMIN D 25 HYDROXY (VIT D DEFICIENCY, FRACTURES): Vit D, 25-Hydroxy: 43 ng/mL (ref 30–100)

## 2021-11-13 LAB — INSULIN, RANDOM: Insulin: 34 u[IU]/mL — ABNORMAL HIGH

## 2021-11-13 NOTE — Progress Notes (Signed)
<><><><><><><><><><><><><><><><><><><><><><><><><><><><><><><><><> <><><><><><><><><><><><><><><><><><><><><><><><><><><><><><><><><> -   Test results slightly outside the reference range are not unusual. If there is anything important, I will review this with you,  otherwise it is considered normal test values.  If you have further questions,  please do not hesitate to contact me at the office or via My Chart.  <><><><><><><><><><><><><><><><><><><><><><><><><><><><><><><><><> <><><><><><><><><><><><><><><><><><><><><><><><><><><><><><><><><>  - Magnesium  -  1.8   -  very  low- goal is betw 2.0 - 2.5,   - So..............Marland Kitchen  Recommend that you take                                               Magnesium 500 mg tablet daily   - also important to eat lots of  leafy green vegetables   - spinach - Kale - collards - greens - okra - asparagus  - broccoli - quinoa - squash - almonds   - black, red, white beans -  peas - green beans <><><><><><><><><><><><><><><><><><><><><><><><><><><><><><><><><>  - Total Chol = 148  &  LDL Chol = 55 - Both  Excellent   - Very low risk for Heart Attack  / Stroke <><><><><><><><><><><><><><><><><><><><><><><><><><><><><><><><><>  - A1c - Normal - No Diabetes  - Great !  <><><><><><><><><><><><><><><><><><><><><><><><><><><><><><><><><>  - Vitamin D = Low   - Vitamin D goal is between 70-100.   - Please INCREASE  your Vitamin D up to  5,000 units  / day  !   - It is very important as a natural anti-inflammatory and helping the  immune system protect against viral infections, like the Covid-19    helping hair, skin, and nails, as well as reducing stroke and  heart attack risk.   - It helps your bones and helps with mood.  - It also decreases numerous cancer risks so please  take it as directed.   - Low Vit D is associated with a 200-300% higher risk for  CANCER   and 200-300% higher risk for HEART   ATTACK  &  STROKE.    - It is also  associated with higher death rate at younger ages,   autoimmune diseases like Rheumatoid arthritis, Lupus,  Multiple Sclerosis.     - Also many other serious conditions, like depression, Alzheimer's  Dementia, infertility, muscle aches, fatigue, fibromyalgia   - just to name a few. <><><><><><><><><><><><><><><><><><><><><><><><><><><><><><><><><> <><><><><><><><><><><><><><><><><><><><><><><><><><><><><><><><><>  - All Else - CBC - Kidneys - Electrolytes - Liver - Magnesium & Thyroid    - all  Normal / OK <><><><><><><><><><><><><><><><><><><><><><><><><><><><><><><><><> <><><><><><><><><><><><><><><><><><><><><><><><><><><><><><><><><>

## 2021-11-18 ENCOUNTER — Other Ambulatory Visit: Payer: Self-pay

## 2021-11-18 NOTE — Patient Outreach (Signed)
[facility]) Care Management  11/18/2021  CARNESHIA RAKER [birth year] [id number]   Telephone call to patient for nurse call.  No answer.  HIPAA compliant voice message left.    Plan: RN CM will attempt again within 4 business days and send letter.   Jone Baseman, RN, MSN [facility] Care Management Care Management Coordinator Direct Line [phone] Toll Free: [phone]  Fax: [phone]

## 2021-11-20 ENCOUNTER — Other Ambulatory Visit: Payer: Self-pay

## 2021-11-20 NOTE — Patient Outreach (Signed)
[facility]) Care Management  11/20/2021  Renee Hawkins 1941/01/27 [id number]   Telephone call to patient for nurse call.  No answer. Unable to leave a message.  Plan: RN CM will attempt again within 4 business days.  Jone Baseman, RN, MSN [facility] Care Management Care Management Coordinator Direct Line [phone] Toll Free: [phone]  Fax: [phone]

## 2021-11-21 ENCOUNTER — Other Ambulatory Visit: Payer: Self-pay

## 2021-11-21 NOTE — Patient Outreach (Signed)
[facility]) Care Management  11/21/2021  JESSYE IMHOFF 06/02/1940 [id number]   Telephone call to patient for nurse call. No answer.  Unable to leave a message.  Plan: RN CM will attempt again in the month of August.   Sofija Antwi J Savaya Hakes, RN, MSN [place] Management Care Management Coordinator Direct Line [phone] Toll Free: [phone]  Fax: [phone]

## 2021-12-03 ENCOUNTER — Other Ambulatory Visit: Payer: Self-pay

## 2021-12-03 NOTE — Patient Outreach (Signed)
[facility]) Care Management  12/03/2021  REYGAN HEAGLE Oct 28, 1940 [id number]   Telephone call to patient for final nurse call.  No answer.  HIPAA complaint voice message left.    Plan: Will close case.  Jone Baseman, RN, MSN [facility] Care Management Care Management Coordinator Direct Line [phone] Toll Free: [phone]  Fax: [phone]

## 2022-01-09 ENCOUNTER — Other Ambulatory Visit: Payer: Self-pay | Admitting: Internal Medicine

## 2022-01-09 ENCOUNTER — Other Ambulatory Visit: Payer: Self-pay | Admitting: Nurse Practitioner

## 2022-01-09 DIAGNOSIS — G4733 Obstructive sleep apnea (adult) (pediatric): Secondary | ICD-10-CM

## 2022-01-09 DIAGNOSIS — J439 Emphysema, unspecified: Secondary | ICD-10-CM

## 2022-01-09 MED ORDER — STIOLTO RESPIMAT 2.5-2.5 MCG/ACT IN AERS: INHALATION_SPRAY | RESPIRATORY_TRACT | 3 refills | Status: DC

## 2022-02-11 ENCOUNTER — Ambulatory Visit (INDEPENDENT_AMBULATORY_CARE_PROVIDER_SITE_OTHER): Payer: Medicare HMO | Admitting: Nurse Practitioner

## 2022-02-11 ENCOUNTER — Encounter: Payer: Self-pay | Admitting: Nurse Practitioner

## 2022-02-11 VITALS — BP 130/68 | HR 68 | Temp 97.8°F | Resp 18 | Ht 62.0 in | Wt 97.0 lb

## 2022-02-11 DIAGNOSIS — R509 Fever, unspecified: Secondary | ICD-10-CM

## 2022-02-11 DIAGNOSIS — R454 Irritability and anger: Secondary | ICD-10-CM | POA: Diagnosis not present

## 2022-02-11 DIAGNOSIS — R102 Pelvic and perineal pain: Secondary | ICD-10-CM

## 2022-02-11 DIAGNOSIS — R519 Headache, unspecified: Secondary | ICD-10-CM | POA: Diagnosis not present

## 2022-02-11 DIAGNOSIS — R4181 Age-related cognitive decline: Secondary | ICD-10-CM | POA: Diagnosis not present

## 2022-02-11 NOTE — Progress Notes (Signed)
Assessment and Plan:  Renee Hawkins was seen today for a follow up.  Diagnoses and all order for this visit:  1. Fever, unspecified fever cause Continue to monitor for increase in fever along with N/V, back and abdominal pain  Notify office s/s fail to improve or do not respond to treatment.  - Urinalysis, Routine w reflex microscopic  2. Irritable Continue to monitor Could be r/t possible UTI vs. Increase in cognitive decline  - Urinalysis, Routine w reflex microscopic  3. Suprapubic pain Continue to monitor for increase in urinary urgency, frequency, blood in urine. Notify  office s/s fail to improve or do not respond to tmt  - Urinalysis, Routine w reflex microscopic  4. Acute nonintractable headache, unspecified headache type Tylenol OTC as directed as needed Rest  5. Age-related cognitive decline Continue to monitor  Possible sun down syndrome. May need further intervention through SNF placement if cognitive decline worsens  Orders Placed This Encounter  Procedures   MICROSCOPIC MESSAGE   Urinalysis, Routine w reflex microscopic   No orders of the defined types were placed in this encounter.  Notify office for further evaluation and treatment, questions or concerns if s/s fail to improve. The risks and benefits of my recommendations, as well as other treatment options were discussed with the patient today. Questions were answered.  Further disposition pending results of labs. Discussed med's effects and SE's.    Over 20 minutes of exam, counseling, chart review, and critical decision making was performed.   Future Appointments  Date Time Provider [place]  03/17/2022 11:00 AM Darrol Jump, NP [facility] None  08/06/2022 11:00 AM Darrol Jump, NP [facility] None  11/13/2022 11:00 AM Unk Pinto, MD [facility] None     ------------------------------------------------------------------------------------------------------------------   HPI BP 130/68   Pulse 68   Temp 97.8 F (36.6 C)   Resp 18   Ht '5\' 2"'$  (1.575 m)   Wt 97 lb (44 kg)   SpO2 94%   BMI 17.74 kg/m   81 y.o.female presents alongside caretaker for general concern of "sundowning." Caretaker states that patient has become more frustrated in the evenings after the time change.  Reports patient has increased irritability, pulling at her own hair, verbally abusing herself for forgetting things.  Has had multiple outbursts including yelling and screaming.   This is unlike her normal behavior.  Caretaker lives across street and checks on her multiple times a day and at night.  She does report patient has not been feeling well with c/o HA and fever last week during these episodes.  Patient did not report any symptoms other than what is noted during that time.  Due to increase in cognitive decline and becoming more forgetful patient does not remember feeling unwell when questioned in office.  She does note some lower abdominal pain.    Past Medical History:  Diagnosis Date   Anxiety    COPD (chronic obstructive pulmonary disease) ([place])    Diverticulosis    Diverticulosis of colon with hemorrhage    GERD (gastroesophageal reflux disease)    H/O total knee replacement, left 11/05/2017   Hx of adenomatous colonic polyps    Hyperlipidemia    Hypertension    Hypomagnesemia    Neuropathy    Peripheral neuropathy    Prediabetes    RLS (restless legs syndrome)    Sleep apnea    CPAP   Vitamin B12 deficiency    Vitamin D deficiency      No Known Allergies  Current Outpatient Medications on File Prior to Visit  Medication Sig   albuterol (VENTOLIN HFA) 108 (90 Base) MCG/ACT inhaler Inhale 2 puffs into the lungs every 6 (six) hours as needed for wheezing or shortness of breath.   Ascorbic Acid (VITAMIN C) 1000 MG tablet Take 1,000 mg by  mouth daily.   aspirin EC 81 MG tablet Take 81 mg by mouth daily.   atenolol (TENORMIN) 50 MG tablet Take 1 tablet  Daily for BP                                                                         /                         TAKE                      BY                         MOUTH   Budeson-Glycopyrrol-Formoterol (BREZTRI AEROSPHERE) 160-9-4.8 MCG/ACT AERO Use 2 inhalations  (15 minutes apart)   2 x /day  (every 12 hours)   Cholecalciferol (VITAMIN D) 50 MCG (2000 UT) CAPS Take by mouth.   ibuprofen (ADVIL) 200 MG tablet Take 200 mg by mouth every 6 (six) hours as needed.   rosuvastatin (CRESTOR) 5 MG tablet Take  1 tablet  Daily  for Cholesterol   Tiotropium Bromide-Olodaterol (STIOLTO RESPIMAT) 2.5-2.5 MCG/ACT AERS Use  2 Inhalations  every day  for COPD /Emphysema   potassium chloride (K-DUR,KLOR-CON) 10 MEQ tablet Take 10-20 mEq by mouth See admin instructions. 52mq in the AM and in the PM (Patient not taking: Reported on 02/11/2022)   No current facility-administered medications on file prior to visit.    ROS: all negative except what is noted in the HPI.   Physical Exam:  BP 130/68   Pulse 68   Temp 97.8 F (36.6 C)   Resp 18   Ht '5\' 2"'$  (1.575 m)   Wt 97 lb (44 kg)   SpO2 94%   BMI 17.74 kg/m   General Appearance: NAD.  Awake, conversant and cooperative. Eyes: PERRLA, EOMs intact.  Sclera white.  Conjunctiva without erythema. Sinuses: No frontal/maxillary tenderness.  No nasal discharge. Nares patent.  ENT/Mouth: Ext aud canals clear.  Bilateral TMs w/DOL and without erythema or bulging. Hearing intact.  Posterior pharynx without swelling or exudate.  Tonsils without swelling or erythema.  Neck: Supple.  No masses, nodules or thyromegaly. Respiratory: Effort is regular with non-labored breathing. Breath sounds are equal bilaterally without rales, rhonchi, wheezing or stridor.  Cardio: RRR with no MRGs. Brisk peripheral pulses without edema.  Abdomen: Active BS in all four  quadrants.  Soft and non-tender without guarding, rebound tenderness, hernias or masses. Lymphatics: Non tender without lymphadenopathy.  Musculoskeletal: Full ROM, 5/5 strength, normal ambulation.  No clubbing or cyanosis. Skin: Appropriate color for ethnicity. Warm without rashes, lesions, ecchymosis, ulcers.  Neuro: CN II-XII grossly normal. Normal muscle tone without cerebellar symptoms and intact sensation.   Psych: AO X 3,  appropriate mood and affect, insight and judgment.     Natasia Sanko,  NP 3:03 PM [facility]

## 2022-02-11 NOTE — Patient Instructions (Signed)
Urinary Tract Infection, Adult A urinary tract infection (UTI) is an infection of any part of the urinary tract. The urinary tract includes: The kidneys. The ureters. The bladder. The urethra. These organs make, store, and get rid of pee (urine) in the body. What are the causes? This infection is caused by germs (bacteria) in your genital area. These germs grow and cause swelling (inflammation) of your urinary tract. What increases the risk? The following factors may make you more likely to develop this condition: Using a small, thin tube (catheter) to drain pee. Not being able to control when you pee or poop (incontinence). Being female. If you are female, these things can increase the risk: Using these methods to prevent pregnancy: A medicine that kills sperm (spermicide). A device that blocks sperm (diaphragm). Having low levels of a female hormone (estrogen). Being pregnant. You are more likely to develop this condition if: You have genes that add to your risk. You are sexually active. You take antibiotic medicines. You have trouble peeing because of: A prostate that is bigger than normal, if you are female. A blockage in the part of your body that drains pee from the bladder. A kidney stone. A nerve condition that affects your bladder. Not getting enough to drink. Not peeing often enough. You have other conditions, such as: Diabetes. A weak disease-fighting system (immune system). Sickle cell disease. Gout. Injury of the spine. What are the signs or symptoms? Symptoms of this condition include: Needing to pee right away. Peeing small amounts often. Pain or burning when peeing. Blood in the pee. Pee that smells bad or not like normal. Trouble peeing. Pee that is cloudy. Fluid coming from the vagina, if you are female. Pain in the belly or lower back. Other symptoms include: Vomiting. Not feeling hungry. Feeling mixed up (confused). This may be the first symptom in  older adults. Being tired and grouchy (irritable). A fever. Watery poop (diarrhea). How is this treated? Taking antibiotic medicine. Taking other medicines. Drinking enough water. In some cases, you may need to see a specialist. Follow these instructions at home:  Medicines Take over-the-counter and prescription medicines only as told by your doctor. If you were prescribed an antibiotic medicine, take it as told by your doctor. Do not stop taking it even if you start to feel better. General instructions Make sure you: Pee until your bladder is empty. Do not hold pee for a long time. Empty your bladder after sex. Wipe from front to back after peeing or pooping if you are a female. Use each tissue one time when you wipe. Drink enough fluid to keep your pee pale yellow. Keep all follow-up visits. Contact a doctor if: You do not get better after 1-2 days. Your symptoms go away and then come back. Get help right away if: You have very bad back pain. You have very bad pain in your lower belly. You have a fever. You have chills. You feeling like you will vomit or you vomit. Summary A urinary tract infection (UTI) is an infection of any part of the urinary tract. This condition is caused by germs in your genital area. There are many risk factors for a UTI. Treatment includes antibiotic medicines. Drink enough fluid to keep your pee pale yellow. This information is not intended to replace advice given to you by your health care provider. Make sure you discuss any questions you have with your health care provider. Document Revised: 11/03/2019 Document Reviewed: 11/03/2019 Elsevier Patient Education    2023 Elsevier Inc.  

## 2022-02-12 ENCOUNTER — Telehealth: Payer: Self-pay | Admitting: Nurse Practitioner

## 2022-02-12 ENCOUNTER — Other Ambulatory Visit: Payer: Self-pay | Admitting: Nurse Practitioner

## 2022-02-12 DIAGNOSIS — R102 Pelvic and perineal pain: Secondary | ICD-10-CM | POA: Diagnosis not present

## 2022-02-12 DIAGNOSIS — N3000 Acute cystitis without hematuria: Secondary | ICD-10-CM

## 2022-02-12 DIAGNOSIS — R509 Fever, unspecified: Secondary | ICD-10-CM

## 2022-02-12 LAB — URINALYSIS, ROUTINE W REFLEX MICROSCOPIC
Bacteria, UA: NONE SEEN /HPF
Bilirubin Urine: NEGATIVE
Glucose, UA: NEGATIVE
Hgb urine dipstick: NEGATIVE
Hyaline Cast: NONE SEEN /LPF
Ketones, ur: NEGATIVE
Nitrite: NEGATIVE
Protein, ur: NEGATIVE
Specific Gravity, Urine: 1.021 (ref 1.001–1.035)
pH: 6.5 (ref 5.0–8.0)

## 2022-02-12 LAB — MICROSCOPIC MESSAGE

## 2022-02-12 MED ORDER — SERTRALINE HCL 25 MG PO TABS: 25.0000 mg | ORAL_TABLET | Freq: Every day | ORAL | 0 refills | Status: DC

## 2022-02-12 MED ORDER — NITROFURANTOIN MONOHYD MACRO 100 MG PO CAPS: 100.0000 mg | ORAL_CAPSULE | Freq: Two times a day (BID) | ORAL | 0 refills | Status: AC

## 2022-02-12 NOTE — Telephone Encounter (Signed)
Patient's caretaker states that you were going to call in Zoloft to her pharmacy after her visit yesterday.Marland KitchenMarland KitchenMarland KitchenPlease send to [facility] [address] in [facility]

## 2022-02-13 LAB — URINE CULTURE
MICRO NUMBER:: 14168120
Result:: NO GROWTH
SPECIMEN QUALITY:: ADEQUATE

## 2022-03-02 NOTE — Progress Notes (Unsigned)
Assessment and Plan: Renee Hawkins was seen today for acute visit.  Diagnoses and all orders for this visit:  Flu-like symptoms -     POCT Influenza A/B  Encounter for screening for COVID-19 -     POC COVID-19  URI, acute Push fluids Mucinex as needed for congestions Prednisone 20 mg 3 tabs x 3 days, 2 tabs x 3 days then 1 tab x 5 days Tessalon perles for cough during the day Promethazine DM cough syrup at night for cough -     albuterol (VENTOLIN HFA) 108 (90 Base) MCG/ACT inhaler; Inhale 2 puffs into the lungs every 6 (six) hours as needed for wheezing or shortness of breath. -     predniSONE (DELTASONE) 20 MG tablet; 3 tablets daily with food for 3 days, 2 tabs daily for 3 days, 1 tab a day for 5 days. -     promethazine-dextromethorphan (PROMETHAZINE-DM) 6.25-15 MG/5ML syrup; Take 5 mLs by mouth 4 (four) times daily as needed for cough. -     benzonatate (TESSALON PERLES) 100 MG capsule; Take 1 capsule (100 mg total) by mouth every 6 (six) hours as needed for cough.  Essential hypertension - continue medications, DASH diet, exercise and monitor at home. Call if greater than 130/80.    Abnormal Urine - Routine UA with reflex microscopic - Urine culture    Further disposition pending results of labs. Discussed med's effects and SE's.   Over 30 minutes of exam, counseling, chart review, and critical decision making was performed.   Future Appointments  Date Time Provider [place]  03/17/2022 11:00 AM Darrol Jump, NP [facility] None  08/06/2022 11:00 AM Darrol Jump, NP [facility] None  11/13/2022 11:00 AM Unk Pinto, MD [facility] None    ------------------------------------------------------------------------------------------------------------------   HPI BP (!) 110/50   Pulse 77   Temp 97.9 F (36.6 C)   Ht '5\' 2"'$  (1.575 m)   Wt 97 lb (44 kg)   SpO2 91%   BMI 17.74 kg/m    81 y.o.female presents for productive cough of yellow mucus and sinus  congestion, fever, joint aching, muscle aches and some laryngitis. Denies nausea, vomiting and diarrhea. Symptoms began 4 days ago She is using Symbicort. She has not tried any over the counter medication.   Patient is requesting a test of cure of previous UTI.   She currently is on Atenolol 50 mg only if BP greater than 140/80.  Denies chest pain, dizziness and shortness of breath.  BP Readings from Last 3 Encounters:  03/03/22 (!) 110/50  02/11/22 130/68  11/12/21 110/70     Past Medical History:  Diagnosis Date   Anxiety    COPD (chronic obstructive pulmonary disease) ([facility])    Diverticulosis    Diverticulosis of colon with hemorrhage    GERD (gastroesophageal reflux disease)    H/O total knee replacement, left 11/05/2017   Hx of adenomatous colonic polyps    Hyperlipidemia    Hypertension    Hypomagnesemia    Neuropathy    Peripheral neuropathy    Prediabetes    RLS (restless legs syndrome)    Sleep apnea    CPAP   Vitamin B12 deficiency    Vitamin D deficiency      No Known Allergies  Current Outpatient Medications on File Prior to Visit  Medication Sig   Ascorbic Acid (VITAMIN C) 1000 MG tablet Take 1,000 mg by mouth daily.   aspirin EC 81 MG tablet Take 81 mg by mouth daily.  atenolol (TENORMIN) 50 MG tablet Take 1 tablet  Daily for BP                                                                         /                         TAKE                      BY                         MOUTH   Cholecalciferol (VITAMIN D) 50 MCG (2000 UT) CAPS Take by mouth.   Multiple Vitamins-Minerals (ZINC PO) Take by mouth.   rosuvastatin (CRESTOR) 5 MG tablet Take  1 tablet  Daily  for Cholesterol   sertraline (ZOLOFT) 25 MG tablet Take 1 tablet (25 mg total) by mouth daily.   Budeson-Glycopyrrol-Formoterol (BREZTRI AEROSPHERE) 160-9-4.8 MCG/ACT AERO Use 2 inhalations  (15 minutes apart)   2 x /day  (every 12 hours)   potassium chloride (K-DUR,KLOR-CON) 10 MEQ tablet Take 10-20  mEq by mouth See admin instructions. 4mq in the AM and in the PM (Patient not taking: Reported on 02/11/2022)   Tiotropium Bromide-Olodaterol (STIOLTO RESPIMAT) 2.5-2.5 MCG/ACT AERS Use  2 Inhalations  every day  for COPD /Emphysema (Patient not taking: Reported on 03/03/2022)   No current facility-administered medications on file prior to visit.    ROS: all negative except above.   Physical Exam:  BP (!) 110/50   Pulse 77   Temp 97.9 F (36.6 C)   Ht '5\' 2"'$  (1.575 m)   Wt 97 lb (44 kg)   SpO2 91%   BMI 17.74 kg/m   General Appearance: Well nourished, in no apparent distress. Eyes: PERRLA, EOMs, conjunctiva no swelling or erythema Sinuses: No Frontal/maxillary tenderness ENT/Mouth: Ext aud canals clear, TMs without erythema, bulging. No erythema, swelling, or exudate on post pharynx.  Tonsils not swollen or erythematous. Hearing normal.  Neck: Supple, thyroid normal.  Respiratory: Respiratory effort normal, BS equal bilaterally without rales, rhonchi, wheezing or stridor.  Cardio: RRR with no MRGs. Brisk peripheral pulses without edema.  Abdomen: Soft, + BS.  Non tender, no guarding, rebound, hernias, masses. Lymphatics: Non tender without lymphadenopathy.  Musculoskeletal: Full ROM, 5/5 strength, normal gait.  Skin: Warm, dry without rashes, lesions, ecchymosis.  Neuro: Cranial nerves intact. Normal muscle tone, no cerebellar symptoms. Sensation intact.  Psych: Awake and oriented X 3, normal affect, Insight and Judgment appropriate.     DAlycia Rossetti NP 12:13 PM [facility]

## 2022-03-03 ENCOUNTER — Ambulatory Visit (INDEPENDENT_AMBULATORY_CARE_PROVIDER_SITE_OTHER): Payer: Medicare HMO | Admitting: Nurse Practitioner

## 2022-03-03 ENCOUNTER — Encounter: Payer: Self-pay | Admitting: Nurse Practitioner

## 2022-03-03 ENCOUNTER — Other Ambulatory Visit: Payer: Self-pay

## 2022-03-03 VITALS — BP 110/50 | HR 77 | Temp 97.9°F | Ht 62.0 in | Wt 97.0 lb

## 2022-03-03 DIAGNOSIS — R8290 Unspecified abnormal findings in urine: Secondary | ICD-10-CM | POA: Diagnosis not present

## 2022-03-03 DIAGNOSIS — Z1152 Encounter for screening for COVID-19: Secondary | ICD-10-CM

## 2022-03-03 DIAGNOSIS — J069 Acute upper respiratory infection, unspecified: Secondary | ICD-10-CM | POA: Diagnosis not present

## 2022-03-03 DIAGNOSIS — R6889 Other general symptoms and signs: Secondary | ICD-10-CM

## 2022-03-03 DIAGNOSIS — I10 Essential (primary) hypertension: Secondary | ICD-10-CM | POA: Diagnosis not present

## 2022-03-03 LAB — POC COVID19 BINAXNOW: SARS Coronavirus 2 Ag: NEGATIVE

## 2022-03-03 LAB — POCT INFLUENZA A/B
Influenza A, POC: NEGATIVE
Influenza B, POC: NEGATIVE

## 2022-03-03 MED ORDER — PREDNISONE 20 MG PO TABS: ORAL_TABLET | ORAL | 0 refills | Status: AC

## 2022-03-03 MED ORDER — ALBUTEROL SULFATE HFA 108 (90 BASE) MCG/ACT IN AERS: 2.0000 | INHALATION_SPRAY | Freq: Four times a day (QID) | RESPIRATORY_TRACT | 2 refills | Status: DC | PRN

## 2022-03-03 MED ORDER — PROMETHAZINE-DM 6.25-15 MG/5ML PO SYRP: 5.0000 mL | ORAL_SOLUTION | Freq: Four times a day (QID) | ORAL | 1 refills | Status: DC | PRN

## 2022-03-03 MED ORDER — BENZONATATE 100 MG PO CAPS: 100.0000 mg | ORAL_CAPSULE | Freq: Four times a day (QID) | ORAL | 1 refills | Status: DC | PRN

## 2022-03-03 NOTE — Patient Instructions (Addendum)
Push fluids Mucinex as needed for congestions  Prednisone 20 mg 3 tabs x 3 days, 2 tabs x 3 days then 1 tab x 5 days  Tessalon perles for cough during the day  Promethazine DM cough syrup at night for cough   Upper Respiratory Infection, Adult An upper respiratory infection (URI) affects the nose, throat, and upper airways that lead to the lungs. The most common type of URI is often called the common cold. URIs usually get better on their own, without medical treatment. What are the causes? A URI is caused by a germ (virus). You may catch these germs by: Breathing in droplets from an infected person's cough or sneeze. Touching something that has the germ on it (is contaminated) and then touching your mouth, nose, or eyes. What increases the risk? You are more likely to get a URI if: You are very young or very old. You have close contact with others, such as at work, school, or a health care facility. You smoke. You have long-term (chronic) heart or lung disease. You have a weakened disease-fighting system (immune system). You have nasal allergies or asthma. You have a lot of stress. You have poor nutrition. What are the signs or symptoms? Runny or stuffy (congested) nose. Cough. Sneezing. Sore throat. Headache. Feeling tired (fatigue). Fever. Not wanting to eat as much as usual. Pain in your forehead, behind your eyes, and over your cheekbones (sinus pain). Muscle aches. Redness or irritation of the eyes. Pressure in the ears or face. How is this treated? URIs usually get better on their own within 7-10 days. Medicines cannot cure URIs, but your doctor may recommend certain medicines to help relieve symptoms, such as: Over-the-counter cold medicines. Medicines to reduce coughing (cough suppressants). Coughing is a type of defense against infection that helps to clear the nose, throat, windpipe, and lungs (respiratory system). Take these medicines only as told by your  doctor. Medicines to lower your fever. Follow these instructions at home: Activity Rest as needed. If you have a fever, stay home from work or school until your fever is gone, or until your doctor says you may return to work or school. You should stay home until you cannot spread the infection anymore (you are not contagious). Your doctor may have you wear a face mask so you have less risk of spreading the infection. Relieving symptoms Rinse your mouth often with salt water. To make salt water, dissolve -1 tsp (3-6 g) of salt in 1 cup (237 mL) of warm water. Use a cool-mist humidifier to add moisture to the air. This can help you breathe more easily. Eating and drinking  Drink enough fluid to keep your pee (urine) pale yellow. Eat soups and other clear broths. General instructions  Take over-the-counter and prescription medicines only as told by your doctor. Do not smoke or use any products that contain nicotine or tobacco. If you need help quitting, ask your doctor. Avoid being where people are smoking (avoid secondhand smoke). Stay up to date on all your shots (immunizations), and get the flu shot every year. Keep all follow-up visits. How to prevent the spread of infection to others  Wash your hands with soap and water for at least 20 seconds. If you cannot use soap and water, use hand sanitizer. Avoid touching your mouth, face, eyes, or nose. Cough or sneeze into a tissue or your sleeve or elbow. Do not cough or sneeze into your hand or into the air. Contact a doctor if:  You are getting worse, not better. You have any of these: A fever or chills. Brown or red mucus in your nose. Yellow or brown fluid (discharge)coming from your nose. Pain in your face, especially when you bend forward. Swollen neck glands. Pain when you swallow. White areas in the back of your throat. Get help right away if: You have shortness of breath that gets worse. You have very bad or  constant: Headache. Ear pain. Pain in your forehead, behind your eyes, and over your cheekbones (sinus pain). Chest pain. You have long-lasting (chronic) lung disease along with any of these: Making high-pitched whistling sounds when you breathe, most often when you breathe out (wheezing). Long-lasting cough (more than 14 days). Coughing up blood. A change in your usual mucus. You have a stiff neck. You have changes in your: Vision. Hearing. Thinking. Mood. These symptoms may be an emergency. Get help right away. Call 911. Do not wait to see if the symptoms will go away. Do not drive yourself to the hospital. Summary An upper respiratory infection (URI) is caused by a germ (virus). The most common type of URI is often called the common cold. URIs usually get better within 7-10 days. Take over-the-counter and prescription medicines only as told by your doctor. This information is not intended to replace advice given to you by your health care provider. Make sure you discuss any questions you have with your health care provider. Document Revised: 10/23/2020 Document Reviewed: 10/23/2020 Elsevier Patient Education  [place].

## 2022-03-04 ENCOUNTER — Encounter: Payer: Self-pay | Admitting: Nurse Practitioner

## 2022-03-04 ENCOUNTER — Other Ambulatory Visit: Payer: Self-pay | Admitting: Nurse Practitioner

## 2022-03-04 LAB — URINALYSIS, ROUTINE W REFLEX MICROSCOPIC
Bacteria, UA: NONE SEEN /HPF
Bilirubin Urine: NEGATIVE
Glucose, UA: NEGATIVE
Hgb urine dipstick: NEGATIVE
Nitrite: NEGATIVE
Specific Gravity, Urine: 1.022 (ref 1.001–1.035)
pH: 6.5 (ref 5.0–8.0)

## 2022-03-04 LAB — URINE CULTURE
MICRO NUMBER:: 14241794
Result:: NO GROWTH
SPECIMEN QUALITY:: ADEQUATE

## 2022-03-04 LAB — MICROSCOPIC MESSAGE

## 2022-03-04 MED ORDER — NITROFURANTOIN MONOHYD MACRO 100 MG PO CAPS: 100.0000 mg | ORAL_CAPSULE | Freq: Two times a day (BID) | ORAL | 0 refills | Status: AC

## 2022-03-17 ENCOUNTER — Encounter: Payer: Self-pay | Admitting: Nurse Practitioner

## 2022-03-17 ENCOUNTER — Ambulatory Visit (INDEPENDENT_AMBULATORY_CARE_PROVIDER_SITE_OTHER): Payer: Medicare HMO | Admitting: Nurse Practitioner

## 2022-03-17 VITALS — BP 126/72 | HR 82 | Temp 97.0°F | Ht 62.0 in | Wt 94.4 lb

## 2022-03-17 DIAGNOSIS — Z79899 Other long term (current) drug therapy: Secondary | ICD-10-CM

## 2022-03-17 DIAGNOSIS — R4181 Age-related cognitive decline: Secondary | ICD-10-CM

## 2022-03-17 DIAGNOSIS — E782 Mixed hyperlipidemia: Secondary | ICD-10-CM | POA: Diagnosis not present

## 2022-03-17 DIAGNOSIS — C8231 Follicular lymphoma grade IIIa, lymph nodes of head, face, and neck: Secondary | ICD-10-CM

## 2022-03-17 DIAGNOSIS — N183 Chronic kidney disease, stage 3 unspecified: Secondary | ICD-10-CM | POA: Diagnosis not present

## 2022-03-17 DIAGNOSIS — K2100 Gastro-esophageal reflux disease with esophagitis, without bleeding: Secondary | ICD-10-CM

## 2022-03-17 DIAGNOSIS — I10 Essential (primary) hypertension: Secondary | ICD-10-CM

## 2022-03-17 NOTE — Progress Notes (Signed)
FOLLOW UP   Assessment:    Essential hypertension Discussed DASH (Dietary Approaches to Stop Hypertension) DASH diet is lower in sodium than a typical American diet. Cut back on foods that are high in saturated fat, cholesterol, and trans fats. Eat more whole-grain foods, fish, poultry, and nuts Remain active and exercise as tolerated daily.  Monitor BP at home-Call if greater than 130/80.  Check CMP/CBC   Gastroesophageal reflux disease with esophagitis without hemorrhage No suspected reflux complications ([place]/stricture). Lifestyle modification:  wt loss, avoid meals 2-3h before bedtime. Consider eliminating food triggers:  chocolate, caffeine, EtOH, acid/spicy food.   Follicular lymphoma grade iiia, lymph nodes of head, face, and neck [facility]) Following Oncology Dr. Alvy Bimler.   Follow up as needed, no further scheduled. Continue to  monitor.  Stage 3 chronic kidney disease, unspecified whether stage 3a or 3b CKD ([place]) Discussed how what you eat and drink can aide in kidney protection. Stay well hydrated. Avoid high salt foods. Avoid NSAIDS. Keep BP and BG well controlled.   Take medications as prescribed. Remain active and exercise as tolerated daily. Maintain weight.  Continue to monitor. Check CMP/GFR/Microablumin  Hyperlipidemia, mixed Discussed lifestyle modifications. Recommended diet heavy in fruits and veggies, omega 3's. Decrease consumption of animal meats, cheeses, and dairy products. Remain active and exercise as tolerated. Continue to monitor. Check lipids/TSH   Medication management All medications were discussed at length. All questions and concerns addressed.   Age related cognitive decline Continue to stay well hydrated Consume a well balanced diet Remain active and exercise as tolerated Suggest word games/puzzles  Orders Placed This Encounter  Procedures   CBC with Differential/Platelet   COMPLETE METABOLIC PANEL WITH GFR   Lipid panel      Future Appointments  Date Time Provider [place]  08/06/2022 11:00 AM Darrol Jump, NP [facility] None  11/13/2022 11:00 AM Unk Pinto, MD [facility] None    .  Subjective:   MONSERATT Hawkins is a 81 y.o. female who presents along side friend for general follow up.  She is accompanied by her neighbor who reports is very involved in her care.  She is no longer driving. Neighbor and family provide transportation.   Overall she reports feeling well today.  She has no new concerns.   Patient is s/p treatment follicular lymphoma, right neck, following oncology Dr. Alvy Bimler.  Follow up as needed, no further scheduled.  She was started on nameda this year but stopped due to sedation. She is no longer taking any medication for memory. She does not wish to pursue this any further.   Dr. Felix Ahmadi, [place] 2022 had abnormal left optic nerve, had negative CBC, ESR, CRP and negative MRI of brain and orbits.    She eats tries to eat two meals a day but has cut down on cooking foods for herself.  She also drinks x2 Ensure shakes.  She has lost 3 lb over the last few weeks.  Was recently seen and treated for an URI which contributed to less nutritional intake.   BMI is Body mass index is 17.27 kg/m., she has been working on diet and exercise.  She has gained 3 lb over the last 3 mo. Wt Readings from Last 3 Encounters:  03/17/22 94 lb 6.4 oz (42.8 kg)  03/03/22 97 lb (44 kg)  02/11/22 97 lb (44 kg)   Her blood pressure has been controlled at home, today their BP is BP: 126/72 She does workout, still at [employer]. Walking  with a neighbor, lives byself.  She denies chest pain, shortness of breath, dizziness.   She is on cholesterol medication, she is not on a cholesterol medication. Her cholesterol is not at goal. The cholesterol last visit was:   Lab Results  Component Value Date   CHOL 148 11/12/2021   HDL 70 11/12/2021   LDLCALC 55 11/12/2021   TRIG 145  11/12/2021   CHOLHDL 2.1 11/12/2021   Lab Results  Component Value Date   GFRNONAA 73 08/28/2020   She has been working on diet and exercise for glucose management, and denies nausea, paresthesia of the feet and polydipsia. Last A1C in the office was:  Lab Results  Component Value Date   HGBA1C 5.4 11/12/2021   Patient is on Vitamin D supplement.   Lab Results  Component Value Date   VD25OH 43 11/12/2021     Lab Results  Component Value Date   GFRNONAA 73 08/28/2020   Medication Review   Current Outpatient Medications (Cardiovascular):    atenolol (TENORMIN) 50 MG tablet, Take 1 tablet  Daily for BP                                                                         /                         TAKE                      BY                         MOUTH   rosuvastatin (CRESTOR) 5 MG tablet, Take  1 tablet  Daily  for Cholesterol  Current Outpatient Medications (Respiratory):    albuterol (VENTOLIN HFA) 108 (90 Base) MCG/ACT inhaler, Inhale 2 puffs into the lungs every 6 (six) hours as needed for wheezing or shortness of breath.   Budeson-Glycopyrrol-Formoterol (BREZTRI AEROSPHERE) 160-9-4.8 MCG/ACT AERO, Use 2 inhalations  (15 minutes apart)   2 x /day  (every 12 hours)   Tiotropium Bromide-Olodaterol (STIOLTO RESPIMAT) 2.5-2.5 MCG/ACT AERS, Use  2 Inhalations  every day  for COPD /Emphysema   benzonatate (TESSALON PERLES) 100 MG capsule, Take 1 capsule (100 mg total) by mouth every 6 (six) hours as needed for cough. (Patient not taking: Reported on 03/17/2022)   promethazine-dextromethorphan (PROMETHAZINE-DM) 6.25-15 MG/5ML syrup, Take 5 mLs by mouth 4 (four) times daily as needed for cough. (Patient not taking: Reported on 03/17/2022)  Current Outpatient Medications (Analgesics):    aspirin EC 81 MG tablet, Take 81 mg by mouth daily.   Current Outpatient Medications (Other):    Ascorbic Acid (VITAMIN C) 1000 MG tablet, Take 1,000 mg by mouth daily.   Cholecalciferol  (VITAMIN D) 50 MCG (2000 UT) CAPS, Take by mouth.   Multiple Vitamins-Minerals (ZINC PO), Take by mouth.   sertraline (ZOLOFT) 25 MG tablet, Take 1 tablet (25 mg total) by mouth daily.   potassium chloride (K-DUR,KLOR-CON) 10 MEQ tablet, Take 10-20 mEq by mouth See admin instructions. 44mq in the AM and in the PM (Patient not taking: Reported on 02/11/2022)  Current Problems (verified) Patient Active Problem List  Diagnosis Date Noted   Nocturnal hypoxemia due to obstructive chronic bronchitis 09/23/2020   OSA (obstructive sleep apnea) 09/23/2020   Mild malnutrition ([place]) 05/25/2019   Left displaced femoral neck fracture ([place]) 01/18/2018   Lumbar radiculopathy 01/18/2018   Aortic atherosclerosis ([place]) by Chest CTscan on 05/05/2018 01/10/2018   Anemia, chronic disease 12/13/2017   Hypokalemia 24/58/0998   Follicular lymphoma grade iiia, lymph nodes of head, face, and neck ([place]) 02/11/2016   OSA and COPD overlap syndrome ([place]) 12/05/2014   Osteoporosis 12/05/2014   Medication management 03/06/2014   CKD (chronic kidney disease) stage 3, GFR 30-59 ml/min ([place]) 11/27/2013   COPD (chronic obstructive pulmonary disease) with emphysema ([place]) 11/27/2013   Essential hypertension    GERD     Hx of adenomatous colonic polyps    Hyperlipidemia, mixed    Abnormal glucose    Vitamin D deficiency    RLS (restless legs syndrome)    Vitamin B12 deficiency    Anxiety    Allergies No Known Allergies   Screening Tests: Immunization History  Administered Date(s) Administered   Influenza Split 01/18/2013   Influenza, High Dose Seasonal PF 12/05/2014, 01/23/2016, 12/22/2016, 01/11/2018, 12/22/2018   Influenza-Unspecified 01/17/2018   PFIZER(Purple Top)SARS-COV-2 Vaccination 05/11/2019, 06/01/2019   Pneumococcal Conjugate-13 11/27/2013   Pneumococcal Polysaccharide-23 11/19/2011   Td 08/13/2008   Zoster, Live 04/06/2010     Names of Other Physician/Practitioners you currently use: 1.  [facility]- here for primary care 2. [place] [facility] [place] eye doctor, 2022 3. Dentist, 08/2020  Patient Care Team: Unk Pinto, MD as PCP - General (Internal Medicine) Lafayette Dragon, MD (Inactive) as Consulting Physician (Gastroenterology) Loletha Carrow, MD (Pulmonary Disease) Heath Lark, MD as Consulting Physician (Hematology and Oncology) Rozetta Nunnery, MD as Consulting Physician (Otolaryngology) Chales Salmon, OD as Consulting Physician (Optometry)  SURGICAL HISTORY She  has a past surgical history that includes Appendectomy; Tonsillectomy; Colonoscopy (N/A, 05/18/2014); Lymph node biopsy (Right, 02/04/2016); Direct laryngoscopy (Right, 02/04/2016); Total hip arthroplasty (Left, 10/21/2017); and Cataract extraction, bilateral (2017). FAMILY HISTORY Her family history includes Stroke in her maternal grandmother and mother. SOCIAL HISTORY She  reports that she quit smoking about 9 years ago. Her smoking use included cigarettes. She has a 30.00 pack-year smoking history. She has never used smokeless tobacco. She reports that she does not drink alcohol and does not use drugs.   Review of Systems  Constitutional: Negative.   HENT: Negative.    Respiratory:  Negative for cough, hemoptysis, sputum production, shortness of breath (improved with CPAP, not using O2 anymore) and wheezing.   Cardiovascular: Negative.   Gastrointestinal:  Negative for abdominal pain, blood in stool, constipation, diarrhea, heartburn, melena, nausea and vomiting.  Genitourinary:  Negative for dysuria, flank pain, frequency, hematuria and urgency.  Musculoskeletal: Negative.  Negative for falls.  Skin: Negative.   Neurological:  Negative for dizziness, tingling, tremors, sensory change, speech change, focal weakness, seizures and loss of consciousness.  Psychiatric/Behavioral:  Positive for memory loss. Negative for depression, hallucinations, substance  abuse and suicidal ideas. The patient is not nervous/anxious and does not have insomnia.     Objective:     Blood pressure 126/72, pulse 82, temperature (!) 97 F (36.1 C), height _0  (1.575 m), weight 94 lb 6.4 oz (42.8 kg), SpO2 97 %. Body mass index is 17.27 kg/m.  General appearance: Thin, alert, no distress, WD/WN,  female HEENT: normocephalic, sclerae anicteric, TMs pearly, nares patent, no discharge or erythema,  pharynx normal Oral cavity: MMM, no lesions Neck: supple,  no thyromegaly, no masses Heart: RRR, normal S1, S2, no murmurs Lungs: CTA bilaterally, no wheezes, rhonchi, or rales Abdomen: +bs, soft, non tender, no rebound, distended, no masses, no hepatomegaly, no splenomegaly Musculoskeletal: nontender, no swelling, no obvious deformity Extremities: no edema, no cyanosis, no clubbing Pulses: 2+ symmetric, upper and lower extremities, normal cap refill Neurological: alert, oriented x 3, CN2-12 intact, strength normal upper extremities and lower extremities,DTRs 2+ throughout, no cerebellar signs, gait slow, antalgic Psychiatric: normal affect, behavior normal, pleasant   Darrol Jump, NP [facility] 03/17/2022  11:43 AM

## 2022-03-18 LAB — COMPLETE METABOLIC PANEL WITH GFR
AG Ratio: 1.5 (calc) (ref 1.0–2.5)
ALT: 20 U/L (ref 6–29)
AST: 17 U/L (ref 10–35)
Albumin: 4 g/dL (ref 3.6–5.1)
Alkaline phosphatase (APISO): 63 U/L (ref 37–153)
BUN: 13 mg/dL (ref 7–25)
CO2: 30 mmol/L (ref 20–32)
Calcium: 9.5 mg/dL (ref 8.6–10.4)
Chloride: 101 mmol/L (ref 98–110)
Creat: 0.92 mg/dL (ref 0.60–1.00)
Globulin: 2.6 g/dL (calc) (ref 1.9–3.7)
Glucose, Bld: 79 mg/dL (ref 65–99)
Potassium: 4.4 mmol/L (ref 3.5–5.3)
Sodium: 142 mmol/L (ref 135–146)
Total Bilirubin: 0.4 mg/dL (ref 0.2–1.2)
Total Protein: 6.6 g/dL (ref 6.1–8.1)
eGFR: 63 mL/min/{1.73_m2} (ref >=60)

## 2022-03-18 LAB — CBC WITH DIFFERENTIAL/PLATELET
Absolute Monocytes: 461 cells/uL (ref 200–950)
Basophils Absolute: 44 cells/uL (ref 0–200)
Basophils Relative: 0.5 %
Eosinophils Absolute: 61 cells/uL (ref 15–500)
Eosinophils Relative: 0.7 %
HCT: 44.2 % (ref 35.0–45.0)
Hemoglobin: 14.6 g/dL (ref 11.7–15.5)
Lymphs Abs: 1166 cells/uL (ref 850–3900)
MCH: 31.5 pg (ref 27.0–33.0)
MCHC: 33 g/dL (ref 32.0–36.0)
MCV: 95.5 fL (ref 80.0–100.0)
MPV: 10 fL (ref 7.5–12.5)
Monocytes Relative: 5.3 %
Neutro Abs: 6969 cells/uL (ref 1500–7800)
Neutrophils Relative %: 80.1 %
Platelets: 270 10*3/uL (ref 140–400)
RBC: 4.63 10*6/uL (ref 3.80–5.10)
RDW: 12.4 % (ref 11.0–15.0)
Total Lymphocyte: 13.4 %
WBC: 8.7 10*3/uL (ref 3.8–10.8)

## 2022-03-18 LAB — LIPID PANEL
Cholesterol: 235 mg/dL — ABNORMAL HIGH (ref <200)
HDL: 67 mg/dL (ref >=50)
LDL Cholesterol (Calc): 136 mg/dL (calc) — ABNORMAL HIGH
Non-HDL Cholesterol (Calc): 168 mg/dL (calc) — ABNORMAL HIGH (ref <130)
Total CHOL/HDL Ratio: 3.5 (calc) (ref <5.0)
Triglycerides: 180 mg/dL — ABNORMAL HIGH (ref <150)

## 2022-04-13 ENCOUNTER — Telehealth: Payer: Self-pay | Admitting: Nurse Practitioner

## 2022-04-13 MED ORDER — SERTRALINE HCL 25 MG PO TABS: 25.0000 mg | ORAL_TABLET | Freq: Every day | ORAL | 0 refills | Status: AC

## 2022-04-13 NOTE — Addendum Note (Signed)
Addended by: Chancy Hurter on: 04/13/2022 04:30 PM   Modules accepted: Orders

## 2022-04-13 NOTE — Telephone Encounter (Signed)
Pt is requesting Sertraline to be sent to [facility]

## 2022-04-26 ENCOUNTER — Other Ambulatory Visit: Payer: Self-pay | Admitting: Internal Medicine

## 2022-04-26 DIAGNOSIS — I10 Essential (primary) hypertension: Secondary | ICD-10-CM

## 2022-04-26 MED ORDER — OLMESARTAN MEDOXOMIL 20 MG PO TABS: ORAL_TABLET | ORAL | 3 refills | Status: DC

## 2022-04-30 NOTE — Progress Notes (Signed)
Assessment and Plan:  Diagnoses and all orders for this visit:  Essential hypertension - continue medications:  Olmesartan 20 mg QPM and takes Atenolol 50 mg in am only if BP greater than 130/80 - Continue DASH diet, exercise and monitor at home. Call if greater than 130/80.  -     CBC with Differential/Platelet -     COMPLETE METABOLIC PANEL WITH GFR   Medication management -     CBC with Differential/Platelet -     TSH -     Urinalysis, Routine w reflex microscopic -     Urine Culture -     COMPLETE METABOLIC PANEL WITH GFR  Shortness of breath on exertion She has been out of her stiolto x several days and is coming from mail order Prescription for stiolto sent to local pharmacy to restart today If she should develop shortness of breath and is unable to feel like she can get a deep breath or pain with breathing she is to go to the ER -     COMPLETE METABOLIC PANEL WITH GFR  OSA and COPD overlap syndrome (HCC) Continue to use inhaler and CPAP regularly -     Tiotropium Bromide-Olodaterol (STIOLTO RESPIMAT) 2.5-2.5 MCG/ACT AERS; Use  2 Inhalations  every day  for COPD /Emphysema  Low body temperature Temp is normal in the office today Will check labs and continue to monitor -     CBC with Differential/Platelet -     TSH -     Urinalysis, Routine w reflex microscopic -     Urine Culture -     COMPLETE METABOLIC PANEL WITH GFR  Muscle spasm of neck Advised to do stretching exercises to neck several times a day This is most likely cause of headache today If pain worsens notify the office    Further disposition pending results of labs. Discussed med's effects and SE's.   Over 30 minutes of exam, counseling, chart review, and critical decision making was performed.   Future Appointments  Date Time Provider [place]  07/02/2022 11:00 AM Darrol Jump, NP [facility] None  11/13/2022 11:00 AM Unk Pinto, MD [facility] None     ------------------------------------------------------------------------------------------------------------------   HPI BP 124/70   Pulse 65   Temp (!) 97.5 F (36.4 C)   Ht '5\' 2"'$  (1.575 m)   Wt 99 lb 9.6 oz (45.2 kg)   SpO2 95%   BMI 18.22 kg/m    82 y.o.female presents for complaints of low temperature(states they check several times and temp was 94) with elevated blood pressure for the past several days. She is also having a headache today, starts at base of skull.. States she has had some shortness of breath with sitting and exertion. Denies chest pain, wheezing, nausea, vomiting, diarrhea and sore throat.   She takes Albuterol as needed and Stiolto 2.5/2.5 mg daily but has been out for several days.  Wearing her CPAP regularly.    Patient has been concerned about elevated blood pressure. She is currently on Atenolol 50 mg QD if BP greater than 130/80. Started recently Olmesartan 20 mg QHS.  BP Readings from Last 3 Encounters:  05/01/22 124/70  03/17/22 126/72  03/03/22 (!) 110/50    BMI is Body mass index is 18.22 kg/m., she has been working on diet and exercise. Wt Readings from Last 3 Encounters:  05/01/22 99 lb 9.6 oz (45.2 kg)  03/17/22 94 lb 6.4 oz (42.8 kg)  03/03/22 97 lb (44 kg)    Past  Medical History:  Diagnosis Date   Anxiety    COPD (chronic obstructive pulmonary disease) ([place])    Diverticulosis    Diverticulosis of colon with hemorrhage    GERD (gastroesophageal reflux disease)    H/O total knee replacement, left 11/05/2017   Hx of adenomatous colonic polyps    Hyperlipidemia    Hypertension    Hypomagnesemia    Neuropathy    Peripheral neuropathy    Prediabetes    RLS (restless legs syndrome)    Sleep apnea    CPAP   Vitamin B12 deficiency    Vitamin D deficiency      No Known Allergies  Current Outpatient Medications on File Prior to Visit  Medication Sig   albuterol (VENTOLIN HFA) 108 (90 Base) MCG/ACT inhaler Inhale 2 puffs into  the lungs every 6 (six) hours as needed for wheezing or shortness of breath.   Ascorbic Acid (VITAMIN C) 1000 MG tablet Take 1,000 mg by mouth daily.   aspirin EC 81 MG tablet Take 81 mg by mouth daily.   atenolol (TENORMIN) 50 MG tablet Take 1 tablet  Daily for BP                                                                         /                         TAKE                      BY                         MOUTH   Budeson-Glycopyrrol-Formoterol (BREZTRI AEROSPHERE) 160-9-4.8 MCG/ACT AERO Use 2 inhalations  (15 minutes apart)   2 x /day  (every 12 hours)   Cholecalciferol (VITAMIN D) 50 MCG (2000 UT) CAPS Take by mouth.   Multiple Vitamins-Minerals (ZINC PO) Take by mouth.   olmesartan (BENICAR) 20 MG tablet Take  tablet  at Night  for BP   rosuvastatin (CRESTOR) 5 MG tablet Take  1 tablet  Daily  for Cholesterol   sertraline (ZOLOFT) 25 MG tablet Take 1 tablet (25 mg total) by mouth daily.   Tiotropium Bromide-Olodaterol (STIOLTO RESPIMAT) 2.5-2.5 MCG/ACT AERS Use  2 Inhalations  every day  for COPD /Emphysema   No current facility-administered medications on file prior to visit.    ROS: all negative except above.   Physical Exam:  BP 124/70   Pulse 65   Temp (!) 97.5 F (36.4 C)   Ht '5\' 2"'$  (1.575 m)   Wt 99 lb 9.6 oz (45.2 kg)   SpO2 95%   BMI 18.22 kg/m   General Appearance: Thin, pleasant female, in no apparent distress. Eyes: PERRLA, EOMs, conjunctiva no swelling or erythema Sinuses: No Frontal/maxillary tenderness ENT/Mouth: Ext aud canals clear, TMs without erythema, bulging. No erythema, swelling, or exudate on post pharynx.  Hearing normal.  Neck: Supple, thyroid normal.  Respiratory: Respiratory effort normal, BS equal bilaterally without rales, rhonchi. Expiratory wheezing noted of right upper lobe Cardio: RRR with no MRGs. Brisk peripheral pulses without edema.  Abdomen: Soft, + BS.  Non  tender, no guarding, rebound, hernias, masses. Lymphatics: Non tender  without lymphadenopathy.  Musculoskeletal: Full ROM, 5/5 strength, normal gait. Muscle spasm noted of left neck Skin: Warm, dry without rashes, lesions, ecchymosis.  Neuro: Cranial nerves intact. Normal muscle tone, no cerebellar symptoms. Sensation intact.  Psych: Awake and oriented to person, normal affect.    Alycia Rossetti, NP 10:46 AM [facility]

## 2022-05-01 ENCOUNTER — Encounter: Payer: Self-pay | Admitting: Nurse Practitioner

## 2022-05-01 ENCOUNTER — Ambulatory Visit (INDEPENDENT_AMBULATORY_CARE_PROVIDER_SITE_OTHER): Payer: Medicare HMO | Admitting: Nurse Practitioner

## 2022-05-01 VITALS — BP 124/70 | HR 65 | Temp 97.5°F | Ht 62.0 in | Wt 99.6 lb

## 2022-05-01 DIAGNOSIS — J449 Chronic obstructive pulmonary disease, unspecified: Secondary | ICD-10-CM | POA: Diagnosis not present

## 2022-05-01 DIAGNOSIS — M62838 Other muscle spasm: Secondary | ICD-10-CM | POA: Diagnosis not present

## 2022-05-01 DIAGNOSIS — Z79899 Other long term (current) drug therapy: Secondary | ICD-10-CM

## 2022-05-01 DIAGNOSIS — G4733 Obstructive sleep apnea (adult) (pediatric): Secondary | ICD-10-CM

## 2022-05-01 DIAGNOSIS — R0602 Shortness of breath: Secondary | ICD-10-CM

## 2022-05-01 DIAGNOSIS — I10 Essential (primary) hypertension: Secondary | ICD-10-CM | POA: Diagnosis not present

## 2022-05-01 DIAGNOSIS — R6889 Other general symptoms and signs: Secondary | ICD-10-CM | POA: Diagnosis not present

## 2022-05-01 DIAGNOSIS — R509 Fever, unspecified: Secondary | ICD-10-CM | POA: Diagnosis not present

## 2022-05-01 MED ORDER — STIOLTO RESPIMAT 2.5-2.5 MCG/ACT IN AERS: INHALATION_SPRAY | RESPIRATORY_TRACT | 3 refills | Status: DC

## 2022-05-01 NOTE — Patient Instructions (Signed)
Muscle Cramps and Spasms Muscle cramps and spasms are when muscles tighten by themselves. They usually get better within minutes. Muscle cramps are painful. They are usually stronger and last longer than muscle spasms. Muscle spasms may or may not be painful. They can last a few seconds or much longer. Cramps and spasms can affect any muscle, but they occur most often in the calf muscles of the leg. They are usually not caused by a serious problem. In many cases, the cause is not known. Some common causes include: Doing more physical work or exercise than your body is ready for. Using the muscles too much (overuse) by repeating certain movements too many times. Staying in a certain position for a long time. Playing a sport or doing an activity without preparing properly. Using bad form or technique while playing a sport or doing an activity. Not having enough water in your body (dehydration). Injury. Side effects of some medicines. Low levels of the salts and minerals in your blood (electrolytes), such as low potassium or calcium. Follow these instructions at home: Managing pain and stiffness     Massage, stretch, and relax the muscle. Do this for many minutes at a time. If told, put heat on tight or tense muscles as often as told by your doctor. Use the heat source that your doctor recommends, such as a moist heat pack or a heating pad. Place a towel between your skin and the heat source. Leave the heat on for 20-30 minutes. Remove the heat if your skin turns bright red. This is very important if you are not able to feel pain, heat, or cold. You may have a greater risk of getting burned. If told, put ice on the affected area. This may help if you are sore or have pain after a cramp or spasm. Put ice in a plastic bag. Place a towel between your skin and the bag. Leave the ice on for 20 minutes, 2-3 times a day. Try taking hot showers or baths to help relax tight muscles. Eating and  drinking Drink enough fluid to keep your pee (urine) pale yellow. Eat a healthy diet to help ensure that your muscles work well. This should include: Fruits and vegetables. Lean protein. Whole grains. Low-fat or nonfat dairy products. General instructions If you are having cramps often, avoid intense exercise for several days. Take over-the-counter and prescription medicines only as told by your doctor. Watch for any changes in your symptoms. Keep all follow-up visits as told by your doctor. This is important. Contact a doctor if: Your cramps or spasms get worse or happen more often. Your cramps or spasms do not get better with time. Summary Muscle cramps and spasms are when muscles tighten by themselves. They usually get better within minutes. Cramps and spasms occur most often in the calf muscles of the leg. Massage, stretch, and relax the muscle. This may help the cramp or spasm go away. Drink enough fluid to keep your pee (urine) pale yellow. This information is not intended to replace advice given to you by your health care provider. Make sure you discuss any questions you have with your health care provider. Document Revised: 10/11/2020 Document Reviewed: 10/11/2020 [employer] Patient Education  [place].

## 2022-05-02 LAB — URINALYSIS, ROUTINE W REFLEX MICROSCOPIC
Bilirubin Urine: NEGATIVE
Glucose, UA: NEGATIVE
Hgb urine dipstick: NEGATIVE
Ketones, ur: NEGATIVE
Leukocytes,Ua: NEGATIVE
Nitrite: NEGATIVE
Protein, ur: NEGATIVE
Specific Gravity, Urine: 1.016 (ref 1.001–1.035)
pH: 7 (ref 5.0–8.0)

## 2022-05-02 LAB — COMPLETE METABOLIC PANEL WITH GFR
AG Ratio: 2 (calc) (ref 1.0–2.5)
ALT: 11 U/L (ref 6–29)
AST: 17 U/L (ref 10–35)
Albumin: 4.5 g/dL (ref 3.6–5.1)
Alkaline phosphatase (APISO): 60 U/L (ref 37–153)
BUN: 19 mg/dL (ref 7–25)
CO2: 32 mmol/L (ref 20–32)
Calcium: 9.5 mg/dL (ref 8.6–10.4)
Chloride: 99 mmol/L (ref 98–110)
Creat: 0.82 mg/dL (ref 0.60–1.00)
Globulin: 2.2 g/dL (calc) (ref 1.9–3.7)
Glucose, Bld: 85 mg/dL (ref 65–99)
Potassium: 4.2 mmol/L (ref 3.5–5.3)
Sodium: 141 mmol/L (ref 135–146)
Total Bilirubin: 0.6 mg/dL (ref 0.2–1.2)
Total Protein: 6.7 g/dL (ref 6.1–8.1)
eGFR: 73 mL/min/{1.73_m2} (ref >=60)

## 2022-05-02 LAB — CBC WITH DIFFERENTIAL/PLATELET
Absolute Monocytes: 440 cells/uL (ref 200–950)
Basophils Absolute: 31 cells/uL (ref 0–200)
Basophils Relative: 0.5 %
Eosinophils Absolute: 198 cells/uL (ref 15–500)
Eosinophils Relative: 3.2 %
HCT: 40.8 % (ref 35.0–45.0)
Hemoglobin: 13.7 g/dL (ref 11.7–15.5)
Lymphs Abs: 1234 cells/uL (ref 850–3900)
MCH: 31.5 pg (ref 27.0–33.0)
MCHC: 33.6 g/dL (ref 32.0–36.0)
MCV: 93.8 fL (ref 80.0–100.0)
MPV: 11.2 fL (ref 7.5–12.5)
Monocytes Relative: 7.1 %
Neutro Abs: 4297 cells/uL (ref 1500–7800)
Neutrophils Relative %: 69.3 %
Platelets: 160 10*3/uL (ref 140–400)
RBC: 4.35 10*6/uL (ref 3.80–5.10)
RDW: 12.1 % (ref 11.0–15.0)
Total Lymphocyte: 19.9 %
WBC: 6.2 10*3/uL (ref 3.8–10.8)

## 2022-05-02 LAB — URINE CULTURE
MICRO NUMBER:: 14480695
Result:: NO GROWTH
SPECIMEN QUALITY:: ADEQUATE

## 2022-05-02 LAB — TSH: TSH: 1.35 mIU/L (ref 0.40–4.50)

## 2022-05-05 ENCOUNTER — Encounter: Payer: Self-pay | Admitting: Internal Medicine

## 2022-05-31 ENCOUNTER — Encounter: Payer: Self-pay | Admitting: Nurse Practitioner

## 2022-06-03 DIAGNOSIS — J441 Chronic obstructive pulmonary disease with (acute) exacerbation: Secondary | ICD-10-CM | POA: Diagnosis not present

## 2022-06-03 DIAGNOSIS — F0390 Unspecified dementia without behavioral disturbance: Secondary | ICD-10-CM | POA: Diagnosis not present

## 2022-06-03 DIAGNOSIS — D696 Thrombocytopenia, unspecified: Secondary | ICD-10-CM | POA: Diagnosis not present

## 2022-06-03 DIAGNOSIS — I10 Essential (primary) hypertension: Secondary | ICD-10-CM | POA: Diagnosis not present

## 2022-06-03 DIAGNOSIS — I5189 Other ill-defined heart diseases: Secondary | ICD-10-CM | POA: Diagnosis not present

## 2022-06-03 DIAGNOSIS — R0902 Hypoxemia: Secondary | ICD-10-CM | POA: Diagnosis not present

## 2022-06-03 DIAGNOSIS — B338 Other specified viral diseases: Secondary | ICD-10-CM | POA: Diagnosis not present

## 2022-06-03 DIAGNOSIS — F32A Depression, unspecified: Secondary | ICD-10-CM | POA: Diagnosis not present

## 2022-06-03 DIAGNOSIS — J210 Acute bronchiolitis due to respiratory syncytial virus: Secondary | ICD-10-CM | POA: Diagnosis not present

## 2022-06-03 DIAGNOSIS — F0393 Unspecified dementia, unspecified severity, with mood disturbance: Secondary | ICD-10-CM | POA: Diagnosis not present

## 2022-06-03 DIAGNOSIS — K222 Esophageal obstruction: Secondary | ICD-10-CM | POA: Diagnosis not present

## 2022-06-03 DIAGNOSIS — R059 Cough, unspecified: Secondary | ICD-10-CM | POA: Diagnosis not present

## 2022-06-03 DIAGNOSIS — K449 Diaphragmatic hernia without obstruction or gangrene: Secondary | ICD-10-CM | POA: Diagnosis not present

## 2022-06-03 DIAGNOSIS — R0602 Shortness of breath: Secondary | ICD-10-CM | POA: Diagnosis not present

## 2022-06-03 DIAGNOSIS — F03911 Unspecified dementia, unspecified severity, with agitation: Secondary | ICD-10-CM | POA: Diagnosis not present

## 2022-06-03 DIAGNOSIS — E785 Hyperlipidemia, unspecified: Secondary | ICD-10-CM | POA: Diagnosis not present

## 2022-06-03 DIAGNOSIS — Z87891 Personal history of nicotine dependence: Secondary | ICD-10-CM | POA: Diagnosis not present

## 2022-06-03 DIAGNOSIS — C8231 Follicular lymphoma grade IIIa, lymph nodes of head, face, and neck: Secondary | ICD-10-CM | POA: Diagnosis not present

## 2022-06-03 DIAGNOSIS — I340 Nonrheumatic mitral (valve) insufficiency: Secondary | ICD-10-CM | POA: Diagnosis not present

## 2022-06-03 DIAGNOSIS — R9431 Abnormal electrocardiogram [ECG] [EKG]: Secondary | ICD-10-CM | POA: Diagnosis not present

## 2022-06-03 DIAGNOSIS — J439 Emphysema, unspecified: Secondary | ICD-10-CM | POA: Diagnosis not present

## 2022-06-03 DIAGNOSIS — B974 Respiratory syncytial virus as the cause of diseases classified elsewhere: Secondary | ICD-10-CM | POA: Diagnosis not present

## 2022-06-03 DIAGNOSIS — J9601 Acute respiratory failure with hypoxia: Secondary | ICD-10-CM | POA: Diagnosis not present

## 2022-06-03 DIAGNOSIS — Z20822 Contact with and (suspected) exposure to covid-19: Secondary | ICD-10-CM | POA: Diagnosis not present

## 2022-06-04 DIAGNOSIS — J441 Chronic obstructive pulmonary disease with (acute) exacerbation: Secondary | ICD-10-CM | POA: Diagnosis not present

## 2022-06-04 DIAGNOSIS — R0902 Hypoxemia: Secondary | ICD-10-CM | POA: Diagnosis not present

## 2022-06-04 DIAGNOSIS — B338 Other specified viral diseases: Secondary | ICD-10-CM | POA: Diagnosis not present

## 2022-06-04 DIAGNOSIS — F0390 Unspecified dementia without behavioral disturbance: Secondary | ICD-10-CM | POA: Diagnosis not present

## 2022-06-05 DIAGNOSIS — R0902 Hypoxemia: Secondary | ICD-10-CM | POA: Diagnosis not present

## 2022-06-05 DIAGNOSIS — F0390 Unspecified dementia without behavioral disturbance: Secondary | ICD-10-CM | POA: Diagnosis not present

## 2022-06-05 DIAGNOSIS — I340 Nonrheumatic mitral (valve) insufficiency: Secondary | ICD-10-CM | POA: Diagnosis not present

## 2022-06-05 DIAGNOSIS — B338 Other specified viral diseases: Secondary | ICD-10-CM | POA: Diagnosis not present

## 2022-06-05 DIAGNOSIS — I5189 Other ill-defined heart diseases: Secondary | ICD-10-CM | POA: Diagnosis not present

## 2022-06-05 DIAGNOSIS — J441 Chronic obstructive pulmonary disease with (acute) exacerbation: Secondary | ICD-10-CM | POA: Diagnosis not present

## 2022-06-06 DIAGNOSIS — J210 Acute bronchiolitis due to respiratory syncytial virus: Secondary | ICD-10-CM | POA: Diagnosis not present

## 2022-06-06 DIAGNOSIS — I10 Essential (primary) hypertension: Secondary | ICD-10-CM | POA: Diagnosis not present

## 2022-06-06 DIAGNOSIS — F0390 Unspecified dementia without behavioral disturbance: Secondary | ICD-10-CM | POA: Diagnosis not present

## 2022-06-06 DIAGNOSIS — R0902 Hypoxemia: Secondary | ICD-10-CM | POA: Diagnosis not present

## 2022-06-06 DIAGNOSIS — J441 Chronic obstructive pulmonary disease with (acute) exacerbation: Secondary | ICD-10-CM | POA: Diagnosis not present

## 2022-06-07 DIAGNOSIS — R0902 Hypoxemia: Secondary | ICD-10-CM | POA: Diagnosis not present

## 2022-06-07 DIAGNOSIS — J441 Chronic obstructive pulmonary disease with (acute) exacerbation: Secondary | ICD-10-CM | POA: Diagnosis not present

## 2022-06-07 DIAGNOSIS — J210 Acute bronchiolitis due to respiratory syncytial virus: Secondary | ICD-10-CM | POA: Diagnosis not present

## 2022-06-07 DIAGNOSIS — F0390 Unspecified dementia without behavioral disturbance: Secondary | ICD-10-CM | POA: Diagnosis not present

## 2022-06-07 DIAGNOSIS — I10 Essential (primary) hypertension: Secondary | ICD-10-CM | POA: Diagnosis not present

## 2022-06-08 DIAGNOSIS — J210 Acute bronchiolitis due to respiratory syncytial virus: Secondary | ICD-10-CM | POA: Diagnosis not present

## 2022-06-08 DIAGNOSIS — J441 Chronic obstructive pulmonary disease with (acute) exacerbation: Secondary | ICD-10-CM | POA: Diagnosis not present

## 2022-06-08 DIAGNOSIS — R0902 Hypoxemia: Secondary | ICD-10-CM | POA: Diagnosis not present

## 2022-06-08 DIAGNOSIS — F0390 Unspecified dementia without behavioral disturbance: Secondary | ICD-10-CM | POA: Diagnosis not present

## 2022-06-08 DIAGNOSIS — I10 Essential (primary) hypertension: Secondary | ICD-10-CM | POA: Diagnosis not present

## 2022-06-09 DIAGNOSIS — I10 Essential (primary) hypertension: Secondary | ICD-10-CM | POA: Diagnosis not present

## 2022-06-09 DIAGNOSIS — J210 Acute bronchiolitis due to respiratory syncytial virus: Secondary | ICD-10-CM | POA: Diagnosis not present

## 2022-06-09 DIAGNOSIS — J441 Chronic obstructive pulmonary disease with (acute) exacerbation: Secondary | ICD-10-CM | POA: Diagnosis not present

## 2022-06-09 DIAGNOSIS — R0902 Hypoxemia: Secondary | ICD-10-CM | POA: Diagnosis not present

## 2022-06-09 DIAGNOSIS — F0390 Unspecified dementia without behavioral disturbance: Secondary | ICD-10-CM | POA: Diagnosis not present

## 2022-06-10 DIAGNOSIS — J441 Chronic obstructive pulmonary disease with (acute) exacerbation: Secondary | ICD-10-CM | POA: Diagnosis not present

## 2022-06-11 DIAGNOSIS — J441 Chronic obstructive pulmonary disease with (acute) exacerbation: Secondary | ICD-10-CM | POA: Diagnosis not present

## 2022-06-12 DIAGNOSIS — J441 Chronic obstructive pulmonary disease with (acute) exacerbation: Secondary | ICD-10-CM | POA: Diagnosis not present

## 2022-06-13 DIAGNOSIS — J441 Chronic obstructive pulmonary disease with (acute) exacerbation: Secondary | ICD-10-CM | POA: Diagnosis not present

## 2022-06-14 DIAGNOSIS — J441 Chronic obstructive pulmonary disease with (acute) exacerbation: Secondary | ICD-10-CM | POA: Diagnosis not present

## 2022-06-15 DIAGNOSIS — J441 Chronic obstructive pulmonary disease with (acute) exacerbation: Secondary | ICD-10-CM | POA: Diagnosis not present

## 2022-06-16 DIAGNOSIS — J441 Chronic obstructive pulmonary disease with (acute) exacerbation: Secondary | ICD-10-CM | POA: Diagnosis not present

## 2022-06-17 DIAGNOSIS — F32A Depression, unspecified: Secondary | ICD-10-CM | POA: Diagnosis not present

## 2022-06-17 DIAGNOSIS — B974 Respiratory syncytial virus as the cause of diseases classified elsewhere: Secondary | ICD-10-CM | POA: Diagnosis not present

## 2022-06-17 DIAGNOSIS — E871 Hypo-osmolality and hyponatremia: Secondary | ICD-10-CM | POA: Diagnosis not present

## 2022-06-17 DIAGNOSIS — F0393 Unspecified dementia, unspecified severity, with mood disturbance: Secondary | ICD-10-CM | POA: Diagnosis not present

## 2022-06-17 DIAGNOSIS — E785 Hyperlipidemia, unspecified: Secondary | ICD-10-CM | POA: Diagnosis not present

## 2022-06-17 DIAGNOSIS — Z8572 Personal history of non-Hodgkin lymphomas: Secondary | ICD-10-CM | POA: Diagnosis not present

## 2022-06-17 DIAGNOSIS — J9601 Acute respiratory failure with hypoxia: Secondary | ICD-10-CM | POA: Diagnosis not present

## 2022-06-17 DIAGNOSIS — J441 Chronic obstructive pulmonary disease with (acute) exacerbation: Secondary | ICD-10-CM | POA: Diagnosis not present

## 2022-06-17 DIAGNOSIS — I10 Essential (primary) hypertension: Secondary | ICD-10-CM | POA: Diagnosis not present

## 2022-06-17 DIAGNOSIS — A000 Cholera due to Vibrio cholerae 01, biovar cholerae: Secondary | ICD-10-CM | POA: Diagnosis not present

## 2022-06-18 DIAGNOSIS — J441 Chronic obstructive pulmonary disease with (acute) exacerbation: Secondary | ICD-10-CM | POA: Diagnosis not present

## 2022-06-18 DIAGNOSIS — B974 Respiratory syncytial virus as the cause of diseases classified elsewhere: Secondary | ICD-10-CM | POA: Diagnosis not present

## 2022-06-18 DIAGNOSIS — I10 Essential (primary) hypertension: Secondary | ICD-10-CM | POA: Diagnosis not present

## 2022-06-18 DIAGNOSIS — F0393 Unspecified dementia, unspecified severity, with mood disturbance: Secondary | ICD-10-CM | POA: Diagnosis not present

## 2022-06-18 DIAGNOSIS — Z8572 Personal history of non-Hodgkin lymphomas: Secondary | ICD-10-CM | POA: Diagnosis not present

## 2022-06-18 DIAGNOSIS — E871 Hypo-osmolality and hyponatremia: Secondary | ICD-10-CM | POA: Diagnosis not present

## 2022-06-18 DIAGNOSIS — F32A Depression, unspecified: Secondary | ICD-10-CM | POA: Diagnosis not present

## 2022-06-18 DIAGNOSIS — E785 Hyperlipidemia, unspecified: Secondary | ICD-10-CM | POA: Diagnosis not present

## 2022-06-18 DIAGNOSIS — J9601 Acute respiratory failure with hypoxia: Secondary | ICD-10-CM | POA: Diagnosis not present

## 2022-06-23 DIAGNOSIS — B974 Respiratory syncytial virus as the cause of diseases classified elsewhere: Secondary | ICD-10-CM | POA: Diagnosis not present

## 2022-06-23 DIAGNOSIS — E871 Hypo-osmolality and hyponatremia: Secondary | ICD-10-CM | POA: Diagnosis not present

## 2022-06-23 DIAGNOSIS — J441 Chronic obstructive pulmonary disease with (acute) exacerbation: Secondary | ICD-10-CM | POA: Diagnosis not present

## 2022-06-23 DIAGNOSIS — Z8572 Personal history of non-Hodgkin lymphomas: Secondary | ICD-10-CM | POA: Diagnosis not present

## 2022-06-23 DIAGNOSIS — J9601 Acute respiratory failure with hypoxia: Secondary | ICD-10-CM | POA: Diagnosis not present

## 2022-06-23 DIAGNOSIS — F0393 Unspecified dementia, unspecified severity, with mood disturbance: Secondary | ICD-10-CM | POA: Diagnosis not present

## 2022-06-23 DIAGNOSIS — E785 Hyperlipidemia, unspecified: Secondary | ICD-10-CM | POA: Diagnosis not present

## 2022-06-23 DIAGNOSIS — F32A Depression, unspecified: Secondary | ICD-10-CM | POA: Diagnosis not present

## 2022-06-23 DIAGNOSIS — I10 Essential (primary) hypertension: Secondary | ICD-10-CM | POA: Diagnosis not present

## 2022-06-25 DIAGNOSIS — B974 Respiratory syncytial virus as the cause of diseases classified elsewhere: Secondary | ICD-10-CM | POA: Diagnosis not present

## 2022-06-25 DIAGNOSIS — J441 Chronic obstructive pulmonary disease with (acute) exacerbation: Secondary | ICD-10-CM | POA: Diagnosis not present

## 2022-06-25 DIAGNOSIS — F32A Depression, unspecified: Secondary | ICD-10-CM | POA: Diagnosis not present

## 2022-06-25 DIAGNOSIS — E785 Hyperlipidemia, unspecified: Secondary | ICD-10-CM | POA: Diagnosis not present

## 2022-06-25 DIAGNOSIS — Z8572 Personal history of non-Hodgkin lymphomas: Secondary | ICD-10-CM | POA: Diagnosis not present

## 2022-06-25 DIAGNOSIS — I10 Essential (primary) hypertension: Secondary | ICD-10-CM | POA: Diagnosis not present

## 2022-06-25 DIAGNOSIS — F0393 Unspecified dementia, unspecified severity, with mood disturbance: Secondary | ICD-10-CM | POA: Diagnosis not present

## 2022-06-25 DIAGNOSIS — E871 Hypo-osmolality and hyponatremia: Secondary | ICD-10-CM | POA: Diagnosis not present

## 2022-06-25 DIAGNOSIS — J9601 Acute respiratory failure with hypoxia: Secondary | ICD-10-CM | POA: Diagnosis not present

## 2022-06-26 ENCOUNTER — Encounter: Payer: Self-pay | Admitting: Internal Medicine

## 2022-06-26 ENCOUNTER — Encounter: Payer: Self-pay | Admitting: Nurse Practitioner

## 2022-06-26 DIAGNOSIS — F0150 Vascular dementia without behavioral disturbance: Secondary | ICD-10-CM | POA: Insufficient documentation

## 2022-06-30 ENCOUNTER — Encounter: Payer: Self-pay | Admitting: Internal Medicine

## 2022-06-30 DIAGNOSIS — E871 Hypo-osmolality and hyponatremia: Secondary | ICD-10-CM | POA: Diagnosis not present

## 2022-06-30 DIAGNOSIS — J441 Chronic obstructive pulmonary disease with (acute) exacerbation: Secondary | ICD-10-CM | POA: Diagnosis not present

## 2022-06-30 DIAGNOSIS — F32A Depression, unspecified: Secondary | ICD-10-CM | POA: Diagnosis not present

## 2022-06-30 DIAGNOSIS — I10 Essential (primary) hypertension: Secondary | ICD-10-CM | POA: Diagnosis not present

## 2022-06-30 DIAGNOSIS — E785 Hyperlipidemia, unspecified: Secondary | ICD-10-CM | POA: Diagnosis not present

## 2022-06-30 DIAGNOSIS — J9601 Acute respiratory failure with hypoxia: Secondary | ICD-10-CM | POA: Diagnosis not present

## 2022-06-30 DIAGNOSIS — F0393 Unspecified dementia, unspecified severity, with mood disturbance: Secondary | ICD-10-CM | POA: Diagnosis not present

## 2022-06-30 DIAGNOSIS — Z8572 Personal history of non-Hodgkin lymphomas: Secondary | ICD-10-CM | POA: Diagnosis not present

## 2022-06-30 DIAGNOSIS — B974 Respiratory syncytial virus as the cause of diseases classified elsewhere: Secondary | ICD-10-CM | POA: Diagnosis not present

## 2022-07-02 ENCOUNTER — Ambulatory Visit: Payer: Medicare HMO | Admitting: Nurse Practitioner

## 2022-07-06 DIAGNOSIS — E871 Hypo-osmolality and hyponatremia: Secondary | ICD-10-CM | POA: Diagnosis not present

## 2022-07-06 DIAGNOSIS — J441 Chronic obstructive pulmonary disease with (acute) exacerbation: Secondary | ICD-10-CM | POA: Diagnosis not present

## 2022-07-06 DIAGNOSIS — F0393 Unspecified dementia, unspecified severity, with mood disturbance: Secondary | ICD-10-CM | POA: Diagnosis not present

## 2022-07-06 DIAGNOSIS — E785 Hyperlipidemia, unspecified: Secondary | ICD-10-CM | POA: Diagnosis not present

## 2022-07-06 DIAGNOSIS — J9601 Acute respiratory failure with hypoxia: Secondary | ICD-10-CM | POA: Diagnosis not present

## 2022-07-06 DIAGNOSIS — F32A Depression, unspecified: Secondary | ICD-10-CM | POA: Diagnosis not present

## 2022-07-06 DIAGNOSIS — I10 Essential (primary) hypertension: Secondary | ICD-10-CM | POA: Diagnosis not present

## 2022-07-06 DIAGNOSIS — Z8572 Personal history of non-Hodgkin lymphomas: Secondary | ICD-10-CM | POA: Diagnosis not present

## 2022-07-06 DIAGNOSIS — B974 Respiratory syncytial virus as the cause of diseases classified elsewhere: Secondary | ICD-10-CM | POA: Diagnosis not present

## 2022-07-07 DIAGNOSIS — I10 Essential (primary) hypertension: Secondary | ICD-10-CM | POA: Diagnosis not present

## 2022-07-07 DIAGNOSIS — F32A Depression, unspecified: Secondary | ICD-10-CM | POA: Diagnosis not present

## 2022-07-07 DIAGNOSIS — E785 Hyperlipidemia, unspecified: Secondary | ICD-10-CM | POA: Diagnosis not present

## 2022-07-07 DIAGNOSIS — B974 Respiratory syncytial virus as the cause of diseases classified elsewhere: Secondary | ICD-10-CM | POA: Diagnosis not present

## 2022-07-07 DIAGNOSIS — Z8572 Personal history of non-Hodgkin lymphomas: Secondary | ICD-10-CM | POA: Diagnosis not present

## 2022-07-07 DIAGNOSIS — E871 Hypo-osmolality and hyponatremia: Secondary | ICD-10-CM | POA: Diagnosis not present

## 2022-07-07 DIAGNOSIS — F0393 Unspecified dementia, unspecified severity, with mood disturbance: Secondary | ICD-10-CM | POA: Diagnosis not present

## 2022-07-07 DIAGNOSIS — J441 Chronic obstructive pulmonary disease with (acute) exacerbation: Secondary | ICD-10-CM | POA: Diagnosis not present

## 2022-07-07 DIAGNOSIS — J9601 Acute respiratory failure with hypoxia: Secondary | ICD-10-CM | POA: Diagnosis not present

## 2022-07-14 DIAGNOSIS — I10 Essential (primary) hypertension: Secondary | ICD-10-CM | POA: Diagnosis not present

## 2022-07-14 DIAGNOSIS — F32A Depression, unspecified: Secondary | ICD-10-CM | POA: Diagnosis not present

## 2022-07-14 DIAGNOSIS — B974 Respiratory syncytial virus as the cause of diseases classified elsewhere: Secondary | ICD-10-CM | POA: Diagnosis not present

## 2022-07-14 DIAGNOSIS — Z8572 Personal history of non-Hodgkin lymphomas: Secondary | ICD-10-CM | POA: Diagnosis not present

## 2022-07-14 DIAGNOSIS — J9601 Acute respiratory failure with hypoxia: Secondary | ICD-10-CM | POA: Diagnosis not present

## 2022-07-14 DIAGNOSIS — E785 Hyperlipidemia, unspecified: Secondary | ICD-10-CM | POA: Diagnosis not present

## 2022-07-14 DIAGNOSIS — J441 Chronic obstructive pulmonary disease with (acute) exacerbation: Secondary | ICD-10-CM | POA: Diagnosis not present

## 2022-07-14 DIAGNOSIS — E871 Hypo-osmolality and hyponatremia: Secondary | ICD-10-CM | POA: Diagnosis not present

## 2022-07-14 DIAGNOSIS — F0393 Unspecified dementia, unspecified severity, with mood disturbance: Secondary | ICD-10-CM | POA: Diagnosis not present

## 2022-07-16 DIAGNOSIS — E785 Hyperlipidemia, unspecified: Secondary | ICD-10-CM | POA: Diagnosis not present

## 2022-07-16 DIAGNOSIS — B974 Respiratory syncytial virus as the cause of diseases classified elsewhere: Secondary | ICD-10-CM | POA: Diagnosis not present

## 2022-07-16 DIAGNOSIS — F0393 Unspecified dementia, unspecified severity, with mood disturbance: Secondary | ICD-10-CM | POA: Diagnosis not present

## 2022-07-16 DIAGNOSIS — F32A Depression, unspecified: Secondary | ICD-10-CM | POA: Diagnosis not present

## 2022-07-16 DIAGNOSIS — I10 Essential (primary) hypertension: Secondary | ICD-10-CM | POA: Diagnosis not present

## 2022-07-16 DIAGNOSIS — J9601 Acute respiratory failure with hypoxia: Secondary | ICD-10-CM | POA: Diagnosis not present

## 2022-07-16 DIAGNOSIS — Z8572 Personal history of non-Hodgkin lymphomas: Secondary | ICD-10-CM | POA: Diagnosis not present

## 2022-07-16 DIAGNOSIS — E871 Hypo-osmolality and hyponatremia: Secondary | ICD-10-CM | POA: Diagnosis not present

## 2022-07-16 DIAGNOSIS — J441 Chronic obstructive pulmonary disease with (acute) exacerbation: Secondary | ICD-10-CM | POA: Diagnosis not present

## 2022-07-20 DIAGNOSIS — J449 Chronic obstructive pulmonary disease, unspecified: Secondary | ICD-10-CM | POA: Diagnosis not present

## 2022-07-20 DIAGNOSIS — R5383 Other fatigue: Secondary | ICD-10-CM | POA: Diagnosis not present

## 2022-07-20 DIAGNOSIS — R7309 Other abnormal glucose: Secondary | ICD-10-CM | POA: Diagnosis not present

## 2022-07-20 DIAGNOSIS — Z1231 Encounter for screening mammogram for malignant neoplasm of breast: Secondary | ICD-10-CM | POA: Diagnosis not present

## 2022-07-20 DIAGNOSIS — E785 Hyperlipidemia, unspecified: Secondary | ICD-10-CM | POA: Diagnosis not present

## 2022-07-20 DIAGNOSIS — I10 Essential (primary) hypertension: Secondary | ICD-10-CM | POA: Diagnosis not present

## 2022-07-20 DIAGNOSIS — F0390 Unspecified dementia without behavioral disturbance: Secondary | ICD-10-CM | POA: Diagnosis not present

## 2022-07-20 DIAGNOSIS — M818 Other osteoporosis without current pathological fracture: Secondary | ICD-10-CM | POA: Diagnosis not present

## 2022-07-22 DIAGNOSIS — E785 Hyperlipidemia, unspecified: Secondary | ICD-10-CM | POA: Diagnosis not present

## 2022-07-22 DIAGNOSIS — J441 Chronic obstructive pulmonary disease with (acute) exacerbation: Secondary | ICD-10-CM | POA: Diagnosis not present

## 2022-07-22 DIAGNOSIS — E871 Hypo-osmolality and hyponatremia: Secondary | ICD-10-CM | POA: Diagnosis not present

## 2022-07-22 DIAGNOSIS — I10 Essential (primary) hypertension: Secondary | ICD-10-CM | POA: Diagnosis not present

## 2022-07-22 DIAGNOSIS — Z8572 Personal history of non-Hodgkin lymphomas: Secondary | ICD-10-CM | POA: Diagnosis not present

## 2022-07-22 DIAGNOSIS — B974 Respiratory syncytial virus as the cause of diseases classified elsewhere: Secondary | ICD-10-CM | POA: Diagnosis not present

## 2022-07-22 DIAGNOSIS — F32A Depression, unspecified: Secondary | ICD-10-CM | POA: Diagnosis not present

## 2022-07-22 DIAGNOSIS — J9601 Acute respiratory failure with hypoxia: Secondary | ICD-10-CM | POA: Diagnosis not present

## 2022-07-22 DIAGNOSIS — F0393 Unspecified dementia, unspecified severity, with mood disturbance: Secondary | ICD-10-CM | POA: Diagnosis not present

## 2022-07-24 DIAGNOSIS — R7309 Other abnormal glucose: Secondary | ICD-10-CM | POA: Diagnosis not present

## 2022-07-24 DIAGNOSIS — E785 Hyperlipidemia, unspecified: Secondary | ICD-10-CM | POA: Diagnosis not present

## 2022-07-24 DIAGNOSIS — R5383 Other fatigue: Secondary | ICD-10-CM | POA: Diagnosis not present

## 2022-07-24 DIAGNOSIS — E559 Vitamin D deficiency, unspecified: Secondary | ICD-10-CM | POA: Diagnosis not present

## 2022-08-06 ENCOUNTER — Ambulatory Visit: Payer: Medicare HMO | Admitting: Nurse Practitioner

## 2022-09-22 DIAGNOSIS — G609 Hereditary and idiopathic neuropathy, unspecified: Secondary | ICD-10-CM | POA: Diagnosis not present

## 2022-09-22 DIAGNOSIS — R413 Other amnesia: Secondary | ICD-10-CM | POA: Diagnosis not present

## 2022-09-30 DIAGNOSIS — R413 Other amnesia: Secondary | ICD-10-CM | POA: Diagnosis not present

## 2022-10-06 DIAGNOSIS — Z515 Encounter for palliative care: Secondary | ICD-10-CM | POA: Diagnosis not present

## 2022-10-06 DIAGNOSIS — M6281 Muscle weakness (generalized): Secondary | ICD-10-CM | POA: Diagnosis not present

## 2022-10-06 DIAGNOSIS — G309 Alzheimer's disease, unspecified: Secondary | ICD-10-CM | POA: Diagnosis not present

## 2022-10-13 DIAGNOSIS — H109 Unspecified conjunctivitis: Secondary | ICD-10-CM | POA: Diagnosis not present

## 2022-10-13 DIAGNOSIS — H1031 Unspecified acute conjunctivitis, right eye: Secondary | ICD-10-CM | POA: Diagnosis not present

## 2022-10-19 DIAGNOSIS — M818 Other osteoporosis without current pathological fracture: Secondary | ICD-10-CM | POA: Diagnosis not present

## 2022-10-19 DIAGNOSIS — F0390 Unspecified dementia without behavioral disturbance: Secondary | ICD-10-CM | POA: Diagnosis not present

## 2022-10-19 DIAGNOSIS — B309 Viral conjunctivitis, unspecified: Secondary | ICD-10-CM | POA: Diagnosis not present

## 2022-10-19 DIAGNOSIS — E785 Hyperlipidemia, unspecified: Secondary | ICD-10-CM | POA: Diagnosis not present

## 2022-10-19 DIAGNOSIS — J449 Chronic obstructive pulmonary disease, unspecified: Secondary | ICD-10-CM | POA: Diagnosis not present

## 2022-10-19 DIAGNOSIS — I10 Essential (primary) hypertension: Secondary | ICD-10-CM | POA: Diagnosis not present

## 2022-10-26 DIAGNOSIS — Z515 Encounter for palliative care: Secondary | ICD-10-CM | POA: Diagnosis not present

## 2022-10-26 DIAGNOSIS — G309 Alzheimer's disease, unspecified: Secondary | ICD-10-CM | POA: Diagnosis not present

## 2022-10-26 DIAGNOSIS — H1033 Unspecified acute conjunctivitis, bilateral: Secondary | ICD-10-CM | POA: Diagnosis not present

## 2022-11-13 ENCOUNTER — Ambulatory Visit: Payer: Medicare HMO | Admitting: Internal Medicine

## 2023-04-07 DEATH — deceased
